# Patient Record
Sex: Female | Born: 1945 | Race: White | Hispanic: No | Marital: Single | Smoking: Never smoker
Health system: Southern US, Community
[De-identification: ages and names within clinical notes are randomized; demographics above are authoritative.]

## PROBLEM LIST (undated history)

## (undated) DIAGNOSIS — I4891 Unspecified atrial fibrillation: Secondary | ICD-10-CM

## (undated) DIAGNOSIS — C801 Malignant (primary) neoplasm, unspecified: Secondary | ICD-10-CM

## (undated) DIAGNOSIS — E7800 Pure hypercholesterolemia, unspecified: Secondary | ICD-10-CM

## (undated) DIAGNOSIS — K219 Gastro-esophageal reflux disease without esophagitis: Secondary | ICD-10-CM

## (undated) DIAGNOSIS — G2581 Restless legs syndrome: Secondary | ICD-10-CM

## (undated) DIAGNOSIS — N83209 Unspecified ovarian cyst, unspecified side: Secondary | ICD-10-CM

## (undated) DIAGNOSIS — Z9889 Other specified postprocedural states: Secondary | ICD-10-CM

## (undated) DIAGNOSIS — Z923 Personal history of irradiation: Secondary | ICD-10-CM

## (undated) DIAGNOSIS — R112 Nausea with vomiting, unspecified: Secondary | ICD-10-CM

## (undated) DIAGNOSIS — E079 Disorder of thyroid, unspecified: Secondary | ICD-10-CM

## (undated) DIAGNOSIS — I10 Essential (primary) hypertension: Secondary | ICD-10-CM

## (undated) DIAGNOSIS — F419 Anxiety disorder, unspecified: Secondary | ICD-10-CM

## (undated) HISTORY — DX: Disorder of thyroid, unspecified: E07.9

## (undated) HISTORY — PX: APPENDECTOMY: SHX54

## (undated) HISTORY — PX: TONSILLECTOMY: SUR1361

## (undated) HISTORY — DX: Unspecified ovarian cyst, unspecified side: N83.209

## (undated) HISTORY — DX: Unspecified atrial fibrillation: I48.91

## (undated) HISTORY — PX: OOPHORECTOMY: SHX86

---

## 1986-05-16 HISTORY — PX: TOTAL ABDOMINAL HYSTERECTOMY: SHX209

## 1999-03-05 ENCOUNTER — Encounter: Admission: RE | Admit: 1999-03-05 | Discharge: 1999-03-05 | Payer: Self-pay | Admitting: Obstetrics and Gynecology

## 1999-03-05 ENCOUNTER — Encounter: Payer: Self-pay | Admitting: Obstetrics and Gynecology

## 2000-03-06 ENCOUNTER — Encounter: Payer: Self-pay | Admitting: Obstetrics and Gynecology

## 2000-03-06 ENCOUNTER — Encounter: Admission: RE | Admit: 2000-03-06 | Discharge: 2000-03-06 | Payer: Self-pay | Admitting: Obstetrics and Gynecology

## 2000-12-04 ENCOUNTER — Ambulatory Visit (HOSPITAL_COMMUNITY): Admission: RE | Admit: 2000-12-04 | Discharge: 2000-12-04 | Payer: Self-pay | Admitting: Gastroenterology

## 2000-12-04 ENCOUNTER — Encounter (INDEPENDENT_AMBULATORY_CARE_PROVIDER_SITE_OTHER): Payer: Self-pay | Admitting: *Deleted

## 2001-04-05 ENCOUNTER — Encounter: Admission: RE | Admit: 2001-04-05 | Discharge: 2001-04-05 | Payer: Self-pay | Admitting: Obstetrics and Gynecology

## 2001-04-05 ENCOUNTER — Encounter: Payer: Self-pay | Admitting: Obstetrics and Gynecology

## 2001-09-05 ENCOUNTER — Encounter: Admission: RE | Admit: 2001-09-05 | Discharge: 2001-09-05 | Payer: Self-pay | Admitting: Surgery

## 2001-09-05 ENCOUNTER — Encounter: Payer: Self-pay | Admitting: Surgery

## 2002-08-21 ENCOUNTER — Encounter: Payer: Self-pay | Admitting: Obstetrics and Gynecology

## 2002-08-21 ENCOUNTER — Encounter: Admission: RE | Admit: 2002-08-21 | Discharge: 2002-08-21 | Payer: Self-pay | Admitting: Obstetrics and Gynecology

## 2003-09-25 ENCOUNTER — Ambulatory Visit (HOSPITAL_COMMUNITY): Admission: RE | Admit: 2003-09-25 | Discharge: 2003-09-25 | Payer: Self-pay | Admitting: Family Medicine

## 2003-10-08 ENCOUNTER — Encounter (HOSPITAL_COMMUNITY): Admission: RE | Admit: 2003-10-08 | Discharge: 2003-10-09 | Payer: Self-pay | Admitting: Family Medicine

## 2004-05-06 ENCOUNTER — Encounter: Admission: RE | Admit: 2004-05-06 | Discharge: 2004-05-06 | Payer: Self-pay | Admitting: Obstetrics and Gynecology

## 2005-11-08 ENCOUNTER — Encounter: Admission: RE | Admit: 2005-11-08 | Discharge: 2005-11-08 | Payer: Self-pay | Admitting: Obstetrics and Gynecology

## 2006-12-04 ENCOUNTER — Encounter: Admission: RE | Admit: 2006-12-04 | Discharge: 2006-12-04 | Payer: Self-pay | Admitting: Obstetrics and Gynecology

## 2007-05-17 HISTORY — PX: CHOLECYSTECTOMY: SHX55

## 2007-09-14 ENCOUNTER — Emergency Department (HOSPITAL_COMMUNITY): Admission: EM | Admit: 2007-09-14 | Discharge: 2007-09-14 | Payer: Self-pay | Admitting: Emergency Medicine

## 2007-11-09 ENCOUNTER — Ambulatory Visit (HOSPITAL_COMMUNITY): Admission: RE | Admit: 2007-11-09 | Discharge: 2007-11-09 | Payer: Self-pay | Admitting: Surgery

## 2007-11-09 ENCOUNTER — Encounter (INDEPENDENT_AMBULATORY_CARE_PROVIDER_SITE_OTHER): Payer: Self-pay | Admitting: Surgery

## 2008-01-31 ENCOUNTER — Ambulatory Visit (HOSPITAL_COMMUNITY): Admission: RE | Admit: 2008-01-31 | Discharge: 2008-01-31 | Payer: Self-pay | Admitting: Family Medicine

## 2008-02-12 ENCOUNTER — Encounter: Admission: RE | Admit: 2008-02-12 | Discharge: 2008-02-12 | Payer: Self-pay | Admitting: Family Medicine

## 2008-03-27 ENCOUNTER — Ambulatory Visit (HOSPITAL_COMMUNITY): Admission: RE | Admit: 2008-03-27 | Discharge: 2008-03-27 | Payer: Self-pay | Admitting: Family Medicine

## 2008-09-01 ENCOUNTER — Ambulatory Visit (HOSPITAL_COMMUNITY): Admission: RE | Admit: 2008-09-01 | Discharge: 2008-09-01 | Payer: Self-pay | Admitting: Family Medicine

## 2009-03-05 ENCOUNTER — Ambulatory Visit (HOSPITAL_COMMUNITY): Admission: RE | Admit: 2009-03-05 | Discharge: 2009-03-05 | Payer: Self-pay | Admitting: Family Medicine

## 2009-03-16 ENCOUNTER — Encounter (HOSPITAL_COMMUNITY): Admission: RE | Admit: 2009-03-16 | Discharge: 2009-04-15 | Payer: Self-pay | Admitting: Orthopedic Surgery

## 2009-03-31 ENCOUNTER — Encounter: Admission: RE | Admit: 2009-03-31 | Discharge: 2009-03-31 | Payer: Self-pay | Admitting: Obstetrics and Gynecology

## 2009-04-16 ENCOUNTER — Encounter (HOSPITAL_COMMUNITY): Admission: RE | Admit: 2009-04-16 | Discharge: 2009-05-13 | Payer: Self-pay | Admitting: Orthopedic Surgery

## 2009-05-18 ENCOUNTER — Encounter (HOSPITAL_COMMUNITY): Admission: RE | Admit: 2009-05-18 | Discharge: 2009-06-17 | Payer: Self-pay | Admitting: Orthopedic Surgery

## 2010-04-01 ENCOUNTER — Encounter: Admission: RE | Admit: 2010-04-01 | Discharge: 2010-04-01 | Payer: Self-pay | Admitting: Obstetrics and Gynecology

## 2010-09-28 NOTE — Consult Note (Signed)
[name], [name] NO.:  [id number]   MEDICAL RECORD NO.:  [id number]          PATIENT TYPE:  EMS   LOCATION:  MAJO                         FACILITY:  [facility]   PHYSICIAN:  Gabrielle Dare. Janee Morn, M.D.DATE OF BIRTH:  08/03/1945   DATE OF CONSULTATION:  09/14/2007  DATE OF DISCHARGE:  09/14/2007                                 CONSULTATION   REASON FOR CONSULTATION:  Gallstone.   HISTORY OF PRESENT ILLNESS:  [name] is a very pleasant 65 year old  white female who is well known to Dr. Cyndia Bent from our practice  with a history of gallstones for about several years.  She developed  right lower quadrant abdominal pain earlier this morning.  It resolved  around at the time of her arrival to the hospital, but she underwent  further evaluation.  Ultrasound showed some gallstones and some  gallbladder wall thickening, and we are asked to evaluate her.  Since  that time, she has had no recurrence of her pain or whatsoever, and she  is hoping to be able to go home.   PAST MEDICAL HISTORY:  Hypothyroidism.   PAST SURGICAL HISTORY:  Hysterectomy and tonsillectomy.   SOCIAL HISTORY:  She does not smoke.   ALLERGIES:  No known drug allergies.   MEDICATIONS:  Synthroid, Premarin, and vitamins.   REVIEW OF SYSTEMS:  GI:  Currently is negative, previously she had pain  in the right lower quadrant and the subcostal region.  CARDIAC:  Negative.  PULMONARY:  Negative.  GU:  Negative.  MUSCULOSKELETAL:  Negative.  NEUROPSYCHIATRIC:  Negative.  Remainder of the reviews systems are unremarkable.   PHYSICAL EXAMINATION:  VITAL SIGNS:  Temperature 97.3, pulse 80,  respirations 16, and blood pressure 119/70.  GENERAL:  She is awake and alert.  She appears well.  She is in no  distress.  HEENT:  Pupils are equal.  Sclerae clear with no icterus.  Oral mucosa  is moist.  NECK:  Supple with no tenderness or masses felt.  LUNGS:  Clear to auscultation.  No wheezing is  heard.  CARDIAC:  Heart is regular.  No murmurs are present, impulses are  palpable on left chest.  ABDOMEN:  Soft and nontender.  There is no right upper quadrant  tenderness whatsoever.  No masses are felt.  Bowel sounds are active.  EXTREMITIES:  Warm with no peripheral edema.  SKIN:  Dry with no rashes.  NEUROLOGIC:  The patient follows commands and moves all extremities  without noted deficits.   DATA REVIEWED:  Ultrasound with results as above.  Urinalysis is  negative.  Hemoglobin 12.9, and white blood cell count 12.7.  Basic  metabolic profile unremarkable with exception of glucose of 116, AST  109, ALT 47, alkaline phosphatase 51, and bilirubin 0.7.   IMPRESSION:  Likely, biliary colic with pain now completely resolved.  The patient wants to go home.  I feel this will be safe.   PLAN:  The plan will be for her to be on the low-fat diet, and she will  make an appointment to follow up with Dr. Jamey Ripa next week.  I gave her  a card, and she has agreed to call us, if any of the pain returns  tonight or in the interim before she sees Dr. Jamey Ripa.  I feel she will  need to go on and have an elective cholecystectomy.  Plan was discussed  in detail with the patient.  Questions were answered.      Gabrielle Dare Janee Morn, M.D.  Electronically Signed     BET/MEDQ  D:  09/14/2007  T:  09/15/2007  Job:  [id number]

## 2010-09-28 NOTE — Op Note (Signed)
NAMENIKKY, DUBA NO.:  [id number]   MEDICAL RECORD NO.:  [id number]          PATIENT TYPE:  AMB   LOCATION:  DAY                          FACILITY:  [facility]   PHYSICIAN:  Currie Paris, M.D.DATE OF BIRTH:  [birth year]   DATE OF PROCEDURE:  11/09/2007  DATE OF DISCHARGE:                               OPERATIVE REPORT   OFFICE MEDICAL RECORD NUMBER:  [id number].   PREOPERATIVE DIAGNOSIS:  Chronic calculus cholecystitis.   POSTOPERATIVE DIAGNOSIS:  Chronic calculus cholecystitis.   OPERATION:  Laparoscopic cholecystectomy, with operative cholangiogram.   SURGEON:  Currie Paris, M.D.   ASSISTANT:  Angelia Mould. Derrell Lolling, M.D.   ANESTHESIA:  General endotracheal.   CLINICAL HISTORY:  This is a 65 year old lady with biliary type symptoms  and multiple known gallstones.  She elected to proceed to  cholecystectomy.   DESCRIPTION OF PROCEDURE:  The patient was seen in the holding area, and  she had no further questions.  We confirmed that cholecystectomy was the  planned procedure.   The patient was taken to the operating room, and after satisfactory  general endotracheal anesthesia had been obtained, the abdomen was  prepped and draped.  The time-out was performed.   I used 0.25% plain Marcaine for each of the incisions.  The umbilical  incision was made, the fascia identified and opened, and the peritoneal  cavity entered under direct vision.  A pursestring was placed, the  Hasson introduced, and the abdomen insufflated to 15.   The patient was placed in reverse Trendelenburg and tilted to the left.  The epigastric trocar using a 10/11 and two 5 mm trocar skin for the  lateral trocars were placed under direct vision.   The gallbladder was contracted around what appeared to be some large  stones.  It was retracted over the liver, and the peritoneum around the  triangle of Calot opened, and I made a large window and identified a  long segment of  cystic duct with its junction with the gallbladder as  well as a segment of the cystic artery.  The cystic duct and artery were  clipped and the cystic duct opened near the junction with the  gallbladder.  A Cook catheter was introduced for cholangiography.  While  waiting for x-ray, I went ahead and divided the artery, and I began to  remove the gallbladder, and we had a nice plane of dissection with  almost a mesentery.  Once radiology came in, we went ahead with  operative cholangiogram, and this appeared to be basically normal, with  good flow the duodenum, good filling of the hepatic radicals, and no  filling defects.  There did appear to be a couple of slightly narrowed  areas, but there was no tapering to suggest a tumor or malignant  obstruction.  I reviewed those with the radiologist verbally.   The cystic catheter was then removed, and three clips placed on the stay  side of the cystic duct.  It was divided.  The gallbladder was then  completely dissected off and removed from its bed and placed in the bag.  I  irrigated it to make sure everything was dry.  The gallbladder was  brought out the umbilical port.  I made a final irrigation, and then we  closed the umbilical port with a pursestring.  The abdomen was deflated  through the epigastric port after removing the two lateral trocars.  The  skin was closed with 4-0 Monocryl subcuticular plus Dermabond.   The patient tolerated the procedure well, and there were no operative  complications.  All counts were correct.      Currie Paris, M.D.  Electronically Signed     CJS/MEDQ  D:  11/09/2007  T:  11/09/2007  Job:  [id number]   cc:   Patrica Duel, M.D.  Fax: [phone]

## 2010-10-01 NOTE — Procedures (Signed)
[place]  Patient:    [name], [name]                     MRN: [id number] Proc. Date: 12/04/00 Attending:  Petra Kuba, M.D. CC:         Patrica Duel, MD  S. Kyra Manges, M.D.   Procedure Report  PROCEDURE PERFORMED:  Colonoscopy with polypectomy.  ENDOSCOPIST:  Petra Kuba, M.D.  INDICATIONS FOR PROCEDURE:  Colonic screening.  Consent was signed after risks, benefits, methods, and options were thoroughly discussed in the office.  MEDICATIONS USED:  Demerol 55 mg, Versed 7 mg.  DESCRIPTION OF PROCEDURE:  Rectal inspection was pertinent for small external hemorrhoids.  Digital exam was negative.  Pediatric video colonoscope was inserted and fairly easily advanced around the colon to the cecum.  This did require rolling her on her back and some abdominal pressure.  On insertion, other than some left-sided  diverticula, no abnormalities were seen.  The cecum was identified by the appendiceal orifice and the ileocecal valve.  The scope was slowly withdrawn.  The prep was adequate there was some liquid stool that require washing and suctioning.  On slow withdrawal through the colon the cecum was normal.  In the more distal ascending, a small 2 mm polyp was seen and was hot biopsied x 1.  The scope was further withdrawn.  No other abnormalities were seen other than some left-sided diverticula as we slowly withdrew back to the rectum.  Once back in the rectum, the scope was retroflexed pertinent for some internal hemorrhoids.  The scope was straightened and readvanced a short ways up the sigmoid.  Air was suctioned, scope removed.  The patient tolerated the procedure well.  There was no obvious immediate complication.  ENDOSCOPIC DIAGNOSIS: 1. Internal and external hemorrhoids. 2. Left-sided diverticula. 3. Once small ascending polyp hot biopsied. 4. Otherwise within normal limits to the cecum.  PLAN:  Await pathology but probably  recheck colon screening in five years.  GI follow-up p.r.n. otherwise return care to Dr. Nobie Putnam and Elana Alm for the customary health care maintenance to include yearly rectals and guaiacs. DD:  12/04/00 TD:  12/04/00 Job: [id number]

## 2011-02-10 LAB — DIFFERENTIAL
Basophils Absolute: 0
Basophils Relative: 0
Lymphs Abs: 1.4
Monocytes Relative: 9
Neutrophils Relative %: 61

## 2011-02-10 LAB — URINALYSIS, ROUTINE W REFLEX MICROSCOPIC
Bilirubin Urine: NEGATIVE
Ketones, ur: NEGATIVE
Nitrite: NEGATIVE
Protein, ur: NEGATIVE
Specific Gravity, Urine: 1.005
pH: 6.5

## 2011-02-10 LAB — COMPREHENSIVE METABOLIC PANEL
AST: 23
Chloride: 106
Creatinine, Ser: 0.79
GFR calc Af Amer: >60
GFR calc non Af Amer: >60
Potassium: 3.7
Total Bilirubin: 0.8

## 2011-02-10 LAB — CBC
HCT: 35 — ABNORMAL LOW
Platelets: 214
RDW: 12.4
WBC: 4.8

## 2011-03-10 ENCOUNTER — Other Ambulatory Visit: Payer: Self-pay | Admitting: Internal Medicine

## 2011-03-10 DIAGNOSIS — Z1231 Encounter for screening mammogram for malignant neoplasm of breast: Secondary | ICD-10-CM

## 2011-03-12 ENCOUNTER — Encounter: Payer: Self-pay | Admitting: *Deleted

## 2011-03-12 ENCOUNTER — Emergency Department (HOSPITAL_COMMUNITY)
Admission: EM | Admit: 2011-03-12 | Discharge: 2011-03-12 | Disposition: A | Discharge status: Home / Self Care | Payer: BC Managed Care – PPO | Attending: Emergency Medicine | Admitting: Emergency Medicine

## 2011-03-12 DIAGNOSIS — S91309A Unspecified open wound, unspecified foot, initial encounter: Secondary | ICD-10-CM | POA: Insufficient documentation

## 2011-03-12 DIAGNOSIS — S2000XA Contusion of breast, unspecified breast, initial encounter: Secondary | ICD-10-CM

## 2011-03-12 DIAGNOSIS — S91319A Laceration without foreign body, unspecified foot, initial encounter: Secondary | ICD-10-CM

## 2011-03-12 DIAGNOSIS — W01119A Fall on same level from slipping, tripping and stumbling with subsequent striking against unspecified sharp object, initial encounter: Secondary | ICD-10-CM | POA: Insufficient documentation

## 2011-03-12 DIAGNOSIS — W268XXA Contact with other sharp object(s), not elsewhere classified, initial encounter: Secondary | ICD-10-CM | POA: Insufficient documentation

## 2011-03-12 MED ORDER — LIDOCAINE HCL (PF) 1 % IJ SOLN
INTRAMUSCULAR | Status: AC
  Filled 2011-03-12: qty 5

## 2011-03-12 MED ORDER — LIDOCAINE-EPINEPHRINE 2 %-1:100000 IJ SOLN: 20.0000 mL | Freq: Once | INTRAMUSCULAR | Status: DC

## 2011-03-12 MED ORDER — LIDOCAINE-EPINEPHRINE (PF) 1 %-1:200000 IJ SOLN
INTRAMUSCULAR | Status: AC
  Administered 2011-03-12: 17:00:00
  Filled 2011-03-12: qty 10

## 2011-03-12 NOTE — ED Notes (Signed)
Dressing applied. 

## 2011-03-12 NOTE — ED Notes (Signed)
Pt states she cut her foot on the metal of the floor vent in her house.

## 2011-03-12 NOTE — ED Provider Notes (Signed)
Scribed for [name], MD, the patient was seen in room APA10/APA10 . This chart was scribed by Ellie Lunch.   CSN: [id number] Arrival date & time: 03/12/2011  3:53 PM   First MD Initiated Contact with Patient 03/12/11 1607      Chief Complaint  Patient presents with  . Extremity Laceration    (Consider location/radiation/quality/duration/timing/severity/associated sxs/prior treatment) HPI [name] is a 65 y.o. female who presents to the Emergency Department complaining of left foot laceration. Pt reports she was working on her floors when she accidentally stepped into an uncovered floor vent ~ 2 hours ago. Pt says she cut the bottom of her left foot on the metal part of the floor vent when she slipped. Pt rates pain at site of laceration 2/10 in severity. Pt denies any pain to ankle, knee, back. Denies any weakness or dizziness.  Last tetanus ~ 3 years ago.   History reviewed. No pertinent past medical history.  Past Surgical History  Procedure Date  . Abdominal hysterectomy   . Tonsillectomy   . Cholecystectomy     History reviewed. No pertinent family history.  History  Substance Use Topics  . Smoking status: Never Smoker   . Smokeless tobacco: Not on file  . Alcohol Use: No    Review of Systems  HENT: Negative for neck pain.   Musculoskeletal: Negative for back pain.  Skin: Positive for wound (laceration to bottom of left foot).  Neurological: Negative for dizziness and weakness.  All other systems reviewed and are negative.   Allergies  Review of patient's allergies indicates no known allergies.  Home Medications   Current Outpatient Rx  Name Route Sig Dispense Refill  . CALCIUM CARBONATE-VITAMIN D 500-200 MG-UNIT PO TABS Oral Take 1 tablet by mouth daily.      Marland Kitchen ESTROGENS CONJUGATED 0.625 MG PO TABS Oral Take 0.625 mg by mouth every other day.      Marland Kitchen LEVOTHYROXINE SODIUM 75 MCG PO TABS Oral Take 75 mcg by mouth daily.      .  MULTI-VITAMIN/MINERALS PO TABS Oral Take 1 tablet by mouth daily.      Marland Kitchen FISH OIL 1200 MG PO CAPS Oral Take 1 capsule by mouth daily.        BP 141/50  Pulse 112  Temp(Src) 98.2 F (36.8 C) (Oral)  Resp 20  Ht 5\' 7"  (1.702 m)  Wt 130 lb (58.968 kg)  BMI 20.36 kg/m2  SpO2 100%  Physical Exam  Nursing note and vitals reviewed. Constitutional: She is oriented to person, place, and time. She appears well-developed and well-nourished.  HENT:  Head: Normocephalic and atraumatic.  Eyes: Conjunctivae and EOM are normal.  Neck: Normal range of motion. Neck supple.  Cardiovascular: Regular rhythm and normal heart sounds.        Borderline tachycardia  Pulmonary/Chest: Effort normal and breath sounds normal. No respiratory distress. She exhibits no tenderness.       No bruising, abrasion, or nipple drainage on  right breast.  No deformity noted to right posterior ribs.   Abdominal: Soft. There is no tenderness.  Musculoskeletal: Normal range of motion. She exhibits no tenderness.       Spine non tender. Full ROM BLE.  3.5 cm laceration to plantar aspect of left foot between 1st metacarpal and phalangeal joint.  Neurological: She is alert and oriented to person, place, and time.  Skin: Skin is warm and dry.  Psychiatric: She has a normal mood and affect.  ED Course  Procedures (including critical care time)  LACERATION REPAIR PROCEDURE NOTE The patient's identification was confirmed and consent was obtained. This procedure was performed by [name], MD at 4:22 PM. Site: plantar surface of left foot Sterile procedures observed Anesthetic used (type and amt): 3 ml Lidocaine-Epinephrine 1% Suture type/size: 4.0 Prolene Length:3.5cm # of Sutures: 3 Technique:interrupted  Complexity simple Antibx ointment applied Tetanus UTD Site anesthetized, irrigated with NS, explored without evidence of foreign body, wound well approximated, site covered with dry, sterile dressing.   Patient tolerated procedure well without complications. Instructions for care discussed verbally and patient provided with additional written instructions for homecare and f/u.   OTHER DATA REVIEWED: Nursing notes, vital signs reviewed.  DIAGNOSTIC STUDIES: Oxygen Saturation is 100% on room air, normal by my interpretation.    ED MEDICATIONS Medications  lidocaine-EPINEPHrine (XYLOCAINE-EPINEPHrine) 1 %-1:200000 (with pres) injection (   Given by Other 03/12/11 1630)   4:22 EDP at PT bedside to suture laceration. EDP collected additional history. Pt states she hit her right breast when she slipped into the vent. EDP examined right breast and found no bruising, abrasion, or nipple drainage from right breast. No deformity noted to ribs.   1. Laceration of foot   2. Contusion of breast      MDM  Accidental fall with contusion to right breast and laceration left foot. No serious injury.   I personally performed the services described in this documentation, which was scribed in my presence. The recorded information has been reviewed and considered.         [name], MD 03/12/11 [id number]

## 2011-04-05 ENCOUNTER — Ambulatory Visit
Admission: RE | Admit: 2011-04-05 | Discharge: 2011-04-05 | Disposition: A | Discharge status: Home / Self Care | Payer: BC Managed Care – PPO | Source: Ambulatory Visit | Attending: Internal Medicine | Admitting: Internal Medicine

## 2011-04-05 DIAGNOSIS — Z1231 Encounter for screening mammogram for malignant neoplasm of breast: Secondary | ICD-10-CM

## 2011-06-16 DIAGNOSIS — E049 Nontoxic goiter, unspecified: Secondary | ICD-10-CM | POA: Diagnosis not present

## 2011-09-15 DIAGNOSIS — E049 Nontoxic goiter, unspecified: Secondary | ICD-10-CM | POA: Diagnosis not present

## 2011-09-16 ENCOUNTER — Ambulatory Visit (INDEPENDENT_AMBULATORY_CARE_PROVIDER_SITE_OTHER): Payer: Medicare Other | Admitting: Gynecology

## 2011-09-16 ENCOUNTER — Encounter: Payer: Self-pay | Admitting: Gynecology

## 2011-09-16 VITALS — BP 130/80 | Ht 67.5 in | Wt 128.0 lb

## 2011-09-16 DIAGNOSIS — Z79890 Hormone replacement therapy (postmenopausal): Secondary | ICD-10-CM | POA: Diagnosis not present

## 2011-09-16 DIAGNOSIS — R82998 Other abnormal findings in urine: Secondary | ICD-10-CM | POA: Diagnosis not present

## 2011-09-16 DIAGNOSIS — E039 Hypothyroidism, unspecified: Secondary | ICD-10-CM | POA: Insufficient documentation

## 2011-09-16 DIAGNOSIS — N952 Postmenopausal atrophic vaginitis: Secondary | ICD-10-CM

## 2011-09-16 MED ORDER — FLUCONAZOLE 150 MG PO TABS: 150.0000 mg | ORAL_TABLET | Freq: Once | ORAL | Status: AC

## 2011-09-16 MED ORDER — ESTROGENS CONJUGATED 0.625 MG PO TABS: 0.6250 mg | ORAL_TABLET | ORAL | Status: DC

## 2011-09-16 NOTE — Patient Instructions (Signed)
Follow up in one year for annual exam 

## 2011-09-16 NOTE — Progress Notes (Signed)
AGAPE HARDIMAN 05/09/1946 [id number]        66 y.o.  G0 new patient for follow up. Several issues noted below. Former patient of Dr. Leota Sauers.  Past medical history,surgical history, medications, allergies, family history and social history were all reviewed and documented in the EPIC chart. ROS:  Was performed and pertinent positives and negatives are included in the history.  Exam: Kim chaperone present Filed Vitals:   09/16/11 1008  BP: 130/80   General appearance  Normal Skin grossly normal Head/Neck normal with no cervical or supraclavicular adenopathy thyroid normal Lungs  clear Cardiac RR, without RMG Abdominal  soft, nontender, without masses, organomegaly or hernia Breasts  examined lying and sitting without masses, retractions, discharge or axillary adenopathy. Pelvic  Ext/BUS/vagina  normal with atrophic genital changes. Admits one finger.  Adnexa  Without masses or tenderness    Anus and perineum  normal   Rectovaginal  normal sphincter tone without palpated masses or tenderness.    Assessment/Plan:  66 y.o. female for annual exam.    1. ERT. Patient is on Premarin 0.625 taking it every other day. She actually had been off of it for the past week as she ran out and is doing well. She was started after her hysterectomy at age 6 has continued since then.  I reviewed the WHI study, increased risk of stroke heart attack DVT possible increased risk of breast cancer. The ACOG and NAMS statements for the lowest dose for the shortest period of time discussed. The advantages of transdermal/first pass effect issues reviewed. After lengthy discussion the patient is going to remain off of this but she did ask if I could refill it for the year just in case she would develop symptoms and want to reinitiate accepted the above risks. I refilled her Premarin 0.625 times a year. 2. Atrophic vaginitis. Patient does have fair amount of atrophic changes. Her vagina is somewhat  restrictive admitting one finger. This is not an issue with her and we'll continue to monitor. 3. Pap smear. No Pap smear was done today. She has no history of significant abnormal Pap smears with last Pap smear done January 2012. She's going to bring me records next year for me to review but at this point we'll plan on stopping Pap smears. 4. DEXA. Patient had a DEXA historically 3 years ago and states it was normal. She'll plan on repeating this a 5 year interval. Increase calcium vitamin D reviewed. 5. Colonoscopy. Patient scheduled for colonoscopy this coming June and will follow up for this. 6. Mammography. Patient had her mammography November 2012. She'll continue with annual mammography. SBE monthly reviewed.  7. Diflucan. Patient does get occasional yeast infections following antibiotic use. She asked if I could give her a prescription for Diflucan as Dr. Lelon Perla date and I wrote for Diflucan 150 mg #1 with 2 refills. 8. Health maintenance. No blood work was done today this was all done through her primary physician's office. Assuming she continues well she'll see me in a year, sooner as needed.   Dara Lords MD, 10:49 AM 09/16/2011

## 2011-09-17 LAB — URINALYSIS W MICROSCOPIC + REFLEX CULTURE
Bacteria, UA: NONE SEEN
Bilirubin Urine: NEGATIVE
Casts: NONE SEEN
Crystals: NONE SEEN
Ketones, ur: NEGATIVE mg/dL
Specific Gravity, Urine: 1.007 (ref 1.005–1.030)
Urobilinogen, UA: 0.2 mg/dL (ref 0.0–1.0)

## 2011-09-18 LAB — URINE CULTURE: Colony Count: 4000

## 2011-11-03 DIAGNOSIS — Z09 Encounter for follow-up examination after completed treatment for conditions other than malignant neoplasm: Secondary | ICD-10-CM | POA: Diagnosis not present

## 2011-11-03 DIAGNOSIS — K5730 Diverticulosis of large intestine without perforation or abscess without bleeding: Secondary | ICD-10-CM | POA: Diagnosis not present

## 2011-11-03 DIAGNOSIS — Z86010 Personal history of colonic polyps: Secondary | ICD-10-CM | POA: Diagnosis not present

## 2012-01-12 DIAGNOSIS — E040 Nontoxic diffuse goiter: Secondary | ICD-10-CM | POA: Diagnosis not present

## 2012-02-28 ENCOUNTER — Other Ambulatory Visit: Payer: Self-pay | Admitting: Internal Medicine

## 2012-02-28 DIAGNOSIS — Z1231 Encounter for screening mammogram for malignant neoplasm of breast: Secondary | ICD-10-CM

## 2012-04-10 ENCOUNTER — Ambulatory Visit
Admission: RE | Admit: 2012-04-10 | Discharge: 2012-04-10 | Disposition: A | Discharge status: Home / Self Care | Payer: 59 | Source: Ambulatory Visit | Attending: Internal Medicine | Admitting: Internal Medicine

## 2012-04-10 DIAGNOSIS — Z1231 Encounter for screening mammogram for malignant neoplasm of breast: Secondary | ICD-10-CM

## 2012-04-19 DIAGNOSIS — R5383 Other fatigue: Secondary | ICD-10-CM | POA: Diagnosis not present

## 2012-04-19 DIAGNOSIS — R5381 Other malaise: Secondary | ICD-10-CM | POA: Diagnosis not present

## 2012-04-19 DIAGNOSIS — E040 Nontoxic diffuse goiter: Secondary | ICD-10-CM | POA: Diagnosis not present

## 2012-04-19 DIAGNOSIS — E7800 Pure hypercholesterolemia, unspecified: Secondary | ICD-10-CM | POA: Diagnosis not present

## 2012-05-02 DIAGNOSIS — H25099 Other age-related incipient cataract, unspecified eye: Secondary | ICD-10-CM | POA: Diagnosis not present

## 2012-07-26 DIAGNOSIS — E040 Nontoxic diffuse goiter: Secondary | ICD-10-CM | POA: Diagnosis not present

## 2012-09-21 ENCOUNTER — Ambulatory Visit (INDEPENDENT_AMBULATORY_CARE_PROVIDER_SITE_OTHER): Payer: Medicare Other | Admitting: Gynecology

## 2012-09-21 ENCOUNTER — Encounter: Payer: Self-pay | Admitting: Gynecology

## 2012-09-21 VITALS — BP 114/66 | Ht 66.0 in | Wt 132.0 lb

## 2012-09-21 DIAGNOSIS — N951 Menopausal and female climacteric states: Secondary | ICD-10-CM

## 2012-09-21 DIAGNOSIS — Z79890 Hormone replacement therapy (postmenopausal): Secondary | ICD-10-CM

## 2012-09-21 DIAGNOSIS — N952 Postmenopausal atrophic vaginitis: Secondary | ICD-10-CM | POA: Diagnosis not present

## 2012-09-21 MED ORDER — FLUCONAZOLE 150 MG PO TABS: 150.0000 mg | ORAL_TABLET | Freq: Once | ORAL | Status: DC

## 2012-09-21 MED ORDER — ESTROGENS CONJUGATED 0.625 MG PO TABS: 0.6250 mg | ORAL_TABLET | ORAL | Status: DC

## 2012-09-21 NOTE — Progress Notes (Signed)
[name] Nov 24, 1945 [id number]        67 y.o.  G0P0 for followup exam.  Several issues noted below.  Past medical history,surgical history, medications, allergies, family history and social history were all reviewed and documented in the EPIC chart. ROS:  Was performed and pertinent positives and negatives are included in the history.  Exam: Kim assistant Filed Vitals:   09/21/12 1153  BP: 114/66  Height: 5\' 6"  (1.676 m)  Weight: 132 lb (59.875 kg)   General appearance  Normal Skin grossly normal Head/Neck normal with no cervical or supraclavicular adenopathy thyroid normal Lungs  clear Cardiac RR, without RMG Abdominal  soft, nontender, without masses, organomegaly or hernia Breasts  examined lying and sitting without masses, retractions, discharge or axillary adenopathy. Pelvic  Ext/BUS/vagina  normal with atrophic changes  Adnexa  Without masses or tenderness    Anus and perineum  normal   Rectovaginal  normal sphincter tone without palpated masses or tenderness.    Assessment/Plan:  67 y.o. G0P0 female for followup exam.   1. ERT. Patient taking Premarin 0.625 mg 3 times weekly. Has tried stopping with unacceptable hot flashes. I again discussed the WHI study with increased risk of stroke heart attack DVT and possible breast cancer. The ACOG and NAMS statements per lowest dose for shortess period of time reviewed. Patient understands the issues accepts the risks and wants to continue and I refilled her times a year. 2. Genital atrophy. Patient does have a somewhat restricted vaginal canal that admits one finger in width, normal depth. This is not an issue to her and we'll continue to observe. 3. Vaginitis. Patient does get occasional yeast infections and uses Diflucan 150 mg tablet when necessary. I wrote her for one tablet with 2 refills to use as necessary. 4. Pap smear 2012. No Pap smear done today. No history of significant abnormal Pap smears. Patient is status  post hysterectomy for benign indications and over the age of 17. The options to stop screening altogether versus less frequent screening intervals reviewed. Will readdress on an annual basis. 5. Mammography 03/2012. Patient encouraged to schedule this fall. SBE monthly reviewed. 6. DEXA 3 years ago reported normal. I never got a report of this but she remembers being told it was normal. Plan repeat in another 1-2 years. Increase calcium vitamin D reviewed. 7. Colonoscopy 2013. Followup with their recommended interval. 8. Health maintenance. No blood work done today as it's all done through her primary physician's office. Followup one year, sooner as needed.    Dara Lords MD, 12:22 PM 09/21/2012

## 2012-09-21 NOTE — Patient Instructions (Addendum)
Follow up in one year for annual exam 

## 2012-09-22 LAB — URINALYSIS W MICROSCOPIC + REFLEX CULTURE
Bacteria, UA: NONE SEEN
Bilirubin Urine: NEGATIVE
Glucose, UA: NEGATIVE mg/dL
Protein, ur: NEGATIVE mg/dL
Urobilinogen, UA: 0.2 mg/dL (ref 0.0–1.0)

## 2012-11-01 DIAGNOSIS — E040 Nontoxic diffuse goiter: Secondary | ICD-10-CM | POA: Diagnosis not present

## 2012-11-03 DIAGNOSIS — E039 Hypothyroidism, unspecified: Secondary | ICD-10-CM | POA: Diagnosis not present

## 2012-11-03 DIAGNOSIS — IMO0002 Reserved for concepts with insufficient information to code with codable children: Secondary | ICD-10-CM | POA: Diagnosis not present

## 2012-11-03 DIAGNOSIS — G2581 Restless legs syndrome: Secondary | ICD-10-CM | POA: Diagnosis not present

## 2012-11-08 ENCOUNTER — Telehealth: Payer: Self-pay | Admitting: *Deleted

## 2012-11-08 NOTE — Telephone Encounter (Signed)
Pt never picked up rx given on 09/21/12 premarin 0.625 mg and diflucan 150 mg tablet. Both rx called in I spoke with [facility] pharmacist. I informed pt rx will be ready. If Rx are picked up within a 1 week the pharmacy will put medication back.

## 2012-11-22 DIAGNOSIS — L909 Atrophic disorder of skin, unspecified: Secondary | ICD-10-CM | POA: Diagnosis not present

## 2012-11-22 DIAGNOSIS — L821 Other seborrheic keratosis: Secondary | ICD-10-CM | POA: Diagnosis not present

## 2012-11-22 DIAGNOSIS — B009 Herpesviral infection, unspecified: Secondary | ICD-10-CM | POA: Diagnosis not present

## 2012-11-22 DIAGNOSIS — L919 Hypertrophic disorder of the skin, unspecified: Secondary | ICD-10-CM | POA: Diagnosis not present

## 2012-11-22 DIAGNOSIS — L819 Disorder of pigmentation, unspecified: Secondary | ICD-10-CM | POA: Diagnosis not present

## 2012-12-06 DIAGNOSIS — Z79899 Other long term (current) drug therapy: Secondary | ICD-10-CM | POA: Diagnosis not present

## 2012-12-06 DIAGNOSIS — E038 Other specified hypothyroidism: Secondary | ICD-10-CM | POA: Diagnosis not present

## 2013-02-11 DIAGNOSIS — E039 Hypothyroidism, unspecified: Secondary | ICD-10-CM | POA: Diagnosis not present

## 2013-03-07 ENCOUNTER — Other Ambulatory Visit: Payer: Self-pay

## 2013-03-07 DIAGNOSIS — Z1231 Encounter for screening mammogram for malignant neoplasm of breast: Secondary | ICD-10-CM

## 2013-04-15 ENCOUNTER — Ambulatory Visit
Admission: RE | Admit: 2013-04-15 | Discharge: 2013-04-15 | Disposition: A | Discharge status: Home / Self Care | Payer: Medicare Other | Source: Ambulatory Visit

## 2013-04-15 DIAGNOSIS — Z1231 Encounter for screening mammogram for malignant neoplasm of breast: Secondary | ICD-10-CM | POA: Diagnosis not present

## 2013-04-19 DIAGNOSIS — IMO0002 Reserved for concepts with insufficient information to code with codable children: Secondary | ICD-10-CM | POA: Diagnosis not present

## 2013-04-19 DIAGNOSIS — R3129 Other microscopic hematuria: Secondary | ICD-10-CM | POA: Diagnosis not present

## 2013-04-19 DIAGNOSIS — Z23 Encounter for immunization: Secondary | ICD-10-CM | POA: Diagnosis not present

## 2013-04-19 DIAGNOSIS — E039 Hypothyroidism, unspecified: Secondary | ICD-10-CM | POA: Diagnosis not present

## 2013-04-29 DIAGNOSIS — R319 Hematuria, unspecified: Secondary | ICD-10-CM | POA: Diagnosis not present

## 2013-04-29 DIAGNOSIS — R3129 Other microscopic hematuria: Secondary | ICD-10-CM | POA: Diagnosis not present

## 2013-05-03 DIAGNOSIS — H25099 Other age-related incipient cataract, unspecified eye: Secondary | ICD-10-CM | POA: Diagnosis not present

## 2013-05-23 DIAGNOSIS — E040 Nontoxic diffuse goiter: Secondary | ICD-10-CM | POA: Diagnosis not present

## 2013-07-19 ENCOUNTER — Ambulatory Visit (HOSPITAL_COMMUNITY)
Admission: RE | Admit: 2013-07-19 | Discharge: 2013-07-19 | Disposition: A | Discharge status: Home / Self Care | Payer: Medicare Other | Source: Ambulatory Visit | Attending: Physician Assistant | Admitting: Physician Assistant

## 2013-07-19 ENCOUNTER — Encounter (HOSPITAL_COMMUNITY): Payer: Self-pay

## 2013-07-19 ENCOUNTER — Other Ambulatory Visit (HOSPITAL_COMMUNITY): Payer: Self-pay | Admitting: Physician Assistant

## 2013-07-19 DIAGNOSIS — R1904 Left lower quadrant abdominal swelling, mass and lump: Secondary | ICD-10-CM | POA: Diagnosis not present

## 2013-07-19 DIAGNOSIS — IMO0002 Reserved for concepts with insufficient information to code with codable children: Secondary | ICD-10-CM | POA: Diagnosis not present

## 2013-07-19 DIAGNOSIS — R1907 Generalized intra-abdominal and pelvic swelling, mass and lump: Secondary | ICD-10-CM

## 2013-07-19 DIAGNOSIS — R109 Unspecified abdominal pain: Secondary | ICD-10-CM

## 2013-07-19 DIAGNOSIS — K5730 Diverticulosis of large intestine without perforation or abscess without bleeding: Secondary | ICD-10-CM | POA: Insufficient documentation

## 2013-07-19 DIAGNOSIS — R1031 Right lower quadrant pain: Secondary | ICD-10-CM | POA: Diagnosis not present

## 2013-07-19 MED ORDER — IOHEXOL 300 MG/ML  SOLN
100.0000 mL | Freq: Once | INTRAMUSCULAR | Status: AC | PRN
  Administered 2013-07-19: 100 mL via INTRAVENOUS

## 2013-07-23 ENCOUNTER — Ambulatory Visit (INDEPENDENT_AMBULATORY_CARE_PROVIDER_SITE_OTHER): Payer: Medicare Other | Admitting: Surgery

## 2013-07-23 ENCOUNTER — Encounter (INDEPENDENT_AMBULATORY_CARE_PROVIDER_SITE_OTHER): Payer: Self-pay | Admitting: Surgery

## 2013-07-23 VITALS — BP 138/72 | HR 88 | Temp 98.0°F | Resp 16 | Ht 67.0 in | Wt 133.2 lb

## 2013-07-23 DIAGNOSIS — C494 Malignant neoplasm of connective and soft tissue of abdomen: Secondary | ICD-10-CM | POA: Diagnosis not present

## 2013-07-23 DIAGNOSIS — C44509 Unspecified malignant neoplasm of skin of other part of trunk: Secondary | ICD-10-CM

## 2013-07-23 NOTE — Progress Notes (Signed)
General Surgery [facility]  Chief Complaint  Patient presents with  . New Evaluation    abdominal wall mass - referral from Collene Mares, PA-C, at [place]: Patient is a pleasant 68 year old female referred from her primary care physician's office for evaluation of a lower abdominal mass. Patient had noted some enlargement of her lower abdomen for approximately one month. One week ago she could feel a mass in the lower abdominal wall and began having minor discomfort. She denies fevers or chills. She denies any change in her bowel habits. She presented for evaluation. This included a CT scan of the abdomen and pelvis performed on 07/19/2013. This shows a lobulated mass in the midline of the lower abdomen involving both the right and left rectus musculature measuring 9.7 x 7.9 x 5.5 cm. Radiologist favors sarcoma. Percutaneous biopsy was recommended.  Previous abdominal surgery includes hysterectomy in 1988. Patient was told at that time that she might have ovarian cancer. No further treatment was given. Patient has also had laparoscopic cholecystectomy performed in 2009 by Dr. Margot Chimes.    Past Medical History  Diagnosis Date  . Ovarian cyst   . Thyroid disease     Current Outpatient Prescriptions  Medication Sig Dispense Refill  . Biotin 5000 MCG CAPS Take by mouth.      . Cholecalciferol (VITAMIN D PO) Take by mouth.      . estrogens, conjugated, (PREMARIN) 0.625 MG tablet Take 1 tablet (0.625 mg total) by mouth every other day.  30 tablet  11  . levothyroxine (SYNTHROID, LEVOTHROID) 75 MCG tablet Take 75 mcg by mouth daily.        . Multiple Vitamins-Minerals (MULTIVITAMIN WITH MINERALS) tablet Take 1 tablet by mouth daily.        . Omega-3 Fatty Acids (FISH OIL) 1200 MG CAPS Take 1 capsule by mouth daily.        Marland Kitchen MAGNESIUM PO Take by mouth.      . Probiotic Product (PROBIOTIC PO) Take by mouth.       No current facility-administered  medications for this visit.    No Known Allergies  Family History  Problem Relation Age of Onset  . Heart disease Mother   . Heart disease Sister   . Diabetes Sister   . Cancer Sister     melinoma-skin cancer  . Breast cancer Paternal Grandmother     Age 26's  . Cancer Paternal Grandmother     breast  . Cancer Paternal Grandfather     prostate/bladder    History   Social History  . Marital Status: Single    Spouse Name: N/A    Number of Children: N/A  . Years of Education: N/A   Social History Main Topics  . Smoking status: Never Smoker   . Smokeless tobacco: None  . Alcohol Use: No  . Drug Use: No  . Sexual Activity: No     Comment: HYST   Other Topics Concern  . None   Social History Narrative  . None    REVIEW OF SYSTEMS - PERTINENT POSITIVES ONLY: Denies signs or symptoms of obstruction. Intermittent minor discomfort. Denies fevers or chills.  EXAM: Filed Vitals:   07/23/13 1112  BP: 138/72  Pulse: 88  Temp: 98 F (36.7 C)  Resp: 16    GENERAL: well-developed, well-nourished, no acute distress HEENT: normocephalic; pupils equal and reactive; sclerae clear; dentition good; mucous membranes moist NECK:  No palpable masses  in the thyroid bed; symmetric on extension; no palpable anterior or posterior cervical lymphadenopathy; no supraclavicular masses; no tenderness CHEST: clear to auscultation bilaterally without rales, rhonchi, or wheezes CARDIAC: regular rate and rhythm without significant murmur; peripheral pulses are full ABDOMEN: soft without distension; bowel sounds present; no hepatosplenomegaly; no hernia; firm mass lower midline abdominal wall approximately 10 cm in diameter rising above the muscular plane, mildly tender; no palpable adenopathy in either groin EXT:  non-tender without edema; no deformity NEURO: no gross focal deficits; no sign of tremor   LABORATORY RESULTS: See [facility] ([facility]-Epic) for most recent  results  RADIOLOGY RESULTS: See [facility] ([facility]L-Epic) for most recent results  IMPRESSION: Soft tissue neoplasm of the lower abdominal wall of uncertain behavior, 10 cm  PLAN: I discussed these findings with the patient and her friend who accompanies her today. I have recommended that we proceed with percutaneous needle biopsy in the immediate future. Based on those findings we will arrange for consultation with the appropriate surgeon for resection. I will contact her with the results of the biopsy as soon as they are available.  Patient will be scheduled for percutaneous core needle biopsy by interventional radiology as soon as possible.  Earnstine Regal, MD, [facility]  Primary Care Physician: Glo Herring., MD

## 2013-07-24 ENCOUNTER — Telehealth (INDEPENDENT_AMBULATORY_CARE_PROVIDER_SITE_OTHER): Payer: Self-pay | Admitting: General Surgery

## 2013-07-24 NOTE — Telephone Encounter (Signed)
Pt called to clarify what she was told yesterday in clinic by Dr. Harlow Asa.  States she was slightly caught off-guard and couldn't remember all that he told her.  Reviewed office note and explained needle biopsy to be done and information obtained from it would help dictate the next step for her.  She states appreciation for clarification.

## 2013-07-26 ENCOUNTER — Encounter (HOSPITAL_COMMUNITY): Payer: Self-pay | Admitting: Pharmacy Technician

## 2013-07-26 ENCOUNTER — Other Ambulatory Visit: Payer: Self-pay | Admitting: Radiology

## 2013-07-30 ENCOUNTER — Encounter (HOSPITAL_COMMUNITY): Payer: Self-pay

## 2013-07-30 ENCOUNTER — Ambulatory Visit (HOSPITAL_COMMUNITY)
Admission: RE | Admit: 2013-07-30 | Discharge: 2013-07-30 | Disposition: A | Discharge status: Home / Self Care | Payer: Medicare Other | Source: Ambulatory Visit | Attending: Surgery | Admitting: Surgery

## 2013-07-30 DIAGNOSIS — C779 Secondary and unspecified malignant neoplasm of lymph node, unspecified: Secondary | ICD-10-CM | POA: Diagnosis not present

## 2013-07-30 DIAGNOSIS — Z01818 Encounter for other preprocedural examination: Secondary | ICD-10-CM | POA: Diagnosis not present

## 2013-07-30 DIAGNOSIS — Z90710 Acquired absence of both cervix and uterus: Secondary | ICD-10-CM | POA: Diagnosis not present

## 2013-07-30 DIAGNOSIS — Z9079 Acquired absence of other genital organ(s): Secondary | ICD-10-CM | POA: Diagnosis not present

## 2013-07-30 DIAGNOSIS — C50919 Malignant neoplasm of unspecified site of unspecified female breast: Secondary | ICD-10-CM | POA: Insufficient documentation

## 2013-07-30 DIAGNOSIS — Z9089 Acquired absence of other organs: Secondary | ICD-10-CM | POA: Insufficient documentation

## 2013-07-30 DIAGNOSIS — R1900 Intra-abdominal and pelvic swelling, mass and lump, unspecified site: Secondary | ICD-10-CM | POA: Diagnosis not present

## 2013-07-30 DIAGNOSIS — C44509 Unspecified malignant neoplasm of skin of other part of trunk: Secondary | ICD-10-CM

## 2013-07-30 DIAGNOSIS — C801 Malignant (primary) neoplasm, unspecified: Secondary | ICD-10-CM | POA: Insufficient documentation

## 2013-07-30 LAB — BASIC METABOLIC PANEL
BUN: 17 mg/dL (ref 6–23)
CALCIUM: 9.5 mg/dL (ref 8.4–10.5)
CHLORIDE: 101 meq/L (ref 96–112)
CO2: 28 mEq/L (ref 19–32)
CREATININE: 0.75 mg/dL (ref 0.50–1.10)
GFR calc Af Amer: >90 mL/min (ref >90)
GFR calc non Af Amer: 86 mL/min — ABNORMAL LOW (ref >90)
GLUCOSE: 102 mg/dL — AB (ref 70–99)
Potassium: 4 mEq/L (ref 3.7–5.3)
Sodium: 140 mEq/L (ref 137–147)

## 2013-07-30 LAB — PROTIME-INR
INR: 1.01 (ref 0.00–1.49)
Prothrombin Time: 13.1 seconds (ref 11.6–15.2)

## 2013-07-30 LAB — CBC
HCT: 38.1 % (ref 36.0–46.0)
Hemoglobin: 13 g/dL (ref 12.0–15.0)
MCH: 31.4 pg (ref 26.0–34.0)
MCHC: 34.1 g/dL (ref 30.0–36.0)
MCV: 92 fL (ref 78.0–100.0)
PLATELETS: 232 10*3/uL (ref 150–400)
RBC: 4.14 MIL/uL (ref 3.87–5.11)
RDW: 12.3 % (ref 11.5–15.5)
WBC: 5.9 10*3/uL (ref 4.0–10.5)

## 2013-07-30 LAB — APTT: aPTT: 28 seconds (ref 24–37)

## 2013-07-30 MED ORDER — MIDAZOLAM HCL 2 MG/2ML IJ SOLN
INTRAMUSCULAR | Status: AC
  Filled 2013-07-30: qty 4

## 2013-07-30 MED ORDER — FENTANYL CITRATE 0.05 MG/ML IJ SOLN
INTRAMUSCULAR | Status: AC
  Filled 2013-07-30: qty 4

## 2013-07-30 MED ORDER — MIDAZOLAM HCL 2 MG/2ML IJ SOLN
INTRAMUSCULAR | Status: AC | PRN
  Administered 2013-07-30: 0.5 mg via INTRAVENOUS
  Administered 2013-07-30: 1 mg via INTRAVENOUS

## 2013-07-30 MED ORDER — FENTANYL CITRATE 0.05 MG/ML IJ SOLN
INTRAMUSCULAR | Status: AC | PRN
  Administered 2013-07-30: 50 ug via INTRAVENOUS
  Administered 2013-07-30: 25 ug via INTRAVENOUS

## 2013-07-30 MED ORDER — SODIUM CHLORIDE 0.9 % IV SOLN
INTRAVENOUS | Status: AC | PRN
  Administered 2013-07-30: 75 mL/h via INTRAVENOUS

## 2013-07-30 MED ORDER — SODIUM CHLORIDE 0.9 % IV SOLN
INTRAVENOUS | Status: DC
  Administered 2013-07-30: 09:00:00 via INTRAVENOUS

## 2013-07-30 NOTE — Procedures (Signed)
Ultrasound guided core biopsies of abdominal wall mass.  4 cores obtained and no immediate complication.

## 2013-07-30 NOTE — Sedation Documentation (Signed)
O2 d/c'd 

## 2013-07-30 NOTE — Discharge Instructions (Signed)
Biopsy Care After Refer to this sheet in the next few weeks. These instructions provide you with information on caring for yourself after your procedure. Your caregiver may also give you more specific instructions. Your treatment has been planned according to current medical practices, but problems sometimes occur. Call your caregiver if you have any problems or questions after your procedure. If you had a fine needle biopsy, you may have soreness at the biopsy site for 1 to 2 days. If you had an open biopsy, you may have soreness at the biopsy site for 3 to 4 days. HOME CARE INSTRUCTIONS   You may resume normal diet and activities as directed.  Change bandages (dressings) as directed. If your wound was closed with a skin glue (adhesive), it will wear off and begin to peel in 7 days.  Only take over-the-counter or prescription medicines for pain, discomfort, or fever as directed by your caregiver.  Ask your caregiver when you can bathe and get your wound wet. SEEK IMMEDIATE MEDICAL CARE IF:   You have increased bleeding (more than a small spot) from the biopsy site.  You notice redness, swelling, or increasing pain at the biopsy site.  You have pus coming from the biopsy site.  You have a fever.  You notice a bad smell coming from the biopsy site or dressing.  You have a rash, have difficulty breathing, or have any allergic problems. MAKE SURE YOU:   Understand these instructions.  Will watch your condition.  Will get help right away if you are not doing well or get worse. Document Released: 11/19/2004 Document Revised: 07/25/2011 Document Reviewed: 10/28/2010 [facility] Patient Information 2014 [place].

## 2013-07-30 NOTE — Sedation Documentation (Signed)
Denies pain

## 2013-07-30 NOTE — H&P (Signed)
[name] is an 68 y.o. female.   Chief Complaint: Pt noticed slight bulging of low abd even 1 yr ago. Thought was just gaining weight. Pain developed in abd few weeks ago and was evaluated by MD Denies wt loss CT reveals abd wall mass; largest at Rt rectus musculature Scheduled now for biopsy Pt has hx hysterectomy 1988 Hysterectomy was for Ovarian cyst but she feels like MD reported to her that "washings" were Uterine Cancer. Not sure.  HPI: Ov cyst; Thyroid disease; poss Hx Ut Ca  Past Medical History  Diagnosis Date  . Ovarian cyst   . Thyroid disease     Past Surgical History  Procedure Laterality Date  . Tonsillectomy    . Cholecystectomy    . Oophorectomy      BSO  . Total abdominal hysterectomy  1988    ovarian cyst  BSO  . Tonsillectomy      Family History  Problem Relation Age of Onset  . Heart disease Mother   . Heart disease Sister   . Diabetes Sister   . Cancer Sister     melinoma-skin cancer  . Breast cancer Paternal Grandmother     Age 42's  . Cancer Paternal Grandmother     breast  . Cancer Paternal Grandfather     prostate/bladder   Social History:  reports that she has never smoked. She does not have any smokeless tobacco history on file. She reports that she does not drink alcohol or use illicit drugs.  Allergies: No Known Allergies   (Not in a hospital admission)  No results found for this or any previous visit (from the past 48 hour(s)). No results found.  Review of Systems  Constitutional: Negative for fever and weight loss.  Respiratory: Negative for shortness of breath.   Cardiovascular: Negative for chest pain.  Gastrointestinal: Positive for abdominal pain. Negative for nausea, vomiting and diarrhea.  Neurological: Negative for dizziness and weakness.  Psychiatric/Behavioral: Negative for substance abuse.    Blood pressure 140/66, pulse 85, temperature 98.4 F (36.9 C), temperature source Oral, resp. rate 18, height 5'  7" (1.702 m), weight 60.328 kg (133 lb), SpO2 100.00%. Physical Exam  Constitutional: She is oriented to person, place, and time. She appears well-developed and well-nourished.  Cardiovascular: Normal rate and regular rhythm.   No murmur heard. Respiratory: Effort normal and breath sounds normal. She has no wheezes.  GI: Soft. Bowel sounds are normal. There is tenderness.  Low abd slight tender  Musculoskeletal: Normal range of motion.  Neurological: She is alert and oriented to person, place, and time.  Skin: Skin is warm and dry.  Psychiatric: She has a normal mood and affect. Her behavior is normal. Judgment and thought content normal.     Assessment/Plan Low abdominal wall mass Most significant at Rt rectus musculature Possible remote hx of Ut Ca Scheduled now for biopsy of mass Pt aware of procedure benefits and risks and agreeable to proceed Consent signed and in chart  [place] A 07/30/2013, 9:21 AM

## 2013-08-01 ENCOUNTER — Telehealth (INDEPENDENT_AMBULATORY_CARE_PROVIDER_SITE_OTHER): Payer: Self-pay

## 2013-08-01 ENCOUNTER — Telehealth (INDEPENDENT_AMBULATORY_CARE_PROVIDER_SITE_OTHER): Payer: Self-pay | Admitting: Surgery

## 2013-08-01 ENCOUNTER — Other Ambulatory Visit (INDEPENDENT_AMBULATORY_CARE_PROVIDER_SITE_OTHER): Payer: Self-pay

## 2013-08-01 DIAGNOSIS — C801 Malignant (primary) neoplasm, unspecified: Secondary | ICD-10-CM

## 2013-08-01 DIAGNOSIS — C44509 Unspecified malignant neoplasm of skin of other part of trunk: Secondary | ICD-10-CM

## 2013-08-01 NOTE — Telephone Encounter (Signed)
I have completed referral in epic and will also route a msg to Marcellus Scott to assist with getting appt asap.

## 2013-08-01 NOTE — Telephone Encounter (Signed)
Pt called for path result. Request and phone #s sent to Dr Harlow Asa to call pt.

## 2013-08-01 NOTE — Telephone Encounter (Signed)
Telephone call to patient with pathology results of percutaneous biopsy of abdominal wall mass.  This shows adenocarcinoma from unknown source.  Not colon.  Possibly uterine, lung, pancreas, or upper GI tract.  Forwarded path to primary MD, oncology (Dr. Benay Spice), and surgical oncology (Dr. Barry Dienes).  Will likely need more scans / studies to identify source.  Jenny Reichmann - please schedule consult with Dr. [name] for this patient.  Earnstine Regal, MD, [facility] Office: [phone]

## 2013-08-01 NOTE — Telephone Encounter (Signed)
Path result is in epic. Will send msg to Dr Harlow Asa to review and call pt with result.

## 2013-08-01 NOTE — Telephone Encounter (Signed)
Referral request to [facility] to make appt. Msg routed to [facility] also.

## 2013-08-05 ENCOUNTER — Other Ambulatory Visit (INDEPENDENT_AMBULATORY_CARE_PROVIDER_SITE_OTHER): Payer: Self-pay | Admitting: General Surgery

## 2013-08-05 ENCOUNTER — Telehealth: Payer: Self-pay | Admitting: *Deleted

## 2013-08-05 DIAGNOSIS — C801 Malignant (primary) neoplasm, unspecified: Secondary | ICD-10-CM

## 2013-08-05 NOTE — Telephone Encounter (Signed)
Spoke with patient by phone and conformed appointment with Dr. Benay Spice for 08/07/13.  Contact names, numbers, and directions were provided.

## 2013-08-06 ENCOUNTER — Ambulatory Visit
Admission: RE | Admit: 2013-08-06 | Discharge: 2013-08-06 | Disposition: A | Discharge status: Home / Self Care | Payer: Medicare Other | Source: Ambulatory Visit | Attending: General Surgery | Admitting: General Surgery

## 2013-08-06 DIAGNOSIS — C801 Malignant (primary) neoplasm, unspecified: Secondary | ICD-10-CM

## 2013-08-06 DIAGNOSIS — C3490 Malignant neoplasm of unspecified part of unspecified bronchus or lung: Secondary | ICD-10-CM | POA: Diagnosis not present

## 2013-08-06 MED ORDER — IOHEXOL 300 MG/ML  SOLN
75.0000 mL | Freq: Once | INTRAMUSCULAR | Status: AC | PRN
  Administered 2013-08-06: 75 mL via INTRAVENOUS

## 2013-08-07 ENCOUNTER — Ambulatory Visit: Payer: Medicare Other

## 2013-08-07 ENCOUNTER — Telehealth: Payer: Self-pay | Admitting: Oncology

## 2013-08-07 ENCOUNTER — Ambulatory Visit (HOSPITAL_BASED_OUTPATIENT_CLINIC_OR_DEPARTMENT_OTHER): Payer: Medicare Other | Admitting: Oncology

## 2013-08-07 ENCOUNTER — Encounter: Payer: Self-pay | Admitting: Oncology

## 2013-08-07 VITALS — BP 145/66 | HR 99 | Temp 98.2°F | Resp 18 | Ht 67.0 in | Wt 131.6 lb

## 2013-08-07 DIAGNOSIS — Z8543 Personal history of malignant neoplasm of ovary: Secondary | ICD-10-CM | POA: Diagnosis not present

## 2013-08-07 DIAGNOSIS — C779 Secondary and unspecified malignant neoplasm of lymph node, unspecified: Secondary | ICD-10-CM | POA: Diagnosis not present

## 2013-08-07 DIAGNOSIS — C569 Malignant neoplasm of unspecified ovary: Secondary | ICD-10-CM

## 2013-08-07 DIAGNOSIS — C50919 Malignant neoplasm of unspecified site of unspecified female breast: Secondary | ICD-10-CM | POA: Diagnosis not present

## 2013-08-07 DIAGNOSIS — C801 Malignant (primary) neoplasm, unspecified: Secondary | ICD-10-CM | POA: Diagnosis not present

## 2013-08-07 NOTE — Telephone Encounter (Signed)
gv and printed appt sched and avs for pt for April....MD will add appt for 4.8.15 on MD day off....sent pt ot lab

## 2013-08-07 NOTE — Progress Notes (Signed)
Checked in new pt with no financial concerns. °

## 2013-08-07 NOTE — Telephone Encounter (Signed)
gv adn printed appt sched and avs for pt for April....sent pt to lab

## 2013-08-07 NOTE — Progress Notes (Signed)
[facility] Patient Consult   Referring MD: Luetta Piazza 68 y.o.  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 Tinner noted fullness in the low abdomen approximately 3 weeks ago. She saw her primary physician and was referred for a CT of the abdomen and pelvis on 07/19/2013. The CT revealed negative lung bases. A lobulated soft tissue mass was noted to the right of midline at the low abdomen and suprapubic region. The mass was inseparable from both rectus muscles. A narrow fat plane was noted between the mass and underlying small bowel and bladder. No inguinal or pelvic sidewall lymphadenopathy. No free pelvic fluid. The uterus is surgically absent. Normal liver and enhancement. Normal spleen, pancreas, and adrenal glands. No lymphadenopathy. Small cysts in the left kidney.  She was referred to Dr. Harlow Asa and was noted to have a firm mass at the lower mid abdominal wall measuring approximately 10 cm.  She was referred to interventional radiology and underwent an ultrasound-guided biopsy of the mass on 07/30/2013. The pathology ([id number]) confirmed metastatic adenocarcinoma involving soft tissue. The tumor cells were positive for cytokeratin 7 and negative for cytokeratin 20. Negative TTF-1 and CDX2. Patchy mild to moderate expression of vimentin. The differential diagnosis was felt to include a gynecologic primary as well as lung and upper gastrointestinal malignancies.  She reports mild discomfort associated with the low abdominal mass. She otherwise feels well.  She reports a history of "ovarian cancer "diagnosed with she underwent a hysterectomy and bilateral oophorectomy in 1988. She had surgery by Dr. Ree Edman and did not receive additional therapy.  Past Medical History  Diagnosis Date  . Ovarian cyst/"cancer "  1988   . Thyroid disease     .   Microscopic hematuria-followed by  Dr. Karsten Ro   .   Restless leg syndrome  Past Surgical History  Procedure Laterality Date  . Tonsillectomy   child   . Cholecystectomy    . Oophorectomy   1988     BSO  . Total abdominal hysterectomy  1988    ovarian cyst  BSO  .       Medications: Reviewed  Allergies: No Known Allergies  Family history: Her paternal grandfather had bladder cancer, her paternal grandmother had breast cancer. Both of her sister's have a history of melanoma. No other family history of cancer.  Social History:   She lives alone in [place]. She works as a Network engineer. She does not use tobacco or alcohol. No transfusion history. No risk factor for HIV or hepatitis.   ROS:   Positives include: Mild discomfort at the low abdominal mass  A complete ROS was otherwise negative.  Physical Exam:  Blood pressure 145/66, pulse 99, temperature 98.2 F (36.8 C), temperature source Oral, resp. rate 18, height 5\' 7"  (1.702 m), weight 131 lb 9.6 oz (59.693 kg), SpO2 100.00%.  HEENT: Oropharynx without visible mass, neck without mass Lungs: Clear bilaterally Cardiac: Regular rate and rhythm Abdomen: No hepatosplenomegaly, no apparent ascites, firm mass at the low mid abdomen centered to the right of midline and underlying the low transverse incision  Vascular: No leg edema Lymph nodes: No cervical, supraclavicular, axillary, or inguinal nodes Neurologic: Alert and oriented, the motor exam appears intact in the upper and lower extremities Skin: No rash Musculoskeletal: No spine tenderness Breasts: Bilateral breast without mass   LAB:  CBC  Lab Results  Component Value  Date   WBC 5.9 07/30/2013   HGB 13.0 07/30/2013   HCT 38.1 07/30/2013   MCV 92.0 07/30/2013   PLT 232 07/30/2013   NEUTROABS 2.9 11/07/2007     CMP      Component Value Date/Time   NA 140 07/30/2013 0852   K 4.0 07/30/2013 0852   CL 101 07/30/2013 0852   CO2 28 07/30/2013 0852   GLUCOSE 102* 07/30/2013 0852   BUN 17 07/30/2013  0852   CREATININE 0.75 07/30/2013 0852   CALCIUM 9.5 07/30/2013 0852   PROT 6.2 11/07/2007 0900   ALBUMIN 3.3* 11/07/2007 0900   AST 23 11/07/2007 0900   ALT 13 11/07/2007 0900   ALKPHOS 38* 11/07/2007 0900   BILITOT 0.8 11/07/2007 0900   GFRNONAA 86* 07/30/2013 0852   GFRAA >90 07/30/2013 0852     Imaging:  Ct Chest W Contrast  08/06/2013   CLINICAL DATA:  Adenocarcinoma of suprapubic mass. Question metastases.  EXAM: CT CHEST WITH CONTRAST  TECHNIQUE: Multidetector CT imaging of the chest was performed during intravenous contrast administration.  CONTRAST:  24mL OMNIPAQUE IOHEXOL 300 MG/ML  SOLN  COMPARISON:  [place] CORE BIOPSY dated 07/30/2013; CT ABD - PELV W/ CM dated 07/19/2013; CT CHEST W/O CM dated 03/27/2008  FINDINGS: Biapical pleural/ parenchymal densities compatible with scarring. This is stable since 2009. No pulmonary nodules or confluent airspace opacities. Scarring in the lingula is stable. No effusions. Heart is normal size. Coronary artery calcifications in the left anterior descending coronary artery. Aorta is normal caliber.  No mediastinal, hilar, or axillary adenopathy. Chest wall soft tissues are unremarkable. Imaging into the upper abdomen shows no acute findings. No acute or focal bony abnormality.  IMPRESSION: Biapical and lingular scarring.  No evidence of acute findings or metastatic disease in the chest.  Coronary artery disease in the left anterior descending coronary artery.   Electronically Signed   By: Rolm Baptise M.D.   On: 08/06/2013 14:52   CT abdomen and pelvis 07/19/2013-there is a mass in the low abdominal wall involving the rectus muscles. I reviewed the CT images with Mrs. Kundert and her family   Assessment/Plan:   1. 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 Report  of "ovarian "cancer in 1998, status post a hysterectomy and bilateral  oophorectomy      Disposition:   Ms. Slabach has been diagnosed with metastatic adenocarcinoma. I discussed the differential diagnosis with Ms. Abrams and her family. She gives a history of remote ovarian cancer. We will obtain these records. It is possible she has an abdominal wall recurrence of ovarian cancer. She may have had a low malignant potential ovarian cancer many years ago.  I will contact pathology to request additional immunohistochemical stains. We checked a CA 125 today.  Ms. Dabbs is scheduled to see Dr. Barry Dienes later this week to consider resection of the abdominal wall mass. I think it is reasonable to consider resection if this proves to be ovarian cancer.  She will return for an office visit in 2 weeks.  [place], [place] 08/07/2013, 1:41 PM

## 2013-08-08 LAB — CA 125: CA 125: 128.1 U/mL — AB (ref 0.0–30.2)

## 2013-08-09 ENCOUNTER — Ambulatory Visit (INDEPENDENT_AMBULATORY_CARE_PROVIDER_SITE_OTHER): Payer: Medicare Other | Admitting: General Surgery

## 2013-08-09 ENCOUNTER — Encounter (INDEPENDENT_AMBULATORY_CARE_PROVIDER_SITE_OTHER): Payer: Self-pay | Admitting: General Surgery

## 2013-08-09 ENCOUNTER — Telehealth (INDEPENDENT_AMBULATORY_CARE_PROVIDER_SITE_OTHER): Payer: Self-pay | Admitting: *Deleted

## 2013-08-09 VITALS — BP 118/74 | HR 75 | Temp 98.7°F | Resp 14 | Ht 62.0 in | Wt 131.6 lb

## 2013-08-09 DIAGNOSIS — C801 Malignant (primary) neoplasm, unspecified: Secondary | ICD-10-CM

## 2013-08-09 DIAGNOSIS — C799 Secondary malignant neoplasm of unspecified site: Secondary | ICD-10-CM

## 2013-08-09 NOTE — Telephone Encounter (Signed)
I attempted to call patient but unable to leave voicemail.  I was calling to notify her of the appt for her to see Dr. Iran Planas at the [facility] on 08/19/13 @ 2:00pm.  The address is [address]. Their phone number is [phone] if she needs to reschedule.

## 2013-08-09 NOTE — Patient Instructions (Signed)
We will set up appointments with Dr. Alycia Rossetti of gyn oncology and with Dr. Iran Planas of plastic surgery.  I will notify Dr. Benay Spice regarding chemo.  I will see you back just after chemotherapy with imaging (repeat CT scan).

## 2013-08-09 NOTE — Progress Notes (Signed)
Chief Complaint  Patient presents with  . metastatic adenocaricinoma    new pt    HISTORY: She is a 68 year old female here presented with abdominal wall mass around 4-6 weeks ago. She palpated this while scratching her abdominal wall. Her primary care physician ordered a CT scan which demonstrated a mass in the right rectus muscle. The mass was biopsied. This was adenocarcinoma of unknown primary. Her scans do not demonstrate any GI masses or lung masses. She had a total abdominal hysterectomy with oophorectomy in 1988. She was told that she had ovarian cancer at the time, that it was a low stage and she did not need additional treatment.  She denies pain. She has taken an occasional ibuprofen but has not needed any narcotics. She denies any change in her bowel habits or weight loss. She is accompanied by her first cousin and her sister. She has not had any fevers or chills. She denies blood in her stools. She has not noted any change in the size of this mass.  Past Medical History  Diagnosis Date  . Ovarian cyst   . Thyroid disease     Past Surgical History  Procedure Laterality Date  . Tonsillectomy    . Cholecystectomy    . Oophorectomy      BSO  . Total abdominal hysterectomy  1988    ovarian cyst  BSO  . Tonsillectomy      Current Outpatient Prescriptions  Medication Sig Dispense Refill  . acetaminophen (TYLENOL) 500 MG tablet Take 500 mg by mouth as needed for fever.       . Biotin 5000 MCG CAPS Take 5,000 mcg by mouth.       . Calcium Carb-Cholecalciferol (CALCIUM 600 + D PO) Take 1 tablet by mouth daily.      . Cholecalciferol (EQL VITAMIN D3) 1000 UNITS tablet Take 1,000 Units by mouth daily.      Marland Kitchen estrogens, conjugated, (PREMARIN) 0.625 MG tablet Take 0.625 mg by mouth 3 (three) times a week.      Marland Kitchen ibuprofen (ADVIL,MOTRIN) 200 MG tablet Take 200 mg by mouth as needed for fever.       . levothyroxine (SYNTHROID, LEVOTHROID) 75 MCG tablet Take 75 mcg by mouth daily.        . Magnesium 250 MG TABS Take 1 tablet by mouth daily as needed (for restless legs).      . Multiple Vitamins-Minerals (MULTIVITAMIN WITH MINERALS) tablet Take 1 tablet by mouth daily.       . Omega-3 Fatty Acids (FISH OIL) 1200 MG CAPS Take 1 capsule by mouth daily.       . Probiotic Product (PROBIOTIC PO) Take 1 tablet by mouth daily.        No current facility-administered medications for this visit.     No Known Allergies   Family History  Problem Relation Age of Onset  . Heart disease Mother   . Heart disease Sister   . Diabetes Sister   . Cancer Sister     melinoma-skin cancer  . Breast cancer Paternal Grandmother     Age 20's  . Cancer Paternal Grandmother     breast  . Cancer Paternal Grandfather     prostate/bladder     History   Social History  . Marital Status: Single    Spouse Name: N/A    Number of Children: N/A  . Years of Education: N/A   Social History Main Topics  . Smoking status: Never Smoker   .  Smokeless tobacco: None  . Alcohol Use: No  . Drug Use: No  . Sexual Activity: No     Comment: HYST   Other Topics Concern  . 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  Well nourished and well groomed.   Neurological: Alert and oriented to person, place, and time. Coordination normal.  Head: Normocephalic and atraumatic.  Eyes: Conjunctivae are normal. Pupils are equal, round, and reactive  to light. No scleral icterus.  Neck: Normal range of motion. Neck supple. No tracheal deviation or thyromegaly present.  Cardiovascular: Normal rate, regular rhythm, normal heart sounds and intact distal pulses.  Exam reveals no gallop and no friction rub.  No murmur heard. Respiratory: Effort normal.  No respiratory distress. No chest wall tenderness. Breath sounds normal.  No wheezes, rales or rhonchi.  GI: Soft. Bowel sounds are normal. The abdomen is soft and nontender.  There is no rebound and no guarding. There is a firm mass in the lower right rectus muscle.  This is mobile.  It does extend low toward the pubis.   Musculoskeletal: Normal range of motion. Extremities are nontender.  Lymphadenopathy: No cervical, preauricular, postauricular or axillary adenopathy is present Skin: Skin is warm and dry. No rash noted. No diaphoresis. No erythema. No pallor. No clubbing, cyanosis, or edema.   Psychiatric: Normal mood and affect. Behavior is normal. Judgment and thought content normal.    LABORATORY RESULTS: Available labs are reviewed  Pathology Diagnosis Soft Tissue Needle Core Biopsy, abdominal wall mass - METASTATIC ADENOCARCINOMA INVOLVING SOFT TISSUE, SEE COMMENT. The clinical history of hysterectomy for "possible endometrial cancer" is noted. The morphology and immunophenotype of the current biopsies is consistent with metastatic adenocarcinoma. Given the clinical history, metastasis from a gynecological primary is a consideration. However, metastasis from a primary lung, upper gastrointestinal tract or pancreatic-biliary tumor is not definitively excluded. The case was reviewed with Dr. Saralyn Pilar, who concurs. (CRR:caf 08/01/13) Following case signout and after discussion with oncology, the patient stated a questionable history of primary adnexal cancer. As such, an additional immunostain was performed. The tumor does not express WT-1 immunostain. The result was discussed with Dr.  Benay Spice on 08/08/13. (CRR:gt, 08/08/13)  Recent Results (from the past 2160 hour(s))  APTT     Status: None   Collection Time    07/30/13  8:52 AM      Result Value Ref Range   aPTT 28  24 - 37 seconds  BASIC METABOLIC PANEL     Status: Abnormal   Collection Time    07/30/13  8:52 AM      Result Value Ref Range   Sodium 140  137 - 147 mEq/L   Potassium 4.0  3.7 - 5.3 mEq/L   Chloride 101  96 - 112 mEq/L   CO2  28  19 - 32 mEq/L   Glucose, Bld 102 (*) 70 - 99 mg/dL   BUN 17  6 - 23 mg/dL   Creatinine, Ser 0.75  0.50 - 1.10 mg/dL   Calcium 9.5  8.4 - 10.5 mg/dL   GFR calc non Af Amer 86 (*) >90 mL/min   GFR calc Af Amer >90  >90 mL/min   Comment: (NOTE)     The eGFR has been calculated using the CKD EPI equation.     This calculation has not been validated in all clinical situations.     eGFR's persistently <90 mL/min signify possible Chronic Kidney     Disease.  CBC     Status: None   Collection Time    07/30/13  8:52 AM      Result Value Ref Range   WBC 5.9  4.0 - 10.5 K/uL   RBC 4.14  3.87 - 5.11 MIL/uL   Hemoglobin 13.0  12.0 - 15.0 g/dL   HCT 38.1  36.0 - 46.0 %   MCV 92.0  78.0 - 100.0 fL   MCH 31.4  26.0 - 34.0 pg   MCHC 34.1  30.0 - 36.0 g/dL   RDW 12.3  11.5 - 15.5 %   Platelets 232  150 - 400 K/uL  PROTIME-INR     Status: None   Collection Time    07/30/13  8:52 AM      Result Value Ref Range   Prothrombin Time 13.1  11.6 - 15.2 seconds   INR 1.01  0.00 - 1.49  CA 125     Status: Abnormal   Collection Time    08/07/13 12:36 PM      Result Value Ref Range   CA 125 128.1 (*) 0.0 - 30.2 U/mL     RADIOLOGY RESULTS: See E-Chart or I-Site for most recent results.  Images and reports are reviewed.  Ct Chest W Contrast  08/06/2013   CLINICAL DATA:  Adenocarcinoma of suprapubic mass. Question metastases.  EXAM: CT CHEST WITH CONTRAST  TECHNIQUE: Multidetector CT imaging of the chest was performed during intravenous contrast administration.  CONTRAST:  35m  OMNIPAQUE IOHEXOL 300 MG/ML  SOLN  COMPARISON:  [place] BIOPSY dated 07/30/2013; CT ABD - PELV W/ CM dated 07/19/2013; CT CHEST W/O CM dated 03/27/2008  FINDINGS: Biapical pleural/ parenchymal densities compatible with scarring. This is stable since 2009. No pulmonary nodules or confluent airspace opacities. Scarring in the lingula is stable. No effusions. Heart is normal size. Coronary artery calcifications in the left anterior descending coronary artery. Aorta is normal caliber.  No mediastinal, hilar, or axillary adenopathy. Chest wall soft tissues are unremarkable. Imaging into the upper abdomen shows no acute findings. No acute or focal bony abnormality.  IMPRESSION: Biapical and lingular scarring.  No evidence of acute findings or metastatic disease in the chest.  Coronary artery disease in the left anterior descending coronary artery.   Electronically Signed   By: KRolm BaptiseM.D.   On: 08/06/2013 14:52   Ct Abdomen Pelvis W Contrast  07/19/2013   CLINICAL DATA:  68year old female with right lower quadrant pain and swelling. Initial encounter. Cholecystectomy in 2009. Prior hysterectomy.  EXAM: CT ABDOMEN AND PELVIS WITH CONTRAST  TECHNIQUE: Multidetector CT imaging of the abdomen and pelvis was performed using the standard protocol following bolus administration of intravenous contrast.  CONTRAST:  1041mOMNIPAQUE IOHEXOL 300 MG/ML  SOLN  COMPARISON:  Abdomen ultrasound 09/14/2006.  FINDINGS: Negative lung bases.  No pericardial or pleural effusion.  No acute osseous abnormality identified. Advanced L4-L5 disc degeneration.  Lobulated soft tissue mass arising just to the right of midline in the lower abdomen and suprapubic region, inseparable from both caudal rectus muscles, more so the right. Approaching the mass the right rectus muscle is expanded (series 2, image 57. The mass is multilobulated and encompasses 55 x 79 x 97 cm (AP by transverse by CC, series 2, image 62, series 5, image 51).  There is a  narrow fat plane between the mass and underlying small bowel and bladder within the pelvis. No inguinal or pelvic sidewall lymphadenopathy. No pelvic free fluid. Uterus and adnexa not identified and appear to be surgically absent.  Negative distal colon except for retained stool and diverticulosis. Left colon, transverse colon, right colon within normal limits. Appendix not identified. Oral contrast has almost reached the terminal ileum. No dilated small bowel. Fairly decompressed stomach. Negative duodenum.  Surgically absent gallbladder. Mild intra and extrahepatic biliary ductal enlargement likely postoperative. Normal liver enhancement. Spleen, pancreas, and adrenal glands are normal. Portal venous system within normal limits. Major arterial structures in the abdomen and pelvis are patent. Aortoiliac calcified atherosclerosis noted. No abdominal free fluid. No lymphadenopathy. Kidneys within normal limits; small left upper pole low-density area with simple fluid densitometry.  IMPRESSION: 1. Lower abdominal/suprapubic ventral wall lobulated soft tissue mass, arising within the lower rectus musculature more so on the right. 5.5 x 7.9 x 9.7 cm. Favor sarcoma or other connective tissue tumor. This should be amenable to percutaneous biopsy. In a younger female patient, endometriosis within a Cesarean section scar would also be a consideration for this appearance. 2. Narrow fat plane between the mass in #1 and underlying small bowel and bladder. No lymphadenopathy. No ascites. 3. Surgically absent gallbladder, uterus, and adnexa. Diverticulosis of the colon. Study discussed by telephone with PA BENJAMIN MANN on 07/19/2013 at 11:35 .   Electronically Signed   By: Lars Pinks M.D.   On: 07/19/2013 11:40   [place] Core Biopsy  07/30/2013   CLINICAL DATA:  68 year old with an anterior abdominal wall mass in the lower abdomen. Tissue diagnosis is needed.  EXAM: ULTRASOUND-GUIDED BIOPSY OF ANTERIOR ABDOMINAL WALL MASS   Physician: Stephan Minister. Anselm Pancoast, MD  FLUOROSCOPY TIME:  None  MEDICATIONS: 1.5 mg versed, 75 mcg fentanyl. A radiology nurse monitored the patient for moderate sedation.  ANESTHESIA/SEDATION: Moderate sedation time: 12 min  PROCEDURE: The procedure was explained to the patient. The risks and benefits of the procedure were discussed and the patient's questions were addressed. Informed consent was obtained from the patient. The lower anterior abdominal wall was evaluated with ultrasound. The heterogeneous lesion was easily identified. The anterior abdomen was prepped with Betadine and a sterile drape was placed. The skin was anesthetized with 1% lidocaine. Using sterile technique, 17 gauge needle was directed into the lesion with ultrasound guidance. A total of 4 core biopsies were obtained with an 18 gauge device and specimens were placed in saline. 17 gauge needle was removed without complication.  FINDINGS: There is a heterogeneous lobulated mass along the anterior abdominal wall. Biopsy needle position was confirmed within the lesion.  COMPLICATIONS: None  IMPRESSION: Ultrasound-guided core biopsies of the anterior abdominal wall mass.   Electronically Signed   By: Markus Daft M.D.   On: 07/30/2013 10:52      ASSESSMENT AND PLAN: Adenocarcinoma of abdominal wall, unclear primary Will plan neoadjuvant chemotherapy to see if we can get shrinkage of  mass.  I will make referral to Dr. Alycia Rossetti of gyn oncology and Dr. Iran Planas of plastic surgery.  The abdominal wall will need closure due to the defect that will be created by resected such a large portion of the right rectus and part of left rectus.    Will reassess with CT after chemotherapy.     30 minutes spent in evaluation, examination, counseling, and coordination of care.    Milus Height MD Surgical Oncology, [facility]      Visit Diagnoses: 1. Metastatic cancer     Primary Care  Physician: Glo Herring., MD

## 2013-08-09 NOTE — Telephone Encounter (Signed)
I spoke with pt and made her aware of appt information below.  She is agreeable with the information provided.

## 2013-08-09 NOTE — Assessment & Plan Note (Signed)
Will plan neoadjuvant chemotherapy to see if we can get shrinkage of mass.  I will make referral to Dr. Alycia Rossetti of gyn oncology and Dr. Iran Planas of plastic surgery.  The abdominal wall will need closure due to the defect that will be created by resected such a large portion of the right rectus and part of left rectus.    Will reassess with CT after chemotherapy.

## 2013-08-12 ENCOUNTER — Telehealth: Payer: Self-pay | Admitting: Oncology

## 2013-08-12 ENCOUNTER — Other Ambulatory Visit: Payer: Self-pay | Admitting: *Deleted

## 2013-08-12 NOTE — Telephone Encounter (Signed)
s.w. pt and advised on April 1 appt...pt ok and aware

## 2013-08-14 ENCOUNTER — Ambulatory Visit (HOSPITAL_BASED_OUTPATIENT_CLINIC_OR_DEPARTMENT_OTHER): Payer: Medicare Other

## 2013-08-14 ENCOUNTER — Other Ambulatory Visit: Payer: Self-pay | Admitting: *Deleted

## 2013-08-14 ENCOUNTER — Telehealth: Payer: Self-pay | Admitting: Oncology

## 2013-08-14 ENCOUNTER — Encounter: Payer: Self-pay | Admitting: *Deleted

## 2013-08-14 ENCOUNTER — Ambulatory Visit (HOSPITAL_BASED_OUTPATIENT_CLINIC_OR_DEPARTMENT_OTHER): Payer: Medicare Other | Admitting: Oncology

## 2013-08-14 ENCOUNTER — Other Ambulatory Visit: Payer: Medicare Other

## 2013-08-14 VITALS — BP 154/64 | HR 112 | Temp 97.8°F | Resp 18 | Ht 62.0 in | Wt 132.1 lb

## 2013-08-14 DIAGNOSIS — C801 Malignant (primary) neoplasm, unspecified: Secondary | ICD-10-CM

## 2013-08-14 DIAGNOSIS — C569 Malignant neoplasm of unspecified ovary: Secondary | ICD-10-CM

## 2013-08-14 DIAGNOSIS — C779 Secondary and unspecified malignant neoplasm of lymph node, unspecified: Secondary | ICD-10-CM

## 2013-08-14 DIAGNOSIS — C50919 Malignant neoplasm of unspecified site of unspecified female breast: Secondary | ICD-10-CM | POA: Diagnosis not present

## 2013-08-14 DIAGNOSIS — Z8543 Personal history of malignant neoplasm of ovary: Secondary | ICD-10-CM

## 2013-08-14 LAB — CBC WITH DIFFERENTIAL/PLATELET
BASO%: 0.5 % (ref 0.0–2.0)
Basophils Absolute: 0 10*3/uL (ref 0.0–0.1)
EOS%: 0.4 % (ref 0.0–7.0)
Eosinophils Absolute: 0 10*3/uL (ref 0.0–0.5)
HCT: 38.3 % (ref 34.8–46.6)
HGB: 12.9 g/dL (ref 11.6–15.9)
LYMPH#: 1.4 10*3/uL (ref 0.9–3.3)
LYMPH%: 22.3 % (ref 14.0–49.7)
MCH: 31.3 pg (ref 25.1–34.0)
MCHC: 33.5 g/dL (ref 31.5–36.0)
MCV: 93.3 fL (ref 79.5–101.0)
MONO#: 0.6 10*3/uL (ref 0.1–0.9)
MONO%: 10.1 % (ref 0.0–14.0)
NEUT%: 66.7 % (ref 38.4–76.8)
NEUTROS ABS: 4.1 10*3/uL (ref 1.5–6.5)
Platelets: 269 10*3/uL (ref 145–400)
RBC: 4.11 10*6/uL (ref 3.70–5.45)
RDW: 12.4 % (ref 11.2–14.5)
WBC: 6.2 10*3/uL (ref 3.9–10.3)

## 2013-08-14 LAB — COMPREHENSIVE METABOLIC PANEL (CC13)
ALBUMIN: 3.7 g/dL (ref 3.5–5.0)
ALT: 12 U/L (ref 0–55)
AST: 22 U/L (ref 5–34)
Alkaline Phosphatase: 52 U/L (ref 40–150)
Anion Gap: 7 mEq/L (ref 3–11)
BILIRUBIN TOTAL: 0.22 mg/dL (ref 0.20–1.20)
BUN: 17.6 mg/dL (ref 7.0–26.0)
CO2: 28 mEq/L (ref 22–29)
Calcium: 10.4 mg/dL (ref 8.4–10.4)
Chloride: 105 mEq/L (ref 98–109)
Creatinine: 0.8 mg/dL (ref 0.6–1.1)
GLUCOSE: 106 mg/dL (ref 70–140)
Potassium: 4.6 mEq/L (ref 3.5–5.1)
Sodium: 141 mEq/L (ref 136–145)
Total Protein: 7.3 g/dL (ref 6.4–8.3)

## 2013-08-14 MED ORDER — DEXAMETHASONE 4 MG PO TABS: ORAL_TABLET | ORAL | Status: DC

## 2013-08-14 MED ORDER — PROCHLORPERAZINE MALEATE 10 MG PO TABS: 10.0000 mg | ORAL_TABLET | Freq: Four times a day (QID) | ORAL | Status: DC | PRN

## 2013-08-14 NOTE — Telephone Encounter (Signed)
Left message on voicemail for pt to call office to review Decadron pre-med instructions.

## 2013-08-14 NOTE — Telephone Encounter (Signed)
gv pt appt schedule for april. no care plan yet tx scheduled per 4/1 pof.

## 2013-08-14 NOTE — Progress Notes (Signed)
  [facility] OFFICE PROGRESS NOTE   Diagnosis: adenocarcinoma  INTERVAL HISTORY:   [name] has no new complaint. The abdominal mass is unchanged. She saw Dr. Barry Dienes to consider resection of the mass. Dr. Barry Dienes recommends neoadjuvant chemotherapy. [name] has been referred to GYN oncology and plastic surgery.   Review of records from 1988 confirm a low-grade adenocarcinoma of the ovary. There was an area of "borderline "carcinoma.  Objective:  Vital signs in last 24 hours:  Blood pressure 154/64, pulse 112, temperature 97.8 F (36.6 C), temperature source Oral, resp. rate 18, height 5\' 2"  (1.575 m), weight 132 lb 1.6 oz (59.92 kg), SpO2 100.00%.   Resp: lungs clear bilaterally Cardio: regular rate and rhythm GI: no hepatomegaly, low midline abdominal mass measures approximately 9.5 cm in transverse dimension Vascular: no leg edema   Lab Results:  Lab Results  Component Value Date   WBC 6.2 08/14/2013   HGB 12.9 08/14/2013   HCT 38.3 08/14/2013   MCV 93.3 08/14/2013   PLT 269 08/14/2013   NEUTROABS 4.1 08/14/2013    CA 125 on 08/07/2013-128.1  Imaging:  No results found.  Medications: I have reviewed the patient's current medications.  1. Metastatic adenocarcinoma involving a low abdominal wall mass Staging CTs of the chest, abdomen, and pelvis with no other site of metastatic disease or a primary tumor Elevated CA 125 2.  ovarian cancer in 1988,low-grade adenocarcinoma with areas of "borderline "carcinoma, status post a hysterectomy and bilateral oophorectomy   Assessment/Plan:  [name] appears stable. Her case was presented at the GI tumor conference today. 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  I discussed the case with Dr. Alycia Rossetti and Dr. Barry Dienes. The plan is to proceed with neoadjuvant Taxol/carboplatin chemotherapy in an attempt to shrink the tumor prior to surgical resection.  I reviewed the Taxol/carboplatin  chemotherapy regimen with [name] and her family. We discussed the potential toxicities associated with these chemotherapy agents. We reviewed the potential for nausea/vomiting, mucositis, alopecia, and hematologic toxicity. We discussed the allergic reaction, bone pain, and neuropathy associated with Taxol. We discussed the chance of an allergic reaction with carboplatin.we reviewed the bone pain, rash, and splenic rupture seen with Neulasta.    Disposition:  [name] will attend a chemotherapy teaching class today. She will be scheduled for a first cycle of Taxol/carboplatin on 08/17/2011. She will return for an office visit and cycle 2 on 09/06/2013. She knows to contact us for a fever or symptoms of an infection.  [name], MD  08/14/2013  3:25 PM

## 2013-08-15 ENCOUNTER — Telehealth: Payer: Self-pay | Admitting: *Deleted

## 2013-08-15 NOTE — Telephone Encounter (Signed)
Pt returned call, reviewed Decadron instructions. She wrote them down, teach back complete. Compazine instructions reviewed as well.

## 2013-08-16 ENCOUNTER — Ambulatory Visit (HOSPITAL_BASED_OUTPATIENT_CLINIC_OR_DEPARTMENT_OTHER): Payer: Medicare Other

## 2013-08-16 VITALS — BP 108/58 | HR 72 | Temp 97.1°F | Resp 16 | Ht 66.5 in

## 2013-08-16 DIAGNOSIS — C779 Secondary and unspecified malignant neoplasm of lymph node, unspecified: Secondary | ICD-10-CM | POA: Diagnosis not present

## 2013-08-16 DIAGNOSIS — C50919 Malignant neoplasm of unspecified site of unspecified female breast: Secondary | ICD-10-CM

## 2013-08-16 DIAGNOSIS — C801 Malignant (primary) neoplasm, unspecified: Secondary | ICD-10-CM | POA: Diagnosis not present

## 2013-08-16 DIAGNOSIS — Z5111 Encounter for antineoplastic chemotherapy: Secondary | ICD-10-CM

## 2013-08-16 DIAGNOSIS — C569 Malignant neoplasm of unspecified ovary: Secondary | ICD-10-CM

## 2013-08-16 MED ORDER — ONDANSETRON 16 MG/50ML IVPB (CHCC)
16.0000 mg | Freq: Once | INTRAVENOUS | Status: AC
  Administered 2013-08-16: 16 mg via INTRAVENOUS

## 2013-08-16 MED ORDER — PACLITAXEL CHEMO INJECTION 300 MG/50ML
175.0000 mg/m2 | Freq: Once | INTRAVENOUS | Status: AC
  Administered 2013-08-16: 282 mg via INTRAVENOUS
  Filled 2013-08-16: qty 47

## 2013-08-16 MED ORDER — SODIUM CHLORIDE 0.9 % IV SOLN
383.0000 mg | Freq: Once | INTRAVENOUS | Status: AC
  Administered 2013-08-16: 380 mg via INTRAVENOUS
  Filled 2013-08-16: qty 38

## 2013-08-16 MED ORDER — DEXAMETHASONE SODIUM PHOSPHATE 20 MG/5ML IJ SOLN
20.0000 mg | Freq: Once | INTRAMUSCULAR | Status: AC
  Administered 2013-08-16: 20 mg via INTRAVENOUS

## 2013-08-16 MED ORDER — DEXAMETHASONE SODIUM PHOSPHATE 20 MG/5ML IJ SOLN
INTRAMUSCULAR | Status: AC
  Filled 2013-08-16: qty 5

## 2013-08-16 MED ORDER — DIPHENHYDRAMINE HCL 50 MG/ML IJ SOLN
INTRAMUSCULAR | Status: AC
  Filled 2013-08-16: qty 1

## 2013-08-16 MED ORDER — FAMOTIDINE IN NACL 20-0.9 MG/50ML-% IV SOLN
INTRAVENOUS | Status: AC
  Filled 2013-08-16: qty 50

## 2013-08-16 MED ORDER — SODIUM CHLORIDE 0.9 % IV SOLN
Freq: Once | INTRAVENOUS | Status: AC
  Administered 2013-08-16: 09:00:00 via INTRAVENOUS

## 2013-08-16 MED ORDER — ONDANSETRON 16 MG/50ML IVPB (CHCC)
INTRAVENOUS | Status: AC
  Filled 2013-08-16: qty 16

## 2013-08-16 MED ORDER — FAMOTIDINE IN NACL 20-0.9 MG/50ML-% IV SOLN
20.0000 mg | Freq: Once | INTRAVENOUS | Status: AC
  Administered 2013-08-16: 20 mg via INTRAVENOUS

## 2013-08-16 MED ORDER — DIPHENHYDRAMINE HCL 50 MG/ML IJ SOLN
25.0000 mg | Freq: Once | INTRAMUSCULAR | Status: AC
  Administered 2013-08-16: 09:00:00 via INTRAVENOUS

## 2013-08-16 NOTE — Progress Notes (Signed)
Taxol started at 1000 @ 77mls/hr X 7 mls.  1015: pt tolerating well, rate increased to 77mls/hr X 87mls. 1030: pt tolerating well, rate increased to 45mls/hr X 21 mls. Pt ambulating in infusion room. 1045: Pt ambulating in infusion room at intervals-denies any discomfort, itching or SOB, Rate increased to 164mls/hr X 28 mls

## 2013-08-16 NOTE — Patient Instructions (Signed)
[facility] Discharge Instructions for Patients Receiving Chemotherapy  Today you received the following chemotherapy agents: Taxol/ Carboplatin  To help prevent nausea and vomiting after your treatment, we encourage you to take your nausea medication as needed.   If you develop nausea and vomiting that is not controlled by your nausea medication, call the clinic.   BELOW ARE SYMPTOMS THAT SHOULD BE REPORTED IMMEDIATELY:  *FEVER GREATER THAN 100.5 F  *CHILLS WITH OR WITHOUT FEVER  NAUSEA AND VOMITING THAT IS NOT CONTROLLED WITH YOUR NAUSEA MEDICATION  *UNUSUAL SHORTNESS OF BREATH  *UNUSUAL BRUISING OR BLEEDING  TENDERNESS IN MOUTH AND THROAT WITH OR WITHOUT PRESENCE OF ULCERS  *URINARY PROBLEMS  *BOWEL PROBLEMS  UNUSUAL RASH Items with * indicate a potential emergency and should be followed up as soon as possible.  Feel free to call the clinic you have any questions or concerns. The clinic phone number is [phone].    

## 2013-08-17 ENCOUNTER — Ambulatory Visit (HOSPITAL_BASED_OUTPATIENT_CLINIC_OR_DEPARTMENT_OTHER): Payer: Medicare Other

## 2013-08-17 VITALS — BP 118/56 | HR 86 | Temp 97.6°F | Resp 20

## 2013-08-17 DIAGNOSIS — C801 Malignant (primary) neoplasm, unspecified: Secondary | ICD-10-CM | POA: Diagnosis not present

## 2013-08-17 DIAGNOSIS — C779 Secondary and unspecified malignant neoplasm of lymph node, unspecified: Secondary | ICD-10-CM

## 2013-08-17 DIAGNOSIS — C569 Malignant neoplasm of unspecified ovary: Secondary | ICD-10-CM

## 2013-08-17 DIAGNOSIS — Z5189 Encounter for other specified aftercare: Secondary | ICD-10-CM | POA: Diagnosis not present

## 2013-08-17 DIAGNOSIS — C50919 Malignant neoplasm of unspecified site of unspecified female breast: Secondary | ICD-10-CM

## 2013-08-17 MED ORDER — PEGFILGRASTIM INJECTION 6 MG/0.6ML
6.0000 mg | Freq: Once | SUBCUTANEOUS | Status: AC
  Administered 2013-08-17: 6 mg via SUBCUTANEOUS

## 2013-08-19 ENCOUNTER — Telehealth: Payer: Self-pay | Admitting: *Deleted

## 2013-08-19 ENCOUNTER — Other Ambulatory Visit: Payer: Self-pay

## 2013-08-19 ENCOUNTER — Encounter (HOSPITAL_BASED_OUTPATIENT_CLINIC_OR_DEPARTMENT_OTHER): Payer: Medicare Other

## 2013-08-19 NOTE — Telephone Encounter (Signed)
Message copied by Cherylynn Ridges on Mon Aug 19, 2013  2:28 PM ------      Message from: Sharlynn Oliphant A      Created: Fri Aug 16, 2013  9:29 AM      Regarding: Chemo follow up call       1st Taxol Carbo on Friday ------

## 2013-08-19 NOTE — Telephone Encounter (Signed)
Called patient's home number x. 3  No answer and no answering machine.  Asked to "enter remote access code".  Called work number but she is not in today.  Will continue try to call another day.

## 2013-08-20 ENCOUNTER — Telehealth: Payer: Self-pay | Admitting: *Deleted

## 2013-08-20 DIAGNOSIS — R1909 Other intra-abdominal and pelvic swelling, mass and lump: Secondary | ICD-10-CM | POA: Insufficient documentation

## 2013-08-20 DIAGNOSIS — IMO0001 Reserved for inherently not codable concepts without codable children: Secondary | ICD-10-CM | POA: Insufficient documentation

## 2013-08-20 NOTE — Telephone Encounter (Signed)
Left VM reporting cold symptoms and voice is hoarse. Temp 99.0. Attempted to return call to her work # and got voice mail-did not leave message (was church inbox). Called home # and after several rings recording said to enter remote code access.

## 2013-08-20 NOTE — Telephone Encounter (Signed)
   Provider input needed: *hoarse, diarrhea   Reason for call: advice for treatment  Ears, nose, mouth, throat, and face: positive for hoarseness Gastrointestinal: positive for diarrhea   ALLERGIES:  has No Known Allergies.  Patient last received chemotherapy/ treatment on 1st taxol/carbo on 08/16/13  Patient was last seen in the office on 08/14/13  Next appt is 09/06/13  Is patient having fevers greater than 100.5?  No, She does have a thermometer and her temp is 99.   Is patient having uncontrolled pain, or new pain? no   Is patient having new back pain that changes with position (worsens or eases when laying down?)  no   Is patient able to eat and drink? yes    Is patient able to pass stool without difficulty?   yes     Is patient having uncontrolled nausea?  no    patient calls 08/20/2013 with complaint of  Ears, nose, mouth, throat, and face: positive for hoarseness, She is c/o hoarseness with a slight runny nose and and occassional cough. Gastrointestinal: positive for diarrhea, She has had 2 loose stools today.  She denies any pain or cramping with this.   Summary Based on the above information advised patient to  Take the Immodium that she already has on hand.  She is to let us know if this does not help.  She needs to stay well hydrated.   She can take benadryl or claritin that she already has on hand.  She thinks it may be allergy.  She will continue to check her temperature and call us if temp is 100.5 or more or if she has hard shaking chills.  This was discussed with Dr. Benay Spice and he does not think it is related to her chemotherapy.   Gretchen Short R  08/20/2013, 4:04 PM   Background Info  [name]   DOB: [birth year]   MR#: [id number]   CSN#   [id number] 08/20/2013

## 2013-08-20 NOTE — Telephone Encounter (Signed)
Unable to reach patient today but do note patient has called [facility] and left message.

## 2013-08-21 ENCOUNTER — Ambulatory Visit: Payer: Medicare Other | Admitting: Oncology

## 2013-08-22 DIAGNOSIS — E039 Hypothyroidism, unspecified: Secondary | ICD-10-CM | POA: Diagnosis not present

## 2013-08-22 DIAGNOSIS — E040 Nontoxic diffuse goiter: Secondary | ICD-10-CM | POA: Diagnosis not present

## 2013-08-23 ENCOUNTER — Telehealth: Payer: Self-pay | Admitting: *Deleted

## 2013-08-23 DIAGNOSIS — E039 Hypothyroidism, unspecified: Secondary | ICD-10-CM | POA: Diagnosis not present

## 2013-08-23 NOTE — Telephone Encounter (Signed)
Received message from pt asking "I have a cough and wanted to make sure it's OK to get an OTC med for this?"  Per Dr. Benay Spice; spoke with pt informing her that MD said OK to get OTC cough med.  Pt verbalized understanding and expressed appreciation for call.

## 2013-08-26 ENCOUNTER — Ambulatory Visit: Payer: Medicare Other | Admitting: Oncology

## 2013-08-27 ENCOUNTER — Other Ambulatory Visit (HOSPITAL_BASED_OUTPATIENT_CLINIC_OR_DEPARTMENT_OTHER): Payer: Medicare Other

## 2013-08-27 DIAGNOSIS — C50919 Malignant neoplasm of unspecified site of unspecified female breast: Secondary | ICD-10-CM | POA: Diagnosis not present

## 2013-08-27 DIAGNOSIS — C801 Malignant (primary) neoplasm, unspecified: Secondary | ICD-10-CM | POA: Diagnosis not present

## 2013-08-27 DIAGNOSIS — C569 Malignant neoplasm of unspecified ovary: Secondary | ICD-10-CM

## 2013-08-27 DIAGNOSIS — C779 Secondary and unspecified malignant neoplasm of lymph node, unspecified: Secondary | ICD-10-CM

## 2013-08-27 LAB — CBC WITH DIFFERENTIAL/PLATELET
BASO%: 0.3 % (ref 0.0–2.0)
BASOS ABS: 0 10*3/uL (ref 0.0–0.1)
EOS ABS: 0.1 10*3/uL (ref 0.0–0.5)
EOS%: 0.4 % (ref 0.0–7.0)
HCT: 33.8 % — ABNORMAL LOW (ref 34.8–46.6)
HEMOGLOBIN: 11.5 g/dL — AB (ref 11.6–15.9)
LYMPH#: 1.9 10*3/uL (ref 0.9–3.3)
LYMPH%: 14.7 % (ref 14.0–49.7)
MCH: 31.8 pg (ref 25.1–34.0)
MCHC: 33.9 g/dL (ref 31.5–36.0)
MCV: 94 fL (ref 79.5–101.0)
MONO#: 0.8 10*3/uL (ref 0.1–0.9)
MONO%: 5.9 % (ref 0.0–14.0)
NEUT#: 10.1 10*3/uL — ABNORMAL HIGH (ref 1.5–6.5)
NEUT%: 78.7 % — ABNORMAL HIGH (ref 38.4–76.8)
Platelets: 219 10*3/uL (ref 145–400)
RBC: 3.6 10*6/uL — ABNORMAL LOW (ref 3.70–5.45)
RDW: 12.8 % (ref 11.2–14.5)
WBC: 12.8 10*3/uL — AB (ref 3.9–10.3)

## 2013-09-01 ENCOUNTER — Other Ambulatory Visit: Payer: Self-pay | Admitting: Oncology

## 2013-09-04 ENCOUNTER — Ambulatory Visit: Payer: Medicare Other | Attending: Gynecologic Oncology | Admitting: Gynecologic Oncology

## 2013-09-04 ENCOUNTER — Encounter: Payer: Self-pay | Admitting: Gynecologic Oncology

## 2013-09-04 VITALS — BP 144/70 | HR 105 | Temp 98.3°F | Resp 16 | Ht 67.4 in | Wt 130.8 lb

## 2013-09-04 DIAGNOSIS — R1900 Intra-abdominal and pelvic swelling, mass and lump, unspecified site: Secondary | ICD-10-CM | POA: Insufficient documentation

## 2013-09-04 DIAGNOSIS — C569 Malignant neoplasm of unspecified ovary: Secondary | ICD-10-CM | POA: Diagnosis not present

## 2013-09-04 DIAGNOSIS — C50919 Malignant neoplasm of unspecified site of unspecified female breast: Secondary | ICD-10-CM | POA: Diagnosis not present

## 2013-09-04 NOTE — Patient Instructions (Signed)
Please contact Dr. Elenora Gamma office at [phone] after you have completed your CT scan after cycle 3.  Call for any questions or concerns.

## 2013-09-04 NOTE — Progress Notes (Signed)
Consult Note: Gyn-Onc  [name] 68 y.o. female  CC:  Chief Complaint  Patient presents with  . Cancer    HPI: Patient is seen today in consultation at the request of Dr. Barry Dienes.   Patient 68 year old gravida 0 who in 1988 underwent a total dominant hysterectomy bilateral salpingo-oophorectomy by Dr. Ree Edman. 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 Her washings were negative. CA 125 at the time of diagnosis was 33. She's been doing well without significant complaints since that time. She's had excellent routine medical care. For the past several months she felt that her stomach "old age" in the shower and felt that she was gaining weight. She scratched her abdomen felt that she felt a mass and had some discomfort and she went to see her primary care physician Dr. Gerarda Fraction. A CT scan of the chest abdomen and pelvis was ordered on 08/06/2013 but revealed:  FINDINGS:  Negative lung bases. No pericardial or pleural effusion. No acute osseous abnormality identified. Advanced L4-L5 disc degeneration. Lobulated soft tissue mass arising just to the right of midline in the lower abdomen and suprapubic region, inseparable from both caudal rectus muscles, more so the right. Approaching the mass the right rectus muscle is expanded (series 2, image 57. The mass is multilobulated and encompasses 55 x 79 x 97 cm.There is a narrow fat plane between the mass and underlying small bowel and bladder within the pelvis. No inguinal or pelvic sidewall lymphadenopathy. No pelvic free fluid. Uterus and adnexa not identified and appear to be surgically absent. Negative distal colon except for retained stool and diverticulosis. Left colon, transverse colon, right colon within normal limits. Appendix not identified. Oral contrast has almost reached the terminal ileum. No dilated small bowel. Fairly decompressed stomach. Negative  duodenum. Surgically absent gallbladder. Mild intra and extrahepatic biliary ductal enlargement likely postoperative. Normal liver enhancement. Spleen, pancreas, and adrenal glands are normal. Portal venous system within normal limits. Major arterial structures in the abdomen and pelvis are patent. Aortoiliac calcified atherosclerosis noted. No abdominal free fluid. No lymphadenopathy. Kidneys within normal limits; small left upper pole low-density area with simple fluid densitometry.  IMPRESSION:  1. Lower abdominal/suprapubic ventral wall lobulated soft tissue mass, arising within the lower rectus musculature more so on the right. 5.5 x 7.9 x 9.7 cm. Favor sarcoma or other connective tissue tumor. This should be amenable to percutaneous biopsy. In a younger female patient, endometriosis within a Cesarean section scar would also be a consideration for this appearance.  2. Narrow fat plane between the mass in #1 and underlying small bowel and bladder. No lymphadenopathy. No ascites.  3. Surgically absent gallbladder, uterus, and adnexa. Diverticulosis of the colon.  She underwent a core biopsy of the lesion on March 17 that revealed: Diagnosis Soft Tissue Needle Core Biopsy, abdominal wall mass - METASTATIC ADENOCARCINOMA INVOLVING SOFT TISSUE, SEE COMMENT. Microscopic Comment Needle core biopsies demonstrate diffuse involvement by well differentiated adenocarcinoma. The adenocarcinoma has the following immunophenotype: Cytokeratin 7 - strong diffuse expression Cytokeratin 20 - negative expression CDX2 - negative expression TTF-1 - negative expression Estrogen receptor - negative expression Vimentin - patchy mild to moderate expression The clinical history of hysterectomy for "possible endometrial cancer" is noted. The morphology and immunophenotype of the current biopsies is consistent with metastatic adenocarcinoma. Given the clinical history, metastasis from a gynecological primary is a  consideration. However, metastasis from a primary lung,  upper gastrointestinal tract or pancreatic-biliary tumor is not definitively excluded.  With a biopsy was not definitive, given her past history as well no other primary site was felt that this very well could be a recurrence of her primary ovarian tumor. Use in the area of her prior transverse skin incision. There is a discussion with Dr. Barry Dienes, Dr. Malachy Mood, plastic surgery, and myself it was felt that it would be a fairly radical dissection to proceed with surgery at this time and the decision was made to approach her with neoadjuvant chemotherapy. Her CA 125 was elevated at the time of cycle #1 of chemotherapy at 128. She's another cycle of chemotherapy scheduled for April 24. She's overall tolerating her chemotherapy quite well. She had diarrhea for one day presented by his been feeling well. She is up-to-date on her mammograms. Her colonoscopy 2-3 years ago and was recommended she'll follow up in 5 years. Family history significant for paternal grandmother with breast cancer in her 71s. She has 2 sisters who have had melanoma felt to be due to sun exposure. The patient herself does see a dermatologist.  Review of Systems  Constitutional: No weight changes. Denies fever. Skin: No rash, sores, jaundice, itching, or dryness.  Cardiovascular: No chest pain, shortness of breath, or edema  Pulmonary: No cough or wheeze.  Gastro Intestinal: Reporting intermittent lower abdominal soreness.  No nausea, vomiting, constipation, or diarrhea reported. No bright red blood per rectum or change in bowel movement.  Genitourinary: No frequency, urgency, or dysuria.  Denies vaginal bleeding and discharge.  Musculoskeletal: No myalgia, arthralgia, joint swelling or pain.  Neurologic: No weakness, numbness, or change in gait.  Psychology: No changes   Current Meds:  Outpatient Encounter Prescriptions as of 09/04/2013  Medication Sig  . Calcium  Carb-Cholecalciferol (CALCIUM 600 + D PO) Take 1 tablet by mouth daily.  . Cholecalciferol (EQL VITAMIN D3) 1000 UNITS tablet Take 1,000 Units by mouth daily.  Marland Kitchen dexamethasone (DECADRON) 4 MG tablet Take 2.5 tablets (10 mg) 10 PM the night before chemo. 2.5 tabs 6AM the morning of chemo  . estrogens, conjugated, (PREMARIN) 0.625 MG tablet Take 0.625 mg by mouth 3 (three) times a week.  Marland Kitchen ibuprofen (ADVIL,MOTRIN) 200 MG tablet Take 200 mg by mouth as needed for fever.   . levothyroxine (SYNTHROID, LEVOTHROID) 75 MCG tablet Take 75 mcg by mouth daily.   . Multiple Vitamins-Minerals (MULTIVITAMIN WITH MINERALS) tablet Take 1 tablet by mouth daily.   . Omega-3 Fatty Acids (FISH OIL) 1200 MG CAPS Take 1 capsule by mouth daily.   Marland Kitchen acetaminophen (TYLENOL) 500 MG tablet Take 500 mg by mouth as needed for fever.   . Biotin 5000 MCG CAPS Take 5,000 mcg by mouth.   . [DISCONTINUED] Magnesium 250 MG TABS Take 1 tablet by mouth daily as needed (for restless legs).  . [DISCONTINUED] Probiotic Product (PROBIOTIC PO) Take 1 tablet by mouth daily.   . [DISCONTINUED] prochlorperazine (COMPAZINE) 10 MG tablet Take 1 tablet (10 mg total) by mouth every 6 (six) hours as needed for nausea or vomiting.    Allergy: No Known Allergies  Social Hx:   History   Social History  . Marital Status: Single    Spouse Name: N/A    Number of Children: N/A  . Years of Education: N/A   Occupational History  . Not on file.   Social History Main Topics  . Smoking status: Never Smoker   . Smokeless tobacco: Not on file  . Alcohol  Use: No  . Drug Use: No  . Sexual Activity: No     Comment: HYST   Other Topics Concern  . 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 Tonsillectomy    . Cholecystectomy    . Oophorectomy      BSO  . Total abdominal hysterectomy  1988    ovarian cyst  BSO  . Tonsillectomy      Past Medical Hx:  Past Medical History  Diagnosis Date  . Ovarian cyst   . Thyroid disease     Oncology Hx:   No history exists.    Family Hx:  Family History  Problem Relation Age of Onset  . Heart disease Mother   . Heart disease Sister   . Diabetes Sister   . Cancer Sister     melinoma-skin cancer  . Breast cancer Paternal Grandmother     Age 30's  . Cancer Paternal Grandmother     breast  . Cancer Paternal Grandfather     prostate/bladder    Vitals:  Blood pressure 144/70, pulse 105, temperature 98.3 F (36.8 C), temperature source Oral, resp. rate 16, height 5' 7.4" (1.712 m), weight 130 lb 12.8 oz (59.33 kg).  Physical Exam: Well-nourished well-developed female who appears younger than stated age in no acute distress.   Neck: Supple, no lymphadenopathy no thyromegaly.  Lungs: Clear to auscultation bilaterally.  Cardiac: Regular rate and rhythm.  Abdomen: Well-healed transverse Pfannenstiel skin incision. Just inferior it was a right-sided within the transverse skin incision there is a 10 x 4 cm mass palpable. There is no fluid wave is no other palpable masses. There is no hepatomegaly.  Groins: No lymphadenopathy.  Extremities: No edema.  Pelvic: Normal external female genitalia. Bimanual examination reveals no masses or nodularity. Rectal confirms.  Assessment/Plan: 68 year old with a history of adenocarcinoma within the left ovary found incidentally at the time of a total abdominal hysterectomy bilateral salpingo-oophorectomy. This was in 1988. She did not require any additional treatment. I do not have a final pathology report. This was an incidental note made in her discharge summary. We are treating this as an ovarian cancer recurrence with a neoadjuvant approach to try to decrease his surgical  morbidity robotic or section of her abdominal wall. She appears to be tolerating her chemotherapy quite well. The plan that we have previously discussed is that she would proceed with 3 cycles of chemotherapy. We will plan on imaging after her third cycle we should be to the week of May 11 of May 18. She will call us once she has a CT scan performed we can look up the results and confer with Dr. Barry Dienes regarding the timing of surgery. She will followup with Dr. Benay Spice as scheduled. Her questions were elicited and answered to her satisfaction.  Greater than 20 minutes face to face time was spent in coordination and discussion of care. This is in addition to her history and  physical examination.  Dorthea Maina A. Alycia Rossetti, MD 09/04/2013, 3:27 PM

## 2013-09-05 ENCOUNTER — Telehealth: Payer: Self-pay | Admitting: *Deleted

## 2013-09-05 NOTE — Telephone Encounter (Signed)
Patient called asking if she needed to take the decadron again with her second cycle of chemotherapy.  Let her know that she does not need to take it with this second cycle.  She appreciated the call back.

## 2013-09-06 ENCOUNTER — Other Ambulatory Visit (HOSPITAL_BASED_OUTPATIENT_CLINIC_OR_DEPARTMENT_OTHER): Payer: Medicare Other

## 2013-09-06 ENCOUNTER — Ambulatory Visit (HOSPITAL_BASED_OUTPATIENT_CLINIC_OR_DEPARTMENT_OTHER): Payer: Medicare Other | Admitting: Nurse Practitioner

## 2013-09-06 ENCOUNTER — Ambulatory Visit (HOSPITAL_BASED_OUTPATIENT_CLINIC_OR_DEPARTMENT_OTHER): Payer: Medicare Other

## 2013-09-06 ENCOUNTER — Telehealth: Payer: Self-pay | Admitting: Oncology

## 2013-09-06 VITALS — BP 132/67 | HR 97 | Temp 97.6°F | Resp 20 | Ht 67.4 in | Wt 131.9 lb

## 2013-09-06 DIAGNOSIS — C779 Secondary and unspecified malignant neoplasm of lymph node, unspecified: Secondary | ICD-10-CM

## 2013-09-06 DIAGNOSIS — C801 Malignant (primary) neoplasm, unspecified: Secondary | ICD-10-CM

## 2013-09-06 DIAGNOSIS — C50919 Malignant neoplasm of unspecified site of unspecified female breast: Secondary | ICD-10-CM | POA: Diagnosis not present

## 2013-09-06 DIAGNOSIS — C44509 Unspecified malignant neoplasm of skin of other part of trunk: Secondary | ICD-10-CM

## 2013-09-06 DIAGNOSIS — R971 Elevated cancer antigen 125 [CA 125]: Secondary | ICD-10-CM

## 2013-09-06 DIAGNOSIS — C569 Malignant neoplasm of unspecified ovary: Secondary | ICD-10-CM

## 2013-09-06 DIAGNOSIS — Z8543 Personal history of malignant neoplasm of ovary: Secondary | ICD-10-CM

## 2013-09-06 DIAGNOSIS — Z5111 Encounter for antineoplastic chemotherapy: Secondary | ICD-10-CM

## 2013-09-06 LAB — CBC WITH DIFFERENTIAL/PLATELET
BASO%: 0.3 % (ref 0.0–2.0)
BASOS ABS: 0 10*3/uL (ref 0.0–0.1)
EOS%: 0.3 % (ref 0.0–7.0)
Eosinophils Absolute: 0 10*3/uL (ref 0.0–0.5)
HEMATOCRIT: 35.2 % (ref 34.8–46.6)
HEMOGLOBIN: 12 g/dL (ref 11.6–15.9)
LYMPH#: 1.5 10*3/uL (ref 0.9–3.3)
LYMPH%: 25.3 % (ref 14.0–49.7)
MCH: 32.3 pg (ref 25.1–34.0)
MCHC: 34.1 g/dL (ref 31.5–36.0)
MCV: 94.7 fL (ref 79.5–101.0)
MONO#: 0.7 10*3/uL (ref 0.1–0.9)
MONO%: 11.4 % (ref 0.0–14.0)
NEUT#: 3.8 10*3/uL (ref 1.5–6.5)
NEUT%: 62.7 % (ref 38.4–76.8)
PLATELETS: 301 10*3/uL (ref 145–400)
RBC: 3.71 10*6/uL (ref 3.70–5.45)
RDW: 13 % (ref 11.2–14.5)
WBC: 6 10*3/uL (ref 3.9–10.3)

## 2013-09-06 LAB — COMPREHENSIVE METABOLIC PANEL (CC13)
ALT: 11 U/L (ref 0–55)
AST: 19 U/L (ref 5–34)
Albumin: 3.6 g/dL (ref 3.5–5.0)
Alkaline Phosphatase: 64 U/L (ref 40–150)
Anion Gap: 11 mEq/L (ref 3–11)
BILIRUBIN TOTAL: 0.39 mg/dL (ref 0.20–1.20)
BUN: 14.9 mg/dL (ref 7.0–26.0)
CALCIUM: 10.3 mg/dL (ref 8.4–10.4)
CHLORIDE: 104 meq/L (ref 98–109)
CO2: 27 mEq/L (ref 22–29)
CREATININE: 0.8 mg/dL (ref 0.6–1.1)
Glucose: 65 mg/dl — ABNORMAL LOW (ref 70–140)
Potassium: 4.3 mEq/L (ref 3.5–5.1)
Sodium: 142 mEq/L (ref 136–145)
Total Protein: 7 g/dL (ref 6.4–8.3)

## 2013-09-06 MED ORDER — DEXAMETHASONE SODIUM PHOSPHATE 20 MG/5ML IJ SOLN
20.0000 mg | Freq: Once | INTRAMUSCULAR | Status: AC
  Administered 2013-09-06: 20 mg via INTRAVENOUS

## 2013-09-06 MED ORDER — DIPHENHYDRAMINE HCL 50 MG/ML IJ SOLN
25.0000 mg | Freq: Once | INTRAMUSCULAR | Status: AC
  Administered 2013-09-06: 25 mg via INTRAVENOUS

## 2013-09-06 MED ORDER — LORAZEPAM 1 MG PO TABS
0.5000 mg | ORAL_TABLET | Freq: Once | ORAL | Status: AC
  Administered 2013-09-06: 0.5 mg via ORAL

## 2013-09-06 MED ORDER — PACLITAXEL CHEMO INJECTION 300 MG/50ML
175.0000 mg/m2 | Freq: Once | INTRAVENOUS | Status: AC
  Administered 2013-09-06: 282 mg via INTRAVENOUS
  Filled 2013-09-06: qty 47

## 2013-09-06 MED ORDER — ONDANSETRON 16 MG/50ML IVPB (CHCC)
16.0000 mg | Freq: Once | INTRAVENOUS | Status: AC
  Administered 2013-09-06: 16 mg via INTRAVENOUS

## 2013-09-06 MED ORDER — DIPHENHYDRAMINE HCL 50 MG/ML IJ SOLN
INTRAMUSCULAR | Status: AC
  Filled 2013-09-06: qty 1

## 2013-09-06 MED ORDER — ONDANSETRON 16 MG/50ML IVPB (CHCC)
INTRAVENOUS | Status: AC
  Filled 2013-09-06: qty 16

## 2013-09-06 MED ORDER — DEXAMETHASONE SODIUM PHOSPHATE 20 MG/5ML IJ SOLN
INTRAMUSCULAR | Status: AC
  Filled 2013-09-06: qty 5

## 2013-09-06 MED ORDER — SODIUM CHLORIDE 0.9 % IV SOLN
Freq: Once | INTRAVENOUS | Status: AC
  Administered 2013-09-06: 12:00:00 via INTRAVENOUS

## 2013-09-06 MED ORDER — FAMOTIDINE IN NACL 20-0.9 MG/50ML-% IV SOLN
INTRAVENOUS | Status: AC
  Filled 2013-09-06: qty 50

## 2013-09-06 MED ORDER — FAMOTIDINE IN NACL 20-0.9 MG/50ML-% IV SOLN
20.0000 mg | Freq: Once | INTRAVENOUS | Status: AC
  Administered 2013-09-06: 20 mg via INTRAVENOUS

## 2013-09-06 MED ORDER — SODIUM CHLORIDE 0.9 % IV SOLN
383.0000 mg | Freq: Once | INTRAVENOUS | Status: AC
  Administered 2013-09-06: 380 mg via INTRAVENOUS
  Filled 2013-09-06: qty 38

## 2013-09-06 MED ORDER — LORAZEPAM 1 MG PO TABS
ORAL_TABLET | ORAL | Status: AC
  Filled 2013-09-06: qty 1

## 2013-09-06 NOTE — Patient Instructions (Signed)
[facility] Discharge Instructions for Patients Receiving Chemotherapy  Today you received the following chemotherapy agents: Taxol and Carboplatin.  To help prevent nausea and vomiting after your treatment, we encourage you to take your nausea medication as prescribed.   If you develop nausea and vomiting that is not controlled by your nausea medication, call the clinic.   BELOW ARE SYMPTOMS THAT SHOULD BE REPORTED IMMEDIATELY:  *FEVER GREATER THAN 100.5 F  *CHILLS WITH OR WITHOUT FEVER  NAUSEA AND VOMITING THAT IS NOT CONTROLLED WITH YOUR NAUSEA MEDICATION  *UNUSUAL SHORTNESS OF BREATH  *UNUSUAL BRUISING OR BLEEDING  TENDERNESS IN MOUTH AND THROAT WITH OR WITHOUT PRESENCE OF ULCERS  *URINARY PROBLEMS  *BOWEL PROBLEMS  UNUSUAL RASH Items with * indicate a potential emergency and should be followed up as soon as possible.  Feel free to call the clinic you have any questions or concerns. The clinic phone number is [phone].    

## 2013-09-06 NOTE — Telephone Encounter (Signed)
gv adn printed appt sched and avs fort pt for April/May/June....sed added tx.

## 2013-09-06 NOTE — Progress Notes (Signed)
  [facility] OFFICE PROGRESS NOTE   Diagnosis:  Adenocarcinoma.  INTERVAL HISTORY:   [name] returns as scheduled. She completed cycle 1 Taxol/carboplatin on 08/16/2013 with Neulasta support. She denies nausea/vomiting. No mouth sores. She has loose stools on one day. She had 2 episodes of mild numbness in the fingertips. She developed hoarseness and nasal congestion about 1 week after the chemotherapy. Symptoms lasted one week. She has a good appetite. She denies abdominal pain.  Objective:  Vital signs in last 24 hours:  Blood pressure 132/67, pulse 97, temperature 97.6 F (36.4 C), temperature source Oral, resp. rate 20, height 5' 7.4" (1.712 m), weight 131 lb 14.4 oz (59.829 kg).    HEENT: No thrush or ulcerations. Resp: Lungs clear. Cardio: Regular cardiac rhythm. GI: Soft and nontender. No hepatomegaly. Low midline abdominal mass measuring approximately 8 cm. Vascular: No leg edema. Neuro: Vibratory sense intact over the fingertips per tuning fork exam.     Lab Results:  Lab Results  Component Value Date   WBC 6.0 09/06/2013   HGB 12.0 09/06/2013   HCT 35.2 09/06/2013   MCV 94.7 09/06/2013   PLT 301 09/06/2013   NEUTROABS 3.8 09/06/2013    Imaging:  No results found.  Medications: I have reviewed the patient's current medications.  Assessment/Plan: 1. 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 Staging CTs of the chest, abdomen, and pelvis with no other site of metastatic disease or a primary tumor.  Elevated CA 125. Cycle 1 Taxol/carboplatin 08/16/2013. 2. Ovarian cancer in 1988, low-grade adenocarcinoma with areas of "borderline" carcinoma, status post a hysterectomy and bilateral oophorectomy.   Disposition: She appears stable. She has completed one cycle of Taxol/carboplatin. The abdominal mass is stable to slightly smaller.  Plan to proceed with cycle 2 Taxol/carboplatin today as scheduled. She will again receive  Neulasta support.  She will return for a followup visit and cycle 3 Taxol/carboplatin in 3 weeks. She will contact the office in the interim with any problems.  Plan reviewed with Dr. Benay Spice.    Owens Shark ANP/GNP-BC   09/06/2013  11:05 AM

## 2013-09-07 ENCOUNTER — Ambulatory Visit (HOSPITAL_BASED_OUTPATIENT_CLINIC_OR_DEPARTMENT_OTHER): Payer: Medicare Other

## 2013-09-07 VITALS — BP 111/63 | HR 78 | Temp 97.2°F | Resp 18

## 2013-09-07 DIAGNOSIS — Z5189 Encounter for other specified aftercare: Secondary | ICD-10-CM | POA: Diagnosis not present

## 2013-09-07 DIAGNOSIS — C801 Malignant (primary) neoplasm, unspecified: Secondary | ICD-10-CM

## 2013-09-07 DIAGNOSIS — C50919 Malignant neoplasm of unspecified site of unspecified female breast: Secondary | ICD-10-CM

## 2013-09-07 DIAGNOSIS — C779 Secondary and unspecified malignant neoplasm of lymph node, unspecified: Secondary | ICD-10-CM

## 2013-09-07 MED ORDER — PEGFILGRASTIM INJECTION 6 MG/0.6ML
6.0000 mg | Freq: Once | SUBCUTANEOUS | Status: AC
  Administered 2013-09-07: 6 mg via SUBCUTANEOUS

## 2013-09-11 ENCOUNTER — Encounter: Payer: Self-pay | Admitting: *Deleted

## 2013-09-19 ENCOUNTER — Encounter (INDEPENDENT_AMBULATORY_CARE_PROVIDER_SITE_OTHER): Payer: Self-pay

## 2013-09-20 ENCOUNTER — Other Ambulatory Visit: Payer: Self-pay | Admitting: Gynecologic Oncology

## 2013-09-20 DIAGNOSIS — B3731 Acute candidiasis of vulva and vagina: Secondary | ICD-10-CM

## 2013-09-20 DIAGNOSIS — B373 Candidiasis of vulva and vagina: Secondary | ICD-10-CM

## 2013-09-20 MED ORDER — FLUCONAZOLE 100 MG PO TABS: 100.0000 mg | ORAL_TABLET | Freq: Once | ORAL | Status: DC

## 2013-09-20 NOTE — Progress Notes (Signed)
Patient called with complaints of vaginal internal itching.  No discharge reported.  Requesting diflucan.  She is advised that prescription would be sent to her pharmacy and to call if symptoms do not resolve.

## 2013-09-22 ENCOUNTER — Other Ambulatory Visit: Payer: Self-pay | Admitting: Oncology

## 2013-09-26 ENCOUNTER — Encounter (INDEPENDENT_AMBULATORY_CARE_PROVIDER_SITE_OTHER): Payer: Self-pay

## 2013-09-27 ENCOUNTER — Telehealth: Payer: Self-pay | Admitting: Oncology

## 2013-09-27 ENCOUNTER — Other Ambulatory Visit: Payer: Self-pay

## 2013-09-27 ENCOUNTER — Telehealth: Payer: Self-pay | Admitting: *Deleted

## 2013-09-27 ENCOUNTER — Ambulatory Visit (HOSPITAL_BASED_OUTPATIENT_CLINIC_OR_DEPARTMENT_OTHER): Payer: Medicare Other

## 2013-09-27 ENCOUNTER — Other Ambulatory Visit (HOSPITAL_BASED_OUTPATIENT_CLINIC_OR_DEPARTMENT_OTHER): Payer: Medicare Other

## 2013-09-27 ENCOUNTER — Other Ambulatory Visit: Payer: Self-pay | Admitting: *Deleted

## 2013-09-27 ENCOUNTER — Ambulatory Visit (HOSPITAL_BASED_OUTPATIENT_CLINIC_OR_DEPARTMENT_OTHER): Payer: Medicare Other | Admitting: Nurse Practitioner

## 2013-09-27 VITALS — BP 126/51 | HR 91 | Temp 98.0°F | Resp 18 | Ht 67.4 in | Wt 133.1 lb

## 2013-09-27 DIAGNOSIS — C50919 Malignant neoplasm of unspecified site of unspecified female breast: Secondary | ICD-10-CM

## 2013-09-27 DIAGNOSIS — C44509 Unspecified malignant neoplasm of skin of other part of trunk: Secondary | ICD-10-CM

## 2013-09-27 DIAGNOSIS — Z8543 Personal history of malignant neoplasm of ovary: Secondary | ICD-10-CM

## 2013-09-27 DIAGNOSIS — C779 Secondary and unspecified malignant neoplasm of lymph node, unspecified: Secondary | ICD-10-CM | POA: Diagnosis not present

## 2013-09-27 DIAGNOSIS — Z5111 Encounter for antineoplastic chemotherapy: Secondary | ICD-10-CM | POA: Diagnosis not present

## 2013-09-27 DIAGNOSIS — C801 Malignant (primary) neoplasm, unspecified: Secondary | ICD-10-CM | POA: Diagnosis not present

## 2013-09-27 DIAGNOSIS — C494 Malignant neoplasm of connective and soft tissue of abdomen: Secondary | ICD-10-CM | POA: Diagnosis not present

## 2013-09-27 LAB — CBC WITH DIFFERENTIAL/PLATELET
BASO%: 0.2 % (ref 0.0–2.0)
Basophils Absolute: 0 10*3/uL (ref 0.0–0.1)
EOS%: 0.5 % (ref 0.0–7.0)
Eosinophils Absolute: 0 10*3/uL (ref 0.0–0.5)
HCT: 33.1 % — ABNORMAL LOW (ref 34.8–46.6)
HGB: 11.1 g/dL — ABNORMAL LOW (ref 11.6–15.9)
LYMPH%: 19.9 % (ref 14.0–49.7)
MCH: 32.1 pg (ref 25.1–34.0)
MCHC: 33.6 g/dL (ref 31.5–36.0)
MCV: 95.4 fL (ref 79.5–101.0)
MONO#: 0.6 10*3/uL (ref 0.1–0.9)
MONO%: 10.2 % (ref 0.0–14.0)
NEUT#: 4.4 10*3/uL (ref 1.5–6.5)
NEUT%: 69.2 % (ref 38.4–76.8)
Platelets: 215 10*3/uL (ref 145–400)
RBC: 3.47 10*6/uL — AB (ref 3.70–5.45)
RDW: 14.5 % (ref 11.2–14.5)
WBC: 6.4 10*3/uL (ref 3.9–10.3)
lymph#: 1.3 10*3/uL (ref 0.9–3.3)

## 2013-09-27 LAB — COMPREHENSIVE METABOLIC PANEL (CC13)
ALK PHOS: 73 U/L (ref 40–150)
ALT: 17 U/L (ref 0–55)
AST: 17 U/L (ref 5–34)
Albumin: 3.6 g/dL (ref 3.5–5.0)
Anion Gap: 11 mEq/L (ref 3–11)
BILIRUBIN TOTAL: 0.36 mg/dL (ref 0.20–1.20)
BUN: 15.9 mg/dL (ref 7.0–26.0)
CO2: 25 mEq/L (ref 22–29)
Calcium: 10 mg/dL (ref 8.4–10.4)
Chloride: 106 mEq/L (ref 98–109)
Creatinine: 0.8 mg/dL (ref 0.6–1.1)
Glucose: 59 mg/dl — ABNORMAL LOW (ref 70–140)
Potassium: 4.3 mEq/L (ref 3.5–5.1)
SODIUM: 142 meq/L (ref 136–145)
TOTAL PROTEIN: 6.9 g/dL (ref 6.4–8.3)

## 2013-09-27 LAB — CA 125: CA 125: 147.4 U/mL — ABNORMAL HIGH (ref 0.0–30.2)

## 2013-09-27 MED ORDER — PACLITAXEL CHEMO INJECTION 300 MG/50ML
175.0000 mg/m2 | Freq: Once | INTRAVENOUS | Status: AC
  Administered 2013-09-27: 282 mg via INTRAVENOUS
  Filled 2013-09-27: qty 47

## 2013-09-27 MED ORDER — FAMOTIDINE IN NACL 20-0.9 MG/50ML-% IV SOLN
INTRAVENOUS | Status: AC
  Filled 2013-09-27: qty 50

## 2013-09-27 MED ORDER — SODIUM CHLORIDE 0.9 % IV SOLN
Freq: Once | INTRAVENOUS | Status: AC
  Administered 2013-09-27: 13:00:00 via INTRAVENOUS

## 2013-09-27 MED ORDER — DEXAMETHASONE SODIUM PHOSPHATE 20 MG/5ML IJ SOLN
20.0000 mg | Freq: Once | INTRAMUSCULAR | Status: AC
  Administered 2013-09-27: 20 mg via INTRAVENOUS

## 2013-09-27 MED ORDER — CARBOPLATIN CHEMO INJECTION 450 MG/45ML
383.0000 mg | Freq: Once | INTRAVENOUS | Status: AC
  Administered 2013-09-27: 380 mg via INTRAVENOUS
  Filled 2013-09-27: qty 38

## 2013-09-27 MED ORDER — FAMOTIDINE IN NACL 20-0.9 MG/50ML-% IV SOLN
20.0000 mg | Freq: Once | INTRAVENOUS | Status: AC
  Administered 2013-09-27: 20 mg via INTRAVENOUS

## 2013-09-27 MED ORDER — DIPHENHYDRAMINE HCL 50 MG/ML IJ SOLN
25.0000 mg | Freq: Once | INTRAMUSCULAR | Status: AC
  Administered 2013-09-27: 25 mg via INTRAVENOUS

## 2013-09-27 MED ORDER — IBUPROFEN 200 MG PO TABS
400.0000 mg | ORAL_TABLET | Freq: Once | ORAL | Status: AC
  Administered 2013-09-27: 400 mg via ORAL

## 2013-09-27 MED ORDER — DEXAMETHASONE SODIUM PHOSPHATE 20 MG/5ML IJ SOLN
INTRAMUSCULAR | Status: AC
  Filled 2013-09-27: qty 5

## 2013-09-27 MED ORDER — ONDANSETRON 16 MG/50ML IVPB (CHCC)
INTRAVENOUS | Status: AC
  Filled 2013-09-27: qty 16

## 2013-09-27 MED ORDER — ONDANSETRON 16 MG/50ML IVPB (CHCC)
16.0000 mg | Freq: Once | INTRAVENOUS | Status: AC
  Administered 2013-09-27: 16 mg via INTRAVENOUS

## 2013-09-27 MED ORDER — DIPHENHYDRAMINE HCL 50 MG/ML IJ SOLN
INTRAMUSCULAR | Status: AC
  Filled 2013-09-27: qty 1

## 2013-09-27 NOTE — Progress Notes (Addendum)
  [facility] OFFICE PROGRESS NOTE   Diagnosis:  Adenocarcinoma.  INTERVAL HISTORY:   Ms. Graefe returns as scheduled. She completed cycle 2 Taxol/carboplatin on 09/06/2013 with Neulasta support. She denies nausea/vomiting. No mouth sores. No diarrhea. No numbness or tingling in her hands or feet. She noted "restless legs" following the premedications with cycle 2. She has a good appetite. She has intermittent pain at the left shoulder similar to when she had tendinitis in the past. She denies abdominal pain. No hematuria or dysuria. No fevers or sweats. No chills. She denies shortness of breath.  Objective:  Vital signs in last 24 hours:  Blood pressure 126/51, pulse 91, temperature 98 F (36.7 C), temperature source Oral, resp. rate 18, height 5' 7.4" (1.712 m), weight 133 lb 1.6 oz (60.374 kg), SpO2 100.00%.    HEENT: No thrush or ulcerations. Resp: Lungs clear. Cardio: Regular cardiac rhythm. GI: Abdomen soft and nontender. No hepatomegaly. Approximate 8 cm low midline abdominal mass. Vascular: No leg edema. Neuro: Vibratory sense intact over the fingertips per tuning fork exam.      Lab Results:  Lab Results  Component Value Date   WBC 6.4 09/27/2013   HGB 11.1* 09/27/2013   HCT 33.1* 09/27/2013   MCV 95.4 09/27/2013   PLT 215 09/27/2013   NEUTROABS 4.4 09/27/2013    Imaging:  No results found.  Medications: I have reviewed the patient's current medications.  Assessment/Plan: 1. 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 Staging CTs of the chest, abdomen, and pelvis with no other site of metastatic disease or a primary tumor.  Elevated CA 125.  Cycle 1 Taxol/carboplatin 08/16/2013. Cycle 2 Taxol/carboplatin 09/06/2013. 2. Ovarian cancer in 1988, low-grade adenocarcinoma with areas of "borderline" carcinoma, status post a hysterectomy and bilateral oophorectomy.   Disposition: She appears stable. The low abdominal mass may be  smaller. Plan to proceed with cycle 3 Taxol/carboplatin today as scheduled. We will followup on the CA 125 from today.  Plan for restaging CT evaluation in approximately 2 weeks. She will return for a followup visit with Dr. Benay Spice on 10/18/2013.  Patient seen with Dr. Benay Spice.    Owens Shark ANP/GNP-BC   09/27/2013  10:53 AM  This was a shared visit with Ned Card. She has completed 2 cycles of Taxol/carboplatin. The abdominal wall mass appears smaller. The plan is to proceed with cycle 3 today.  [name], M.D.

## 2013-09-27 NOTE — Patient Instructions (Addendum)
[facility] Discharge Instructions for Patients Receiving Chemotherapy  Today you received the following chemotherapy agents :  Taxol,  Carboplatin.  To help prevent nausea and vomiting after your treatment, we encourage you to take your nausea medication as needed as prescribed by your physician. Take Mirapex as prescribed to help with restless leg syndrome.   If you develop nausea and vomiting that is not controlled by your nausea medication, call the clinic.   BELOW ARE SYMPTOMS THAT SHOULD BE REPORTED IMMEDIATELY:  *FEVER GREATER THAN 100.5 F  *CHILLS WITH OR WITHOUT FEVER  NAUSEA AND VOMITING THAT IS NOT CONTROLLED WITH YOUR NAUSEA MEDICATION  *UNUSUAL SHORTNESS OF BREATH  *UNUSUAL BRUISING OR BLEEDING  TENDERNESS IN MOUTH AND THROAT WITH OR WITHOUT PRESENCE OF ULCERS  *URINARY PROBLEMS  *BOWEL PROBLEMS  UNUSUAL RASH Items with * indicate a potential emergency and should be followed up as soon as possible.  Feel free to call the clinic you have any questions or concerns. The clinic phone number is [phone].

## 2013-09-27 NOTE — Telephone Encounter (Signed)
Gave pt appt for lab and MD, emailed Sharyn Lull for june 2015

## 2013-09-27 NOTE — Telephone Encounter (Signed)
Per staff message and POF I have scheduled appts.  JMW  

## 2013-09-27 NOTE — Progress Notes (Signed)
1335 -  Pt c/o restless legs and pain causing by Benadryl as pre meds.  Offerred pt several times about receiving Ativan to help with symptoms.  Pt refused every time and stated she would rather take Ibuprofen for pain.  Amy Horton, RN desk nurse for Dr. Benay Spice notified.  Amy to relay message to Lattie Haw, NP ( pt saw Lattie Haw, NP and Dr. Benay Spice today prior to chemo ).  Order received for Ibuprofen 400mg  po to be given.  Meds given as ordered.  Rated pain at 7/10. Pt was monitored intermittently for pain improvement after Ibuprofen.  Pt was also given yellow mustard per pt's request. 3568 -  Pt stated pain was relieved somewhat.  Pt again refused Ativan offer.  Pt  Also stated she experienced restless leg syndrome and legs pain at home also.  Pt stated she was given Mirapex by her primary for restless legs, but pt has not taken at all.  Encouraged pt to take med as prescribed to see if she has relief from restless legs.  Pt voiced understanding and stated she would start taking Mirapex tonight.

## 2013-09-28 ENCOUNTER — Ambulatory Visit (HOSPITAL_BASED_OUTPATIENT_CLINIC_OR_DEPARTMENT_OTHER): Payer: Medicare Other

## 2013-09-28 VITALS — BP 105/53 | HR 82 | Temp 98.2°F | Resp 18

## 2013-09-28 DIAGNOSIS — Z5189 Encounter for other specified aftercare: Secondary | ICD-10-CM | POA: Diagnosis not present

## 2013-09-28 DIAGNOSIS — C801 Malignant (primary) neoplasm, unspecified: Secondary | ICD-10-CM | POA: Diagnosis not present

## 2013-09-28 MED ORDER — PEGFILGRASTIM INJECTION 6 MG/0.6ML
6.0000 mg | Freq: Once | SUBCUTANEOUS | Status: AC
  Administered 2013-09-28: 6 mg via SUBCUTANEOUS

## 2013-09-28 NOTE — Patient Instructions (Signed)

## 2013-09-30 ENCOUNTER — Encounter (INDEPENDENT_AMBULATORY_CARE_PROVIDER_SITE_OTHER): Payer: Self-pay

## 2013-10-01 ENCOUNTER — Telehealth: Payer: Self-pay | Admitting: Oncology

## 2013-10-01 NOTE — Telephone Encounter (Signed)
Called pt and left message regarding appt , mailed appt

## 2013-10-11 ENCOUNTER — Encounter (HOSPITAL_COMMUNITY): Payer: Self-pay

## 2013-10-11 ENCOUNTER — Ambulatory Visit (HOSPITAL_COMMUNITY)
Admission: RE | Admit: 2013-10-11 | Discharge: 2013-10-11 | Disposition: A | Discharge status: Home / Self Care | Payer: Medicare Other | Source: Ambulatory Visit | Attending: Nurse Practitioner | Admitting: Nurse Practitioner

## 2013-10-11 DIAGNOSIS — C762 Malignant neoplasm of abdomen: Secondary | ICD-10-CM | POA: Insufficient documentation

## 2013-10-11 DIAGNOSIS — I7090 Unspecified atherosclerosis: Secondary | ICD-10-CM | POA: Insufficient documentation

## 2013-10-11 DIAGNOSIS — M479 Spondylosis, unspecified: Secondary | ICD-10-CM | POA: Diagnosis not present

## 2013-10-11 DIAGNOSIS — C44509 Unspecified malignant neoplasm of skin of other part of trunk: Secondary | ICD-10-CM

## 2013-10-11 DIAGNOSIS — C801 Malignant (primary) neoplasm, unspecified: Secondary | ICD-10-CM

## 2013-10-11 MED ORDER — IOHEXOL 300 MG/ML  SOLN
100.0000 mL | Freq: Once | INTRAMUSCULAR | Status: AC | PRN
  Administered 2013-10-11: 100 mL via INTRAVENOUS

## 2013-10-13 ENCOUNTER — Other Ambulatory Visit: Payer: Self-pay | Admitting: Oncology

## 2013-10-18 ENCOUNTER — Ambulatory Visit: Payer: Medicare Other

## 2013-10-18 ENCOUNTER — Telehealth: Payer: Self-pay | Admitting: Oncology

## 2013-10-18 ENCOUNTER — Other Ambulatory Visit (HOSPITAL_BASED_OUTPATIENT_CLINIC_OR_DEPARTMENT_OTHER): Payer: Medicare Other

## 2013-10-18 ENCOUNTER — Ambulatory Visit (HOSPITAL_BASED_OUTPATIENT_CLINIC_OR_DEPARTMENT_OTHER): Payer: Medicare Other | Admitting: Oncology

## 2013-10-18 VITALS — BP 131/69 | HR 100 | Temp 97.6°F | Resp 18 | Ht 67.4 in | Wt 132.4 lb

## 2013-10-18 DIAGNOSIS — C801 Malignant (primary) neoplasm, unspecified: Secondary | ICD-10-CM

## 2013-10-18 DIAGNOSIS — C779 Secondary and unspecified malignant neoplasm of lymph node, unspecified: Secondary | ICD-10-CM | POA: Diagnosis not present

## 2013-10-18 DIAGNOSIS — C44509 Unspecified malignant neoplasm of skin of other part of trunk: Secondary | ICD-10-CM

## 2013-10-18 DIAGNOSIS — C50919 Malignant neoplasm of unspecified site of unspecified female breast: Secondary | ICD-10-CM | POA: Diagnosis not present

## 2013-10-18 DIAGNOSIS — Z8543 Personal history of malignant neoplasm of ovary: Secondary | ICD-10-CM

## 2013-10-18 LAB — CBC WITH DIFFERENTIAL/PLATELET
BASO%: 0.3 % (ref 0.0–2.0)
BASOS ABS: 0 10*3/uL (ref 0.0–0.1)
EOS ABS: 0 10*3/uL (ref 0.0–0.5)
EOS%: 0.3 % (ref 0.0–7.0)
HEMATOCRIT: 33.4 % — AB (ref 34.8–46.6)
HEMOGLOBIN: 11.3 g/dL — AB (ref 11.6–15.9)
LYMPH%: 24.8 % (ref 14.0–49.7)
MCH: 32.8 pg (ref 25.1–34.0)
MCHC: 33.8 g/dL (ref 31.5–36.0)
MCV: 97.1 fL (ref 79.5–101.0)
MONO#: 0.6 10*3/uL (ref 0.1–0.9)
MONO%: 8.3 % (ref 0.0–14.0)
NEUT%: 66.3 % (ref 38.4–76.8)
NEUTROS ABS: 4.6 10*3/uL (ref 1.5–6.5)
Platelets: 217 10*3/uL (ref 145–400)
RBC: 3.44 10*6/uL — ABNORMAL LOW (ref 3.70–5.45)
RDW: 15.2 % — ABNORMAL HIGH (ref 11.2–14.5)
WBC: 6.9 10*3/uL (ref 3.9–10.3)
lymph#: 1.7 10*3/uL (ref 0.9–3.3)

## 2013-10-18 LAB — COMPREHENSIVE METABOLIC PANEL (CC13)
ALK PHOS: 71 U/L (ref 40–150)
ALT: 16 U/L (ref 0–55)
AST: 19 U/L (ref 5–34)
Albumin: 3.9 g/dL (ref 3.5–5.0)
Anion Gap: 12 mEq/L — ABNORMAL HIGH (ref 3–11)
BILIRUBIN TOTAL: 0.26 mg/dL (ref 0.20–1.20)
BUN: 12.3 mg/dL (ref 7.0–26.0)
CO2: 22 mEq/L (ref 22–29)
CREATININE: 0.7 mg/dL (ref 0.6–1.1)
Calcium: 10.1 mg/dL (ref 8.4–10.4)
Chloride: 106 mEq/L (ref 98–109)
Glucose: 91 mg/dl (ref 70–140)
Potassium: 4.3 mEq/L (ref 3.5–5.1)
Sodium: 140 mEq/L (ref 136–145)
Total Protein: 7.2 g/dL (ref 6.4–8.3)

## 2013-10-18 NOTE — Telephone Encounter (Signed)
, °

## 2013-10-18 NOTE — Progress Notes (Signed)
  [facility] OFFICE PROGRESS NOTE   Diagnosis: Metastatic adenocarcinoma  INTERVAL HISTORY:   She completed a third cycle of Taxol/carboplatin 09/28/2011. She reports mild discomfort in the legs for several days following chemotherapy. No other complaint.  Objective:  Vital signs in last 24 hours:  Blood pressure 131/69, pulse 100, temperature 97.6 F (36.4 C), temperature source Oral, resp. rate 18, height 5' 7.4" (1.712 m), weight 132 lb 6.4 oz (60.056 kg), SpO2 100.00%.    HEENT: No thrush or ulcers Lymphatics: No inguinal nodes Resp: Lungs clear bilaterally Cardio: Regular rate and rhythm GI: No hepatosplenomegaly, low abdominal mass measuring approximately 9 cm in transverse dimension Vascular: No leg edema   Lab Results:  Lab Results  Component Value Date   WBC 6.9 10/18/2013   HGB 11.3* 10/18/2013   HCT 33.4* 10/18/2013   MCV 97.1 10/18/2013   PLT 217 10/18/2013   NEUTROABS 4.6 10/18/2013   CA 125-147.4 on 09/27/2013  Imaging: CT of the abdomen and pelvis 10/11/2013, compared to 07/19/2013-no pleural effusion. The inferior right rectus abdominis muscle mass measures 5.3 x 7.5 cm compared to 5.5 x 7.9 cm. No pathologically enlarged lymph nodes.  Medications: I have reviewed the patient's current medications.  Assessment/Plan: 1. 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 Staging CTs of the chest, abdomen, and pelvis with no other site of metastatic disease or a primary tumor.  Elevated CA 125.  Cycle 1 Taxol/carboplatin 08/16/2013.  Cycle 2 Taxol/carboplatin 09/06/2013. Cycle 3 Taxol/carboplatin 09/27/2013 CT 10/11/2013 with a stable abdominal wall mass  2. Ovarian cancer in 1988, low-grade adenocarcinoma with areas of "borderline" carcinoma, status post a hysterectomy and bilateral oophorectomy.  Disposition:  Ms. Manganelli appears stable. The abdominal mass has not changed significantly following 3 cycles of Taxol/carboplatin and  the CA 125 remains elevated. I discussed the case with Dr. Barry Dienes. We will hold further Taxol/carboplatin chemotherapy. I will refer her back to GYN oncology to plan surgical resection of the mass.  Ms. Biskup will be scheduled for an office visit in medical oncology in 2 months. We will see her sooner as needed.  Ladell Pier, MD  10/18/2013  12:26 PM

## 2013-10-18 NOTE — Patient Instructions (Signed)
**  Please bring copy of your Advanced Directive to office at next visit to be scanned in your medical record**

## 2013-10-19 ENCOUNTER — Ambulatory Visit: Payer: Medicare Other

## 2013-10-21 ENCOUNTER — Encounter (INDEPENDENT_AMBULATORY_CARE_PROVIDER_SITE_OTHER): Payer: Self-pay

## 2013-10-22 ENCOUNTER — Telehealth: Payer: Self-pay | Admitting: *Deleted

## 2013-10-22 NOTE — Telephone Encounter (Signed)
Called Dr. Marlowe Aschoff office s/w [place] to discuss pt surgery Resection of Pevic Mass on  6/23 at [facility].  Dr. Barry Dienes able to assist Dr. Alycia Rossetti. Case # [id number]  given to [facility].

## 2013-10-23 ENCOUNTER — Telehealth: Payer: Self-pay | Admitting: *Deleted

## 2013-10-23 NOTE — Telephone Encounter (Signed)
Pt called confirmed appt on 6/18 and or 6/23

## 2013-10-29 ENCOUNTER — Encounter (HOSPITAL_COMMUNITY): Payer: Self-pay | Admitting: Pharmacy Technician

## 2013-10-31 ENCOUNTER — Ambulatory Visit: Payer: Medicare Other | Attending: Gynecologic Oncology | Admitting: Gynecologic Oncology

## 2013-10-31 ENCOUNTER — Encounter: Payer: Self-pay | Admitting: Gynecologic Oncology

## 2013-10-31 VITALS — BP 132/61 | HR 72 | Temp 98.4°F | Resp 18 | Ht 67.0 in | Wt 132.6 lb

## 2013-10-31 DIAGNOSIS — Z79899 Other long term (current) drug therapy: Secondary | ICD-10-CM | POA: Insufficient documentation

## 2013-10-31 DIAGNOSIS — Z90710 Acquired absence of both cervix and uterus: Secondary | ICD-10-CM | POA: Insufficient documentation

## 2013-10-31 DIAGNOSIS — C50919 Malignant neoplasm of unspecified site of unspecified female breast: Secondary | ICD-10-CM

## 2013-10-31 DIAGNOSIS — Z8543 Personal history of malignant neoplasm of ovary: Secondary | ICD-10-CM | POA: Diagnosis not present

## 2013-10-31 DIAGNOSIS — Z9221 Personal history of antineoplastic chemotherapy: Secondary | ICD-10-CM | POA: Diagnosis not present

## 2013-10-31 DIAGNOSIS — C779 Secondary and unspecified malignant neoplasm of lymph node, unspecified: Secondary | ICD-10-CM

## 2013-10-31 DIAGNOSIS — R1900 Intra-abdominal and pelvic swelling, mass and lump, unspecified site: Secondary | ICD-10-CM

## 2013-10-31 NOTE — Progress Notes (Signed)
Consult Note: Gyn-Onc  [name] 68 y.o. female  CC:  Chief Complaint  Patient presents with  . Abdominal mass    Follow up    HPI: Patient is seen today in consultation at the request of Dr. Barry Dienes.   Patient 68 year old gravida 0 who in 1988 underwent a total dominant hysterectomy bilateral salpingo-oophorectomy by Dr. Ree Edman. 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 Her washings were negative. CA 125 at the time of diagnosis was 33. She's been doing well without significant complaints since that time. She's had excellent routine medical care. For the past several months she felt that her stomach "old age" in the shower and felt that she was gaining weight. She scratched her abdomen felt that she felt a mass and had some discomfort and she went to see her primary care physician Dr. Gerarda Fraction. A CT scan of the chest abdomen and pelvis was ordered on 08/06/2013 but revealed:  FINDINGS:  Negative lung bases. No pericardial or pleural effusion. No acute osseous abnormality identified. Advanced L4-L5 disc degeneration. Lobulated soft tissue mass arising just to the right of midline in the lower abdomen and suprapubic region, inseparable from both caudal rectus muscles, more so the right. Approaching the mass the right rectus muscle is expanded (series 2, image 57. The mass is multilobulated and encompasses 55 x 79 x 97 cm.There is a narrow fat plane between the mass and underlying small bowel and bladder within the pelvis. No inguinal or pelvic sidewall lymphadenopathy. No pelvic free fluid. Uterus and adnexa not identified and appear to be surgically absent. Negative distal colon except for retained stool and diverticulosis. Left colon, transverse colon, right colon within normal limits. Appendix not identified. Oral contrast has almost reached the terminal ileum. No dilated small bowel. Fairly  decompressed stomach. Negative duodenum. Surgically absent gallbladder. Mild intra and extrahepatic biliary ductal enlargement likely postoperative. Normal liver enhancement. Spleen, pancreas, and adrenal glands are normal. Portal venous system within normal limits. Major arterial structures in the abdomen and pelvis are patent. Aortoiliac calcified atherosclerosis noted. No abdominal free fluid. No lymphadenopathy. Kidneys within normal limits; small left upper pole low-density area with simple fluid densitometry.  IMPRESSION:  1. Lower abdominal/suprapubic ventral wall lobulated soft tissue mass, arising within the lower rectus musculature more so on the right. 5.5 x 7.9 x 9.7 cm. Favor sarcoma or other connective tissue tumor. This should be amenable to percutaneous biopsy. In a younger female patient, endometriosis within a Cesarean section scar would also be a consideration for this appearance.  2. Narrow fat plane between the mass in #1 and underlying small bowel and bladder. No lymphadenopathy. No ascites.  3. Surgically absent gallbladder, uterus, and adnexa. Diverticulosis of the colon.  She underwent a core biopsy of the lesion on March 17 that revealed: Diagnosis Soft Tissue Needle Core Biopsy, abdominal wall mass - METASTATIC ADENOCARCINOMA INVOLVING SOFT TISSUE, SEE COMMENT. Microscopic Comment Needle core biopsies demonstrate diffuse involvement by well differentiated adenocarcinoma. The adenocarcinoma has the following immunophenotype: Cytokeratin 7 - strong diffuse expression Cytokeratin 20 - negative expression CDX2 - negative expression TTF-1 - negative expression Estrogen receptor - negative expression Vimentin - patchy mild to moderate expression The clinical history of hysterectomy for "possible endometrial cancer" is noted. The morphology and immunophenotype of the current biopsies is consistent with metastatic adenocarcinoma. Given the clinical history, metastasis from a  gynecological primary is a consideration.  However, metastasis from a primary lung, upper gastrointestinal tract or pancreatic-biliary tumor is not definitively excluded.  With a biopsy was not definitive, given her past history as well no other primary site was felt that this very well could be a recurrence of her primary ovarian tumor. Use in the area of her prior transverse skin incision. There is a discussion with Dr. Barry Dienes, Dr. Benay Spice, plastic surgery, and myself it was felt that it would be a fairly radical dissection to proceed with surgery at this time and the decision was made to approach her with neoadjuvant chemotherapy. Her CA 125 was elevated at the time of cycle #1 of chemotherapy at 128. She's another cycle of chemotherapy scheduled for April 24. She's overall tolerating her chemotherapy quite well. She had diarrhea for one day presented by his been feeling well. She is up-to-date on her mammograms. Her colonoscopy 2-3 years ago and was recommended she'll follow up in 5 years. Family history significant for paternal grandmother with breast cancer in her 61s. She has 2 sisters who have had melanoma felt to be due to sun exposure. The patient herself does see a dermatologist.  She's completed her third cycle of paclitaxel and carboplatin on Sep 28 2011. Her CA 125 at that time was 147 which is not significantly changed from her pre-chemotherapy value of 128. She had a CT scan that revealed: FINDINGS:  Lung bases show no acute findings. Heart size normal. No pericardial or pleural effusion. Liver is unremarkable. Cholecystectomy with stable intrahepatic and extrahepatic biliary duct prominence. Adrenal glands and right kidney are unremarkable. Low-attenuation lesions in the left kidney measure up to 1.4 cm, as before and are likely cysts. Kidneys, spleen, pancreas, stomach and bowel are unremarkable. A heterogeneous mass centered in the inferior right rectus abdominus muscle measures 5.3 x 7.5 cm  (previously 5.5 x 7.9 cm). No pathologically enlarged lymph nodes. Scattered atherosclerotic calcification of the arterial vasculature without abdominal aortic aneurysm. No free fluid. No worrisome lytic or sclerotic lesions.  Degenerative changes are seen in the spine.  IMPRESSION:  Right rectus abdominus mass is stable to very minimally smaller than on 07/19/2013.  After discussion and consideration she scheduled for a debulking surgery for removal of this mass as it appears to be her primary site of disease on June 23 with Dr. Barry Dienes as well as Dr. Iran Planas from plastic surgery. She comes in today for preoperative discussion.  She tolerated her chemotherapy very well. With the first cycle she might have a little bit of neuropathy in her fingers but has had none since then. She had no nausea or vomiting. Just slight fatigue.   Review of Systems  Constitutional: No weight changes. Denies fever. Skin: No rash, sores, jaundice, itching, or dryness.  Cardiovascular: No chest pain, shortness of breath, or edema  Pulmonary: No cough or wheeze.  Gastro Intestinal: Reporting intermittent lower abdominal soreness.  No nausea, vomiting, constipation, or diarrhea reported. No bright red blood per rectum or change in bowel movement.  Genitourinary: No frequency, urgency, or dysuria.  Denies vaginal bleeding and discharge.  Musculoskeletal: No myalgia, arthralgia, joint swelling or pain.  Neurologic: No weakness, numbness, or change in gait.  Psychology: No changes   Current Meds:  Outpatient Encounter Prescriptions as of 10/31/2013  Medication Sig  . Calcium Carb-Cholecalciferol (CALCIUM 600 + D PO) Take 1 tablet by mouth daily.  . Cholecalciferol (EQL VITAMIN D3) 1000 UNITS tablet Take 1,000 Units by mouth daily.  Marland Kitchen estrogens, conjugated, (PREMARIN) 0.625  MG tablet Take 0.625 mg by mouth every Monday, Wednesday, and Friday.   Marland Kitchen ibuprofen (ADVIL,MOTRIN) 200 MG tablet Take 200 mg by mouth every 4  (four) hours as needed for fever.   . levothyroxine (SYNTHROID, LEVOTHROID) 75 MCG tablet Take 75 mcg by mouth every morning.   . Multiple Vitamins-Minerals (MULTIVITAMIN WITH MINERALS) tablet Take 1 tablet by mouth daily.   . Omega-3 Fatty Acids (FISH OIL) 1200 MG CAPS Take 1,200 mg by mouth daily.     Allergy: No Known Allergies  Social Hx:   History   Social History  . Marital Status: Single    Spouse Name: N/A    Number of Children: N/A  . Years of Education: N/A   Occupational History  . Not on file.   Social History Main Topics  . Smoking status: Never Smoker   . Smokeless tobacco: Not on file  . Alcohol Use: No  . Drug Use: No  . Sexual Activity: No     Comment: HYST   Other Topics Concern  . 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 Tonsillectomy    . Cholecystectomy    . Oophorectomy      BSO  . Total abdominal hysterectomy  1988    ovarian cyst  BSO  . Tonsillectomy      Past Medical Hx:  Past Medical History  Diagnosis Date  . Ovarian cyst   . Thyroid disease     Oncology Hx:    Adenocarcinoma of abdominal wall, unclear primary   08/09/2013 Initial Diagnosis Adenocarcinoma of abdominal wall, unclear primary   08/16/2013 -  Chemotherapy paclitaxel and carboplatin    Family Hx:  Family History  Problem Relation Age of Onset  . Heart disease Mother   . Heart disease Sister   . Diabetes Sister   . Cancer Sister     melinoma-skin cancer  . Breast cancer Paternal Grandmother     Age 87's  . Cancer Paternal Grandmother     breast  . Cancer Paternal Grandfather     prostate/bladder    Vitals:  Blood pressure 132/61, pulse 72, temperature 98.4 F (36.9 C), temperature source Oral,  resp. rate 18, height 5\' 7"  (1.702 m), weight 132 lb 9.6 oz (60.147 kg).  Physical Exam: Well-nourished well-developed female who appears younger than stated age in no acute distress.   Neck: Supple, no lymphadenopathy no thyromegaly.  Lungs: Clear to auscultation bilaterally.  Cardiac: Regular rate and rhythm.  Abdomen: Well-healed transverse Pfannenstiel skin incision. Just inferior it was a right-sided within the transverse skin incision there is a 10 x 4 cm mass palpable. There is no fluid wave is no other palpable masses. There is no hepatomegaly.  Assessment/Plan: 68 year old with a history of adenocarcinoma within the left ovary found incidentally at the time of a total abdominal hysterectomy bilateral salpingo-oophorectomy. This was in 1988. She did not require any additional treatment. I do not have a final pathology report. This was an incidental note made in her discharge summary. We are treating this as an ovarian cancer recurrence with a neoadjuvant approach.  The mass has not had a significant improvement with regards to size. Therefore we  believe that surgical resection would be of benefit for her. She is scheduled for surgery with Dr. Barry Dienes myself and Dr. Iran Planas on June 23.  Risks of surgery including but not limited to injury to surrounding organs, hernia, wound infection both superficial and deep. injury to surrounding organs and thromboembolic disease were discussed with the patient. Her questions were elicited in answered to her satisfaction. I believe we can use her transverse skin incision for the procedure that we'll be able to close the skin over a period we discussed the possibility of needing to have a mesh placed. I also discussed with her that she may need to have a drain in postoperatively. Her sister was a further discussion and did not have any other questions.  GEHRIG,PAOLA A., MD 10/31/2013, 12:12 PM

## 2013-10-31 NOTE — Patient Instructions (Addendum)
[place]  10/31/2013   Your procedure is scheduled on: Tuesday June 23rd, 2015  Report to [facility] Main Entrance and follow signs to  [place] at 530 AM.  Call this number if you have problems the morning of surgery [phone]   Remember:  Do not eat food  :After Midnight Sunday night, clear liquids all day Monday 11-04-13, no clear liquids after midnight Monday 11-04-13.    Take these medicines the morning of surgery with A SIP OF WATER: synthroid                               You may not have any metal on your body including hair pins and piercings  Do not wear jewelry, make-up, lotions, powders, or deodorant.   Men may shave face and neck.  Do not bring valuables to the hospital. [place].  Contacts, dentures or bridgework may not be worn into surgery.  Leave suitcase in the car. After surgery it may be brought to your room.  For patients admitted to the hospital, checkout time is 11:00 AM the day of discharge.  ________________________________________________________________________  [facility] - Preparing for Surgery Before surgery, you can play an important role.  Because skin is not sterile, your skin needs to be as free of germs as possible.  You can reduce the number of germs on your skin by washing with CHG (chlorahexidine gluconate) soap before surgery.  CHG is an antiseptic cleaner which kills germs and bonds with the skin to continue killing germs even after washing. Please DO NOT use if you have an allergy to CHG or antibacterial soaps.  If your skin becomes reddened/irritated stop using the CHG and inform your nurse when you arrive at Short Stay. Do not shave (including legs and underarms) for at least 48 hours prior to the first CHG shower.  You may shave your face/neck. Please follow these instructions carefully:  1.  Shower with CHG Soap the night before surgery and the  morning of Surgery.  2.  If you  choose to wash your hair, wash your hair first as usual with your  normal  shampoo.  3.  After you shampoo, rinse your hair and body thoroughly to remove the  shampoo.                            4.  Use CHG as you would any other liquid soap.  You can apply chg directly  to the skin and wash                       Gently with a scrungie or clean washcloth.  5.  Apply the CHG Soap to your body ONLY FROM THE NECK DOWN.   Do not use on face/ open                           Wound or open sores. Avoid contact with eyes, ears mouth and genitals (private parts).                       Wash face,  Genitals (private parts) with your normal soap.             6.  Wash thoroughly, paying special attention to the  area where your surgery  will be performed.  7.  Thoroughly rinse your body with warm water from the neck down.  8.  DO NOT shower/wash with your normal soap after using and rinsing off  the CHG Soap.                9.  Pat yourself dry with a clean towel.            10.  Wear clean pajamas.            11.  Place clean sheets on your bed the night of your first shower and do not  sleep with pets. Day of Surgery : Do not apply any lotions/deodorants the morning of surgery.  Please wear clean clothes to the hospital/surgery center.  FAILURE TO FOLLOW THESE INSTRUCTIONS MAY RESULT IN THE CANCELLATION OF YOUR SURGERY PATIENT SIGNATURE_________________________________  NURSE SIGNATURE__________________________________  ________________________________________________________________________    CLEAR LIQUID DIET   Foods Allowed                                                                     Foods Excluded  Coffee and tea, regular and decaf                             liquids that you cannot  Plain Jell-O in any flavor                                             see through such as: Fruit ices (not with fruit pulp)                                     milk, soups, orange juice  Iced Popsicles                                     All solid food Carbonated beverages, regular and diet                                    Cranberry, grape and apple juices Sports drinks like Gatorade Lightly seasoned clear broth or consume(fat free) Sugar, honey syrup  Sample Menu Breakfast                                Lunch                                     Supper Cranberry juice                    Beef broth                            Chicken  broth Jell-O                                     Grape juice                           Apple juice Coffee or tea                        Jell-O                                      Popsicle                                                Coffee or tea                        Coffee or tea  _____________________________________________________________________    Incentive Spirometer  An incentive spirometer is a tool that can help keep your lungs clear and active. This tool measures how well you are filling your lungs with each breath. Taking long deep breaths may help reverse or decrease the chance of developing breathing (pulmonary) problems (especially infection) following:  A long period of time when you are unable to move or be active. BEFORE THE PROCEDURE   If the spirometer includes an indicator to show your best effort, your nurse or respiratory therapist will set it to a desired goal.  If possible, sit up straight or lean slightly forward. Try not to slouch.  Hold the incentive spirometer in an upright position. INSTRUCTIONS FOR USE  1. Sit on the edge of your bed if possible, or sit up as far as you can in bed or on a chair. 2. Hold the incentive spirometer in an upright position. 3. Breathe out normally. 4. Place the mouthpiece in your mouth and seal your lips tightly around it. 5. Breathe in slowly and as deeply as possible, raising the piston or the ball toward the top of the column. 6. Hold your breath for 3-5 seconds or for as long as  possible. Allow the piston or ball to fall to the bottom of the column. 7. Remove the mouthpiece from your mouth and breathe out normally. 8. Rest for a few seconds and repeat Steps 1 through 7 at least 10 times every 1-2 hours when you are awake. Take your time and take a few normal breaths between deep breaths. 9. The spirometer may include an indicator to show your best effort. Use the indicator as a goal to work toward during each repetition. 10. After each set of 10 deep breaths, practice coughing to be sure your lungs are clear. If you have an incision (the cut made at the time of surgery), support your incision when coughing by placing a pillow or rolled up towels firmly against it. Once you are able to get out of bed, walk around indoors and cough well. You may stop using the incentive spirometer when instructed by your caregiver.  RISKS AND COMPLICATIONS  Take your time so you do not get dizzy or light-headed.  If you are in pain, you may  need to take or ask for pain medication before doing incentive spirometry. It is harder to take a deep breath if you are having pain. AFTER USE  Rest and breathe slowly and easily.  It can be helpful to keep track of a log of your progress. Your caregiver can provide you with a simple table to help with this. If you are using the spirometer at home, follow these instructions: [place] IF:   You are having difficultly using the spirometer.  You have trouble using the spirometer as often as instructed.  Your pain medication is not giving enough relief while using the spirometer.  You develop fever of 100.5 F (38.1 C) or higher. SEEK IMMEDIATE MEDICAL CARE IF:   You cough up bloody sputum that had not been present before.  You develop fever of 102 F (38.9 C) or greater.  You develop worsening pain at or near the incision site. MAKE SURE YOU:   Understand these instructions.  Will watch your condition.  Will get help right  away if you are not doing well or get worse. Document Released: 09/12/2006 Document Revised: 07/25/2011 Document Reviewed: 11/13/2006 ExitCare Patient Information 2014 ExitCare, [place].   ________________________________________________________________________  WHAT IS A BLOOD TRANSFUSION? Blood Transfusion Information  A transfusion is the replacement of blood or some of its parts. Blood is made up of multiple cells which provide different functions.  Red blood cells carry oxygen and are used for blood loss replacement.  White blood cells fight against infection.  Platelets control bleeding.  Plasma helps clot blood.  Other blood products are available for specialized needs, such as hemophilia or other clotting disorders. BEFORE THE TRANSFUSION  Who gives blood for transfusions?   Healthy volunteers who are fully evaluated to make sure their blood is safe. This is blood bank blood. Transfusion therapy is the safest it has ever been in the practice of medicine. Before blood is taken from a donor, a complete history is taken to make sure that person has no history of diseases nor engages in risky social behavior (examples are intravenous drug use or sexual activity with multiple partners). The donor's travel history is screened to minimize risk of transmitting infections, such as malaria. The donated blood is tested for signs of infectious diseases, such as HIV and hepatitis. The blood is then tested to be sure it is compatible with you in order to minimize the chance of a transfusion reaction. If you or a relative donates blood, this is often done in anticipation of surgery and is not appropriate for emergency situations. It takes many days to process the donated blood. RISKS AND COMPLICATIONS Although transfusion therapy is very safe and saves many lives, the main dangers of transfusion include:   Getting an infectious disease.  Developing a transfusion reaction. This is an allergic  reaction to something in the blood you were given. Every precaution is taken to prevent this. The decision to have a blood transfusion has been considered carefully by your caregiver before blood is given. Blood is not given unless the benefits outweigh the risks. AFTER THE TRANSFUSION  Right after receiving a blood transfusion, you will usually feel much better and more energetic. This is especially true if your red blood cells have gotten low (anemic). The transfusion raises the level of the red blood cells which carry oxygen, and this usually causes an energy increase.  The nurse administering the transfusion will monitor you carefully for complications. HOME CARE INSTRUCTIONS  No special  instructions are needed after a transfusion. You may find your energy is better. Speak with your caregiver about any limitations on activity for underlying diseases you may have. SEEK MEDICAL CARE IF:   Your condition is not improving after your transfusion.  You develop redness or irritation at the intravenous (IV) site. SEEK IMMEDIATE MEDICAL CARE IF:  Any of the following symptoms occur over the next 12 hours:  Shaking chills.  You have a temperature by mouth above 102 F (38.9 C), not controlled by medicine.  Chest, back, or muscle pain.  People around you feel you are not acting correctly or are confused.  Shortness of breath or difficulty breathing.  Dizziness and fainting.  You get a rash or develop hives.  You have a decrease in urine output.  Your urine turns a dark color or changes to pink, red, or brown. Any of the following symptoms occur over the next 10 days:  You have a temperature by mouth above 102 F (38.9 C), not controlled by medicine.  Shortness of breath.  Weakness after normal activity.  The white part of the eye turns yellow (jaundice).  You have a decrease in the amount of urine or are urinating less often.  Your urine turns a dark color or changes to pink,  red, or brown. Document Released: 04/29/2000 Document Revised: 07/25/2011 Document Reviewed: 12/17/2007 [facility] Patient Information 2014 [place], [place].  _______________________________________________________________________

## 2013-10-31 NOTE — Progress Notes (Signed)
Cbc with dif and cmet 10-18-13 epic

## 2013-10-31 NOTE — Patient Instructions (Signed)
You are scheduled for surgery on 6/23 with Dr. Alycia Rossetti.                Preparing for your Surgery  Pre-operative Testing -You will receive a phone call from presurgical testing at [facility] to arrange for a pre-operative testing appointment before your surgery.  This appointment normally occurs one to two weeks before your scheduled surgery.   -Bring your insurance card, copy of an advanced directive if applicable, medication list  -At that visit, you will be asked to sign a consent for a possible blood transfusion in case a transfusion becomes necessary during surgery.  The need for a blood transfusion is rare but having consent is a necessary part of your care.     Day Before Surgery at [facility] will be asked to take in only clear liquids the day before surgery.  Examples of clear liquids include broths, jello, and clear juices.  You may also be advised to perform a Miralax bowel prep or fleets enema the night before your surgery based off of your provider's recommendations.  You will be advised to have nothing to eat or drink after midnight the evening before.    Your role in recovery Your role is to become active as soon as directed by your doctor, while still giving yourself time to heal.  Rest when you feel tired. You will be asked to do the following in order to speed your recovery:  - Cough and breathe deeply. This helps toclear and expand your lungs and can prevent pneumonia. You may be given a spirometer to practice deep breathing. A staff member will show you how to use the spirometer. - Do mild physical activity. Walking or moving your legs help your circulation and body functions return to normal. A staff member will help you when you try to walk and will provide you with simple exercises. Do not try to get up or walk alone the first time. - Actively manage your pain. Managing your pain lets you move in comfort. We will ask you to rate your pain on a scale of zero to 10. It  is your responsibility to tell your doctor or nurse where and how much you hurt so your pain can be treated.  Special Considerations -If you are diabetic, you may be placed on insulin after surgery to have closer control over your blood sugars to promote healing and recovery.  This does not mean that you will be discharged on insulin.  If applicable, your oral antidiabetics will be resumed when you are tolerating a solid diet.  -Your final pathology results from surgery should be available by the Friday after surgery and the results will be relayed to you when available.

## 2013-11-01 ENCOUNTER — Ambulatory Visit (HOSPITAL_COMMUNITY)
Admission: RE | Admit: 2013-11-01 | Discharge: 2013-11-01 | Disposition: A | Discharge status: Home / Self Care | Payer: Medicare Other | Source: Ambulatory Visit | Attending: Gynecologic Oncology | Admitting: Gynecologic Oncology

## 2013-11-01 ENCOUNTER — Encounter (HOSPITAL_COMMUNITY): Payer: Self-pay

## 2013-11-01 ENCOUNTER — Encounter (HOSPITAL_COMMUNITY)
Admission: RE | Admit: 2013-11-01 | Discharge: 2013-11-01 | Disposition: A | Discharge status: Home / Self Care | Payer: Medicare Other | Source: Ambulatory Visit | Attending: Gynecologic Oncology | Admitting: Gynecologic Oncology

## 2013-11-01 DIAGNOSIS — Z01818 Encounter for other preprocedural examination: Secondary | ICD-10-CM | POA: Insufficient documentation

## 2013-11-01 DIAGNOSIS — C801 Malignant (primary) neoplasm, unspecified: Secondary | ICD-10-CM | POA: Diagnosis not present

## 2013-11-01 DIAGNOSIS — Z01812 Encounter for preprocedural laboratory examination: Secondary | ICD-10-CM | POA: Diagnosis not present

## 2013-11-01 HISTORY — DX: Other specified postprocedural states: R11.2

## 2013-11-01 HISTORY — DX: Gastro-esophageal reflux disease without esophagitis: K21.9

## 2013-11-01 HISTORY — DX: Other specified postprocedural states: Z98.890

## 2013-11-01 HISTORY — DX: Restless legs syndrome: G25.81

## 2013-11-01 HISTORY — DX: Malignant (primary) neoplasm, unspecified: C80.1

## 2013-11-01 HISTORY — DX: Pure hypercholesterolemia, unspecified: E78.00

## 2013-11-01 LAB — CBC WITH DIFFERENTIAL/PLATELET
Basophils Absolute: 0 10*3/uL (ref 0.0–0.1)
Basophils Relative: 0 % (ref 0–1)
EOS ABS: 0 10*3/uL (ref 0.0–0.7)
Eosinophils Relative: 1 % (ref 0–5)
HCT: 32.9 % — ABNORMAL LOW (ref 36.0–46.0)
HEMOGLOBIN: 10.9 g/dL — AB (ref 12.0–15.0)
Lymphocytes Relative: 22 % (ref 12–46)
Lymphs Abs: 1.6 10*3/uL (ref 0.7–4.0)
MCH: 32.1 pg (ref 26.0–34.0)
MCHC: 33.1 g/dL (ref 30.0–36.0)
MCV: 96.8 fL (ref 78.0–100.0)
MONOS PCT: 9 % (ref 3–12)
Monocytes Absolute: 0.6 10*3/uL (ref 0.1–1.0)
NEUTROS ABS: 5 10*3/uL (ref 1.7–7.7)
NEUTROS PCT: 68 % (ref 43–77)
Platelets: 250 10*3/uL (ref 150–400)
RBC: 3.4 MIL/uL — AB (ref 3.87–5.11)
RDW: 14.5 % (ref 11.5–15.5)
WBC: 7.3 10*3/uL (ref 4.0–10.5)

## 2013-11-01 LAB — URINALYSIS, ROUTINE W REFLEX MICROSCOPIC
Bilirubin Urine: NEGATIVE
GLUCOSE, UA: NEGATIVE mg/dL
KETONES UR: NEGATIVE mg/dL
Leukocytes, UA: NEGATIVE
Nitrite: NEGATIVE
PROTEIN: NEGATIVE mg/dL
Specific Gravity, Urine: 1.005 (ref 1.005–1.030)
Urobilinogen, UA: 0.2 mg/dL (ref 0.0–1.0)
pH: 7.5 (ref 5.0–8.0)

## 2013-11-01 LAB — COMPREHENSIVE METABOLIC PANEL
ALK PHOS: 60 U/L (ref 39–117)
ALT: 13 U/L (ref 0–35)
AST: 19 U/L (ref 0–37)
Albumin: 3.6 g/dL (ref 3.5–5.2)
BUN: 14 mg/dL (ref 6–23)
CHLORIDE: 102 meq/L (ref 96–112)
CO2: 28 mEq/L (ref 19–32)
Calcium: 10.1 mg/dL (ref 8.4–10.5)
Creatinine, Ser: 0.75 mg/dL (ref 0.50–1.10)
GFR calc non Af Amer: 86 mL/min — ABNORMAL LOW (ref >90)
Glucose, Bld: 100 mg/dL — ABNORMAL HIGH (ref 70–99)
POTASSIUM: 4.7 meq/L (ref 3.7–5.3)
Sodium: 140 mEq/L (ref 137–147)
Total Protein: 7.2 g/dL (ref 6.0–8.3)

## 2013-11-01 LAB — URINE MICROSCOPIC-ADD ON

## 2013-11-01 LAB — ABO/RH: ABO/RH(D): A NEG

## 2013-11-04 NOTE — Anesthesia Preprocedure Evaluation (Addendum)
Anesthesia Evaluation  Patient identified by MRN, date of birth, ID band Patient awake    Reviewed: Allergy & Precautions, H&P , NPO status , Patient's Chart, lab work & pertinent test results  History of Anesthesia Complications (+) PONV  Airway Mallampati: II TM Distance: >3 FB     Dental  (+) Teeth Intact, Caps, Dental Advisory Given,    Pulmonary neg pulmonary ROS,  breath sounds clear to auscultation  Pulmonary exam normal       Cardiovascular negative cardio ROS  Rhythm:Regular Rate:Normal     Neuro/Psych negative neurological ROS  negative psych ROS   GI/Hepatic Neg liver ROS, GERD-  ,  Endo/Other  Hypothyroidism   Renal/GU negative Renal ROS  negative genitourinary   Musculoskeletal negative musculoskeletal ROS (+)   Abdominal   Peds  Hematology negative hematology ROS (+)   Anesthesia Other Findings   Reproductive/Obstetrics Metastatic adenocarcinoma                           Anesthesia Physical Anesthesia Plan  ASA: III  Anesthesia Plan: General   Post-op Pain Management:    Induction: Intravenous  Airway Management Planned: Oral ETT  Additional Equipment:   Intra-op Plan:   Post-operative Plan: Extubation in OR  Informed Consent: I have reviewed the patients History and Physical, chart, labs and discussed the procedure including the risks, benefits and alternatives for the proposed anesthesia with the patient or authorized representative who has indicated his/her understanding and acceptance.   Dental advisory given  Plan Discussed with: CRNA  Anesthesia Plan Comments:         Anesthesia Quick Evaluation

## 2013-11-04 NOTE — H&P (Signed)
Subjective:    Patient ID: [name] is a 68 y.o. female.  HPI Patient referred by Dr Barry Dienes for evaluation. She noted abdominal mass appr 2 months ago and CT demonstrated mass in the right rectus muscle. Biopsy showed adenocarcinoma of unknown primary. Her scans do not demonstrate any GI masses or lung masses. Review of chart states she did have an ovarian cancer in 1988 and this is felt to be a late recurrence of ovarian ca. She had a total abdominal hysterectomy with oophorectomy in 1988 for ovarian cancer, no adjuvant treatment. She denies pain. She denies any change in her bowel habits or weight loss. She is accompanied by her first cousin and her sister. She has started on neoadjuvant chemotherapy first week of April, tolerated well. Seeing Dr Alycia Rossetti today.   LABORATORY RESULTS:  CA 125 (08/07/13) 128.1 (*)   Pathology  Diagnosis  Soft Tissue Needle Core Biopsy, abdominal wall mass  - METASTATIC ADENOCARCINOMA INVOLVING SOFT TISSUE, SEE COMMENT.  The clinical history of hysterectomy for "possible endometrial cancer" is noted. The morphology and  immunophenotype of the current biopsies is consistent with metastatic adenocarcinoma. Given the clinical  history, metastasis from a gynecological primary is a consideration. However, metastasis from a primary lung,  upper gastrointestinal tract or pancreatic-biliary tumor is not definitively excluded. The case was reviewed with  Dr. Saralyn Pilar, who concurs. (CRR:caf 08/01/13)  Following case signout and after discussion with oncology, the patient stated a questionable history  of primary adnexal cancer. As such, an additional immunostain was performed. The tumor does not  express WT-1 immunostain. The result was discussed with Dr. Benay Spice on 08/08/13. (CRR:gt, 08/08/13)    CLINICAL DATA: 68 year old female with right lower quadrant pain  and swelling. Initial encounter. Cholecystectomy in 2009. Prior  hysterectomy.  EXAM:  CT  ABDOMEN AND PELVIS WITH CONTRAST  TECHNIQUE:  Multidetector CT imaging of the abdomen and pelvis was performed  using the standard protocol following bolus administration of  intravenous contrast.  CONTRAST: 130mL OMNIPAQUE IOHEXOL 300 MG/ML SOLN  COMPARISON: Abdomen ultrasound 09/14/2006.  FINDINGS:  Negative lung bases. No pericardial or pleural effusion.  No acute osseous abnormality identified. Advanced L4-L5 disc  degeneration.  Lobulated soft tissue mass arising just to the right of midline in  the lower abdomen and suprapubic region, inseparable from both  caudal rectus muscles, more so the right. Approaching the mass the  right rectus muscle is expanded (series 2, image 57. The mass is  multilobulated and encompasses 55 x 79 x 97 cm (AP by transverse by  CC, series 2, image 62, series 5, image 51).  There is a narrow fat plane between the mass and underlying small  bowel and bladder within the pelvis. No inguinal or pelvic sidewall  lymphadenopathy. No pelvic free fluid. Uterus and adnexa not  identified and appear to be surgically absent.  Negative distal colon except for retained stool and diverticulosis.  Left colon, transverse colon, right colon within normal limits.  Appendix not identified. Oral contrast has almost reached the  terminal ileum. No dilated small bowel. Fairly decompressed stomach.  Negative duodenum.  Surgically absent gallbladder. Mild intra and extrahepatic biliary  ductal enlargement likely postoperative. Normal liver enhancement.  Spleen, pancreas, and adrenal glands are normal. Portal venous  system within normal limits. Major arterial structures in the  abdomen and pelvis are patent. Aortoiliac calcified atherosclerosis  noted. No abdominal free fluid. No lymphadenopathy. Kidneys within  normal limits; small left upper pole low-density area  with simple  fluid densitometry.  IMPRESSION:  1. Lower abdominal/suprapubic ventral wall lobulated soft  tissue  mass, arising within the lower rectus musculature more so on the  right. 5.5 x 7.9 x 9.7 cm. Favor sarcoma or other connective tissue  tumor. This should be amenable to percutaneous biopsy. In a younger  female patient, endometriosis within a Cesarean section scar would  also be a consideration for this appearance.  2. Narrow fat plane between the mass in #1 and underlying small  bowel and bladder. No lymphadenopathy. No ascites.  3. Surgically absent gallbladder, uterus, and adnexa. Diverticulosis  of the colon.  Study discussed by telephone with PA BENJAMIN MANN on 07/19/2013 at  11:35 .  Electronically Signed  By: Lars Pinks M.D.  On: 07/19/2013 11:40  Review of Systems     Objective:    Physical Exam  Constitutional: She is oriented to person, place, and time.  Cardiovascular: Normal rate.  Pulmonary/Chest: Effort normal.  Abdominal: Soft.  Mass to right of midline infraumbilical, NTTP, no hernias appreciated  Neurological: She is alert and oriented to person, place, and time.  Psychiatric: She has a normal mood and affect.       Assessment:      Metastatic adenocarcinoma     Plan:      Anticipated size of defect at present size would preclude primary closure. Ideally enough skin could be preserved to allow closure over mesh/ADM for abdominal wall reconstruction. Could do separation of component but as anticipate R Rectus muscle to be a part of resection and possibly some of left rectus, will still have large fascial defect.  If there is not sufficient skin that can be preserved, may need flap closure. In this area, thigh based flap would be possible. Will need to reassess post neoadjuvant chemotherapy. May require referral to tertiary center for reconstruction.  Last resort would to leave abdomen open post resection or planned hernia with absorbable mesh/skin grafting. This would certainly affect her ADLs, require more surgery.   They had questions regarding PET  scan or if tumor will be cleared. Encouraged them to discuss post treatment PET with Dr. Benay Spice.

## 2013-11-05 ENCOUNTER — Encounter (HOSPITAL_COMMUNITY): Payer: Self-pay | Admitting: *Deleted

## 2013-11-05 ENCOUNTER — Encounter (HOSPITAL_COMMUNITY)
Admission: RE | Disposition: A | Discharge status: Home / Self Care | Payer: Self-pay | Source: Ambulatory Visit | Attending: Gynecologic Oncology

## 2013-11-05 ENCOUNTER — Inpatient Hospital Stay (HOSPITAL_COMMUNITY)
Admission: RE | Admit: 2013-11-05 | Discharge: 2013-11-07 | DRG: 983 | Disposition: A | Discharge status: Home / Self Care | Payer: Medicare Other | Source: Ambulatory Visit | Attending: Gynecologic Oncology | Admitting: Gynecologic Oncology

## 2013-11-05 ENCOUNTER — Inpatient Hospital Stay (HOSPITAL_COMMUNITY): Payer: Medicare Other | Admitting: Anesthesiology

## 2013-11-05 ENCOUNTER — Encounter (HOSPITAL_COMMUNITY): Payer: Medicare Other | Admitting: Anesthesiology

## 2013-11-05 DIAGNOSIS — C494 Malignant neoplasm of connective and soft tissue of abdomen: Secondary | ICD-10-CM | POA: Diagnosis not present

## 2013-11-05 DIAGNOSIS — Z803 Family history of malignant neoplasm of breast: Secondary | ICD-10-CM | POA: Diagnosis not present

## 2013-11-05 DIAGNOSIS — Z808 Family history of malignant neoplasm of other organs or systems: Secondary | ICD-10-CM | POA: Diagnosis not present

## 2013-11-05 DIAGNOSIS — Z9089 Acquired absence of other organs: Secondary | ICD-10-CM

## 2013-11-05 DIAGNOSIS — E039 Hypothyroidism, unspecified: Secondary | ICD-10-CM | POA: Diagnosis present

## 2013-11-05 DIAGNOSIS — C569 Malignant neoplasm of unspecified ovary: Principal | ICD-10-CM | POA: Diagnosis present

## 2013-11-05 DIAGNOSIS — C50919 Malignant neoplasm of unspecified site of unspecified female breast: Secondary | ICD-10-CM | POA: Diagnosis not present

## 2013-11-05 DIAGNOSIS — C801 Malignant (primary) neoplasm, unspecified: Secondary | ICD-10-CM | POA: Diagnosis present

## 2013-11-05 DIAGNOSIS — R1900 Intra-abdominal and pelvic swelling, mass and lump, unspecified site: Secondary | ICD-10-CM | POA: Diagnosis not present

## 2013-11-05 DIAGNOSIS — Z8249 Family history of ischemic heart disease and other diseases of the circulatory system: Secondary | ICD-10-CM | POA: Diagnosis not present

## 2013-11-05 DIAGNOSIS — K219 Gastro-esophageal reflux disease without esophagitis: Secondary | ICD-10-CM | POA: Diagnosis present

## 2013-11-05 HISTORY — PX: APPLICATION OF A-CELL OF CHEST/ABDOMEN: SHX6302

## 2013-11-05 HISTORY — PX: LAPAROTOMY: SHX154

## 2013-11-05 LAB — CBC
HCT: 33.2 % — ABNORMAL LOW (ref 36.0–46.0)
HEMOGLOBIN: 11.3 g/dL — AB (ref 12.0–15.0)
MCH: 32.9 pg (ref 26.0–34.0)
MCHC: 34 g/dL (ref 30.0–36.0)
MCV: 96.8 fL (ref 78.0–100.0)
Platelets: 216 10*3/uL (ref 150–400)
RBC: 3.43 MIL/uL — ABNORMAL LOW (ref 3.87–5.11)
RDW: 14.1 % (ref 11.5–15.5)
WBC: 11 10*3/uL — ABNORMAL HIGH (ref 4.0–10.5)

## 2013-11-05 LAB — CREATININE, SERUM
CREATININE: 0.71 mg/dL (ref 0.50–1.10)
GFR calc Af Amer: >90 mL/min (ref >90)
GFR calc non Af Amer: 87 mL/min — ABNORMAL LOW (ref >90)

## 2013-11-05 LAB — TYPE AND SCREEN
ABO/RH(D): A NEG
Antibody Screen: NEGATIVE

## 2013-11-05 SURGERY — LAPAROTOMY, EXPLORATORY
Anesthesia: General

## 2013-11-05 MED ORDER — SODIUM CHLORIDE 0.9 % IJ SOLN
INTRAMUSCULAR | Status: AC
  Filled 2013-11-05: qty 10

## 2013-11-05 MED ORDER — MIDAZOLAM HCL 2 MG/2ML IJ SOLN
INTRAMUSCULAR | Status: AC
  Filled 2013-11-05: qty 2

## 2013-11-05 MED ORDER — LACTATED RINGERS IV SOLN: INTRAVENOUS | Status: DC

## 2013-11-05 MED ORDER — KCL IN DEXTROSE-NACL 20-5-0.45 MEQ/L-%-% IV SOLN
INTRAVENOUS | Status: DC
  Administered 2013-11-05 – 2013-11-06 (×2): via INTRAVENOUS
  Filled 2013-11-05 (×2): qty 1000

## 2013-11-05 MED ORDER — ONDANSETRON HCL 4 MG/2ML IJ SOLN
INTRAMUSCULAR | Status: DC | PRN
  Administered 2013-11-05: 4 mg via INTRAVENOUS

## 2013-11-05 MED ORDER — LIDOCAINE HCL (PF) 2 % IJ SOLN
INTRAMUSCULAR | Status: DC | PRN
  Administered 2013-11-05: 40 mg via INTRADERMAL

## 2013-11-05 MED ORDER — LACTATED RINGERS IV SOLN
INTRAVENOUS | Status: DC | PRN
  Administered 2013-11-05 (×2): via INTRAVENOUS

## 2013-11-05 MED ORDER — EPHEDRINE SULFATE 50 MG/ML IJ SOLN
INTRAMUSCULAR | Status: AC
  Filled 2013-11-05: qty 1

## 2013-11-05 MED ORDER — PROMETHAZINE HCL 25 MG/ML IJ SOLN: 6.2500 mg | INTRAMUSCULAR | Status: DC | PRN

## 2013-11-05 MED ORDER — DEXAMETHASONE SODIUM PHOSPHATE 10 MG/ML IJ SOLN
INTRAMUSCULAR | Status: DC | PRN
  Administered 2013-11-05: 10 mg via INTRAVENOUS

## 2013-11-05 MED ORDER — SODIUM CHLORIDE 0.9 % IJ SOLN
INTRAMUSCULAR | Status: AC
  Filled 2013-11-05: qty 20

## 2013-11-05 MED ORDER — HYDROMORPHONE HCL PF 2 MG/ML IJ SOLN
INTRAMUSCULAR | Status: AC
Start: 2013-11-05 — End: 2013-11-05
  Filled 2013-11-05: qty 1

## 2013-11-05 MED ORDER — ROCURONIUM BROMIDE 100 MG/10ML IV SOLN
INTRAVENOUS | Status: AC
  Filled 2013-11-05: qty 1

## 2013-11-05 MED ORDER — FENTANYL CITRATE 0.05 MG/ML IJ SOLN
INTRAMUSCULAR | Status: AC
  Filled 2013-11-05: qty 5

## 2013-11-05 MED ORDER — GLYCOPYRROLATE 0.2 MG/ML IJ SOLN
INTRAMUSCULAR | Status: AC
  Filled 2013-11-05: qty 2

## 2013-11-05 MED ORDER — CEFAZOLIN SODIUM 1-5 GM-% IV SOLN
1.0000 g | Freq: Four times a day (QID) | INTRAVENOUS | Status: AC
  Administered 2013-11-05 – 2013-11-06 (×3): 1 g via INTRAVENOUS
  Filled 2013-11-05 (×3): qty 50

## 2013-11-05 MED ORDER — ROCURONIUM BROMIDE 100 MG/10ML IV SOLN
INTRAVENOUS | Status: DC | PRN
  Administered 2013-11-05: 30 mg via INTRAVENOUS

## 2013-11-05 MED ORDER — ONDANSETRON HCL 4 MG/2ML IJ SOLN
INTRAMUSCULAR | Status: AC
  Filled 2013-11-05: qty 2

## 2013-11-05 MED ORDER — HYDROMORPHONE HCL PF 1 MG/ML IJ SOLN
0.5000 mg | INTRAMUSCULAR | Status: DC | PRN
  Administered 2013-11-05: 1 mg via INTRAVENOUS
  Filled 2013-11-05: qty 1

## 2013-11-05 MED ORDER — ENOXAPARIN SODIUM 40 MG/0.4ML ~~LOC~~ SOLN
40.0000 mg | SUBCUTANEOUS | Status: AC
  Administered 2013-11-05: 40 mg via SUBCUTANEOUS
  Filled 2013-11-05: qty 0.4

## 2013-11-05 MED ORDER — CEFAZOLIN SODIUM-DEXTROSE 2-3 GM-% IV SOLR
2.0000 g | INTRAVENOUS | Status: AC
  Administered 2013-11-05: 2 g via INTRAVENOUS

## 2013-11-05 MED ORDER — CEFAZOLIN SODIUM-DEXTROSE 2-3 GM-% IV SOLR
INTRAVENOUS | Status: AC
  Filled 2013-11-05: qty 50

## 2013-11-05 MED ORDER — ENOXAPARIN SODIUM 40 MG/0.4ML ~~LOC~~ SOLN
40.0000 mg | SUBCUTANEOUS | Status: DC
  Administered 2013-11-06 – 2013-11-07 (×2): 40 mg via SUBCUTANEOUS
  Filled 2013-11-05 (×4): qty 0.4

## 2013-11-05 MED ORDER — HYDROCODONE-ACETAMINOPHEN 5-325 MG PO TABS
1.0000 | ORAL_TABLET | ORAL | Status: DC | PRN
  Administered 2013-11-06 (×2): 1 via ORAL
  Filled 2013-11-05 (×2): qty 1

## 2013-11-05 MED ORDER — ONDANSETRON HCL 4 MG PO TABS: 4.0000 mg | ORAL_TABLET | Freq: Four times a day (QID) | ORAL | Status: DC | PRN

## 2013-11-05 MED ORDER — EPHEDRINE SULFATE 50 MG/ML IJ SOLN
INTRAMUSCULAR | Status: DC | PRN
  Administered 2013-11-05: 5 mg via INTRAVENOUS

## 2013-11-05 MED ORDER — DEXAMETHASONE SODIUM PHOSPHATE 10 MG/ML IJ SOLN
INTRAMUSCULAR | Status: AC
  Filled 2013-11-05: qty 1

## 2013-11-05 MED ORDER — FENTANYL CITRATE 0.05 MG/ML IJ SOLN
INTRAMUSCULAR | Status: DC | PRN
  Administered 2013-11-05: 100 ug via INTRAVENOUS
  Administered 2013-11-05: 50 ug via INTRAVENOUS

## 2013-11-05 MED ORDER — PROPOFOL 10 MG/ML IV BOLUS
INTRAVENOUS | Status: AC
  Filled 2013-11-05: qty 20

## 2013-11-05 MED ORDER — BUPIVACAINE LIPOSOME 1.3 % IJ SUSP
20.0000 mL | Freq: Once | INTRAMUSCULAR | Status: AC
  Administered 2013-11-05: 20 mL
  Filled 2013-11-05: qty 20

## 2013-11-05 MED ORDER — ONDANSETRON HCL 4 MG/2ML IJ SOLN
4.0000 mg | Freq: Four times a day (QID) | INTRAMUSCULAR | Status: DC | PRN
  Administered 2013-11-05: 4 mg via INTRAVENOUS

## 2013-11-05 MED ORDER — SUCCINYLCHOLINE CHLORIDE 20 MG/ML IJ SOLN
INTRAMUSCULAR | Status: DC | PRN
  Administered 2013-11-05: 100 mg via INTRAVENOUS

## 2013-11-05 MED ORDER — LIDOCAINE HCL (CARDIAC) 20 MG/ML IV SOLN
INTRAVENOUS | Status: AC
  Filled 2013-11-05: qty 5

## 2013-11-05 MED ORDER — NEOSTIGMINE METHYLSULFATE 10 MG/10ML IV SOLN
INTRAVENOUS | Status: DC | PRN
  Administered 2013-11-05: 3 mg via INTRAVENOUS

## 2013-11-05 MED ORDER — GLYCOPYRROLATE 0.2 MG/ML IJ SOLN
INTRAMUSCULAR | Status: DC | PRN
  Administered 2013-11-05: 0.4 mg via INTRAVENOUS

## 2013-11-05 MED ORDER — LEVOTHYROXINE SODIUM 75 MCG PO TABS
75.0000 ug | ORAL_TABLET | Freq: Every day | ORAL | Status: DC
  Administered 2013-11-06 – 2013-11-07 (×2): 75 ug via ORAL
  Filled 2013-11-05 (×6): qty 1

## 2013-11-05 MED ORDER — 0.9 % SODIUM CHLORIDE (POUR BTL) OPTIME
TOPICAL | Status: DC | PRN
  Administered 2013-11-05: 1000 mL

## 2013-11-05 MED ORDER — PROPOFOL 10 MG/ML IV BOLUS
INTRAVENOUS | Status: DC | PRN
  Administered 2013-11-05: 130 mg via INTRAVENOUS

## 2013-11-05 MED ORDER — HYDROMORPHONE HCL PF 1 MG/ML IJ SOLN: 0.2500 mg | INTRAMUSCULAR | Status: DC | PRN

## 2013-11-05 MED ORDER — MIDAZOLAM HCL 5 MG/5ML IJ SOLN
INTRAMUSCULAR | Status: DC | PRN
  Administered 2013-11-05: 2 mg via INTRAVENOUS

## 2013-11-05 MED ORDER — HYDROMORPHONE HCL PF 1 MG/ML IJ SOLN
INTRAMUSCULAR | Status: DC | PRN
  Administered 2013-11-05: 1 mg via INTRAVENOUS

## 2013-11-05 MED ORDER — PROMETHAZINE HCL 25 MG/ML IJ SOLN
12.5000 mg | Freq: Once | INTRAMUSCULAR | Status: AC
  Administered 2013-11-05: 12.5 mg via INTRAVENOUS
  Filled 2013-11-05: qty 1

## 2013-11-05 SURGICAL SUPPLY — 47 items
ATTRACTOMAT 16X20 MAGNETIC DRP (DRAPES) ×3 IMPLANT
BAG URINE DRAINAGE (UROLOGICAL SUPPLIES) ×3 IMPLANT
BLADE EXTENDED COATED 6.5IN (ELECTRODE) ×3 IMPLANT
CANISTER SUCTION 2500CC (MISCELLANEOUS) ×3 IMPLANT
CATH FOLEY 2WAY SLVR  5CC 16FR (CATHETERS) ×2
CATH FOLEY 2WAY SLVR 5CC 16FR (CATHETERS) ×1 IMPLANT
CLIP TI MEDIUM LARGE 6 (CLIP) ×6 IMPLANT
CONT SPEC 4OZ CLIKSEAL STRL BL (MISCELLANEOUS) ×3 IMPLANT
DRAIN CHANNEL RND F F (WOUND CARE) ×2 IMPLANT
DRAPE TABLE BACK 44X90 PK DISP (DRAPES) ×3 IMPLANT
DRAPE UTILITY XL STRL (DRAPES) ×2 IMPLANT
DRAPE WARM FLUID 44X44 (DRAPE) ×3 IMPLANT
DRSG TELFA 3X8 NADH (GAUZE/BANDAGES/DRESSINGS) ×3 IMPLANT
ELECT REM PT RETURN 9FT ADLT (ELECTROSURGICAL) ×3
ELECTRODE REM PT RTRN 9FT ADLT (ELECTROSURGICAL) ×1 IMPLANT
EVACUATOR SILICONE 100CC (DRAIN) ×2 IMPLANT
GAUZE SPONGE 4X4 12PLY STRL (GAUZE/BANDAGES/DRESSINGS) ×3 IMPLANT
GAUZE SPONGE 4X4 16PLY XRAY LF (GAUZE/BANDAGES/DRESSINGS) IMPLANT
GLOVE BIO SURGEON STRL SZ 6.5 (GLOVE) ×2 IMPLANT
GLOVE BIO SURGEON STRL SZ7.5 (GLOVE) ×6 IMPLANT
GLOVE BIO SURGEONS STRL SZ 6.5 (GLOVE) ×1
GLOVE BIOGEL PI IND STRL 7.0 (GLOVE) ×1 IMPLANT
GLOVE BIOGEL PI INDICATOR 7.0 (GLOVE) ×2
HOLDER FOLEY CATH W/STRAP (MISCELLANEOUS) ×3 IMPLANT
KIT BASIN OR (CUSTOM PROCEDURE TRAY) ×3 IMPLANT
NDL HYPO 25X1 1.5 SAFETY (NEEDLE) ×1 IMPLANT
NEEDLE HYPO 25X1 1.5 SAFETY (NEEDLE) ×3 IMPLANT
NS IRRIG 1000ML POUR BTL (IV SOLUTION) ×4 IMPLANT
PACK GENERAL/GYN (CUSTOM PROCEDURE TRAY) ×3 IMPLANT
PAD DRESSING TELFA 3X8 NADH (GAUZE/BANDAGES/DRESSINGS) IMPLANT
SHEET LAVH (DRAPES) ×3 IMPLANT
SPONGE LAP 18X18 X RAY DECT (DISPOSABLE) ×6 IMPLANT
SUT ETHILON 2 0 PS N (SUTURE) ×2 IMPLANT
SUT ETHILON 3 0 PS 1 (SUTURE) ×2 IMPLANT
SUT PDS AB 0 CT1 36 (SUTURE) ×4 IMPLANT
SUT PDS AB 1 CTXB1 36 (SUTURE) ×2 IMPLANT
SUT PDS AB 2-0 CT2 27 (SUTURE) ×4 IMPLANT
SUT VIC AB 0 CT1 36 (SUTURE) ×10 IMPLANT
SUT VIC AB 2-0 CT2 27 (SUTURE) ×4 IMPLANT
SUT VIC AB 3-0 PS2 18 (SUTURE) ×6
SUT VIC AB 3-0 PS2 18XBRD (SUTURE) IMPLANT
SUT VICRYL 2 0 18  UND BR (SUTURE) ×2
SUT VICRYL 2 0 18 UND BR (SUTURE) ×1 IMPLANT
SYR CONTROL 10ML LL (SYRINGE) ×3 IMPLANT
TISSUE MATRIX STRATTICE 10X16 (Tissue) ×3 IMPLANT
TISSUE MATRIX STRATTICE 16X10 (Tissue) IMPLANT
TOWEL OR 17X26 10 PK STRL BLUE (TOWEL DISPOSABLE) ×3 IMPLANT

## 2013-11-05 NOTE — Interval H&P Note (Signed)
History and Physical Interval Note:  11/05/2013 6:50 AM  [name]  has presented today for surgery, with the diagnosis of METASTATIC ADENOCARCINOMA  The various methods of treatment have been discussed with the patient and family. After consideration of risks, benefits and other options for treatment, the patient has consented to  Possible abdominal wall resection with Strattice as a surgical intervention .  The patient's history has been reviewed, patient examined, no change in status, stable for surgery.  I have reviewed the patient's chart and labs.  Questions were answered to the patient's satisfaction.    Patient seen and examined- Clear to ausculatation Heart regular No significant change abdominal wall mass, remainder abdomen soft.  Plan repair fascial defect if needed with Strattice.    THIMMAPPA, BRINDA

## 2013-11-05 NOTE — Progress Notes (Signed)
Dr. Winfred Leeds made aware of patient's EKG readings.

## 2013-11-05 NOTE — H&P (View-Only) (Signed)
Consult Note: Gyn-Onc  [name] 68 y.o. female  CC:  Chief Complaint  Patient presents with  . Abdominal mass    Follow up    HPI: Patient is seen today in consultation at the request of Dr. Barry Dienes.   Patient 68 year old gravida 0 who in 1988 underwent a total dominant hysterectomy bilateral salpingo-oophorectomy by Dr. Ree Edman. 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 Her washings were negative. CA 125 at the time of diagnosis was 33. She's been doing well without significant complaints since that time. She's had excellent routine medical care. For the past several months she felt that her stomach "old age" in the shower and felt that she was gaining weight. She scratched her abdomen felt that she felt a mass and had some discomfort and she went to see her primary care physician Dr. Gerarda Fraction. A CT scan of the chest abdomen and pelvis was ordered on 08/06/2013 but revealed:  FINDINGS:  Negative lung bases. No pericardial or pleural effusion. No acute osseous abnormality identified. Advanced L4-L5 disc degeneration. Lobulated soft tissue mass arising just to the right of midline in the lower abdomen and suprapubic region, inseparable from both caudal rectus muscles, more so the right. Approaching the mass the right rectus muscle is expanded (series 2, image 57. The mass is multilobulated and encompasses 55 x 79 x 97 cm.There is a narrow fat plane between the mass and underlying small bowel and bladder within the pelvis. No inguinal or pelvic sidewall lymphadenopathy. No pelvic free fluid. Uterus and adnexa not identified and appear to be surgically absent. Negative distal colon except for retained stool and diverticulosis. Left colon, transverse colon, right colon within normal limits. Appendix not identified. Oral contrast has almost reached the terminal ileum. No dilated small bowel. Fairly  decompressed stomach. Negative duodenum. Surgically absent gallbladder. Mild intra and extrahepatic biliary ductal enlargement likely postoperative. Normal liver enhancement. Spleen, pancreas, and adrenal glands are normal. Portal venous system within normal limits. Major arterial structures in the abdomen and pelvis are patent. Aortoiliac calcified atherosclerosis noted. No abdominal free fluid. No lymphadenopathy. Kidneys within normal limits; small left upper pole low-density area with simple fluid densitometry.  IMPRESSION:  1. Lower abdominal/suprapubic ventral wall lobulated soft tissue mass, arising within the lower rectus musculature more so on the right. 5.5 x 7.9 x 9.7 cm. Favor sarcoma or other connective tissue tumor. This should be amenable to percutaneous biopsy. In a younger female patient, endometriosis within a Cesarean section scar would also be a consideration for this appearance.  2. Narrow fat plane between the mass in #1 and underlying small bowel and bladder. No lymphadenopathy. No ascites.  3. Surgically absent gallbladder, uterus, and adnexa. Diverticulosis of the colon.  She underwent a core biopsy of the lesion on March 17 that revealed: Diagnosis Soft Tissue Needle Core Biopsy, abdominal wall mass - METASTATIC ADENOCARCINOMA INVOLVING SOFT TISSUE, SEE COMMENT. Microscopic Comment Needle core biopsies demonstrate diffuse involvement by well differentiated adenocarcinoma. The adenocarcinoma has the following immunophenotype: Cytokeratin 7 - strong diffuse expression Cytokeratin 20 - negative expression CDX2 - negative expression TTF-1 - negative expression Estrogen receptor - negative expression Vimentin - patchy mild to moderate expression The clinical history of hysterectomy for "possible endometrial cancer" is noted. The morphology and immunophenotype of the current biopsies is consistent with metastatic adenocarcinoma. Given the clinical history, metastasis from a  gynecological primary is a consideration.  However, metastasis from a primary lung, upper gastrointestinal tract or pancreatic-biliary tumor is not definitively excluded.  With a biopsy was not definitive, given her past history as well no other primary site was felt that this very well could be a recurrence of her primary ovarian tumor. Use in the area of her prior transverse skin incision. There is a discussion with Dr. Barry Dienes, Dr. Benay Spice, plastic surgery, and myself it was felt that it would be a fairly radical dissection to proceed with surgery at this time and the decision was made to approach her with neoadjuvant chemotherapy. Her CA 125 was elevated at the time of cycle #1 of chemotherapy at 128. She's another cycle of chemotherapy scheduled for April 24. She's overall tolerating her chemotherapy quite well. She had diarrhea for one day presented by his been feeling well. She is up-to-date on her mammograms. Her colonoscopy 2-3 years ago and was recommended she'll follow up in 5 years. Family history significant for paternal grandmother with breast cancer in her 40s. She has 2 sisters who have had melanoma felt to be due to sun exposure. The patient herself does see a dermatologist.  She's completed her third cycle of paclitaxel and carboplatin on Sep 28 2011. Her CA 125 at that time was 147 which is not significantly changed from her pre-chemotherapy value of 128. She had a CT scan that revealed: FINDINGS:  Lung bases show no acute findings. Heart size normal. No pericardial or pleural effusion. Liver is unremarkable. Cholecystectomy with stable intrahepatic and extrahepatic biliary duct prominence. Adrenal glands and right kidney are unremarkable. Low-attenuation lesions in the left kidney measure up to 1.4 cm, as before and are likely cysts. Kidneys, spleen, pancreas, stomach and bowel are unremarkable. A heterogeneous mass centered in the inferior right rectus abdominus muscle measures 5.3 x 7.5 cm  (previously 5.5 x 7.9 cm). No pathologically enlarged lymph nodes. Scattered atherosclerotic calcification of the arterial vasculature without abdominal aortic aneurysm. No free fluid. No worrisome lytic or sclerotic lesions.  Degenerative changes are seen in the spine.  IMPRESSION:  Right rectus abdominus mass is stable to very minimally smaller than on 07/19/2013.  After discussion and consideration she scheduled for a debulking surgery for removal of this mass as it appears to be her primary site of disease on June 23 with Dr. Barry Dienes as well as Dr. Iran Planas from plastic surgery. She comes in today for preoperative discussion.  She tolerated her chemotherapy very well. With the first cycle she might have a little bit of neuropathy in her fingers but has had none since then. She had no nausea or vomiting. Just slight fatigue.   Review of Systems  Constitutional: No weight changes. Denies fever. Skin: No rash, sores, jaundice, itching, or dryness.  Cardiovascular: No chest pain, shortness of breath, or edema  Pulmonary: No cough or wheeze.  Gastro Intestinal: Reporting intermittent lower abdominal soreness.  No nausea, vomiting, constipation, or diarrhea reported. No bright red blood per rectum or change in bowel movement.  Genitourinary: No frequency, urgency, or dysuria.  Denies vaginal bleeding and discharge.  Musculoskeletal: No myalgia, arthralgia, joint swelling or pain.  Neurologic: No weakness, numbness, or change in gait.  Psychology: No changes   Current Meds:  Outpatient Encounter Prescriptions as of 10/31/2013  Medication Sig  . Calcium Carb-Cholecalciferol (CALCIUM 600 + D PO) Take 1 tablet by mouth daily.  . Cholecalciferol (EQL VITAMIN D3) 1000 UNITS tablet Take 1,000 Units by mouth daily.  Marland Kitchen estrogens, conjugated, (PREMARIN) 0.625  MG tablet Take 0.625 mg by mouth every Monday, Wednesday, and Friday.   Marland Kitchen ibuprofen (ADVIL,MOTRIN) 200 MG tablet Take 200 mg by mouth every 4  (four) hours as needed for fever.   . levothyroxine (SYNTHROID, LEVOTHROID) 75 MCG tablet Take 75 mcg by mouth every morning.   . Multiple Vitamins-Minerals (MULTIVITAMIN WITH MINERALS) tablet Take 1 tablet by mouth daily.   . Omega-3 Fatty Acids (FISH OIL) 1200 MG CAPS Take 1,200 mg by mouth daily.     Allergy: No Known Allergies  Social Hx:   History   Social History  . Marital Status: Single    Spouse Name: N/A    Number of Children: N/A  . Years of Education: N/A   Occupational History  . Not on file.   Social History Main Topics  . Smoking status: Never Smoker   . Smokeless tobacco: Not on file  . Alcohol Use: No  . Drug Use: No  . Sexual Activity: No     Comment: HYST   Other Topics Concern  . 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 Tonsillectomy    . Cholecystectomy    . Oophorectomy      BSO  . Total abdominal hysterectomy  1988    ovarian cyst  BSO  . Tonsillectomy      Past Medical Hx:  Past Medical History  Diagnosis Date  . Ovarian cyst   . Thyroid disease     Oncology Hx:    Adenocarcinoma of abdominal wall, unclear primary   08/09/2013 Initial Diagnosis Adenocarcinoma of abdominal wall, unclear primary   08/16/2013 -  Chemotherapy paclitaxel and carboplatin    Family Hx:  Family History  Problem Relation Age of Onset  . Heart disease Mother   . Heart disease Sister   . Diabetes Sister   . Cancer Sister     melinoma-skin cancer  . Breast cancer Paternal Grandmother     Age 89's  . Cancer Paternal Grandmother     breast  . Cancer Paternal Grandfather     prostate/bladder    Vitals:  Blood pressure 132/61, pulse 72, temperature 98.4 F (36.9 C), temperature source Oral,  resp. rate 18, height 5\' 7"  (1.702 m), weight 132 lb 9.6 oz (60.147 kg).  Physical Exam: Well-nourished well-developed female who appears younger than stated age in no acute distress.   Neck: Supple, no lymphadenopathy no thyromegaly.  Lungs: Clear to auscultation bilaterally.  Cardiac: Regular rate and rhythm.  Abdomen: Well-healed transverse Pfannenstiel skin incision. Just inferior it was a right-sided within the transverse skin incision there is a 10 x 4 cm mass palpable. There is no fluid wave is no other palpable masses. There is no hepatomegaly.  Assessment/Plan: 68 year old with a history of adenocarcinoma within the left ovary found incidentally at the time of a total abdominal hysterectomy bilateral salpingo-oophorectomy. This was in 1988. She did not require any additional treatment. I do not have a final pathology report. This was an incidental note made in her discharge summary. We are treating this as an ovarian cancer recurrence with a neoadjuvant approach.  The mass has not had a significant improvement with regards to size. Therefore we  believe that surgical resection would be of benefit for her. She is scheduled for surgery with Dr. Barry Dienes myself and Dr. Iran Planas on June 23.  Risks of surgery including but not limited to injury to surrounding organs, hernia, wound infection both superficial and deep. injury to surrounding organs and thromboembolic disease were discussed with the patient. Her questions were elicited in answered to her satisfaction. I believe we can use her transverse skin incision for the procedure that we'll be able to close the skin over a period we discussed the possibility of needing to have a mesh placed. I also discussed with her that she may need to have a drain in postoperatively. Her sister was a further discussion and did not have any other questions.  Ashira Kirsten A., MD 10/31/2013, 12:12 PM

## 2013-11-05 NOTE — Transfer of Care (Signed)
Immediate Anesthesia Transfer of Care Note  Patient: [name]  Procedure(s) Performed: Procedure(s) (LRB): RESECTION OF PELVIC MASS (N/A) ABDOMINAL WALL RESCONTRUCTION WITH STRATUS (N/A)  Patient Location: PACU  Anesthesia Type: General  Level of Consciousness: sedated, patient cooperative and responds to stimulation  Airway & Oxygen Therapy: Patient Spontanous Breathing and Patient connected to face mask oxgen  Post-op Assessment: Report given to PACU RN and Post -op Vital signs reviewed and stable  Post vital signs: Reviewed and stable  Complications: No apparent anesthesia complications

## 2013-11-05 NOTE — Progress Notes (Signed)
Dr. Winfred Leeds in to see patient.

## 2013-11-05 NOTE — Interval H&P Note (Signed)
History and Physical Interval Note:  11/05/2013 7:06 AM  [name]  has presented today for surgery, with the diagnosis of METASTATIC ADENOCARCINOMA  The various methods of treatment have been discussed with the patient and family. After consideration of risks, benefits and other options for treatment, the patient has consented to  Procedure(s): RESECTION OF PELVIC MASS (N/A) ABDOMINAL WALL RESCONTRUCTION WITH STRATUS (N/A) as a surgical intervention .  The patient's history has been reviewed, patient examined, no change in status, stable for surgery.  I have reviewed the patient's chart and labs.  Questions were answered to the patient's satisfaction.     [place] A.

## 2013-11-05 NOTE — Anesthesia Postprocedure Evaluation (Signed)
Anesthesia Post Note  Patient: [name]  Procedure(s) Performed: Procedure(s) (LRB): RESECTION OF PELVIC MASS (N/A) ABDOMINAL WALL RESCONTRUCTION WITH STRATUS (N/A)  Anesthesia type: General  Patient location: PACU  Post pain: Pain level controlled  Post assessment: Post-op Vital signs reviewed  Last Vitals:  Filed Vitals:   11/05/13 1246  BP: 132/59  Pulse: 86  Temp: 36.6 C  Resp: 16    Post vital signs: Reviewed  Level of consciousness: sedated  Complications: No apparent anesthesia complications

## 2013-11-05 NOTE — Op Note (Signed)
PATIENT: [name] DATE OF BIRTH: 07/08/1945 ENCOUNTER DATE: 11/05/2013   Preop Diagnosis: Recurrent ovarian cancer  Postoperative Diagnosis: Same  Surgery: Resection abdominal wall mass  Surgeons:  Imagene Gurney A. Alycia Rossetti, MD; Everitt Amber, MD   Anesthesia: General   Anesthesiologist: Dr. Winfred Leeds  Estimated blood loss: 25 ml  IVF: 1000 ml   Urine output: 409 ml   Complications: None   Pathology: Abdominal wall mass with fascia and right rectus.`  Operative findings: 8 involving the subcutaneous tissues, fascia, and rectus on the right side. No obvious intraperitoneal disease. Normal-appearing omentum  Procedure: The patient was identified in the preoperative holding area. Informed consent was signed on the chart. Patient was seen history was reviewed and exam was performed.   The patient was then taken to the operating room and placed in the supine position with SCD hose on. General anesthesia was then induced without difficulty. She was then placed in the dorsolithotomy position. The abdomen was prepped with chlor prep sponges per protocol. Perineum was prepped with Betadine. The vagina was prepped with Betadine a Foley catheter was inserted into the bladder under sterile conditions.  The patient was then draped after the prep was dried. Timeout was performed the patient, procedure, antibiotic, allergy, and length of procedure. Using the patient's prior transverse skin incision. An incision was made which measured approximately 10 cm. The subcutaneous tissue was dissected off the underlying mass. The subcutaneous tissues in a circumferential fashion with dissected off the fascia around the tumor with bovie cautery. The fascia was scored in a circumferential fashion to negative margins visually. The mass was elevated. This required resection of a portion of the right rectus bellies and probable inferior epigastric. However, the inferior gastric appear to be somewhat tethered medially  and was no in its normal location. We continued circumferentially around the mass involving the right rectus and a portion of the left rectus bellies. This was taken down using Bovie cautery. The peritoneum was tented and entered. The mass was dissected off the peritoneum. The peritoneum itself does not appear to be visually involved with tumor. The bladder was not near the field of resection. Abdominal exploration revealed no evidence of peritoneal otherwise metastatic disease. The omentum was laid in the lower pelvis. The peritoneum was closed with a 2 over a running fashion.  The defect area was irrigated with water. It was noted to be hemostatic. There was no bleeding of the rectus bellies. At this point, Dr. Barry Dienes and Dr. Iran Planas from plastic surgery entered the room. The remainder of the dictation procedure will be completed by them under separate dictation. At the point where Dr. Harrington Challenger and I left, all instrument, suture, laparotomy, Ray-Tec, and needle counts were correct x2. The patient tolerated the procedure well and was taken recovery room in stable condition.   This is [name] dictating an operative note on [name].

## 2013-11-05 NOTE — Op Note (Signed)
Operative Note   DATE OF OPERATION: 6.23.15  LOCATION: [facility]- inpatient  SURGICAL DIVISION: Plastic Surgery  PREOPERATIVE DIAGNOSES:  1. Abdominal wall mass 2. Recurrent ovarian cancer  POSTOPERATIVE DIAGNOSES:  same  PROCEDURE:  Abdominal wall reconstruction with Strattice matrix (10 x 11 cm)  SURGEON: Irene Limbo MD MBA  ASSISTANTMamie Laurel MD  ANESTHESIA:  General.   EBL: 50 ml  COMPLICATIONS: None.   INDICATIONS FOR PROCEDURE:  The patient, [name], is a 68 y.o. female born on 12/22/1945, is here for resection of abdominal wall mass consistent with recurrent ovarian cancer   FINDINGS: Mass involved with right rectus. Following resection, defect of anterior and posterior rectus sheath fascia, loss of right rectus muscle. Peritoneum intact.  DESCRIPTION OF PROCEDURE:  The patient's operative site was marked with the patient in the preoperative area. The patient was taken to the operating room. SCDs were placed and IV antibiotics were given. The patient's operative site was prepped and draped in a sterile fashion. A time out was performed and all information was confirmed to be correct.  Following completion of resection, defect was noted in abdominal wall fascia as described above. No skin defect. Srtattice matrix prepared and trimmed to defect size. Strattice inset to remaining fascia as bridge with 0 and 2-0 PDS sutures. Strattice inset with tension and additional matrix trimmed as tension created. Wound irrigated and 19 fr drain placed over anterior surface repair. Superficial fascia closed with 3-0 vicryl and dermis approximated with 3-0 vicryl running suture. Skin closure completed with staples. Dry dressing applied.   The patient was allowed to wake from anesthesia, extubated and taken to the recovery room in satisfactory condition.   SPECIMENS: none  DRAINS: 54 Fr  Irene Limbo, MD [facility] [phone]

## 2013-11-06 LAB — CBC
HEMATOCRIT: 28.9 % — AB (ref 36.0–46.0)
Hemoglobin: 9.8 g/dL — ABNORMAL LOW (ref 12.0–15.0)
MCH: 33.3 pg (ref 26.0–34.0)
MCHC: 33.9 g/dL (ref 30.0–36.0)
MCV: 98.3 fL (ref 78.0–100.0)
PLATELETS: 198 10*3/uL (ref 150–400)
RBC: 2.94 MIL/uL — ABNORMAL LOW (ref 3.87–5.11)
RDW: 14.5 % (ref 11.5–15.5)
WBC: 8.8 10*3/uL (ref 4.0–10.5)

## 2013-11-06 LAB — BASIC METABOLIC PANEL
BUN: 10 mg/dL (ref 6–23)
CALCIUM: 8.7 mg/dL (ref 8.4–10.5)
CHLORIDE: 101 meq/L (ref 96–112)
CO2: 26 meq/L (ref 19–32)
CREATININE: 0.71 mg/dL (ref 0.50–1.10)
GFR calc Af Amer: >90 mL/min (ref >90)
GFR calc non Af Amer: 87 mL/min — ABNORMAL LOW (ref >90)
Glucose, Bld: 121 mg/dL — ABNORMAL HIGH (ref 70–99)
Potassium: 4.4 mEq/L (ref 3.7–5.3)
Sodium: 138 mEq/L (ref 137–147)

## 2013-11-06 MED ORDER — IBUPROFEN 600 MG PO TABS
600.0000 mg | ORAL_TABLET | Freq: Four times a day (QID) | ORAL | Status: DC | PRN
  Filled 2013-11-06: qty 1

## 2013-11-06 NOTE — Progress Notes (Signed)
POD# 1 resection abdominal wall mass, abdominal wall reconstruction with Strattice  Temp:  [97.4 F (36.3 C)-98.1 F (36.7 C)] 97.8 F (36.6 C) (06/24 0501) Pulse Rate:  [53-90] 60 (06/24 0501) Resp:  [8-19] 16 (06/24 0501) BP: (90-139)/(48-75) 103/67 mmHg (06/24 0501) SpO2:  [95 %-100 %] 98 % (06/24 0501) Weight:  [59.875 kg (132 lb)] 59.875 kg (132 lb) (06/24 0124)  JP 105 PO 120  Stood up, not ambulated yet No pain in bed, took pain meds for restless legs No nausea, tolerating clears  PE Alert, NAD Abdomen soft, distended Dressing dry  A/P Will defer Foley and diet advancement to  [place] Ambulate, OOB today Will remove dressing am 11/07/13 and ok to shower then from plastics standpoint Pt may use abdominal binder if desired , esp for ambulation F/u 1 week post discharge with myself  Irene Limbo, MD [facility] Plastic & Reconstructive Surgery [phone]

## 2013-11-06 NOTE — Progress Notes (Signed)
Patient ID: [name], female   DOB: [birth year], 68 y.o.   MRN: [id number] 1 Day Post-Op Procedure(s) (LRB): RESECTION OF PELVIC MASS (N/A) ABDOMINAL WALL RESCONTRUCTION WITH STRATUS (N/A)  Subjective: Patient reports no complaints  Objective: Vital signs in last 24 hours: Temp:  [97.7 F (36.5 C)-98.1 F (36.7 C)] 97.9 F (36.6 C) (06/24 0936) Pulse Rate:  [53-90] 57 (06/24 0936) Resp:  [14-19] 17 (06/24 0936) BP: (90-134)/(48-67) 107/64 mmHg (06/24 0936) SpO2:  [98 %-100 %] 100 % (06/24 0936) Weight:  [59.875 kg (132 lb)] 59.875 kg (132 lb) (06/24 0124) Last BM Date: 11/05/13  Intake/Output from previous day: 06/23 0701 - 06/24 0700 In: 2761.3 [P.O.:120; I.V.:2591.3; IV Piggyback:50] Out: 1895 [CMKLK:9179; Drains:105; Blood:50]  Physical Examination: General: alert Resp: clear to auscultation bilaterally Cardio: regular rate and rhythm, S1, S2 normal, no murmur, click, rub or gallop GI: soft, non-tender; bowel sounds normal; no masses,  no organomegaly and incision: dressing C/D Extremities: extremities normal, atraumatic, no cyanosis or edema and Homans sign is negative, no sign of DVT  Labs: WBC/Hgb/Hct/Plts:  8.8/9.8/28.9/198 (06/24 0500) BUN/Cr/glu/ALT/AST/amyl/lip:  10/0.71/--/--/--/--/-- (06/24 0500)   Assessment:  68 y.o. s/p Procedure(s): RESECTION OF PELVIC MASS ABDOMINAL WALL RESCONTRUCTION WITH STRATUS: stable Pain:  Pain is well-controlled on oral medications.  GI:  Tolerating po: Yes     FEN: Electrolytes in range  Prophylaxis: pharmacologic prophylaxis (with any of the following: enoxaparin (Lovenox) 40mg  SQ 2 hours prior to surgery then every day).  Plan: Advance diet Encourage ambulation D/C foley catheter   LOS: 1 day    Stein,Danielle Spinner A 11/06/2013, 9:51 AM

## 2013-11-07 LAB — BASIC METABOLIC PANEL
BUN: 9 mg/dL (ref 6–23)
CALCIUM: 8.8 mg/dL (ref 8.4–10.5)
CHLORIDE: 105 meq/L (ref 96–112)
CO2: 28 meq/L (ref 19–32)
Creatinine, Ser: 0.71 mg/dL (ref 0.50–1.10)
GFR calc Af Amer: >90 mL/min (ref >90)
GFR calc non Af Amer: 87 mL/min — ABNORMAL LOW (ref >90)
Glucose, Bld: 95 mg/dL (ref 70–99)
Potassium: 4.1 mEq/L (ref 3.7–5.3)
SODIUM: 142 meq/L (ref 137–147)

## 2013-11-07 LAB — CBC
HCT: 30.5 % — ABNORMAL LOW (ref 36.0–46.0)
Hemoglobin: 10 g/dL — ABNORMAL LOW (ref 12.0–15.0)
MCH: 33 pg (ref 26.0–34.0)
MCHC: 32.8 g/dL (ref 30.0–36.0)
MCV: 100.7 fL — ABNORMAL HIGH (ref 78.0–100.0)
PLATELETS: 208 10*3/uL (ref 150–400)
RBC: 3.03 MIL/uL — AB (ref 3.87–5.11)
RDW: 14.8 % (ref 11.5–15.5)
WBC: 7.3 10*3/uL (ref 4.0–10.5)

## 2013-11-07 MED ORDER — DSS 100 MG PO CAPS
100.0000 mg | ORAL_CAPSULE | Freq: Every day | ORAL | Status: DC
Start: 2013-11-07 — End: 2014-05-01

## 2013-11-07 MED ORDER — IBUPROFEN 600 MG PO TABS: 600.0000 mg | ORAL_TABLET | Freq: Four times a day (QID) | ORAL | Status: DC | PRN

## 2013-11-07 MED ORDER — DOCUSATE SODIUM 100 MG PO CAPS
100.0000 mg | ORAL_CAPSULE | Freq: Every day | ORAL | Status: DC
  Administered 2013-11-07: 100 mg via ORAL
  Filled 2013-11-07: qty 1

## 2013-11-07 MED ORDER — HYDROCODONE-ACETAMINOPHEN 5-325 MG PO TABS: 1.0000 | ORAL_TABLET | ORAL | Status: DC | PRN

## 2013-11-07 MED ORDER — ENOXAPARIN SODIUM 40 MG/0.4ML ~~LOC~~ SOLN: 40.0000 mg | SUBCUTANEOUS | Status: DC

## 2013-11-07 MED ORDER — ENOXAPARIN (LOVENOX) PATIENT EDUCATION KIT
PACK | Freq: Once | Status: AC
  Administered 2013-11-07: 11:00:00
  Filled 2013-11-07: qty 1

## 2013-11-07 NOTE — Progress Notes (Signed)
Patient ID: [name], female   DOB: Jul 17, 1945, 68 y.o.   MRN: [id number] 2 Days Post-Op Procedure(s) (LRB): RESECTION OF PELVIC MASS (N/A) ABDOMINAL WALL RESCONTRUCTION WITH STRATUS (N/A)  Subjective: Patient reports no complaints. Pain well controlled with vicodin. + flatus, no BM as yet. Would like a stool softener.  Objective: Vital signs in last 24 hours: Temp:  [98.1 F (36.7 C)-98.3 F (36.8 C)] 98.1 F (36.7 C) (06/25 0600) Pulse Rate:  [61-86] 61 (06/25 0600) Resp:  [16-18] 16 (06/25 0600) BP: (98-118)/(59-77) 98/59 mmHg (06/25 0600) SpO2:  [90 %-100 %] 99 % (06/25 0600) Last BM Date: 11/05/13  Intake/Output from previous day: 06/24 0701 - 06/25 0700 In: 1200 [P.O.:600; I.V.:600] Out: 2405 [Urine:2380; Drains:25]  Physical Examination: General: alert Resp: clear to auscultation bilaterally Cardio: regular rate and rhythm, S1, S2 normal, no murmur, click, rub or gallop GI: soft, non-tender; bowel sounds normal; no masses,  no organomegaly and incision: dressing C/D Extremities: extremities normal, atraumatic, no cyanosis or edema and Homans sign is negative, no sign of DVT  Labs: WBC/Hgb/Hct/Plts:  7.3/10.0/30.5/208 (06/25 2924) BUN/Cr/glu/ALT/AST/amyl/lip:  9/0.71/--/--/--/--/-- (06/25 4628)   Assessment:  68 y.o. s/p Procedure(s): RESECTION OF PELVIC MASS ABDOMINAL WALL RESCONTRUCTION WITH STRATUS: stable Pain:  Pain is well-controlled on oral medications.  GI:  Tolerating po: Yes     FEN: Electrolytes in range  Prophylaxis: pharmacologic prophylaxis (with any of the following: enoxaparin (Lovenox) 40mg  SQ 2 hours prior to surgery then every day).  Plan: Advance diet Encourage ambulation Stool softener. Plan for discharge to home today with lovenox prophylaxis. Drain to remain in - for drain teaching. Followup with Dr Iran Planas in 1 week for suture removal and drain evalution. Followup with Dr Alycia Rossetti in approximately 1 month.   LOS: 2 days     Donaciano Eva 11/07/2013, 11:11 AM

## 2013-11-07 NOTE — Progress Notes (Signed)
POD# 2 resection abdominal wall mass, abdominal wall reconstruction with Strattice  Temp:  [97.9 F (36.6 C)-98.3 F (36.8 C)] 98.1 F (36.7 C) (06/25 0600) Pulse Rate:  [57-86] 61 (06/25 0600) Resp:  [16-18] 16 (06/25 0600) BP: (98-118)/(59-77) 98/59 mmHg (06/25 0600) SpO2:  [90 %-100 %] 99 % (06/25 0600)  JP 25 PO 600  Able to void post Foley, ambulated in hall, took pain pills once during day, pain mostly when walking  PE Abdomen: dressing removed incision intact, scant drainage JP serosang Soft, less distended  A/P  Continue JP Ambulate, OOB today May leave incision open to air Ok to shower, dry dressing as desired. F/u 1 week post discharge with myself, will remove staples then  Irene Limbo, MD [facility] Plastic & Reconstructive Surgery [phone]

## 2013-11-07 NOTE — Discharge Summary (Signed)
Physician Discharge Summary  Patient ID: [name] MRN: [id number] DOB/AGE: 67-Mar-1947 67 y.o.  Admit date: 11/05/2013 Discharge date: 11/07/2013  Admission Diagnoses: <principal problem not specified>  Discharge Diagnoses:  Active Problems:   Ovarian ca   Discharged Condition: good  Hospital Course: Ms [name] was admitted on 11/07/13 for a resection of an abdominal wall mass and reconstruction with Strata. She had an uncomplicated postoperative course. Her diet was advanced appropriately and she was meeting discharge criteria on POD 2.  Consults: None  Significant Diagnostic Studies: labs: Hb equilabrated at 10.0 postop which is appropriate.  Treatments: IV hydration and analgesia: Vicodin and ibuprofen  Discharge Exam: Blood pressure 98/59, pulse 61, temperature 98.1 F (36.7 C), temperature source Oral, resp. rate 16, height 5\' 7"  (1.702 m), weight 132 lb (59.875 kg), SpO2 99.00%. General appearance: alert Back: symmetric, no curvature. ROM normal. No CVA tenderness. Resp: clear to auscultation bilaterally Chest wall: no tenderness GI: soft, non-tender; bowel sounds normal; no masses,  no organomegaly and incision closed and in tact with staples, no signs of infection Extremities: extremities normal, atraumatic, no cyanosis or edema Skin: Skin color, texture, turgor normal. No rashes or lesions  Disposition: 01-Home or Self Care  Discharge Instructions   (HEART FAILURE PATIENTS) Call MD:  Anytime you have any of the following symptoms: 1) 3 pound weight gain in 24 hours or 5 pounds in 1 week 2) shortness of breath, with or without a dry hacking cough 3) swelling in the hands, feet or stomach 4) if you have to sleep on extra pillows at night in order to breathe.    Complete by:  As directed      Call MD for:  difficulty breathing, headache or visual disturbances    Complete by:  As directed      Call MD for:  extreme fatigue    Complete by:  As directed      Call  MD for:  hives    Complete by:  As directed      Call MD for:  persistant dizziness or light-headedness    Complete by:  As directed      Call MD for:  persistant nausea and vomiting    Complete by:  As directed      Call MD for:  redness, tenderness, or signs of infection (pain, swelling, redness, odor or green/yellow discharge around incision site)    Complete by:  As directed      Call MD for:  severe uncontrolled pain    Complete by:  As directed      Call MD for:  temperature >100.4    Complete by:  As directed      Diet - low sodium heart healthy    Complete by:  As directed      Diet general    Complete by:  As directed      Discharge instructions    Complete by:  As directed   No straining or heavy lifting for 6 weeks     Driving Restrictions    Complete by:  As directed   No driving for 7 days or until off narcotic pain medication     Increase activity slowly    Complete by:  As directed      Remove dressing in 24 hours    Complete by:  As directed      Sexual Activity Restrictions    Complete by:  As directed   No intercourse for 6  weeks            Medication List         CALCIUM 600 + D PO  Take 1 tablet by mouth daily.     DSS 100 MG Caps  Take 100 mg by mouth daily.     enoxaparin 40 MG/0.4ML injection  Commonly known as:  LOVENOX  Inject 0.4 mLs (40 mg total) into the skin daily.     EQL VITAMIN D3 1000 UNITS tablet  Generic drug:  Cholecalciferol  Take 1,000 Units by mouth daily.     estrogens (conjugated) 0.625 MG tablet  Commonly known as:  PREMARIN  Take 0.625 mg by mouth every Monday, Wednesday, and Friday.     Fish Oil 1200 MG Caps  Take 1,200 mg by mouth daily.     HYDROcodone-acetaminophen 5-325 MG per tablet  Commonly known as:  NORCO/VICODIN  Take 1-2 tablets by mouth every 4 (four) hours as needed for moderate pain.     ibuprofen 200 MG tablet  Commonly known as:  ADVIL,MOTRIN  Take 200 mg by mouth every 4 (four) hours as needed  for fever.     ibuprofen 600 MG tablet  Commonly known as:  ADVIL,MOTRIN  Take 1 tablet (600 mg total) by mouth every 6 (six) hours as needed for fever, headache or moderate pain.     levothyroxine 75 MCG tablet  Commonly known as:  SYNTHROID, LEVOTHROID  Take 75 mcg by mouth every morning.     multivitamin with minerals tablet  Take 1 tablet by mouth daily.           Follow-up Information   Follow up with [facility], BRINDA, MD. Schedule an appointment as soon as possible for a visit in 1 week.   Specialty:  Plastic Surgery   Contact information:   [address] [place] [address] [phone]       Signed: Donaciano Eva 11/07/2013, 11:23 AM

## 2013-11-07 NOTE — Progress Notes (Signed)
Pt verbalized understanding of dc instructions with verbalized understanding through teach back and earlier through demonstration. Explained My Chart yet pt has no computer access. Discharged via wc to front entrance to meet awaiting vehicle to carry to sister's home. Accompanied by NT and cousin.

## 2013-11-07 NOTE — Progress Notes (Signed)
Pt given Lovenox kit earlier to review. Discussed with pt how to administer injectable to self. Pt practiced SQ injection with good aseptic technique on second attempt. Stated, "I hope to find someone to help me, I have a nurse friend and a neighbor to help." Reassurance given. Cousin at bedside prepared to take pt to sister's home after dc. Reinforced drain care from earlier teaching w/verbalized understanding. Supplies need for care provided. Scripts x 4 given to pt as provided by MD.

## 2013-11-07 NOTE — Discharge Instructions (Signed)
Ok to shower, soap and water ok. Pat incision dry. May place dry dressing as desired.  Abdominal binder for comfort.  JP to bulb suction, strip and record twice daily and bring log to clinic visit.  11/07/2013  Return to work: 6 weeks  Activity: 1. Be up and out of the bed during the day.  Take a nap if needed.  You may walk up steps but be careful and use the hand rail.  Stair climbing will tire you more than you think, you may need to stop part way and rest.   2. No lifting or straining for 6 weeks.  3. No driving for 4 weeks.  Do Not drive if you are taking narcotic pain medicine.  4. Shower daily.  Use soap and water on your incision and pat dry; don't rub.   5. No sexual activity and nothing in the vagina for 2 weeks.  Diet: 1. Low sodium Heart Healthy Diet is recommended.  2. It is safe to use a laxative if you have difficulty moving your bowels.   Wound Care: 1. Keep clean and dry.  Shower daily.  Reasons to call the Doctor:   Fever - Oral temperature greater than 100.4 degrees Fahrenheit  Foul-smelling vaginal discharge  Difficulty urinating  Nausea and vomiting  Increased pain at the site of the incision that is unrelieved with pain medicine.  Difficulty breathing with or without chest pain  New calf pain especially if only on one side  Sudden, continuing increased vaginal bleeding with or without clots.   Follow-up: 1. See Dr Iran Planas in 1 week for staple removal and drain evaluation  2. See Dr. Alycia Rossetti in 1 month.  Contacts: For questions or concerns you should contact:  Dr. Everitt Amber from Gynecologic Oncology at [phone]

## 2013-11-08 ENCOUNTER — Ambulatory Visit: Payer: Medicare Other

## 2013-11-12 ENCOUNTER — Telehealth (INDEPENDENT_AMBULATORY_CARE_PROVIDER_SITE_OTHER): Payer: Self-pay | Admitting: Gynecologic Oncology

## 2013-11-12 NOTE — Telephone Encounter (Signed)
Spoke with patient. Concerned about suction bulb. It is only putting out 16 ml in 12 hours. She sees her Psychiatric nurse on Thursday. We discussed the pathology and the need for the [facility] Medicine testing. She knows that I will call her and that I have communicated with Dr. Benay Spice and Dr. Barry Dienes. PG

## 2013-11-14 ENCOUNTER — Telehealth: Payer: Self-pay | Admitting: *Deleted

## 2013-11-14 NOTE — Telephone Encounter (Signed)
Pt called to ask if she could stop lovenox shots. Per Dr. Alycia Rossetti pt to continue lovenox for 28 days after surgery. Last dose to be given approximately jul 20. Pt verbalized understanding. No further concerns.

## 2013-11-18 DIAGNOSIS — C7960 Secondary malignant neoplasm of unspecified ovary: Secondary | ICD-10-CM | POA: Diagnosis not present

## 2013-11-18 DIAGNOSIS — Z4889 Encounter for other specified surgical aftercare: Secondary | ICD-10-CM | POA: Diagnosis not present

## 2013-11-22 ENCOUNTER — Other Ambulatory Visit (HOSPITAL_COMMUNITY)
Admission: RE | Admit: 2013-11-22 | Discharge: 2013-11-22 | Disposition: A | Discharge status: Home / Self Care | Payer: Medicare Other | Source: Ambulatory Visit | Attending: Gynecologic Oncology | Admitting: Gynecologic Oncology

## 2013-11-22 DIAGNOSIS — C801 Malignant (primary) neoplasm, unspecified: Secondary | ICD-10-CM | POA: Diagnosis not present

## 2013-12-04 DIAGNOSIS — R1909 Other intra-abdominal and pelvic swelling, mass and lump: Secondary | ICD-10-CM | POA: Diagnosis not present

## 2013-12-04 DIAGNOSIS — C7960 Secondary malignant neoplasm of unspecified ovary: Secondary | ICD-10-CM | POA: Diagnosis not present

## 2013-12-06 ENCOUNTER — Encounter (HOSPITAL_COMMUNITY): Payer: Self-pay

## 2013-12-13 DIAGNOSIS — E040 Nontoxic diffuse goiter: Secondary | ICD-10-CM | POA: Diagnosis not present

## 2013-12-19 ENCOUNTER — Telehealth: Payer: Self-pay | Admitting: Gynecologic Oncology

## 2013-12-19 ENCOUNTER — Other Ambulatory Visit: Payer: Self-pay | Admitting: *Deleted

## 2013-12-19 NOTE — Telephone Encounter (Signed)
TC to patient. Foundation ONE results were not very informative. LM for her to call me back at [facility]. Have scheduled her to see me on 8/26 at 9:45. PG

## 2013-12-20 ENCOUNTER — Ambulatory Visit (HOSPITAL_BASED_OUTPATIENT_CLINIC_OR_DEPARTMENT_OTHER): Payer: Medicare Other | Admitting: Nurse Practitioner

## 2013-12-20 ENCOUNTER — Other Ambulatory Visit (HOSPITAL_BASED_OUTPATIENT_CLINIC_OR_DEPARTMENT_OTHER): Payer: Medicare Other

## 2013-12-20 ENCOUNTER — Ambulatory Visit: Payer: Medicare Other | Admitting: Gynecologic Oncology

## 2013-12-20 ENCOUNTER — Other Ambulatory Visit: Payer: Medicare Other

## 2013-12-20 ENCOUNTER — Telehealth: Payer: Self-pay | Admitting: Nurse Practitioner

## 2013-12-20 VITALS — BP 141/65 | HR 105 | Temp 98.5°F | Resp 18 | Ht 67.0 in | Wt 130.4 lb

## 2013-12-20 DIAGNOSIS — C50919 Malignant neoplasm of unspecified site of unspecified female breast: Secondary | ICD-10-CM

## 2013-12-20 DIAGNOSIS — Z8543 Personal history of malignant neoplasm of ovary: Secondary | ICD-10-CM | POA: Diagnosis not present

## 2013-12-20 DIAGNOSIS — C569 Malignant neoplasm of unspecified ovary: Secondary | ICD-10-CM

## 2013-12-20 DIAGNOSIS — C801 Malignant (primary) neoplasm, unspecified: Secondary | ICD-10-CM

## 2013-12-20 DIAGNOSIS — C779 Secondary and unspecified malignant neoplasm of lymph node, unspecified: Secondary | ICD-10-CM | POA: Diagnosis not present

## 2013-12-20 MED ORDER — ALPRAZOLAM 0.5 MG PO TABS: 0.5000 mg | ORAL_TABLET | Freq: Every evening | ORAL | Status: DC | PRN

## 2013-12-20 NOTE — Telephone Encounter (Signed)
pt confirmed labs/ov per 08/07 POF, gave pt AVS..Marland KitchenKJ

## 2013-12-20 NOTE — Progress Notes (Addendum)
  [facility] OFFICE PROGRESS NOTE   Diagnosis:  Metastatic adenocarcinoma.  INTERVAL HISTORY:   [name] returns as scheduled. She underwent resection of the abdominal wall mass with abdominal wall reconstruction on 11/05/2013. Final pathology showed adenocarcinoma. The specimen was extensively involved by adenocarcinoma which focally involved the edge of the specimen. Immunostains and Foundation 1 testing were not specific for a primary tumor location.  She feels she is recovering well from surgery. She recently returned to work. She denies pain. Bowels moving regularly. No fevers. She has had some difficulty sleeping. She is taking Xanax at bedtime which helps.  Objective:  Vital signs in last 24 hours:  Blood pressure 141/65, pulse 105, temperature 98.5 F (36.9 C), temperature source Oral, resp. rate 18, height 5\' 7"  (1.702 m), weight 130 lb 6.4 oz (59.149 kg), SpO2 100.00%.    HEENT: No thrush or ulcerations. Lymphatics: No palpable cervical, supraclavicular or axillary lymph nodes. Resp: Lungs clear bilaterally. Cardio: Regular rate and rhythm. GI: Abdomen soft and nontender. No mass. No hepatomegaly. Healed low abdominal incision. No surrounding erythema. Small scabbed lesion right mid abdominal wall. Vascular: No leg edema.  Lab Results:  Lab Results  Component Value Date   WBC 7.3 11/07/2013   HGB 10.0* 11/07/2013   HCT 30.5* 11/07/2013   MCV 100.7* 11/07/2013   PLT 208 11/07/2013   NEUTROABS 5.0 11/01/2013    Imaging:  No results found.  Medications: I have reviewed the patient's current medications.  Assessment/Plan: 1. 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 Staging CTs of the chest, abdomen, and pelvis with no other site of metastatic disease or a primary tumor.  Elevated CA 125.  Cycle 1 Taxol/carboplatin 08/16/2013.  Cycle 2 Taxol/carboplatin 09/06/2013.  Cycle 3 Taxol/carboplatin 09/27/2013  CT 10/11/2013 with a  stable abdominal wall mass. Status post resection of abdominal wall mass with abdominal wall reconstruction 11/05/2013. Final pathology showed adenocarcinoma. Specimen extensively involved with adenocarcinoma which focally involved the edge of the specimen. Immunostains and Foundation 1 testing were nonspecific for a primary tumor location. 2. Ovarian cancer in 1988, low-grade adenocarcinoma with areas of "borderline" carcinoma, status post a hysterectomy and bilateral oophorectomy.   Disposition: [name] appears stable. She is recovering from the recent surgery. Dr. Benay Spice reviewed the pathology and Foundation 1 results with her and her sister. Dr. Benay Spice recommends no further treatment at this time. She will be followed on an observation approach. We will obtain a followup CA 125 today. She will return for a followup visit and CEA 125 in 3 months.  She was given a new prescription for Xanax 0.5 mg at bedtime as needed for sleep.  Patient seen with Dr. Benay Spice.    Ned Card ANP/GNP-BC   12/20/2013  11:26 AM   This was a shared visit with Ned Card. [name] was interviewed and examined. She is recovering from the abdominal wall reconstruction. The pathology confirmed adenocarcinoma, but no primary tumor site was identified. I do not recommend further chemotherapy. I see no clear role for radiation since this appears to be a metastatic tumor.  I will present her case at the GI tumor conference. We can consider submitting the tumor for a "unknown primary "gene array assay in an attempt to define a primary tumor site. We decided to wait on this until we see how much she will need to pay for the Foundation 1 testing.  [name], M.D.

## 2013-12-21 LAB — CA 125: CA 125: 12.9 U/mL (ref 0.0–30.2)

## 2013-12-25 ENCOUNTER — Telehealth: Payer: Self-pay

## 2013-12-25 NOTE — Telephone Encounter (Signed)
Left message for patient to call back to inform her her CA 125 was in normal range per Marlynn Perking.

## 2013-12-26 NOTE — Telephone Encounter (Signed)
Patient called back and was given normal CA 125 results.

## 2014-01-02 ENCOUNTER — Telehealth: Payer: Self-pay | Admitting: *Deleted

## 2014-01-02 NOTE — Telephone Encounter (Signed)
Per request of Dr. Benay Spice, patient was referred to Dr. Lisbeth Renshaw for radiation consultation.  Spoke with patient by phone to confirm appointment with Dr. Lisbeth Renshaw on 01/06/14.

## 2014-01-03 ENCOUNTER — Encounter: Payer: Self-pay | Admitting: Radiation Oncology

## 2014-01-03 NOTE — Progress Notes (Signed)
GI Location of Tumor / Histology: Abdominal wall mass-Metastatic Adenocarcinoma  [name] presented  months ago with symptoms of: abdominal pain and palpable mass suprapubic region  Biopsies of  (if applicable) revealed: Diagnosis 11/05/2013: Soft tissue mass, simple excision, abdominal wall mass ADENOCARCINOMA. Microscopic Comment The specimen is extensively involved by adenocarcinoma which focally involves the edge of the specimen. Immunohistochemistry is performed and the tumor shows patchy positivity with Napsin-A and is negative with WT-1, estrogen receptor, progesterone receptor, gross disease cystic fluid protein, CDX-2, carcinoembryonic antigen, thyroid transcription factor-1, CD10 and CD117. The immunophenotype is nonspecific and additional immunohistochemistry will be performed and reported as an addendum. (JDP:kh 11/07/13) ADDENDUM: Additional immunohistochemistry is performed and the tumor is positive with cytokeratin AE1/AE3 and cytokeratin 7 and is negative with Calretinin, cytokeratin 5/6 and cytokeratin 20. The immunoreactivity is not specific for location of a primary tumor. (JDP:kh 11-08-13) Diagnosis 07/30/2013: Soft Tissue Needle Core Biopsy, abdominal wall mass - METASTATIC ADENOCARCINOMA INVOLVING SOFT TISSUE, SEE COMMENT. Microscopic Comment Needle core biopsies demonstrate diffuse involvement by well differentiated adenocarcinoma. The adenocarcinoma has the following immunophenotype: Cytokeratin 7 - strong diffuse expression Cytokeratin 20 - negative expression CDX2 - negative expression TTF-1 - negative expression Estrogen receptor - negative expression Vimentin - patchy mild to moderate expression The clinical history of hysterectomy for "possible endometrial cancer" is noted. The morphology and immunophenotype of the current biopsies is consistent with metastatic adenocarcinoma. Given the clinical history, metastasis from a gynecological primary is a  consideration. However, metastasis from a primary lung, upper gastrointestinal tract or pancreatic-biliary tumor is not definitively excluded. The case was reviewed with Dr. Patrick, who concurs. (CRR:caf 08/01/13)  Past/Anticipated interventions by surgeon, if any: 11/07/13:  Resection of abdominal wall mass consist ant with recurrent Ovarian CancerDr. Brinda Thimmappa,MD,Plastic Surgeon, Dr. Faera Byerly,MD assistant Surgeon  Dr. Gehrig 11/05/13 Resection abdominal wall mass,with fascia and right rectus  1. Past/Anticipated interventions by medical oncology, if any: Dr.Sherrill 10/18/13 note:Metastatic adenocarcinoma involving a low abdominal wall mass. Staging CTs of the chest, abdomen, and pelvis with no other site of metastatic disease or a primary tumor.  Elevated CA 125.  Cycle 1 Taxol/carboplatin 08/16/2013.  Cycle 2 Taxol/carboplatin 09/06/2013.  Cycle 3 Taxol/carboplatin 09/27/2013  CT 10/11/2013 with a stable abdominal wall mass 2. Ovarian cancer in 1988, low-grade adenocarcinoma with areas of "borderline" carcinoma, status post a hysterectomy and bilateral oophorectomy.Lisa K>Thomas,NP seen patient 12/20/13/Dr. Sherrill,Gary,MD    Weight changes, if any: loss a couple pounds last month  Bowel/Bladder complaints, if any: regular bowel movements, bladder good no c/o  Nausea / Vomiting, if any:none  Pain issues, if any:  Occasional discomfort left lower abd/groin area not every day  SAFETY ISSUES:  Prior radiation? No  Pacemaker/ICD? No  Possible current pregnancy? no  Is the patient on methotrexate? no  Current Complaints / other details:  Single, no children, last Menses 09/1983,   09/24/86 Ovarian cancer/ total hysterectomy /bso, Endometriosis,, 2 sisters with melanoma,maternal grandmother breast ca, paternal grandfather prostate ca, mother heart disease,   Allergies:NKDA 

## 2014-01-06 ENCOUNTER — Ambulatory Visit
Admission: RE | Admit: 2014-01-06 | Discharge: 2014-01-06 | Disposition: A | Discharge status: Home / Self Care | Payer: Medicare Other | Source: Ambulatory Visit | Attending: Radiation Oncology | Admitting: Radiation Oncology

## 2014-01-06 ENCOUNTER — Encounter: Payer: Self-pay | Admitting: Radiation Oncology

## 2014-01-06 VITALS — BP 133/81 | HR 100 | Temp 98.1°F | Resp 20 | Ht 67.0 in | Wt 129.8 lb

## 2014-01-06 DIAGNOSIS — E079 Disorder of thyroid, unspecified: Secondary | ICD-10-CM | POA: Insufficient documentation

## 2014-01-06 DIAGNOSIS — C801 Malignant (primary) neoplasm, unspecified: Secondary | ICD-10-CM

## 2014-01-06 DIAGNOSIS — Z9089 Acquired absence of other organs: Secondary | ICD-10-CM | POA: Insufficient documentation

## 2014-01-06 DIAGNOSIS — Z9079 Acquired absence of other genital organ(s): Secondary | ICD-10-CM | POA: Diagnosis not present

## 2014-01-06 DIAGNOSIS — C494 Malignant neoplasm of connective and soft tissue of abdomen: Secondary | ICD-10-CM

## 2014-01-06 DIAGNOSIS — Z8543 Personal history of malignant neoplasm of ovary: Secondary | ICD-10-CM | POA: Insufficient documentation

## 2014-01-06 DIAGNOSIS — Z510 Encounter for antineoplastic radiation therapy: Secondary | ICD-10-CM | POA: Insufficient documentation

## 2014-01-06 DIAGNOSIS — C44509 Unspecified malignant neoplasm of skin of other part of trunk: Secondary | ICD-10-CM

## 2014-01-06 DIAGNOSIS — Z90710 Acquired absence of both cervix and uterus: Secondary | ICD-10-CM | POA: Diagnosis not present

## 2014-01-06 DIAGNOSIS — F411 Generalized anxiety disorder: Secondary | ICD-10-CM | POA: Insufficient documentation

## 2014-01-06 DIAGNOSIS — Z9221 Personal history of antineoplastic chemotherapy: Secondary | ICD-10-CM | POA: Diagnosis not present

## 2014-01-06 HISTORY — DX: Anxiety disorder, unspecified: F41.9

## 2014-01-06 NOTE — Progress Notes (Signed)
Please see the Nurse Progress Note in the MD Initial Consult Encounter for this patient. 

## 2014-01-07 ENCOUNTER — Encounter: Payer: Self-pay | Admitting: *Deleted

## 2014-01-07 NOTE — Progress Notes (Signed)
[facility] Psychosocial Distress Screening Clinical Social Work  Clinical Social Work was referred by distress screening protocol.  The patient scored a 5 on the Psychosocial Distress Thermometer which indicates moderate distress. Clinical Social Worker contacted patient at home to assess for distress and other psychosocial needs.  Patient stated she was doing "ok", but was feeling anxious about radiation.  Patient stated she has a strong support system in her church family, and is taking things "one day at a time".  CSW offered additional support and informed patient on the support team and support services at [facility].  CSW encouraged patient to call with questions or concerns.     Clinical Social Worker follow up needed: No.   ONCBCN DISTRESS SCREENING 01/06/2014  Screening Type Initial Screening  Mark the number that describes how much distress you have been experiencing in the past week 5  Emotional problem type Nervousness/Anxiety  Physician notified of physical symptoms Yes  Referral to clinical social work No;Yes   Johnnye Lana, MSW, LCSW, OSW-C Clinical Social Worker [place] [phone]

## 2014-01-08 ENCOUNTER — Ambulatory Visit
Admission: RE | Admit: 2014-01-08 | Discharge: 2014-01-08 | Disposition: A | Discharge status: Home / Self Care | Payer: Medicare Other | Source: Ambulatory Visit | Attending: Radiation Oncology | Admitting: Radiation Oncology

## 2014-01-08 ENCOUNTER — Ambulatory Visit: Payer: Medicare Other | Attending: Gynecologic Oncology | Admitting: Gynecologic Oncology

## 2014-01-08 ENCOUNTER — Encounter: Payer: Self-pay | Admitting: Gynecologic Oncology

## 2014-01-08 VITALS — BP 135/64 | HR 108 | Temp 97.6°F | Resp 18 | Wt 129.0 lb

## 2014-01-08 DIAGNOSIS — K5730 Diverticulosis of large intestine without perforation or abscess without bleeding: Secondary | ICD-10-CM | POA: Insufficient documentation

## 2014-01-08 DIAGNOSIS — F411 Generalized anxiety disorder: Secondary | ICD-10-CM | POA: Diagnosis not present

## 2014-01-08 DIAGNOSIS — E079 Disorder of thyroid, unspecified: Secondary | ICD-10-CM | POA: Diagnosis not present

## 2014-01-08 DIAGNOSIS — R1900 Intra-abdominal and pelvic swelling, mass and lump, unspecified site: Secondary | ICD-10-CM

## 2014-01-08 DIAGNOSIS — Z9221 Personal history of antineoplastic chemotherapy: Secondary | ICD-10-CM | POA: Diagnosis not present

## 2014-01-08 DIAGNOSIS — Z8543 Personal history of malignant neoplasm of ovary: Secondary | ICD-10-CM | POA: Diagnosis not present

## 2014-01-08 DIAGNOSIS — Z79899 Other long term (current) drug therapy: Secondary | ICD-10-CM | POA: Insufficient documentation

## 2014-01-08 DIAGNOSIS — Z510 Encounter for antineoplastic radiation therapy: Secondary | ICD-10-CM | POA: Diagnosis not present

## 2014-01-08 DIAGNOSIS — K219 Gastro-esophageal reflux disease without esophagitis: Secondary | ICD-10-CM | POA: Insufficient documentation

## 2014-01-08 DIAGNOSIS — Z90710 Acquired absence of both cervix and uterus: Secondary | ICD-10-CM | POA: Diagnosis not present

## 2014-01-08 DIAGNOSIS — C494 Malignant neoplasm of connective and soft tissue of abdomen: Secondary | ICD-10-CM | POA: Diagnosis not present

## 2014-01-08 DIAGNOSIS — C801 Malignant (primary) neoplasm, unspecified: Secondary | ICD-10-CM

## 2014-01-08 MED ORDER — ESTROGENS CONJUGATED 0.625 MG PO TABS: 0.6250 mg | ORAL_TABLET | ORAL | Status: DC

## 2014-01-08 NOTE — Progress Notes (Signed)
Radiation Oncology         [phone] ________________________________  Name: [name] MRN: [id number]  Date: 01/06/2014  DOB: 01/02/1946  HG:[id number] J., MD  Redmond School, MD     REFERRING PHYSICIAN: Redmond School, MD   DIAGNOSIS: The primary encounter diagnosis was Adenocarcinoma of abdominal wall, unclear primary. A diagnosis of Malignant neoplasm of abdominal wall was also pertinent to this visit.   HISTORY OF PRESENT ILLNESS::[name] is a 68 y.o. female who is seen for an initial consultation visit. The patient notably has a history of ovarian cancer which was diagnosed in 69. This was a low grade adenocarcinoma with areas of "borderline" carcinoma. She underwent hysterectomy/bilateral move rectum he. The patient did not receive additional treatment for this and she denies any history of radiation treatment.  The patient indicates that she noticed some fullness in the abdominal region which states that a number of months. This became more prominent and prompted further workup. The patient's workup included a CT scan of the abdomen and pelvis on 07/19/2013. This showed a soft tissue mass measuring 9.7 cm in maximum dimension which was present within the lower rectus musculature. No lymphadenopathy or other findings indicative of additional regional or distant disease. This was consistent with a CT scan of the chest on 08/06/2013 which did not reveal any evidence of metastases. A biopsy on 07/30/2013 demonstrated adenocarcinoma involving the soft tissue. The patient proceeded to undergo systemic treatment comprised of Taxol/carboplatin. She underwent 3 cycles and then underwent a CT scan of the abdomen and pelvis subsequently on 5 29,015. This showed that the soft tissue mass was stable to minimally smaller.  The patient proceeded to undergo resection on 11/05/2013. This revealed an adenocarcinoma. The immunophenotype was nonspecific. The specimen was  extensively involved by adenocarcinoma and locally involved the edge of the resection specimen.  The patient has been recovering well she states from surgery. No specific difficulties.   PREVIOUS RADIATION THERAPY: No   PAST MEDICAL HISTORY:  has a past medical history of Ovarian cyst; Thyroid disease; Hypercholesteremia; GERD (gastroesophageal reflux disease); Restless leg syndrome; PONV (postoperative nausea and vomiting); Cancer (1988, 2015); and Anxiety.     PAST SURGICAL HISTORY: Past Surgical History  Procedure Laterality Date  . Oophorectomy      BSO  . Total abdominal hysterectomy  1988    ovarian cyst  BSO  . Cholecystectomy  2009  . Tonsillectomy  as child  . Laparotomy N/A 11/05/2013    Procedure: RESECTION OF PELVIC MASS;  Surgeon: Imagene Gurney A. Alycia Rossetti, MD;  Location: [facility];  Service: Gynecology;  Laterality: N/A;  . Application of a-cell of chest/abdomen N/A 11/05/2013    Procedure: ABDOMINAL WALL RESCONTRUCTION WITH STRATUS;  Surgeon: Irene Limbo, MD;  Location: [facility];  Service: Plastics;  Laterality: N/A;  . Appendectomy       FAMILY HISTORY: family history includes Breast cancer in her paternal grandmother; Cancer in her paternal grandfather, paternal grandmother, and sister; Diabetes in her sister; Heart disease in her mother and sister.   SOCIAL HISTORY:  reports that she has never smoked. She has never used smokeless tobacco. She reports that she does not drink alcohol or use illicit drugs.   ALLERGIES: Review of patient's allergies indicates no known allergies.   MEDICATIONS:  Current Outpatient Prescriptions  Medication Sig Dispense Refill  . ALPRAZolam (XANAX) 0.5 MG tablet Take 1 tablet (0.5 mg total) by mouth at bedtime as needed for anxiety. Or sleep  30  tablet  1  . Calcium Carb-Cholecalciferol (CALCIUM 600 + D PO) Take 1 tablet by mouth daily.      . Cholecalciferol (EQL VITAMIN D3) 1000 UNITS tablet Take 1,000 Units by mouth daily.      Marland Kitchen  levothyroxine (SYNTHROID, LEVOTHROID) 75 MCG tablet Take 75 mcg by mouth every morning.       . Multiple Vitamins-Minerals (MULTIVITAMIN WITH MINERALS) tablet Take 1 tablet by mouth daily.       . Omega-3 Fatty Acids (FISH OIL) 1200 MG CAPS Take 1,200 mg by mouth daily.       Marland Kitchen docusate sodium 100 MG CAPS Take 100 mg by mouth daily.  10 capsule  0  . estrogens, conjugated, (PREMARIN) 0.625 MG tablet Take 1 tablet (0.625 mg total) by mouth every Monday, Wednesday, and Friday.  30 tablet  6  . ibuprofen (ADVIL,MOTRIN) 200 MG tablet Take 200 mg by mouth every 4 (four) hours as needed for fever.       Marland Kitchen ibuprofen (ADVIL,MOTRIN) 600 MG tablet Take 1 tablet (600 mg total) by mouth every 6 (six) hours as needed for fever, headache or moderate pain.  30 tablet  0   No current facility-administered medications for this encounter.     REVIEW OF SYSTEMS:  A 15 point review of systems is documented in the electronic medical record. This was obtained by the nursing staff. However, I reviewed this with the patient to discuss relevant findings and make appropriate changes.  Pertinent items are noted in HPI.    PHYSICAL EXAM:  height is 5\' 7"  (1.702 m) and weight is 129 lb 12.8 oz (58.877 kg). Her oral temperature is 98.1 F (36.7 C). Her blood pressure is 133/81 and her pulse is 100. Her respiration is 20 and oxygen saturation is 100%.   ECOG = 1  0 - Asymptomatic (Fully active, able to carry on all predisease activities without restriction)  1 - Symptomatic but completely ambulatory (Restricted in physically strenuous activity but ambulatory and able to carry out work of a light or sedentary nature. For example, light housework, office work)  2 - Symptomatic, <50% in bed during the day (Ambulatory and capable of all self care but unable to carry out any work activities. Up and about more than 50% of waking hours)  3 - Symptomatic, >50% in bed, but not bedbound (Capable of only limited self-care, confined  to bed or chair 50% or more of waking hours)  4 - Bedbound (Completely disabled. Cannot carry on any self-care. Totally confined to bed or chair)  5 - Death   Eustace Pen MM, Creech RH, Tormey DC, et al. [phone]). "Toxicity and response criteria of the [facility]". [place] Oncol. 5 (6): 649-55  General: Well-developed, in no acute distress HEENT: Normocephalic, atraumatic; oral cavity clear Neck: Supple without any lymphadenopathy Cardiovascular: Regular rate and rhythm Respiratory: Clear to auscultation bilaterally GI: Soft, nontender, normal bowel sounds, well-healed incision horizontally within the lower abdomen. Drain site healing superiorly on the right Extremities: No edema present Neuro: No focal deficits     LABORATORY DATA:  Lab Results  Component Value Date   WBC 7.3 11/07/2013   HGB 10.0* 11/07/2013   HCT 30.5* 11/07/2013   MCV 100.7* 11/07/2013   PLT 208 11/07/2013   Lab Results  Component Value Date   NA 142 11/07/2013   K 4.1 11/07/2013   CL 105 11/07/2013   CO2 28 11/07/2013   Lab Results  Component  Value Date   ALT 13 11/01/2013   AST 19 11/01/2013   ALKPHOS 60 11/01/2013   BILITOT <0.2* 11/01/2013      RADIOGRAPHY: No results found.     IMPRESSION: The patient has now undergone chemotherapy and resection of an adenocarcinoma which was present within the lower rectus musculature. She has a history of ovarian cancer remotely from 19. We have discussed the patient's case in multidisciplinary GI conference. It was felt that this represents a recurrence of her ovarian cancer most likely. Formally this would be staged as stage IV disease versus regional recurrence affiliated with the surgical site. No additional sites of disease other primary tumor were found on systemic workup.   The consensus from multidisciplinary GI conference was that the patient nevertheless is in a favorable category given the long natural history of this presumed process,  the absence of other disease, and the fact that this tumor was affiliated with the prior surgical incision.  We therefore felt that the patient would benefit from aggressive local treatment given the positive margin.   I have therefore discussed with the patient a possible course of radiation treatment. I discussed the possible benefit of such a treatment in terms of improvement in local call. Also discussed with the patient the possible side effects and risks of treatment. All of her questions were answered.  PLAN: The patient indicates that she wishes to proceed with treatment. If we are going to treat the patient for potential long-term control, then I would favor a course of treatment analogous to a standard postoperative length of treatment for this location which would consist of approximately 6 weeks of radiation treatment. This wasn't afford the highest chance of local control. I discussed this with the patient and we will schedule the patient for simulation in the near future such that we can proceed with treatment planning.     I spent 60 minutes face to face with the patient and more than 50% of that time was spent in counseling and/or coordination of care.    ________________________________   [name], MD, PhD   **Disclaimer: This note was dictated with voice recognition software. Similar sounding words can inadvertently be transcribed and this note may contain transcription errors which may not have been corrected upon publication of note.**

## 2014-01-08 NOTE — Progress Notes (Signed)
Consult Note: Gyn-Onc  [name] 68 y.o. female  CC:  Chief Complaint  Patient presents with  . Ovarian Cancer    HPI: Patient is seen today in consultation at the request of Dr. Barry Dienes.   Patient 68 year old gravida 0 who in 1988 underwent a total dominant hysterectomy bilateral salpingo-oophorectomy by Dr. Ree Edman. 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 Her washings were negative. CA 125 at the time of diagnosis was 33. She's been doing well without significant complaints since that time. She's had excellent routine medical care. For the past several months she felt that her stomach "old age" in the shower and felt that she was gaining weight. She scratched her abdomen felt that she felt a mass and had some discomfort and she went to see her primary care physician Dr. Gerarda Fraction. A CT scan of the chest abdomen and pelvis was ordered on 08/06/2013 but revealed:  FINDINGS:  Negative lung bases. No pericardial or pleural effusion. No acute osseous abnormality identified. Advanced L4-L5 disc degeneration. Lobulated soft tissue mass arising just to the right of midline in the lower abdomen and suprapubic region, inseparable from both caudal rectus muscles, more so the right. Approaching the mass the right rectus muscle is expanded (series 2, image 57. The mass is multilobulated and encompasses 55 x 79 x 97 cm.There is a narrow fat plane between the mass and underlying small bowel and bladder within the pelvis. No inguinal or pelvic sidewall lymphadenopathy. No pelvic free fluid. Uterus and adnexa not identified and appear to be surgically absent. Negative distal colon except for retained stool and diverticulosis. Left colon, transverse colon, right colon within normal limits. Appendix not identified. Oral contrast has almost reached the terminal ileum. No dilated small bowel. Fairly decompressed stomach.  Negative duodenum. Surgically absent gallbladder. Mild intra and extrahepatic biliary ductal enlargement likely postoperative. Normal liver enhancement. Spleen, pancreas, and adrenal glands are normal. Portal venous system within normal limits. Major arterial structures in the abdomen and pelvis are patent. Aortoiliac calcified atherosclerosis noted. No abdominal free fluid. No lymphadenopathy. Kidneys within normal limits; small left upper pole low-density area with simple fluid densitometry.  IMPRESSION:  1. Lower abdominal/suprapubic ventral wall lobulated soft tissue mass, arising within the lower rectus musculature more so on the right. 5.5 x 7.9 x 9.7 cm. Favor sarcoma or other connective tissue tumor. This should be amenable to percutaneous biopsy. In a younger female patient, endometriosis within a Cesarean section scar would also be a consideration for this appearance.  2. Narrow fat plane between the mass in #1 and underlying small bowel and bladder. No lymphadenopathy. No ascites.  3. Surgically absent gallbladder, uterus, and adnexa. Diverticulosis of the colon.  She underwent a core biopsy of the lesion on March 17 that revealed: Diagnosis Soft Tissue Needle Core Biopsy, abdominal wall mass - METASTATIC ADENOCARCINOMA INVOLVING SOFT TISSUE, SEE COMMENT. Microscopic Comment Needle core biopsies demonstrate diffuse involvement by well differentiated adenocarcinoma. The adenocarcinoma has the following immunophenotype: Cytokeratin 7 - strong diffuse expression Cytokeratin 20 - negative expression CDX2 - negative expression TTF-1 - negative expression Estrogen receptor - negative expression Vimentin - patchy mild to moderate expression The clinical history of hysterectomy for "possible endometrial cancer" is noted. The morphology and immunophenotype of the current biopsies is consistent with metastatic adenocarcinoma. Given the clinical history, metastasis from a gynecological primary is  a consideration. However, metastasis from a primary  lung, upper gastrointestinal tract or pancreatic-biliary tumor is not definitively excluded.  With a biopsy was not definitive, given her past history as well no other primary site was felt that this very well could be a recurrence of her primary ovarian tumor. Use in the area of her prior transverse skin incision. There is a discussion with Dr. Barry Dienes, Dr. Benay Spice, plastic surgery, and myself it was felt that it would be a fairly radical dissection to proceed with surgery at this time and the decision was made to approach her with neoadjuvant chemotherapy. Her CA 125 was elevated at the time of cycle #1 of chemotherapy at 128. She's another cycle of chemotherapy scheduled for April 24. She's overall tolerating her chemotherapy quite well. She had diarrhea for one day presented by his been feeling well. She is up-to-date on her mammograms. Her colonoscopy 2-3 years ago and was recommended she'll follow up in 5 years. Family history significant for paternal grandmother with breast cancer in her 15s. She has 2 sisters who have had melanoma felt to be due to sun exposure. The patient herself does see a dermatologist.  She's completed her third cycle of paclitaxel and carboplatin on Sep 28 2011. Her CA 125 at that time was 147 which is not significantly changed from her pre-chemotherapy value of 128. She had a CT scan that revealed: FINDINGS:  Lung bases show no acute findings. Heart size normal. No pericardial or pleural effusion. Liver is unremarkable. Cholecystectomy with stable intrahepatic and extrahepatic biliary duct prominence. Adrenal glands and right kidney are unremarkable. Low-attenuation lesions in the left kidney measure up to 1.4 cm, as before and are likely cysts. Kidneys, spleen, pancreas, stomach and bowel are unremarkable. A heterogeneous mass centered in the inferior right rectus abdominus muscle measures 5.3 x 7.5 cm (previously 5.5 x 7.9  cm). No pathologically enlarged lymph nodes. Scattered atherosclerotic calcification of the arterial vasculature without abdominal aortic aneurysm. No free fluid. No worrisome lytic or sclerotic lesions.  Degenerative changes are seen in the spine.  IMPRESSION:  Right rectus abdominus mass is stable to very minimally smaller than on 07/19/2013.  Surgery 11/05/13: Findings Operative findings: 8 involving the subcutaneous tissues, fascia, and rectus on the right side. No obvious intraperitoneal disease. Normal-appearing omentum    Biopsies of (if applicable) revealed: Diagnosis 11/05/2013:  Soft tissue mass, simple excision, abdominal wall mass  ADENOCARCINOMA.  Microscopic Comment  The specimen is extensively involved by adenocarcinoma which focally involves the edge of the specimen. Immunohistochemistry is performed and the tumor shows patchy positivity with Napsin-A and is negative with WT-1, estrogen receptor, progesterone receptor, gross disease cystic fluid protein, CDX-2, carcinoembryonic antigen, thyroid transcription factor-1, CD10 and CD117. The immunophenotype is nonspecific and additional immunohistochemistry will be performed and reported as an addendum. (JDP:kh 11/07/13) ADDENDUM: Additional immunohistochemistry is performed and the tumor is positive with cytokeratin AE1/AE3 and cytokeratin 7 and is negative with Calretinin, cytokeratin 5/6 and cytokeratin 20. The immunoreactivity is not specific for location of a primary tumor. (JDP:kh 11-08-13)   Diagnosis 07/30/2013:  Soft Tissue Needle Core Biopsy, abdominal wall mass  - METASTATIC ADENOCARCINOMA INVOLVING SOFT TISSUE, SEE COMMENT.  Microscopic Comment  Needle core biopsies demonstrate diffuse involvement by well differentiated adenocarcinoma. The adenocarcinoma has the following immunophenotype:  Cytokeratin 7 - strong diffuse expression  Cytokeratin 20 - negative expression  CDX2 - negative expression  TTF-1 - negative expression   Estrogen receptor - negative expression  Vimentin - patchy mild to moderate expression  The clinical  history of hysterectomy for "possible endometrial cancer" is noted. The morphology and immunophenotype of the current biopsies is consistent with metastatic adenocarcinoma. Given the clinical history, metastasis from a gynecological primary is a consideration. However, metastasis from a primary lung, upper gastrointestinal tract or pancreatic-biliary tumor is not definitively excluded.   She comes in today for her postoperative check. She's doing very well. She was seen by radiation oncology yesterday is very worried about the long time potential complications of radiation. She has no abdominal or pelvic pain. We discussed her CA 125 is down to 12.9 which is normal. I discussed with her that that would be something that would be continued to be checked on a routine basis during followup.  Current Meds:  Outpatient Encounter Prescriptions as of 01/08/2014  Medication Sig  . ALPRAZolam (XANAX) 0.5 MG tablet Take 1 tablet (0.5 mg total) by mouth at bedtime as needed for anxiety. Or sleep  . Calcium Carb-Cholecalciferol (CALCIUM 600 + D PO) Take 1 tablet by mouth daily.  . Cholecalciferol (EQL VITAMIN D3) 1000 UNITS tablet Take 1,000 Units by mouth daily.  Marland Kitchen estrogens, conjugated, (PREMARIN) 0.625 MG tablet Take 0.625 mg by mouth every Monday, Wednesday, and Friday.   . levothyroxine (SYNTHROID, LEVOTHROID) 75 MCG tablet Take 75 mcg by mouth every morning.   . Multiple Vitamins-Minerals (MULTIVITAMIN WITH MINERALS) tablet Take 1 tablet by mouth daily.   . Omega-3 Fatty Acids (FISH OIL) 1200 MG CAPS Take 1,200 mg by mouth daily.   Marland Kitchen docusate sodium 100 MG CAPS Take 100 mg by mouth daily.  Marland Kitchen ibuprofen (ADVIL,MOTRIN) 200 MG tablet Take 200 mg by mouth every 4 (four) hours as needed for fever.   Marland Kitchen ibuprofen (ADVIL,MOTRIN) 600 MG tablet Take 1 tablet (600 mg total) by mouth every 6 (six) hours as needed for  fever, headache or moderate pain.    Allergy: No Known Allergies  Social Hx:   History   Social History  . Marital Status: Single    Spouse Name: N/A    Number of Children: N/A  . Years of Education: N/A   Occupational History  . Not on file.   Social History Main Topics  . Smoking status: Never Smoker   . Smokeless tobacco: Never Used  . Alcohol Use: No  . Drug Use: No  . Sexual Activity: No     Comment: HYST   Other Topics Concern  . 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 Oophorectomy      BSO  . Total abdominal hysterectomy  1988    ovarian cyst  BSO  . Cholecystectomy  2009  . Tonsillectomy  as child  . Laparotomy N/A 11/05/2013    Procedure: RESECTION OF PELVIC MASS;  Surgeon: Imagene Gurney A. Alycia Rossetti, MD;  Location: [facility];  Service: Gynecology;  Laterality: N/A;  . Application of a-cell of chest/abdomen N/A 11/05/2013    Procedure: ABDOMINAL WALL RESCONTRUCTION WITH STRATUS;  Surgeon: Irene Limbo, MD;  Location: [facility];  Service: Plastics;  Laterality: N/A;  . Appendectomy      Past Medical Hx:  Past Medical History  Diagnosis Date  . Ovarian cyst   . Thyroid disease   .  Hypercholesteremia     under control with diet and fish oil  . GERD (gastroesophageal reflux disease)     hx of years ago  . Restless leg syndrome   . PONV (postoperative nausea and vomiting)   . 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 Anxiety     new dx    Oncology Hx:    Adenocarcinoma of abdominal wall, unclear primary   08/09/2013 Initial Diagnosis Adenocarcinoma of abdominal wall, unclear primary   08/16/2013 -  Chemotherapy paclitaxel and carboplatin    Family Hx:  Family History  Problem Relation Age of  Onset  . Heart disease Mother   . Heart disease Sister   . Diabetes Sister   . Cancer Sister     melanoma-skin cancer  . Breast cancer Paternal Grandmother     Age 50's  . Cancer Paternal Grandmother     breast  . Cancer Paternal Grandfather     prostate/bladder    Vitals:  Blood pressure 135/64, pulse 108, temperature 97.6 F (36.4 C), temperature source Oral, resp. rate 18, weight 129 lb (58.514 kg).  Physical Exam: Well-nourished well-developed female who appears younger than stated age in no acute distress.   Abdomen: Well-healed transverse Pfannenstiel skin incision. There is no fluid wave is no other palpable masses. There is no hepatomegaly.  Assessment/Plan: 68 year old with a history of adenocarcinoma within the left ovary found incidentally at the time of a total abdominal hysterectomy bilateral salpingo-oophorectomy. This was in 1988. She did not require any additional treatment. I do not have a final pathology report. This was an incidental note made in her discharge summary. We are treating this as an ovarian cancer recurrence with a neoadjuvant approach.  The mass has not had a significant improvement with regards to size. Therefore we believe that surgical resection would be of benefit for her.  She underwent surgery in June which was uncomplicated. The tumor is not clearly consistent with a gynecologic malignancy. Additional blood testing was not very beneficial. She did have a focally positive margin and there is recommendation for radiation therapy. It sounds like the plan is for pelvic radiation. I will speak to Dr. Lisbeth Renshaw in radiation oncology to evaluate the radiation plan. The patient did not have any peritoneal based disease. She'll return to see me in 3-4 months. Gordon Vandunk A., MD 01/08/2014, 10:00 AM

## 2014-01-08 NOTE — Patient Instructions (Signed)
Return to clinic in 3-4 months to see Dr. Alycia Rossetti.

## 2014-01-13 DIAGNOSIS — Z90710 Acquired absence of both cervix and uterus: Secondary | ICD-10-CM | POA: Diagnosis not present

## 2014-01-13 DIAGNOSIS — Z8543 Personal history of malignant neoplasm of ovary: Secondary | ICD-10-CM | POA: Diagnosis not present

## 2014-01-13 DIAGNOSIS — F411 Generalized anxiety disorder: Secondary | ICD-10-CM | POA: Diagnosis not present

## 2014-01-13 DIAGNOSIS — Z510 Encounter for antineoplastic radiation therapy: Secondary | ICD-10-CM | POA: Diagnosis not present

## 2014-01-13 DIAGNOSIS — C494 Malignant neoplasm of connective and soft tissue of abdomen: Secondary | ICD-10-CM | POA: Diagnosis not present

## 2014-01-13 DIAGNOSIS — E079 Disorder of thyroid, unspecified: Secondary | ICD-10-CM | POA: Diagnosis not present

## 2014-01-15 ENCOUNTER — Ambulatory Visit
Admission: RE | Admit: 2014-01-15 | Discharge: 2014-01-15 | Disposition: A | Discharge status: Home / Self Care | Payer: Medicare Other | Source: Ambulatory Visit | Attending: Radiation Oncology | Admitting: Radiation Oncology

## 2014-01-15 ENCOUNTER — Encounter: Payer: Self-pay | Admitting: Radiation Oncology

## 2014-01-15 DIAGNOSIS — C494 Malignant neoplasm of connective and soft tissue of abdomen: Secondary | ICD-10-CM | POA: Diagnosis not present

## 2014-01-15 DIAGNOSIS — E079 Disorder of thyroid, unspecified: Secondary | ICD-10-CM | POA: Diagnosis not present

## 2014-01-15 DIAGNOSIS — F411 Generalized anxiety disorder: Secondary | ICD-10-CM | POA: Diagnosis not present

## 2014-01-15 DIAGNOSIS — Z510 Encounter for antineoplastic radiation therapy: Secondary | ICD-10-CM | POA: Diagnosis not present

## 2014-01-15 DIAGNOSIS — Z8543 Personal history of malignant neoplasm of ovary: Secondary | ICD-10-CM | POA: Diagnosis not present

## 2014-01-15 DIAGNOSIS — Z90710 Acquired absence of both cervix and uterus: Secondary | ICD-10-CM | POA: Diagnosis not present

## 2014-01-15 DIAGNOSIS — C801 Malignant (primary) neoplasm, unspecified: Secondary | ICD-10-CM

## 2014-01-15 MED ORDER — RADIAPLEXRX EX GEL
Freq: Once | CUTANEOUS | Status: AC
  Administered 2014-01-15: 13:00:00 via TOPICAL

## 2014-01-15 NOTE — Progress Notes (Signed)
Patient education  Done, radiation therapy and you  Book, my business card,radiaplex gel given, skin irritation, fatigue,pain,  nausea,vomiting,diarrhea, eat smaller bland foods, 5-6 smaller meals throughout the day,  low fiber for diarrhea, have imodium ad OTC on hand for diarrhea,increase protein in diet,stay hydrated, , all questins answered,teachback

## 2014-01-16 ENCOUNTER — Ambulatory Visit
Admission: RE | Admit: 2014-01-16 | Discharge: 2014-01-16 | Disposition: A | Discharge status: Home / Self Care | Payer: Medicare Other | Source: Ambulatory Visit | Attending: Radiation Oncology | Admitting: Radiation Oncology

## 2014-01-16 DIAGNOSIS — Z510 Encounter for antineoplastic radiation therapy: Secondary | ICD-10-CM | POA: Diagnosis not present

## 2014-01-16 DIAGNOSIS — E079 Disorder of thyroid, unspecified: Secondary | ICD-10-CM | POA: Diagnosis not present

## 2014-01-16 DIAGNOSIS — Z8543 Personal history of malignant neoplasm of ovary: Secondary | ICD-10-CM | POA: Diagnosis not present

## 2014-01-16 DIAGNOSIS — C494 Malignant neoplasm of connective and soft tissue of abdomen: Secondary | ICD-10-CM | POA: Diagnosis not present

## 2014-01-16 DIAGNOSIS — Z90710 Acquired absence of both cervix and uterus: Secondary | ICD-10-CM | POA: Diagnosis not present

## 2014-01-16 DIAGNOSIS — F411 Generalized anxiety disorder: Secondary | ICD-10-CM | POA: Diagnosis not present

## 2014-01-17 ENCOUNTER — Ambulatory Visit
Admission: RE | Admit: 2014-01-17 | Discharge: 2014-01-17 | Disposition: A | Discharge status: Home / Self Care | Payer: Medicare Other | Source: Ambulatory Visit | Attending: Radiation Oncology | Admitting: Radiation Oncology

## 2014-01-17 ENCOUNTER — Encounter: Payer: Self-pay | Admitting: Radiation Oncology

## 2014-01-17 VITALS — BP 111/59 | HR 65 | Temp 98.0°F | Resp 20 | Wt 130.6 lb

## 2014-01-17 DIAGNOSIS — Z8543 Personal history of malignant neoplasm of ovary: Secondary | ICD-10-CM | POA: Diagnosis not present

## 2014-01-17 DIAGNOSIS — Z510 Encounter for antineoplastic radiation therapy: Secondary | ICD-10-CM | POA: Diagnosis not present

## 2014-01-17 DIAGNOSIS — E079 Disorder of thyroid, unspecified: Secondary | ICD-10-CM | POA: Diagnosis not present

## 2014-01-17 DIAGNOSIS — F411 Generalized anxiety disorder: Secondary | ICD-10-CM | POA: Diagnosis not present

## 2014-01-17 DIAGNOSIS — Z90710 Acquired absence of both cervix and uterus: Secondary | ICD-10-CM | POA: Diagnosis not present

## 2014-01-17 DIAGNOSIS — C494 Malignant neoplasm of connective and soft tissue of abdomen: Secondary | ICD-10-CM | POA: Diagnosis not present

## 2014-01-17 DIAGNOSIS — C801 Malignant (primary) neoplasm, unspecified: Secondary | ICD-10-CM

## 2014-01-17 NOTE — Progress Notes (Signed)
Weekly rad txs gastric , 2nd treatment completd, no nausea, diarrhea,stated,appetite still good,  Has radiaplex to start  When skin becomes irritated, or erythema, no pain 10:56 AM

## 2014-01-17 NOTE — Progress Notes (Signed)
  Radiation Oncology         [phone] ________________________________  Name: [name] MRN: [id number]  Date: 01/08/2014  DOB: [birth year]  SIMULATION AND TREATMENT PLANNING NOTE  DIAGNOSIS:  Adenocarcinoma involving the anterior abdominal wall   NARRATIVE:  The patient was brought to the [facility].  Identity was confirmed.  All relevant records and images related to the planned course of therapy were reviewed.   Written consent to proceed with treatment was confirmed which was freely given after reviewing the details related to the planned course of therapy had been reviewed with the patient.  Then, the patient was set-up in a stable reproducible  supine position for radiation therapy.  CT images were obtained.  Surface markings were placed.    Medically necessary complex treatment device(s) for immobilization:  Customized VAC lock bag.   The CT images were loaded into the planning software.  Then the target and avoidance structures were contoured.  Treatment planning then occurred.  The radiation prescription was entered and confirmed.  A total of 4 complex treatment devices were fabricated which relate to the designed radiation treatment fields. Each of these customized fields/ complex treatment devices will be used on a daily basis during the radiation course. I have requested : 3D Simulation  I have requested a DVH of the following structures: Target volume, bowel, spinal cord, kidneys.   PLAN:  The patient will receive 45 Gy in 25 fractions initially.  It is anticipated that the patient will receive a 9 gray dose to the postoperative, target region within the anterior abdominal wall. The anticipated total dose will be 54 gray.  ________________________________   [name], MD, PhD

## 2014-01-17 NOTE — Progress Notes (Signed)
   Department of Radiation Oncology  Phone:  [phone] Fax:        [phone]  Weekly Treatment Note    Name: [name] Date: 01/17/2014 MRN: [id number] DOB: 10/29/1945   Current dose: 3.6 Gy  Current fraction: 2   MEDICATIONS: Current Outpatient Prescriptions  Medication Sig Dispense Refill  . ALPRAZolam (XANAX) 0.5 MG tablet Take 1 tablet (0.5 mg total) by mouth at bedtime as needed for anxiety. Or sleep  30 tablet  1  . Calcium Carb-Cholecalciferol (CALCIUM 600 + D PO) Take 1 tablet by mouth daily.      . Cholecalciferol (EQL VITAMIN D3) 1000 UNITS tablet Take 1,000 Units by mouth daily.      Marland Kitchen docusate sodium 100 MG CAPS Take 100 mg by mouth daily.  10 capsule  0  . estrogens, conjugated, (PREMARIN) 0.625 MG tablet Take 1 tablet (0.625 mg total) by mouth every Monday, Wednesday, and Friday.  30 tablet  6  . [START ON 01/20/2014] hyaluronate sodium (RADIAPLEXRX) GEL Apply 1 application topically once.      Marland Kitchen ibuprofen (ADVIL,MOTRIN) 200 MG tablet Take 200 mg by mouth every 4 (four) hours as needed for fever.       Marland Kitchen ibuprofen (ADVIL,MOTRIN) 600 MG tablet Take 1 tablet (600 mg total) by mouth every 6 (six) hours as needed for fever, headache or moderate pain.  30 tablet  0  . levothyroxine (SYNTHROID, LEVOTHROID) 75 MCG tablet Take 75 mcg by mouth every morning.       . Multiple Vitamins-Minerals (MULTIVITAMIN WITH MINERALS) tablet Take 1 tablet by mouth daily.       . Omega-3 Fatty Acids (FISH OIL) 1200 MG CAPS Take 1,200 mg by mouth daily.        No current facility-administered medications for this encounter.     ALLERGIES: Review of patient's allergies indicates no known allergies.   LABORATORY DATA:  Lab Results  Component Value Date   WBC 7.3 11/07/2013   HGB 10.0* 11/07/2013   HCT 30.5* 11/07/2013   MCV 100.7* 11/07/2013   PLT 208 11/07/2013   Lab Results  Component Value Date   NA 142 11/07/2013   K 4.1 11/07/2013   CL 105 11/07/2013   CO2 28  11/07/2013   Lab Results  Component Value Date   ALT 13 11/01/2013   AST 19 11/01/2013   ALKPHOS 60 11/01/2013   BILITOT <0.2* 11/01/2013     NARRATIVE: [name] was seen today for weekly treatment management. The chart was checked and the patient's films were reviewed. The patient is doing well with treatment at this point. She has not had any difficulties so far and her first week.  PHYSICAL EXAMINATION: weight is 130 lb 9.6 oz (59.24 kg). Her oral temperature is 98 F (36.7 C). Her blood pressure is 111/59 and her pulse is 65. Her respiration is 20.        ASSESSMENT: The patient is doing satisfactorily with treatment.  PLAN: We will continue with the patient's radiation treatment as planned.

## 2014-01-21 ENCOUNTER — Ambulatory Visit
Admission: RE | Admit: 2014-01-21 | Discharge: 2014-01-21 | Disposition: A | Discharge status: Home / Self Care | Payer: Medicare Other | Source: Ambulatory Visit | Attending: Radiation Oncology | Admitting: Radiation Oncology

## 2014-01-21 DIAGNOSIS — C494 Malignant neoplasm of connective and soft tissue of abdomen: Secondary | ICD-10-CM | POA: Diagnosis not present

## 2014-01-21 DIAGNOSIS — F411 Generalized anxiety disorder: Secondary | ICD-10-CM | POA: Diagnosis not present

## 2014-01-21 DIAGNOSIS — E079 Disorder of thyroid, unspecified: Secondary | ICD-10-CM | POA: Diagnosis not present

## 2014-01-21 DIAGNOSIS — Z510 Encounter for antineoplastic radiation therapy: Secondary | ICD-10-CM | POA: Diagnosis not present

## 2014-01-21 DIAGNOSIS — Z90710 Acquired absence of both cervix and uterus: Secondary | ICD-10-CM | POA: Diagnosis not present

## 2014-01-21 DIAGNOSIS — Z8543 Personal history of malignant neoplasm of ovary: Secondary | ICD-10-CM | POA: Diagnosis not present

## 2014-01-22 ENCOUNTER — Ambulatory Visit
Admission: RE | Admit: 2014-01-22 | Discharge: 2014-01-22 | Disposition: A | Discharge status: Home / Self Care | Payer: Medicare Other | Source: Ambulatory Visit | Attending: Radiation Oncology | Admitting: Radiation Oncology

## 2014-01-22 DIAGNOSIS — Z8543 Personal history of malignant neoplasm of ovary: Secondary | ICD-10-CM | POA: Diagnosis not present

## 2014-01-22 DIAGNOSIS — C494 Malignant neoplasm of connective and soft tissue of abdomen: Secondary | ICD-10-CM | POA: Diagnosis not present

## 2014-01-22 DIAGNOSIS — Z90710 Acquired absence of both cervix and uterus: Secondary | ICD-10-CM | POA: Diagnosis not present

## 2014-01-22 DIAGNOSIS — Z510 Encounter for antineoplastic radiation therapy: Secondary | ICD-10-CM | POA: Diagnosis not present

## 2014-01-22 DIAGNOSIS — F411 Generalized anxiety disorder: Secondary | ICD-10-CM | POA: Diagnosis not present

## 2014-01-22 DIAGNOSIS — E079 Disorder of thyroid, unspecified: Secondary | ICD-10-CM | POA: Diagnosis not present

## 2014-01-23 ENCOUNTER — Ambulatory Visit
Admission: RE | Admit: 2014-01-23 | Discharge: 2014-01-23 | Disposition: A | Discharge status: Home / Self Care | Payer: Medicare Other | Source: Ambulatory Visit | Attending: Radiation Oncology | Admitting: Radiation Oncology

## 2014-01-23 ENCOUNTER — Encounter: Payer: Self-pay | Admitting: Radiation Oncology

## 2014-01-23 VITALS — BP 108/58 | HR 80 | Temp 97.7°F | Resp 16 | Wt 130.6 lb

## 2014-01-23 DIAGNOSIS — Z8543 Personal history of malignant neoplasm of ovary: Secondary | ICD-10-CM | POA: Diagnosis not present

## 2014-01-23 DIAGNOSIS — C801 Malignant (primary) neoplasm, unspecified: Secondary | ICD-10-CM

## 2014-01-23 DIAGNOSIS — C494 Malignant neoplasm of connective and soft tissue of abdomen: Secondary | ICD-10-CM | POA: Diagnosis not present

## 2014-01-23 DIAGNOSIS — F411 Generalized anxiety disorder: Secondary | ICD-10-CM | POA: Diagnosis not present

## 2014-01-23 DIAGNOSIS — E079 Disorder of thyroid, unspecified: Secondary | ICD-10-CM | POA: Diagnosis not present

## 2014-01-23 DIAGNOSIS — Z90710 Acquired absence of both cervix and uterus: Secondary | ICD-10-CM | POA: Diagnosis not present

## 2014-01-23 DIAGNOSIS — Z510 Encounter for antineoplastic radiation therapy: Secondary | ICD-10-CM | POA: Diagnosis not present

## 2014-01-23 NOTE — Progress Notes (Signed)
Weekly rad txs abdomen, 5/25  Completd, no skin changes, using radiaplex gel daily, good appetite, no diarrhea, no fatigue, no pain 10:56 AM

## 2014-01-24 ENCOUNTER — Ambulatory Visit
Admission: RE | Admit: 2014-01-24 | Discharge: 2014-01-24 | Disposition: A | Discharge status: Home / Self Care | Payer: Medicare Other | Source: Ambulatory Visit | Attending: Radiation Oncology | Admitting: Radiation Oncology

## 2014-01-24 DIAGNOSIS — F411 Generalized anxiety disorder: Secondary | ICD-10-CM | POA: Diagnosis not present

## 2014-01-24 DIAGNOSIS — Z8543 Personal history of malignant neoplasm of ovary: Secondary | ICD-10-CM | POA: Diagnosis not present

## 2014-01-24 DIAGNOSIS — E079 Disorder of thyroid, unspecified: Secondary | ICD-10-CM | POA: Diagnosis not present

## 2014-01-24 DIAGNOSIS — Z510 Encounter for antineoplastic radiation therapy: Secondary | ICD-10-CM | POA: Diagnosis not present

## 2014-01-24 DIAGNOSIS — Z90710 Acquired absence of both cervix and uterus: Secondary | ICD-10-CM | POA: Diagnosis not present

## 2014-01-24 DIAGNOSIS — C494 Malignant neoplasm of connective and soft tissue of abdomen: Secondary | ICD-10-CM | POA: Diagnosis not present

## 2014-01-27 ENCOUNTER — Ambulatory Visit
Admission: RE | Admit: 2014-01-27 | Discharge: 2014-01-27 | Disposition: A | Discharge status: Home / Self Care | Payer: Medicare Other | Source: Ambulatory Visit | Attending: Radiation Oncology | Admitting: Radiation Oncology

## 2014-01-27 DIAGNOSIS — Z90710 Acquired absence of both cervix and uterus: Secondary | ICD-10-CM | POA: Diagnosis not present

## 2014-01-27 DIAGNOSIS — Z8543 Personal history of malignant neoplasm of ovary: Secondary | ICD-10-CM | POA: Diagnosis not present

## 2014-01-27 DIAGNOSIS — E079 Disorder of thyroid, unspecified: Secondary | ICD-10-CM | POA: Diagnosis not present

## 2014-01-27 DIAGNOSIS — C494 Malignant neoplasm of connective and soft tissue of abdomen: Secondary | ICD-10-CM | POA: Diagnosis not present

## 2014-01-27 DIAGNOSIS — Z510 Encounter for antineoplastic radiation therapy: Secondary | ICD-10-CM | POA: Diagnosis not present

## 2014-01-27 DIAGNOSIS — F411 Generalized anxiety disorder: Secondary | ICD-10-CM | POA: Diagnosis not present

## 2014-01-28 ENCOUNTER — Ambulatory Visit
Admission: RE | Admit: 2014-01-28 | Discharge: 2014-01-28 | Disposition: A | Discharge status: Home / Self Care | Payer: Medicare Other | Source: Ambulatory Visit | Attending: Radiation Oncology | Admitting: Radiation Oncology

## 2014-01-28 DIAGNOSIS — Z8543 Personal history of malignant neoplasm of ovary: Secondary | ICD-10-CM | POA: Diagnosis not present

## 2014-01-28 DIAGNOSIS — F411 Generalized anxiety disorder: Secondary | ICD-10-CM | POA: Diagnosis not present

## 2014-01-28 DIAGNOSIS — E079 Disorder of thyroid, unspecified: Secondary | ICD-10-CM | POA: Diagnosis not present

## 2014-01-28 DIAGNOSIS — Z510 Encounter for antineoplastic radiation therapy: Secondary | ICD-10-CM | POA: Diagnosis not present

## 2014-01-28 DIAGNOSIS — C494 Malignant neoplasm of connective and soft tissue of abdomen: Secondary | ICD-10-CM | POA: Diagnosis not present

## 2014-01-28 DIAGNOSIS — Z90710 Acquired absence of both cervix and uterus: Secondary | ICD-10-CM | POA: Diagnosis not present

## 2014-01-29 ENCOUNTER — Ambulatory Visit
Admission: RE | Admit: 2014-01-29 | Discharge: 2014-01-29 | Disposition: A | Discharge status: Home / Self Care | Payer: Medicare Other | Source: Ambulatory Visit | Attending: Radiation Oncology | Admitting: Radiation Oncology

## 2014-01-29 DIAGNOSIS — C801 Malignant (primary) neoplasm, unspecified: Secondary | ICD-10-CM

## 2014-01-29 DIAGNOSIS — E079 Disorder of thyroid, unspecified: Secondary | ICD-10-CM | POA: Diagnosis not present

## 2014-01-29 DIAGNOSIS — C494 Malignant neoplasm of connective and soft tissue of abdomen: Secondary | ICD-10-CM | POA: Diagnosis not present

## 2014-01-29 DIAGNOSIS — Z8543 Personal history of malignant neoplasm of ovary: Secondary | ICD-10-CM | POA: Diagnosis not present

## 2014-01-29 DIAGNOSIS — Z90710 Acquired absence of both cervix and uterus: Secondary | ICD-10-CM | POA: Diagnosis not present

## 2014-01-29 DIAGNOSIS — Z510 Encounter for antineoplastic radiation therapy: Secondary | ICD-10-CM | POA: Diagnosis not present

## 2014-01-29 DIAGNOSIS — F411 Generalized anxiety disorder: Secondary | ICD-10-CM | POA: Diagnosis not present

## 2014-01-29 NOTE — Progress Notes (Signed)
   Department of Radiation Oncology  Phone:  [phone] Fax:        [phone]  Weekly Treatment Note    Name: [name] Date: 01/29/2014 MRN: [id number] DOB: 09-01-1945   Current dose: 16.2 Gy  Current fraction: 9   MEDICATIONS: Current Outpatient Prescriptions  Medication Sig Dispense Refill  . ALPRAZolam (XANAX) 0.5 MG tablet Take 1 tablet (0.5 mg total) by mouth at bedtime as needed for anxiety. Or sleep  30 tablet  1  . Calcium Carb-Cholecalciferol (CALCIUM 600 + D PO) Take 1 tablet by mouth daily.      . Cholecalciferol (EQL VITAMIN D3) 1000 UNITS tablet Take 1,000 Units by mouth daily.      Marland Kitchen docusate sodium 100 MG CAPS Take 100 mg by mouth daily.  10 capsule  0  . estrogens, conjugated, (PREMARIN) 0.625 MG tablet Take 1 tablet (0.625 mg total) by mouth every Monday, Wednesday, and Friday.  30 tablet  6  . hyaluronate sodium (RADIAPLEXRX) GEL Apply 1 application topically once.      Marland Kitchen ibuprofen (ADVIL,MOTRIN) 200 MG tablet Take 200 mg by mouth every 4 (four) hours as needed for fever.       Marland Kitchen ibuprofen (ADVIL,MOTRIN) 600 MG tablet Take 1 tablet (600 mg total) by mouth every 6 (six) hours as needed for fever, headache or moderate pain.  30 tablet  0  . levothyroxine (SYNTHROID, LEVOTHROID) 75 MCG tablet Take 75 mcg by mouth every morning.       . Multiple Vitamins-Minerals (MULTIVITAMIN WITH MINERALS) tablet Take 1 tablet by mouth daily.       . Omega-3 Fatty Acids (FISH OIL) 1200 MG CAPS Take 1,200 mg by mouth daily.        No current facility-administered medications for this encounter.     ALLERGIES: Review of patient's allergies indicates no known allergies.   LABORATORY DATA:  Lab Results  Component Value Date   WBC 7.3 11/07/2013   HGB 10.0* 11/07/2013   HCT 30.5* 11/07/2013   MCV 100.7* 11/07/2013   PLT 208 11/07/2013   Lab Results  Component Value Date   NA 142 11/07/2013   K 4.1 11/07/2013   CL 105 11/07/2013   CO2 28 11/07/2013   Lab Results    Component Value Date   ALT 13 11/01/2013   AST 19 11/01/2013   ALKPHOS 60 11/01/2013   BILITOT <0.2* 11/01/2013     NARRATIVE: [name] was seen today for weekly treatment management. The chart was checked and the patient's films were reviewed. The patient is doing well this week. No nausea. No fatigue. No diarrhea.  PHYSICAL EXAMINATION: Alert, in no acute distress  ASSESSMENT: The patient is doing satisfactorily with treatment.  PLAN: We will continue with the patient's radiation treatment as planned.

## 2014-01-30 ENCOUNTER — Ambulatory Visit
Admission: RE | Admit: 2014-01-30 | Discharge: 2014-01-30 | Disposition: A | Discharge status: Home / Self Care | Payer: Medicare Other | Source: Ambulatory Visit | Attending: Radiation Oncology | Admitting: Radiation Oncology

## 2014-01-30 DIAGNOSIS — C494 Malignant neoplasm of connective and soft tissue of abdomen: Secondary | ICD-10-CM | POA: Diagnosis not present

## 2014-01-30 DIAGNOSIS — Z510 Encounter for antineoplastic radiation therapy: Secondary | ICD-10-CM | POA: Diagnosis not present

## 2014-01-30 DIAGNOSIS — Z8543 Personal history of malignant neoplasm of ovary: Secondary | ICD-10-CM | POA: Diagnosis not present

## 2014-01-30 DIAGNOSIS — E079 Disorder of thyroid, unspecified: Secondary | ICD-10-CM | POA: Diagnosis not present

## 2014-01-30 DIAGNOSIS — F411 Generalized anxiety disorder: Secondary | ICD-10-CM | POA: Diagnosis not present

## 2014-01-30 DIAGNOSIS — Z90710 Acquired absence of both cervix and uterus: Secondary | ICD-10-CM | POA: Diagnosis not present

## 2014-01-31 ENCOUNTER — Telehealth: Payer: Self-pay | Admitting: *Deleted

## 2014-01-31 ENCOUNTER — Encounter: Payer: Self-pay | Admitting: Radiation Oncology

## 2014-01-31 ENCOUNTER — Ambulatory Visit
Admission: RE | Admit: 2014-01-31 | Discharge: 2014-01-31 | Disposition: A | Discharge status: Home / Self Care | Payer: Medicare Other | Source: Ambulatory Visit | Attending: Radiation Oncology | Admitting: Radiation Oncology

## 2014-01-31 VITALS — BP 112/66 | HR 79 | Temp 97.7°F | Resp 20 | Wt 131.2 lb

## 2014-01-31 DIAGNOSIS — C494 Malignant neoplasm of connective and soft tissue of abdomen: Secondary | ICD-10-CM | POA: Diagnosis not present

## 2014-01-31 DIAGNOSIS — Z90710 Acquired absence of both cervix and uterus: Secondary | ICD-10-CM | POA: Diagnosis not present

## 2014-01-31 DIAGNOSIS — C801 Malignant (primary) neoplasm, unspecified: Secondary | ICD-10-CM

## 2014-01-31 DIAGNOSIS — R1900 Intra-abdominal and pelvic swelling, mass and lump, unspecified site: Secondary | ICD-10-CM

## 2014-01-31 DIAGNOSIS — Z510 Encounter for antineoplastic radiation therapy: Secondary | ICD-10-CM | POA: Diagnosis not present

## 2014-01-31 DIAGNOSIS — E079 Disorder of thyroid, unspecified: Secondary | ICD-10-CM | POA: Diagnosis not present

## 2014-01-31 DIAGNOSIS — F411 Generalized anxiety disorder: Secondary | ICD-10-CM | POA: Diagnosis not present

## 2014-01-31 DIAGNOSIS — Z8543 Personal history of malignant neoplasm of ovary: Secondary | ICD-10-CM | POA: Diagnosis not present

## 2014-01-31 MED ORDER — ESTROGENS CONJUGATED 0.625 MG PO TABS: ORAL_TABLET | ORAL | Status: DC

## 2014-01-31 NOTE — Progress Notes (Signed)
   Department of Radiation Oncology  Phone:  [phone] Fax:        [phone]  Weekly Treatment Note    Name: [name] Date: 01/31/2014 MRN: [id number] DOB: 1946/03/17   Current dose: 19.8 Gy  Current fraction: 11   MEDICATIONS: Current Outpatient Prescriptions  Medication Sig Dispense Refill  . ALPRAZolam (XANAX) 0.5 MG tablet Take 1 tablet (0.5 mg total) by mouth at bedtime as needed for anxiety. Or sleep  30 tablet  1  . Calcium Carb-Cholecalciferol (CALCIUM 600 + D PO) Take 1 tablet by mouth daily.      . Cholecalciferol (EQL VITAMIN D3) 1000 UNITS tablet Take 1,000 Units by mouth daily.      Marland Kitchen docusate sodium 100 MG CAPS Take 100 mg by mouth daily.  10 capsule  0  . hyaluronate sodium (RADIAPLEXRX) GEL Apply 1 application topically once.      Marland Kitchen ibuprofen (ADVIL,MOTRIN) 200 MG tablet Take 200 mg by mouth every 4 (four) hours as needed for fever.       Marland Kitchen ibuprofen (ADVIL,MOTRIN) 600 MG tablet Take 1 tablet (600 mg total) by mouth every 6 (six) hours as needed for fever, headache or moderate pain.  30 tablet  0  . levothyroxine (SYNTHROID, LEVOTHROID) 75 MCG tablet Take 75 mcg by mouth every morning.       . Multiple Vitamins-Minerals (MULTIVITAMIN WITH MINERALS) tablet Take 1 tablet by mouth daily.       . Omega-3 Fatty Acids (FISH OIL) 1200 MG CAPS Take 1,200 mg by mouth daily.       Marland Kitchen estrogens, conjugated, (PREMARIN) 0.625 MG tablet Take as directed  30 tablet  6   No current facility-administered medications for this encounter.     ALLERGIES: Review of patient's allergies indicates no known allergies.   LABORATORY DATA:  Lab Results  Component Value Date   WBC 7.3 11/07/2013   HGB 10.0* 11/07/2013   HCT 30.5* 11/07/2013   MCV 100.7* 11/07/2013   PLT 208 11/07/2013   Lab Results  Component Value Date   NA 142 11/07/2013   K 4.1 11/07/2013   CL 105 11/07/2013   CO2 28 11/07/2013   Lab Results  Component Value Date   ALT 13 11/01/2013   AST 19  11/01/2013   ALKPHOS 60 11/01/2013   BILITOT <0.2* 11/01/2013     NARRATIVE: [name] was seen today for weekly treatment management. The chart was checked and the patient's films were reviewed. The patient is doing very well. No complaints of nausea or diarrhea. Some fatigue.  PHYSICAL EXAMINATION: weight is 131 lb 3.2 oz (59.512 kg). Her oral temperature is 97.7 F (36.5 C). Her blood pressure is 112/66 and her pulse is 79. Her respiration is 20.        ASSESSMENT: The patient is doing satisfactorily with treatment.  PLAN: We will continue with the patient's radiation treatment as planned.

## 2014-01-31 NOTE — Progress Notes (Signed)
Weekly rad txs abdomen  19/25 completed, no skin irritation,using radiaplex bid, having loose stools occasionally  Not every day, appetite good, no nausea, good energy 10:53 AM

## 2014-01-31 NOTE — Telephone Encounter (Signed)
Per NP, Notified pt new rx called into pharmacy.

## 2014-01-31 NOTE — Telephone Encounter (Signed)
Pt here with concern regarding cost of premarin. Pt currently taking TID cost 46$. Previously taking 1 po daily cost 46$.  Pt requested  to have same quantity as previously ordered, take as directed by MD.  Will review with provider.

## 2014-02-03 ENCOUNTER — Ambulatory Visit
Admission: RE | Admit: 2014-02-03 | Discharge: 2014-02-03 | Disposition: A | Discharge status: Home / Self Care | Payer: Medicare Other | Source: Ambulatory Visit | Attending: Radiation Oncology | Admitting: Radiation Oncology

## 2014-02-03 DIAGNOSIS — E079 Disorder of thyroid, unspecified: Secondary | ICD-10-CM | POA: Diagnosis not present

## 2014-02-03 DIAGNOSIS — C494 Malignant neoplasm of connective and soft tissue of abdomen: Secondary | ICD-10-CM | POA: Diagnosis not present

## 2014-02-03 DIAGNOSIS — Z90710 Acquired absence of both cervix and uterus: Secondary | ICD-10-CM | POA: Diagnosis not present

## 2014-02-03 DIAGNOSIS — Z8543 Personal history of malignant neoplasm of ovary: Secondary | ICD-10-CM | POA: Diagnosis not present

## 2014-02-03 DIAGNOSIS — F411 Generalized anxiety disorder: Secondary | ICD-10-CM | POA: Diagnosis not present

## 2014-02-03 DIAGNOSIS — Z510 Encounter for antineoplastic radiation therapy: Secondary | ICD-10-CM | POA: Diagnosis not present

## 2014-02-04 ENCOUNTER — Ambulatory Visit
Admission: RE | Admit: 2014-02-04 | Discharge: 2014-02-04 | Disposition: A | Discharge status: Home / Self Care | Payer: Medicare Other | Source: Ambulatory Visit | Attending: Radiation Oncology | Admitting: Radiation Oncology

## 2014-02-04 DIAGNOSIS — F411 Generalized anxiety disorder: Secondary | ICD-10-CM | POA: Diagnosis not present

## 2014-02-04 DIAGNOSIS — Z8543 Personal history of malignant neoplasm of ovary: Secondary | ICD-10-CM | POA: Diagnosis not present

## 2014-02-04 DIAGNOSIS — Z90710 Acquired absence of both cervix and uterus: Secondary | ICD-10-CM | POA: Diagnosis not present

## 2014-02-04 DIAGNOSIS — C494 Malignant neoplasm of connective and soft tissue of abdomen: Secondary | ICD-10-CM | POA: Diagnosis not present

## 2014-02-04 DIAGNOSIS — E079 Disorder of thyroid, unspecified: Secondary | ICD-10-CM | POA: Diagnosis not present

## 2014-02-04 DIAGNOSIS — Z510 Encounter for antineoplastic radiation therapy: Secondary | ICD-10-CM | POA: Diagnosis not present

## 2014-02-05 ENCOUNTER — Ambulatory Visit
Admission: RE | Admit: 2014-02-05 | Discharge: 2014-02-05 | Disposition: A | Discharge status: Home / Self Care | Payer: Medicare Other | Source: Ambulatory Visit | Attending: Radiation Oncology | Admitting: Radiation Oncology

## 2014-02-05 DIAGNOSIS — Z8543 Personal history of malignant neoplasm of ovary: Secondary | ICD-10-CM | POA: Diagnosis not present

## 2014-02-05 DIAGNOSIS — C494 Malignant neoplasm of connective and soft tissue of abdomen: Secondary | ICD-10-CM | POA: Diagnosis not present

## 2014-02-05 DIAGNOSIS — E079 Disorder of thyroid, unspecified: Secondary | ICD-10-CM | POA: Diagnosis not present

## 2014-02-05 DIAGNOSIS — Z90710 Acquired absence of both cervix and uterus: Secondary | ICD-10-CM | POA: Diagnosis not present

## 2014-02-05 DIAGNOSIS — Z510 Encounter for antineoplastic radiation therapy: Secondary | ICD-10-CM | POA: Diagnosis not present

## 2014-02-05 DIAGNOSIS — F411 Generalized anxiety disorder: Secondary | ICD-10-CM | POA: Diagnosis not present

## 2014-02-06 ENCOUNTER — Ambulatory Visit
Admission: RE | Admit: 2014-02-06 | Discharge: 2014-02-06 | Disposition: A | Discharge status: Home / Self Care | Payer: Medicare Other | Source: Ambulatory Visit | Attending: Radiation Oncology | Admitting: Radiation Oncology

## 2014-02-06 DIAGNOSIS — C494 Malignant neoplasm of connective and soft tissue of abdomen: Secondary | ICD-10-CM | POA: Diagnosis not present

## 2014-02-06 DIAGNOSIS — E079 Disorder of thyroid, unspecified: Secondary | ICD-10-CM | POA: Diagnosis not present

## 2014-02-06 DIAGNOSIS — Z8543 Personal history of malignant neoplasm of ovary: Secondary | ICD-10-CM | POA: Diagnosis not present

## 2014-02-06 DIAGNOSIS — Z90710 Acquired absence of both cervix and uterus: Secondary | ICD-10-CM | POA: Diagnosis not present

## 2014-02-06 DIAGNOSIS — Z510 Encounter for antineoplastic radiation therapy: Secondary | ICD-10-CM | POA: Diagnosis not present

## 2014-02-06 DIAGNOSIS — F411 Generalized anxiety disorder: Secondary | ICD-10-CM | POA: Diagnosis not present

## 2014-02-07 ENCOUNTER — Ambulatory Visit
Admission: RE | Admit: 2014-02-07 | Discharge: 2014-02-07 | Disposition: A | Discharge status: Home / Self Care | Payer: Medicare Other | Source: Ambulatory Visit | Attending: Radiation Oncology | Admitting: Radiation Oncology

## 2014-02-07 ENCOUNTER — Encounter: Payer: Self-pay | Admitting: Radiation Oncology

## 2014-02-07 VITALS — BP 114/53 | HR 75 | Temp 97.8°F | Resp 20 | Wt 132.9 lb

## 2014-02-07 DIAGNOSIS — C801 Malignant (primary) neoplasm, unspecified: Secondary | ICD-10-CM

## 2014-02-07 DIAGNOSIS — Z90710 Acquired absence of both cervix and uterus: Secondary | ICD-10-CM | POA: Diagnosis not present

## 2014-02-07 DIAGNOSIS — C494 Malignant neoplasm of connective and soft tissue of abdomen: Secondary | ICD-10-CM | POA: Diagnosis not present

## 2014-02-07 DIAGNOSIS — Z510 Encounter for antineoplastic radiation therapy: Secondary | ICD-10-CM | POA: Diagnosis not present

## 2014-02-07 DIAGNOSIS — Z8543 Personal history of malignant neoplasm of ovary: Secondary | ICD-10-CM | POA: Diagnosis not present

## 2014-02-07 DIAGNOSIS — F411 Generalized anxiety disorder: Secondary | ICD-10-CM | POA: Diagnosis not present

## 2014-02-07 DIAGNOSIS — E079 Disorder of thyroid, unspecified: Secondary | ICD-10-CM | POA: Diagnosis not present

## 2014-02-07 NOTE — Progress Notes (Addendum)
Weekly rad txs abdomen, no nausea, regular bowel movements, no dysuria, appetite good, energy level good,  8:59 AMskin looks good,intact, using radiplex gel daily

## 2014-02-07 NOTE — Progress Notes (Signed)
   Department of Radiation Oncology  Phone:  [phone] Fax:        [phone]  Weekly Treatment Note    Name: [name] Date: 02/07/2014 MRN: [id number] DOB: [birth year]   Current dose: 28.8 Gy  Current fraction: 16   MEDICATIONS: Current Outpatient Prescriptions  Medication Sig Dispense Refill  . ALPRAZolam (XANAX) 0.5 MG tablet Take 1 tablet (0.5 mg total) by mouth at bedtime as needed for anxiety. Or sleep  30 tablet  1  . Calcium Carb-Cholecalciferol (CALCIUM 600 + D PO) Take 1 tablet by mouth daily.      . Cholecalciferol (EQL VITAMIN D3) 1000 UNITS tablet Take 1,000 Units by mouth daily.      Marland Kitchen docusate sodium 100 MG CAPS Take 100 mg by mouth daily.  10 capsule  0  . estrogens, conjugated, (PREMARIN) 0.625 MG tablet Take as directed  30 tablet  6  . hyaluronate sodium (RADIAPLEXRX) GEL Apply 1 application topically once.      Marland Kitchen ibuprofen (ADVIL,MOTRIN) 200 MG tablet Take 200 mg by mouth every 4 (four) hours as needed for fever.       Marland Kitchen ibuprofen (ADVIL,MOTRIN) 600 MG tablet Take 1 tablet (600 mg total) by mouth every 6 (six) hours as needed for fever, headache or moderate pain.  30 tablet  0  . levothyroxine (SYNTHROID, LEVOTHROID) 75 MCG tablet Take 75 mcg by mouth every morning.       . Multiple Vitamins-Minerals (MULTIVITAMIN WITH MINERALS) tablet Take 1 tablet by mouth daily.       . Omega-3 Fatty Acids (FISH OIL) 1200 MG CAPS Take 1,200 mg by mouth daily.        No current facility-administered medications for this encounter.     ALLERGIES: Review of patient's allergies indicates no known allergies.   LABORATORY DATA:  Lab Results  Component Value Date   WBC 7.3 11/07/2013   HGB 10.0* 11/07/2013   HCT 30.5* 11/07/2013   MCV 100.7* 11/07/2013   PLT 208 11/07/2013   Lab Results  Component Value Date   NA 142 11/07/2013   K 4.1 11/07/2013   CL 105 11/07/2013   CO2 28 11/07/2013   Lab Results  Component Value Date   ALT 13 11/01/2013   AST 19  11/01/2013   ALKPHOS 60 11/01/2013   BILITOT <0.2* 11/01/2013     NARRATIVE: [name] was seen today for weekly treatment management. The chart was checked and the patient's films were reviewed. The patient is doing very well. No nausea. No diarrhea. No complaints. Skin doing well.  PHYSICAL EXAMINATION: weight is 132 lb 14.4 oz (60.283 kg). Her oral temperature is 97.8 F (36.6 C). Her blood pressure is 114/53 and her pulse is 75. Her respiration is 20.        ASSESSMENT: The patient is doing satisfactorily with treatment.  PLAN: We will continue with the patient's radiation treatment as planned.

## 2014-02-10 ENCOUNTER — Ambulatory Visit
Admission: RE | Admit: 2014-02-10 | Discharge: 2014-02-10 | Disposition: A | Discharge status: Home / Self Care | Payer: Medicare Other | Source: Ambulatory Visit | Attending: Radiation Oncology | Admitting: Radiation Oncology

## 2014-02-10 DIAGNOSIS — C494 Malignant neoplasm of connective and soft tissue of abdomen: Secondary | ICD-10-CM | POA: Diagnosis not present

## 2014-02-10 DIAGNOSIS — Z510 Encounter for antineoplastic radiation therapy: Secondary | ICD-10-CM | POA: Diagnosis not present

## 2014-02-10 DIAGNOSIS — E079 Disorder of thyroid, unspecified: Secondary | ICD-10-CM | POA: Diagnosis not present

## 2014-02-10 DIAGNOSIS — Z90710 Acquired absence of both cervix and uterus: Secondary | ICD-10-CM | POA: Diagnosis not present

## 2014-02-10 DIAGNOSIS — F411 Generalized anxiety disorder: Secondary | ICD-10-CM | POA: Diagnosis not present

## 2014-02-10 DIAGNOSIS — Z8543 Personal history of malignant neoplasm of ovary: Secondary | ICD-10-CM | POA: Diagnosis not present

## 2014-02-11 ENCOUNTER — Ambulatory Visit
Admission: RE | Admit: 2014-02-11 | Discharge: 2014-02-11 | Disposition: A | Discharge status: Home / Self Care | Payer: Medicare Other | Source: Ambulatory Visit | Attending: Radiation Oncology | Admitting: Radiation Oncology

## 2014-02-11 DIAGNOSIS — F411 Generalized anxiety disorder: Secondary | ICD-10-CM | POA: Diagnosis not present

## 2014-02-11 DIAGNOSIS — Z8543 Personal history of malignant neoplasm of ovary: Secondary | ICD-10-CM | POA: Diagnosis not present

## 2014-02-11 DIAGNOSIS — Z510 Encounter for antineoplastic radiation therapy: Secondary | ICD-10-CM | POA: Diagnosis not present

## 2014-02-11 DIAGNOSIS — C494 Malignant neoplasm of connective and soft tissue of abdomen: Secondary | ICD-10-CM | POA: Diagnosis not present

## 2014-02-11 DIAGNOSIS — E079 Disorder of thyroid, unspecified: Secondary | ICD-10-CM | POA: Diagnosis not present

## 2014-02-11 DIAGNOSIS — Z90710 Acquired absence of both cervix and uterus: Secondary | ICD-10-CM | POA: Diagnosis not present

## 2014-02-12 ENCOUNTER — Ambulatory Visit
Admission: RE | Admit: 2014-02-12 | Discharge: 2014-02-12 | Disposition: A | Discharge status: Home / Self Care | Payer: Medicare Other | Source: Ambulatory Visit | Attending: Radiation Oncology | Admitting: Radiation Oncology

## 2014-02-12 DIAGNOSIS — E079 Disorder of thyroid, unspecified: Secondary | ICD-10-CM | POA: Diagnosis not present

## 2014-02-12 DIAGNOSIS — C494 Malignant neoplasm of connective and soft tissue of abdomen: Secondary | ICD-10-CM | POA: Diagnosis not present

## 2014-02-12 DIAGNOSIS — F411 Generalized anxiety disorder: Secondary | ICD-10-CM | POA: Diagnosis not present

## 2014-02-12 DIAGNOSIS — Z510 Encounter for antineoplastic radiation therapy: Secondary | ICD-10-CM | POA: Diagnosis not present

## 2014-02-12 DIAGNOSIS — Z90710 Acquired absence of both cervix and uterus: Secondary | ICD-10-CM | POA: Diagnosis not present

## 2014-02-12 DIAGNOSIS — Z8543 Personal history of malignant neoplasm of ovary: Secondary | ICD-10-CM | POA: Diagnosis not present

## 2014-02-13 ENCOUNTER — Ambulatory Visit
Admission: RE | Admit: 2014-02-13 | Discharge: 2014-02-13 | Disposition: A | Discharge status: Home / Self Care | Payer: Medicare Other | Source: Ambulatory Visit | Attending: Radiation Oncology | Admitting: Radiation Oncology

## 2014-02-13 DIAGNOSIS — C494 Malignant neoplasm of connective and soft tissue of abdomen: Secondary | ICD-10-CM | POA: Diagnosis not present

## 2014-02-13 DIAGNOSIS — R509 Fever, unspecified: Secondary | ICD-10-CM | POA: Insufficient documentation

## 2014-02-13 DIAGNOSIS — R51 Headache: Secondary | ICD-10-CM | POA: Insufficient documentation

## 2014-02-13 DIAGNOSIS — Z510 Encounter for antineoplastic radiation therapy: Secondary | ICD-10-CM | POA: Insufficient documentation

## 2014-02-13 DIAGNOSIS — F419 Anxiety disorder, unspecified: Secondary | ICD-10-CM | POA: Diagnosis not present

## 2014-02-14 ENCOUNTER — Ambulatory Visit
Admission: RE | Admit: 2014-02-14 | Discharge: 2014-02-14 | Disposition: A | Discharge status: Home / Self Care | Payer: Medicare Other | Source: Ambulatory Visit | Attending: Radiation Oncology | Admitting: Radiation Oncology

## 2014-02-14 ENCOUNTER — Encounter: Payer: Self-pay | Admitting: Radiation Oncology

## 2014-02-14 VITALS — BP 111/53 | HR 75 | Temp 97.6°F | Resp 20 | Wt 132.7 lb

## 2014-02-14 DIAGNOSIS — Z510 Encounter for antineoplastic radiation therapy: Secondary | ICD-10-CM | POA: Diagnosis not present

## 2014-02-14 DIAGNOSIS — C801 Malignant (primary) neoplasm, unspecified: Secondary | ICD-10-CM

## 2014-02-14 DIAGNOSIS — F419 Anxiety disorder, unspecified: Secondary | ICD-10-CM | POA: Diagnosis not present

## 2014-02-14 DIAGNOSIS — R51 Headache: Secondary | ICD-10-CM | POA: Diagnosis not present

## 2014-02-14 DIAGNOSIS — R509 Fever, unspecified: Secondary | ICD-10-CM | POA: Diagnosis not present

## 2014-02-14 DIAGNOSIS — C494 Malignant neoplasm of connective and soft tissue of abdomen: Secondary | ICD-10-CM | POA: Diagnosis not present

## 2014-02-14 NOTE — Progress Notes (Signed)
   Department of Radiation Oncology  Phone:  [phone] Fax:        [phone]  Weekly Treatment Note    Name: [name] Date: 02/14/2014 MRN: [id number] DOB: 07/30/1945   Current dose: 37.8 Gy  Current fraction: 21   MEDICATIONS: Current Outpatient Prescriptions  Medication Sig Dispense Refill  . ALPRAZolam (XANAX) 0.5 MG tablet Take 1 tablet (0.5 mg total) by mouth at bedtime as needed for anxiety. Or sleep  30 tablet  1  . Calcium Carb-Cholecalciferol (CALCIUM 600 + D PO) Take 1 tablet by mouth daily.      . Cholecalciferol (EQL VITAMIN D3) 1000 UNITS tablet Take 1,000 Units by mouth daily.      Marland Kitchen docusate sodium 100 MG CAPS Take 100 mg by mouth daily.  10 capsule  0  . estrogens, conjugated, (PREMARIN) 0.625 MG tablet Take as directed  30 tablet  6  . hyaluronate sodium (RADIAPLEXRX) GEL Apply 1 application topically once.      Marland Kitchen ibuprofen (ADVIL,MOTRIN) 200 MG tablet Take 200 mg by mouth every 4 (four) hours as needed for fever.       Marland Kitchen ibuprofen (ADVIL,MOTRIN) 600 MG tablet Take 1 tablet (600 mg total) by mouth every 6 (six) hours as needed for fever, headache or moderate pain.  30 tablet  0  . levothyroxine (SYNTHROID, LEVOTHROID) 75 MCG tablet Take 75 mcg by mouth every morning.       . Multiple Vitamins-Minerals (MULTIVITAMIN WITH MINERALS) tablet Take 1 tablet by mouth daily.       . Omega-3 Fatty Acids (FISH OIL) 1200 MG CAPS Take 1,200 mg by mouth daily.        No current facility-administered medications for this encounter.     ALLERGIES: Review of patient's allergies indicates no known allergies.   LABORATORY DATA:  Lab Results  Component Value Date   WBC 7.3 11/07/2013   HGB 10.0* 11/07/2013   HCT 30.5* 11/07/2013   MCV 100.7* 11/07/2013   PLT 208 11/07/2013   Lab Results  Component Value Date   NA 142 11/07/2013   K 4.1 11/07/2013   CL 105 11/07/2013   CO2 28 11/07/2013   Lab Results  Component Value Date   ALT 13 11/01/2013   AST 19  11/01/2013   ALKPHOS 60 11/01/2013   BILITOT <0.2* 11/01/2013     NARRATIVE: [name] was seen today for weekly treatment management. The chart was checked and the patient's films were reviewed. The patient is doing very well. No significant nausea or diarrhea. She has used Imodium occasionally. Energy level is good.  PHYSICAL EXAMINATION: weight is 132 lb 11.2 oz (60.192 kg). Her oral temperature is 97.6 F (36.4 C). Her blood pressure is 111/53 and her pulse is 75. Her respiration is 20.        ASSESSMENT: The patient is doing satisfactorily with treatment.  PLAN: We will continue with the patient's radiation treatment as planned.

## 2014-02-14 NOTE — Progress Notes (Signed)
Weekly rad txs, abdomen, no skin changes, using radiaplex daily, had diarrhea once this week, took imodium ad and resolved, no pain, nausea, appetite good, energy level good 8:57 AM

## 2014-02-17 ENCOUNTER — Ambulatory Visit
Admission: RE | Admit: 2014-02-17 | Discharge: 2014-02-17 | Disposition: A | Discharge status: Home / Self Care | Payer: Medicare Other | Source: Ambulatory Visit | Attending: Radiation Oncology | Admitting: Radiation Oncology

## 2014-02-17 DIAGNOSIS — R51 Headache: Secondary | ICD-10-CM | POA: Diagnosis not present

## 2014-02-17 DIAGNOSIS — F419 Anxiety disorder, unspecified: Secondary | ICD-10-CM | POA: Diagnosis not present

## 2014-02-17 DIAGNOSIS — Z510 Encounter for antineoplastic radiation therapy: Secondary | ICD-10-CM | POA: Diagnosis not present

## 2014-02-17 DIAGNOSIS — R509 Fever, unspecified: Secondary | ICD-10-CM | POA: Diagnosis not present

## 2014-02-17 DIAGNOSIS — C494 Malignant neoplasm of connective and soft tissue of abdomen: Secondary | ICD-10-CM | POA: Diagnosis not present

## 2014-02-18 ENCOUNTER — Ambulatory Visit
Admission: RE | Admit: 2014-02-18 | Discharge: 2014-02-18 | Disposition: A | Discharge status: Home / Self Care | Payer: Medicare Other | Source: Ambulatory Visit | Attending: Radiation Oncology | Admitting: Radiation Oncology

## 2014-02-18 DIAGNOSIS — F419 Anxiety disorder, unspecified: Secondary | ICD-10-CM | POA: Diagnosis not present

## 2014-02-18 DIAGNOSIS — C494 Malignant neoplasm of connective and soft tissue of abdomen: Secondary | ICD-10-CM | POA: Diagnosis not present

## 2014-02-18 DIAGNOSIS — R51 Headache: Secondary | ICD-10-CM | POA: Diagnosis not present

## 2014-02-18 DIAGNOSIS — Z510 Encounter for antineoplastic radiation therapy: Secondary | ICD-10-CM | POA: Diagnosis not present

## 2014-02-18 DIAGNOSIS — R509 Fever, unspecified: Secondary | ICD-10-CM | POA: Diagnosis not present

## 2014-02-19 ENCOUNTER — Ambulatory Visit
Admission: RE | Admit: 2014-02-19 | Discharge: 2014-02-19 | Disposition: A | Discharge status: Home / Self Care | Payer: Medicare Other | Source: Ambulatory Visit | Attending: Radiation Oncology | Admitting: Radiation Oncology

## 2014-02-19 DIAGNOSIS — C494 Malignant neoplasm of connective and soft tissue of abdomen: Secondary | ICD-10-CM | POA: Diagnosis not present

## 2014-02-19 DIAGNOSIS — R509 Fever, unspecified: Secondary | ICD-10-CM | POA: Diagnosis not present

## 2014-02-19 DIAGNOSIS — R51 Headache: Secondary | ICD-10-CM | POA: Diagnosis not present

## 2014-02-19 DIAGNOSIS — F419 Anxiety disorder, unspecified: Secondary | ICD-10-CM | POA: Diagnosis not present

## 2014-02-19 DIAGNOSIS — Z510 Encounter for antineoplastic radiation therapy: Secondary | ICD-10-CM | POA: Diagnosis not present

## 2014-02-20 ENCOUNTER — Ambulatory Visit
Admission: RE | Admit: 2014-02-20 | Discharge: 2014-02-20 | Disposition: A | Discharge status: Home / Self Care | Payer: Medicare Other | Source: Ambulatory Visit | Attending: Radiation Oncology | Admitting: Radiation Oncology

## 2014-02-20 DIAGNOSIS — F419 Anxiety disorder, unspecified: Secondary | ICD-10-CM | POA: Diagnosis not present

## 2014-02-20 DIAGNOSIS — R509 Fever, unspecified: Secondary | ICD-10-CM | POA: Diagnosis not present

## 2014-02-20 DIAGNOSIS — R51 Headache: Secondary | ICD-10-CM | POA: Diagnosis not present

## 2014-02-20 DIAGNOSIS — C494 Malignant neoplasm of connective and soft tissue of abdomen: Secondary | ICD-10-CM | POA: Diagnosis not present

## 2014-02-20 DIAGNOSIS — Z510 Encounter for antineoplastic radiation therapy: Secondary | ICD-10-CM | POA: Diagnosis not present

## 2014-02-21 ENCOUNTER — Encounter: Payer: Self-pay | Admitting: Radiation Oncology

## 2014-02-21 ENCOUNTER — Ambulatory Visit: Admission: RE | Admit: 2014-02-21 | Payer: Medicare Other | Source: Ambulatory Visit | Admitting: Radiation Oncology

## 2014-02-21 ENCOUNTER — Ambulatory Visit
Admission: RE | Admit: 2014-02-21 | Discharge: 2014-02-21 | Disposition: A | Discharge status: Home / Self Care | Payer: Medicare Other | Source: Ambulatory Visit | Attending: Radiation Oncology | Admitting: Radiation Oncology

## 2014-02-21 VITALS — BP 120/67 | HR 84 | Temp 98.6°F | Resp 16 | Wt 129.3 lb

## 2014-02-21 DIAGNOSIS — R509 Fever, unspecified: Secondary | ICD-10-CM | POA: Diagnosis not present

## 2014-02-21 DIAGNOSIS — C801 Malignant (primary) neoplasm, unspecified: Secondary | ICD-10-CM

## 2014-02-21 DIAGNOSIS — Z510 Encounter for antineoplastic radiation therapy: Secondary | ICD-10-CM | POA: Diagnosis not present

## 2014-02-21 DIAGNOSIS — R51 Headache: Secondary | ICD-10-CM | POA: Diagnosis not present

## 2014-02-21 DIAGNOSIS — F419 Anxiety disorder, unspecified: Secondary | ICD-10-CM | POA: Diagnosis not present

## 2014-02-21 DIAGNOSIS — C494 Malignant neoplasm of connective and soft tissue of abdomen: Secondary | ICD-10-CM | POA: Diagnosis not present

## 2014-02-21 NOTE — Progress Notes (Signed)
  Radiation Oncology         (336) [phone] ________________________________  Name: [name] MRN: [id number]  Date: 02/14/2014  DOB: [birth year]  COMPLEX SIMULATION  NOTE  Diagnosis: Adenocarcinoma of the abdominal wall  Narrative The patient has initially been planned to receive a course of radiation treatment to a dose of 45 gray in 25 fractions at 1.8 gray per fraction. The patient will now receive a boost to the high risk target volume for an additional 9 gray. This will be delivered in 5 fractions at 1.8 gray per fraction and a cone down boost technique will be utilized. To accomplish this, an additional 4 customized blocks have been designed for this purpose. A complex isodose plan is requested to ensure that the high-risk target region receives the appropriate radiation dose and that the nearby normal structures continue to be appropriately spared. The patient's final total dose therefore will be 54 gray.   ________________________________ ------------------------------------------------  [name], MD, PhD

## 2014-02-21 NOTE — Progress Notes (Signed)
weekly rad txs abdomen,26/30 completyd, no skin changes on abdomen, using radiaplex daily,  No gas, no nausea,  Appetite good, just stopped snacking,  Energy   evel good, no pain 3:17 PM

## 2014-02-21 NOTE — Progress Notes (Signed)
   Department of Radiation Oncology  Phone:  [phone] Fax:        [phone]  Weekly Treatment Note    Name: [name] Date: 02/21/2014 MRN: [id number] DOB: 11-27-1945   Current dose: 46.8 Gy  Current fraction: 26   MEDICATIONS: Current Outpatient Prescriptions  Medication Sig Dispense Refill  . ALPRAZolam (XANAX) 0.5 MG tablet Take 1 tablet (0.5 mg total) by mouth at bedtime as needed for anxiety. Or sleep  30 tablet  1  . Calcium Carb-Cholecalciferol (CALCIUM 600 + D PO) Take 1 tablet by mouth daily.      . Cholecalciferol (EQL VITAMIN D3) 1000 UNITS tablet Take 1,000 Units by mouth daily.      Marland Kitchen docusate sodium 100 MG CAPS Take 100 mg by mouth daily.  10 capsule  0  . estrogens, conjugated, (PREMARIN) 0.625 MG tablet Take as directed  30 tablet  6  . hyaluronate sodium (RADIAPLEXRX) GEL Apply 1 application topically once.      Marland Kitchen ibuprofen (ADVIL,MOTRIN) 200 MG tablet Take 200 mg by mouth every 4 (four) hours as needed for fever.       Marland Kitchen ibuprofen (ADVIL,MOTRIN) 600 MG tablet Take 1 tablet (600 mg total) by mouth every 6 (six) hours as needed for fever, headache or moderate pain.  30 tablet  0  . levothyroxine (SYNTHROID, LEVOTHROID) 75 MCG tablet Take 75 mcg by mouth every morning.       . Multiple Vitamins-Minerals (MULTIVITAMIN WITH MINERALS) tablet Take 1 tablet by mouth daily.       . Omega-3 Fatty Acids (FISH OIL) 1200 MG CAPS Take 1,200 mg by mouth daily.        No current facility-administered medications for this encounter.     ALLERGIES: Review of patient's allergies indicates no known allergies.   LABORATORY DATA:  Lab Results  Component Value Date   WBC 7.3 11/07/2013   HGB 10.0* 11/07/2013   HCT 30.5* 11/07/2013   MCV 100.7* 11/07/2013   PLT 208 11/07/2013   Lab Results  Component Value Date   NA 142 11/07/2013   K 4.1 11/07/2013   CL 105 11/07/2013   CO2 28 11/07/2013   Lab Results  Component Value Date   ALT 13 11/01/2013   AST 19  11/01/2013   ALKPHOS 60 11/01/2013   BILITOT <0.2* 11/01/2013     NARRATIVE: [name] was seen today for weekly treatment management. The chart was checked and the patient's films were reviewed. The patient is doing quite well. No significant nausea. Appetite good. Energy level.  PHYSICAL EXAMINATION: weight is 129 lb 4.8 oz (58.65 kg). Her oral temperature is 98.6 F (37 C). Her blood pressure is 120/67 and her pulse is 84. Her respiration is 16.        ASSESSMENT: The patient is doing satisfactorily with treatment.  PLAN: We will continue with the patient's radiation treatment as planned.

## 2014-02-24 ENCOUNTER — Ambulatory Visit
Admission: RE | Admit: 2014-02-24 | Discharge: 2014-02-24 | Disposition: A | Discharge status: Home / Self Care | Payer: Medicare Other | Source: Ambulatory Visit | Attending: Radiation Oncology | Admitting: Radiation Oncology

## 2014-02-24 DIAGNOSIS — Z510 Encounter for antineoplastic radiation therapy: Secondary | ICD-10-CM | POA: Diagnosis not present

## 2014-02-24 DIAGNOSIS — F419 Anxiety disorder, unspecified: Secondary | ICD-10-CM | POA: Diagnosis not present

## 2014-02-24 DIAGNOSIS — R51 Headache: Secondary | ICD-10-CM | POA: Diagnosis not present

## 2014-02-24 DIAGNOSIS — R509 Fever, unspecified: Secondary | ICD-10-CM | POA: Diagnosis not present

## 2014-02-24 DIAGNOSIS — C494 Malignant neoplasm of connective and soft tissue of abdomen: Secondary | ICD-10-CM | POA: Diagnosis not present

## 2014-02-25 ENCOUNTER — Ambulatory Visit
Admission: RE | Admit: 2014-02-25 | Discharge: 2014-02-25 | Disposition: A | Discharge status: Home / Self Care | Payer: Medicare Other | Source: Ambulatory Visit | Attending: Radiation Oncology | Admitting: Radiation Oncology

## 2014-02-25 DIAGNOSIS — R51 Headache: Secondary | ICD-10-CM | POA: Diagnosis not present

## 2014-02-25 DIAGNOSIS — C494 Malignant neoplasm of connective and soft tissue of abdomen: Secondary | ICD-10-CM | POA: Diagnosis not present

## 2014-02-25 DIAGNOSIS — R509 Fever, unspecified: Secondary | ICD-10-CM | POA: Diagnosis not present

## 2014-02-25 DIAGNOSIS — F419 Anxiety disorder, unspecified: Secondary | ICD-10-CM | POA: Diagnosis not present

## 2014-02-25 DIAGNOSIS — Z510 Encounter for antineoplastic radiation therapy: Secondary | ICD-10-CM | POA: Diagnosis not present

## 2014-02-26 ENCOUNTER — Ambulatory Visit
Admission: RE | Admit: 2014-02-26 | Discharge: 2014-02-26 | Disposition: A | Discharge status: Home / Self Care | Payer: Medicare Other | Source: Ambulatory Visit | Attending: Radiation Oncology | Admitting: Radiation Oncology

## 2014-02-26 DIAGNOSIS — C494 Malignant neoplasm of connective and soft tissue of abdomen: Secondary | ICD-10-CM | POA: Diagnosis not present

## 2014-02-26 DIAGNOSIS — F419 Anxiety disorder, unspecified: Secondary | ICD-10-CM | POA: Diagnosis not present

## 2014-02-26 DIAGNOSIS — Z510 Encounter for antineoplastic radiation therapy: Secondary | ICD-10-CM | POA: Diagnosis not present

## 2014-02-26 DIAGNOSIS — R51 Headache: Secondary | ICD-10-CM | POA: Diagnosis not present

## 2014-02-26 DIAGNOSIS — R509 Fever, unspecified: Secondary | ICD-10-CM | POA: Diagnosis not present

## 2014-02-27 ENCOUNTER — Ambulatory Visit
Admission: RE | Admit: 2014-02-27 | Discharge: 2014-02-27 | Disposition: A | Discharge status: Home / Self Care | Payer: Medicare Other | Source: Ambulatory Visit | Attending: Radiation Oncology | Admitting: Radiation Oncology

## 2014-02-27 ENCOUNTER — Encounter: Payer: Self-pay | Admitting: Radiation Oncology

## 2014-02-27 DIAGNOSIS — R509 Fever, unspecified: Secondary | ICD-10-CM | POA: Diagnosis not present

## 2014-02-27 DIAGNOSIS — Z510 Encounter for antineoplastic radiation therapy: Secondary | ICD-10-CM | POA: Diagnosis not present

## 2014-02-27 DIAGNOSIS — R51 Headache: Secondary | ICD-10-CM | POA: Diagnosis not present

## 2014-02-27 DIAGNOSIS — C494 Malignant neoplasm of connective and soft tissue of abdomen: Secondary | ICD-10-CM | POA: Diagnosis not present

## 2014-02-27 DIAGNOSIS — F419 Anxiety disorder, unspecified: Secondary | ICD-10-CM | POA: Diagnosis not present

## 2014-02-28 DIAGNOSIS — E785 Hyperlipidemia, unspecified: Secondary | ICD-10-CM | POA: Diagnosis not present

## 2014-02-28 DIAGNOSIS — G2581 Restless legs syndrome: Secondary | ICD-10-CM | POA: Diagnosis not present

## 2014-02-28 DIAGNOSIS — Z6820 Body mass index (BMI) 20.0-20.9, adult: Secondary | ICD-10-CM | POA: Diagnosis not present

## 2014-03-05 DIAGNOSIS — Z8543 Personal history of malignant neoplasm of ovary: Secondary | ICD-10-CM | POA: Diagnosis not present

## 2014-03-05 DIAGNOSIS — C7989 Secondary malignant neoplasm of other specified sites: Secondary | ICD-10-CM | POA: Diagnosis not present

## 2014-03-12 ENCOUNTER — Other Ambulatory Visit: Payer: Self-pay | Admitting: Gynecologic Oncology

## 2014-03-13 ENCOUNTER — Telehealth: Payer: Self-pay | Admitting: *Deleted

## 2014-03-13 NOTE — Telephone Encounter (Signed)
Called pt regarding medicaiton refill request - Diflucan and symptoms related.  Pt last seen aug 2015. Currently under Radiation treatment. LMOVM for pt to call back

## 2014-03-14 DIAGNOSIS — Z23 Encounter for immunization: Secondary | ICD-10-CM | POA: Diagnosis not present

## 2014-03-14 NOTE — Progress Notes (Signed)
  Radiation Oncology         (336) [phone] ________________________________  Name: [name] MRN: [id number]  Date: 01/15/2014  DOB: June 15, 1945  Simulation Verification Note   NARRATIVE: The patient was brought to the treatment unit and placed in the planned treatment position. The clinical setup was verified. Then port films were obtained and uploaded to the radiation oncology medical record software.  The treatment beams were carefully compared against the planned radiation fields. The position, location, and shape of the radiation fields was reviewed. The targeted volume of tissue appears to be appropriately covered by the radiation beams. Based on my personal review, I approved the simulation verification. The patient's treatment will proceed as planned.  ________________________________   [name], MD, PhD

## 2014-03-14 NOTE — Progress Notes (Signed)
  Radiation Oncology         (336) [phone] ________________________________  Name: [name] MRN: [id number]  Date: 02/27/2014  DOB: October 15, 1945  End of Treatment Note  Diagnosis:   Adenocarcinoma involving the anterior abdominal wall     Indication for treatment:  Curative       Radiation treatment dates:   01/16/2014 through 02/27/2014  Site/dose:   The patient was treated initially to a dose of 45 gray to the target region within the abdominal wall postoperative area. She received 45 gray using a 4 field 3-D conformal technique at 1.8 gray per fraction. The patient then received a boost using a cone down 4 field technique for an additional 9 Gray. The final dose was 54 gray.  Narrative: The patient tolerated radiation treatment relatively well.   She did not experience significant difficulty with nausea, diarrhea.  Plan: The patient has completed radiation treatment. The patient will return to radiation oncology clinic for routine followup in one month. I advised the patient to call or return sooner if they have any questions or concerns related to their recovery or treatment. ________________________________  [name], M.D., Ph.D.

## 2014-03-21 DIAGNOSIS — E049 Nontoxic goiter, unspecified: Secondary | ICD-10-CM | POA: Diagnosis not present

## 2014-03-28 ENCOUNTER — Other Ambulatory Visit (HOSPITAL_BASED_OUTPATIENT_CLINIC_OR_DEPARTMENT_OTHER): Payer: Medicare Other

## 2014-03-28 ENCOUNTER — Telehealth: Payer: Self-pay | Admitting: Oncology

## 2014-03-28 ENCOUNTER — Ambulatory Visit (HOSPITAL_BASED_OUTPATIENT_CLINIC_OR_DEPARTMENT_OTHER): Payer: Medicare Other | Admitting: Oncology

## 2014-03-28 VITALS — BP 123/60 | HR 99 | Temp 97.7°F | Resp 18 | Ht 67.0 in | Wt 129.0 lb

## 2014-03-28 DIAGNOSIS — Z8543 Personal history of malignant neoplasm of ovary: Secondary | ICD-10-CM

## 2014-03-28 DIAGNOSIS — C569 Malignant neoplasm of unspecified ovary: Secondary | ICD-10-CM

## 2014-03-28 DIAGNOSIS — C792 Secondary malignant neoplasm of skin: Secondary | ICD-10-CM

## 2014-03-28 DIAGNOSIS — C801 Malignant (primary) neoplasm, unspecified: Secondary | ICD-10-CM | POA: Diagnosis not present

## 2014-03-28 NOTE — Telephone Encounter (Signed)
Lft msg for pt confirming labs/ov per 11/13 POF, mailed updated sch..... KJ

## 2014-03-28 NOTE — Progress Notes (Signed)
  [facility] OFFICE PROGRESS NOTE   Diagnosis: metastatic adenocarcinoma  INTERVAL HISTORY:   [name] completed adjuvant radiation 02/27/2014. She feels well. She is working. No complaint.  Objective:  Vital signs in last 24 hours:  Blood pressure 123/60, pulse 99, temperature 97.7 F (36.5 C), temperature source Oral, resp. rate 18, height 5\' 7"  (1.702 m), weight 129 lb (58.514 kg), SpO2 100 %.    HEENT: neck without mass Lymphatics: no cervical, supra-clavicular, axillary, or inguinal nodes Resp: lungs clear bilaterally Cardio: regular rhythm with premature beats GI: no hepatomegaly, no mass, no apparent ascites, healed low transverse incision Vascular: no leg edema   Lab Results:  CA 125 pending  Medications: I have reviewed the patient's current medications.  Assessment/Plan: 1. 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  Staging CTs of the chest, abdomen, and pelvis with no other site of metastatic disease or a primary tumor.   Elevated CA 125.   Cycle 1 Taxol/carboplatin 08/16/2013.   Cycle 2 Taxol/carboplatin 09/06/2013.   Cycle 3 Taxol/carboplatin 09/27/2013   CT 10/11/2013 with a stable abdominal wall mass.  Status post resection of abdominal wall mass with abdominal wall reconstruction 11/05/2013. Final pathology showed adenocarcinoma. Specimen extensively involved with adenocarcinoma which focally involved the edge of the specimen. Immunostains and Foundation 1 testing were nonspecific for a primary tumor location.  Adjuvant radiation to the abdominal wall 01/16/2014 through 02/27/2014 2. Ovarian cancer in 1988, low-grade adenocarcinoma with areas of "borderline" carcinoma, status post a hysterectomy and bilateral oophorectomy.    Disposition:  [name] appears well. We will follow up on the CA 125 from today. She will return for an office visit and CA 125 in 4 months.  [name], MD  03/28/2014   8:42 AM

## 2014-03-29 LAB — CA 125: CA 125: 9 U/mL (ref <35)

## 2014-03-29 LAB — CA 125(PREVIOUS METHOD): CA 125: 6.9 U/mL (ref 0.0–30.2)

## 2014-04-01 ENCOUNTER — Other Ambulatory Visit: Payer: Self-pay

## 2014-04-01 DIAGNOSIS — Z1231 Encounter for screening mammogram for malignant neoplasm of breast: Secondary | ICD-10-CM

## 2014-04-03 ENCOUNTER — Telehealth: Payer: Self-pay | Admitting: *Deleted

## 2014-04-03 ENCOUNTER — Telehealth: Payer: Self-pay | Admitting: Oncology

## 2014-04-03 NOTE — Telephone Encounter (Signed)
Pt called to r/s labs/ov to another day due to work, pt confirmed updated sch..... Per pt's req mailed sch..... KJ

## 2014-04-03 NOTE — Telephone Encounter (Signed)
Per Dr. Benay Spice; notified pt that ca 125 is normal.  Pt verbalized understanding and expressed appreciation for call

## 2014-04-03 NOTE — Telephone Encounter (Signed)
-----   Message from Ladell Pier, MD sent at 03/30/2014 12:16 PM EST ----- Please call patient, ca125 is normal

## 2014-04-08 ENCOUNTER — Telehealth: Payer: Self-pay | Admitting: *Deleted

## 2014-04-08 NOTE — Telephone Encounter (Signed)
Returned call to patient,she left vm that she felt she may be getting a yeast infection asked if Dr.Moody would write rx for Diflucan, asked where she thought the yeast was at, "I'm having some discharge when I void, no dysuria or pain", MD would need to see her first before writing a rx for this, her appt is 04/18/14 with [place], suggested she call her primary MD if symptoms continue 1:31 PM

## 2014-04-14 ENCOUNTER — Encounter: Payer: Self-pay | Admitting: Radiation Oncology

## 2014-04-17 ENCOUNTER — Encounter: Payer: Self-pay | Admitting: Radiation Oncology

## 2014-04-17 ENCOUNTER — Ambulatory Visit
Admission: RE | Admit: 2014-04-17 | Discharge: 2014-04-17 | Disposition: A | Discharge status: Home / Self Care | Payer: Medicare Other | Source: Ambulatory Visit | Attending: Radiation Oncology | Admitting: Radiation Oncology

## 2014-04-17 VITALS — BP 119/65 | HR 87 | Temp 97.3°F | Resp 16 | Ht 67.0 in | Wt 129.9 lb

## 2014-04-17 DIAGNOSIS — C801 Malignant (primary) neoplasm, unspecified: Secondary | ICD-10-CM

## 2014-04-17 HISTORY — DX: Personal history of irradiation: Z92.3

## 2014-04-17 NOTE — Progress Notes (Signed)
  Radiation Oncology         (336) [phone] ________________________________  Name: [name] MRN: [id number]  Date: 04/17/2014  DOB: [birth year]  Follow-Up Visit Note  CC: Glo Herring., MD  Redmond School, MD  Diagnosis:   Adenocarcinoma involving the anterior abdominal wall  Interval Since Last Radiation:  The patient completed radiation treatment on 02/27/2014   Narrative:  The patient returns today for routine follow-up.  She states that she is doing very well at this time. She has no pain in the abdominal region. No nausea. No diarrhea or other GI issues. Good energy level and good appetite.                              ALLERGIES:  has No Known Allergies.  Meds: Current Outpatient Prescriptions  Medication Sig Dispense Refill  . ALPRAZolam (XANAX) 0.5 MG tablet Take 1 tablet (0.5 mg total) by mouth at bedtime as needed for anxiety. Or sleep 30 tablet 1  . Calcium Carb-Cholecalciferol (CALCIUM 600 + D PO) Take 1 tablet by mouth daily.    . Cholecalciferol (EQL VITAMIN D3) 1000 UNITS tablet Take 1,000 Units by mouth daily.    Marland Kitchen docusate sodium 100 MG CAPS Take 100 mg by mouth daily. 10 capsule 0  . estrogens, conjugated, (PREMARIN) 0.625 MG tablet Take as directed 30 tablet 6  . levothyroxine (SYNTHROID, LEVOTHROID) 75 MCG tablet Take 75 mcg by mouth every morning.     . Multiple Vitamins-Minerals (MULTIVITAMIN WITH MINERALS) tablet Take 1 tablet by mouth daily.     . Omega-3 Fatty Acids (FISH OIL) 1200 MG CAPS Take 1,200 mg by mouth daily.      No current facility-administered medications for this encounter.    Physical Findings: The patient is in no acute distress. Patient is alert and oriented.  height is 5\' 7"  (1.702 m) and weight is 129 lb 14.4 oz (58.922 kg). Her oral temperature is 97.3 F (36.3 C). Her blood pressure is 119/65 and her pulse is 87. Her respiration is 16. .   General: Well-developed, in no acute distress HEENT: Normocephalic, atraumatic; oral  cavity clear Neck: Supple without any lymphadenopathy Cardiovascular: Regular rate and rhythm Respiratory: Clear to auscultation bilaterally GI: Soft, nontender, normal bowel sounds, no pain in the anterior abdominal region, no nodularity/masses Extremities: No edema present Neuro: No focal deficits    Lab Findings: Lab Results  Component Value Date   WBC 7.3 11/07/2013   HGB 10.0* 11/07/2013   HCT 30.5* 11/07/2013   MCV 100.7* 11/07/2013   PLT 208 11/07/2013     Radiographic Findings: No results found.  Impression:    The patient is doing very well clinically. The patient has recovered well from her course of radiation treatment which she tolerated well.  Plan:  She will return to our clinic on a when necessary basis.   [name], M.D., Ph.D.

## 2014-04-17 NOTE — Progress Notes (Signed)
Follow up s/p rad tx stomach 01/16/14-02/27/14, no c/o pain, nausea, or diarrhea, doing well, appetite good, energy level good stated 4:29 PM

## 2014-04-18 ENCOUNTER — Ambulatory Visit
Admission: RE | Admit: 2014-04-18 | Discharge: 2014-04-18 | Disposition: A | Discharge status: Home / Self Care | Payer: Medicare Other | Source: Ambulatory Visit

## 2014-04-18 DIAGNOSIS — Z1231 Encounter for screening mammogram for malignant neoplasm of breast: Secondary | ICD-10-CM

## 2014-05-01 ENCOUNTER — Encounter: Payer: Self-pay | Admitting: Gynecologic Oncology

## 2014-05-01 ENCOUNTER — Ambulatory Visit: Payer: Medicare Other | Attending: Gynecologic Oncology | Admitting: Gynecologic Oncology

## 2014-05-01 VITALS — BP 122/70 | HR 85 | Temp 98.2°F | Resp 16 | Ht 67.0 in | Wt 128.8 lb

## 2014-05-01 DIAGNOSIS — R32 Unspecified urinary incontinence: Secondary | ICD-10-CM | POA: Diagnosis not present

## 2014-05-01 DIAGNOSIS — Z8543 Personal history of malignant neoplasm of ovary: Secondary | ICD-10-CM | POA: Insufficient documentation

## 2014-05-01 DIAGNOSIS — R1900 Intra-abdominal and pelvic swelling, mass and lump, unspecified site: Secondary | ICD-10-CM | POA: Insufficient documentation

## 2014-05-01 NOTE — Progress Notes (Signed)
Consult Note: Gyn-Onc  [name] 68 y.o. female  CC:  Chief Complaint  Patient presents with  . abdominal mass    HPI:  Patient 68 year old gravida 0 who in 1988 underwent a total dominant hysterectomy bilateral salpingo-oophorectomy by Dr. Ree Edman. 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 Her washings were negative. CA 125 at the time of diagnosis was 33. She's been doing well without significant complaints since that time. She's had excellent routine medical care. For the past several months she felt that her stomach "old age" in the shower and felt that she was gaining weight. She scratched her abdomen felt that she felt a mass and had some discomfort and she went to see her primary care physician Dr. Gerarda Fraction. A CT scan of the chest abdomen and pelvis was ordered on 08/06/2013 but revealed:  FINDINGS:  Negative lung bases. No pericardial or pleural effusion. No acute osseous abnormality identified. Advanced L4-L5 disc degeneration. Lobulated soft tissue mass arising just to the right of midline in the lower abdomen and suprapubic region, inseparable from both caudal rectus muscles, more so the right. Approaching the mass the right rectus muscle is expanded (series 2, image 57. The mass is multilobulated and encompasses 55 x 79 x 97 cm.There is a narrow fat plane between the mass and underlying small bowel and bladder within the pelvis. No inguinal or pelvic sidewall lymphadenopathy. No pelvic free fluid. Uterus and adnexa not identified and appear to be surgically absent. Negative distal colon except for retained stool and diverticulosis. Left colon, transverse colon, right colon within normal limits. Appendix not identified. Oral contrast has almost reached the terminal ileum. No dilated small bowel. Fairly decompressed stomach. Negative duodenum. Surgically absent gallbladder. Mild intra and  extrahepatic biliary ductal enlargement likely postoperative. Normal liver enhancement. Spleen, pancreas, and adrenal glands are normal. Portal venous system within normal limits. Major arterial structures in the abdomen and pelvis are patent. Aortoiliac calcified atherosclerosis noted. No abdominal free fluid. No lymphadenopathy. Kidneys within normal limits; small left upper pole low-density area with simple fluid densitometry.  IMPRESSION:  1. Lower abdominal/suprapubic ventral wall lobulated soft tissue mass, arising within the lower rectus musculature more so on the right. 5.5 x 7.9 x 9.7 cm. Favor sarcoma or other connective tissue tumor. This should be amenable to percutaneous biopsy. In a younger female patient, endometriosis within a Cesarean section scar would also be a consideration for this appearance.  2. Narrow fat plane between the mass in #1 and underlying small bowel and bladder. No lymphadenopathy. No ascites.  3. Surgically absent gallbladder, uterus, and adnexa. Diverticulosis of the colon.  She underwent a core biopsy of the lesion on March 17 that revealed: Diagnosis Soft Tissue Needle Core Biopsy, abdominal wall mass - METASTATIC ADENOCARCINOMA INVOLVING SOFT TISSUE, SEE COMMENT. Microscopic Comment Needle core biopsies demonstrate diffuse involvement by well differentiated adenocarcinoma. The adenocarcinoma has the following immunophenotype: Cytokeratin 7 - strong diffuse expression Cytokeratin 20 - negative expression CDX2 - negative expression TTF-1 - negative expression Estrogen receptor - negative expression Vimentin - patchy mild to moderate expression The clinical history of hysterectomy for "possible endometrial cancer" is noted. The morphology and immunophenotype of the current biopsies is consistent with metastatic adenocarcinoma. Given the clinical history, metastasis from a gynecological primary is a consideration. However, metastasis from a primary lung, upper  gastrointestinal tract or pancreatic-biliary tumor is not definitively excluded.  With  a biopsy was not definitive, given her past history as well no other primary site was felt that this very well could be a recurrence of her primary ovarian tumor. Use in the area of her prior transverse skin incision. There is a discussion with Dr. Barry Dienes, Dr. Benay Spice, plastic surgery, and myself it was felt that it would be a fairly radical dissection to proceed with surgery at this time and the decision was made to approach her with neoadjuvant chemotherapy. Her CA 125 was elevated at the time of cycle #1 of chemotherapy at 128. She's another cycle of chemotherapy scheduled for April 24. She's overall tolerating her chemotherapy quite well. She had diarrhea for one day presented by his been feeling well. She is up-to-date on her mammograms. Her colonoscopy 2-3 years ago and was recommended she'll follow up in 5 years. Family history significant for paternal grandmother with breast cancer in her 59s. She has 2 sisters who have had melanoma felt to be due to sun exposure. The patient herself does see a dermatologist.  She's completed her third cycle of paclitaxel and carboplatin on Sep 28 2011. Her CA 125 at that time was 147 which is not significantly changed from her pre-chemotherapy value of 128. She had a CT scan that revealed: FINDINGS:  Lung bases show no acute findings. Heart size normal. No pericardial or pleural effusion. Liver is unremarkable. Cholecystectomy with stable intrahepatic and extrahepatic biliary duct prominence. Adrenal glands and right kidney are unremarkable. Low-attenuation lesions in the left kidney measure up to 1.4 cm, as before and are likely cysts. Kidneys, spleen, pancreas, stomach and bowel are unremarkable. A heterogeneous mass centered in the inferior right rectus abdominus muscle measures 5.3 x 7.5 cm (previously 5.5 x 7.9 cm). No pathologically enlarged lymph nodes. Scattered  atherosclerotic calcification of the arterial vasculature without abdominal aortic aneurysm. No free fluid. No worrisome lytic or sclerotic lesions.  Degenerative changes are seen in the spine.  IMPRESSION:  Right rectus abdominus mass is stable to very minimally smaller than on 07/19/2013.  Surgery 11/05/13: Findings Operative findings: 8 involving the subcutaneous tissues, fascia, and rectus on the right side. No obvious intraperitoneal disease. Normal-appearing omentum    Biopsies of (if applicable) revealed: Diagnosis 11/05/2013:  Soft tissue mass, simple excision, abdominal wall mass  ADENOCARCINOMA.  Microscopic Comment  The specimen is extensively involved by adenocarcinoma which focally involves the edge of the specimen. Immunohistochemistry is performed and the tumor shows patchy positivity with Napsin-A and is negative with WT-1, estrogen receptor, progesterone receptor, gross disease cystic fluid protein, CDX-2, carcinoembryonic antigen, thyroid transcription factor-1, CD10 and CD117. The immunophenotype is nonspecific and additional immunohistochemistry will be performed and reported as an addendum. (JDP:kh 11/07/13) ADDENDUM: Additional immunohistochemistry is performed and the tumor is positive with cytokeratin AE1/AE3 and cytokeratin 7 and is negative with Calretinin, cytokeratin 5/6 and cytokeratin 20. The immunoreactivity is not specific for location of a primary tumor.   She underwent some testing with Foundation 1. 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 She underwent tumor directed radiation therapy which she completed October 15. She saw Dr. Benay Spice on November 13. Her CA-125 at that time was 9. She comes in today for follow-up. She's overall doing quite well and really denies any significant complaints. She does complain of some urinary incontinence primarily related to lasting and coughing. She does wear a mini pad. She has any change in her bowel habits. She is up-to-date on her  mammograms having had one on December 4 that showed  no evidence of malignancy. There are no new medical problems and her family. She scheduled for routine eye exam in January.   Review of Systems  Constitutional: Denies fever. Skin: No rash, sores, jaundice, itching, or dryness.  Cardiovascular: No chest pain, shortness of breath, or edema  Pulmonary: No cough or wheeze.  Gastro Intestinal: No nausea, vomiting, constipation, or diarrhea reported. No bright red blood per rectum or change in bowel movement.  Genitourinary: No frequency, urgency, or dysuria. + SUI. Denies vaginal bleeding and discharge.  Musculoskeletal: No myalgia, arthralgia Neurologic: No weakness, numbness, or change in gait.  Psychology: No complaines   Current Meds:  Outpatient Encounter Prescriptions as of 05/01/2014  Medication Sig  . ALPRAZolam (XANAX) 0.5 MG tablet Take 1 tablet (0.5 mg total) by mouth at bedtime as needed for anxiety. Or sleep  . Calcium Carb-Cholecalciferol (CALCIUM 600 + D PO) Take 1 tablet by mouth daily.  . Cholecalciferol (EQL VITAMIN D3) 1000 UNITS tablet Take 1,000 Units by mouth daily.  Marland Kitchen estrogens, conjugated, (PREMARIN) 0.625 MG tablet Take as directed  . levothyroxine (SYNTHROID, LEVOTHROID) 75 MCG tablet Take 75 mcg by mouth every morning.   . Multiple Vitamins-Minerals (MULTIVITAMIN WITH MINERALS) tablet Take 1 tablet by mouth daily.   . Omega-3 Fatty Acids (FISH OIL) 1200 MG CAPS Take 1,200 mg by mouth daily.   . [DISCONTINUED] docusate sodium 100 MG CAPS Take 100 mg by mouth daily.    Allergy: No Known Allergies  Social Hx:   History   Social History  . Marital Status: Single    Spouse Name: N/A    Number of Children: N/A  . Years of Education: N/A   Occupational History  . Not on file.   Social History Main Topics  . Smoking status: Never Smoker   . Smokeless tobacco: Never Used  . Alcohol Use: No  . Drug Use: No  . Sexual Activity: No     Comment: HYST   Other  Topics Concern  . 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 Oophorectomy      BSO  . Total abdominal hysterectomy  1988    ovarian cyst  BSO  . Cholecystectomy  2009  . Tonsillectomy  as child  . Laparotomy N/A 11/05/2013    Procedure: RESECTION OF PELVIC MASS;  Surgeon: Imagene Gurney A. Alycia Rossetti, MD;  Location: [facility];  Service: Gynecology;  Laterality: N/A;  . Application of a-cell of chest/abdomen N/A 11/05/2013    Procedure: ABDOMINAL WALL RESCONTRUCTION WITH STRATUS;  Surgeon: Irene Limbo, MD;  Location: [facility];  Service: Plastics;  Laterality: N/A;  . Appendectomy      Past Medical Hx:  Past Medical History  Diagnosis Date  . Ovarian cyst   . Thyroid disease   . Hypercholesteremia     under control with diet and fish oil  . GERD (gastroesophageal reflux disease)     hx of years ago  . Restless leg syndrome   . PONV (postoperative nausea and vomiting)   . 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 Anxiety     new dx  . Hx of radiation therapy 01/16/14-02/27/14    abdomen  Oncology Hx:    Adenocarcinoma of abdominal wall, unclear primary   08/09/2013 Initial Diagnosis Adenocarcinoma of abdominal wall, unclear primary   08/16/2013 - 09/25/2013 Chemotherapy paclitaxel and carboplatin   11/05/2013 Surgery resection abdominal wall mass    - 02/27/2014 Radiation Therapy Completed volume directed radiation    Family Hx:  Family History  Problem Relation Age of Onset  . Heart disease Mother   . Heart disease Sister   . Diabetes Sister   . Cancer Sister     melanoma-skin cancer  . Breast cancer Paternal Grandmother     Age 36's  . Cancer Paternal Grandmother     breast  . Cancer Paternal Grandfather      prostate/bladder    Vitals:  Blood pressure 122/70, pulse 85, temperature 98.2 F (36.8 C), temperature source Oral, resp. rate 16, height 5' 7"  (1.702 m), weight 128 lb 12.8 oz (58.423 kg).  Physical Exam: Well-nourished well-developed female who appears younger than stated age in no acute distress.   Neck: Supple, no lymphadenopathy, no thyromegaly.  Lungs: Clear to auscultation bilateral.  Cardiovascular: Regular rate and rhythm.  Abdomen: Well-healed transverse Pfannenstiel skin incision. There is no fluid wave is no other palpable masses. There is no hepatomegaly.  Groins: No lymphadenopathy.  Extremities: No edema.  Pelvic: Normal female genitalia. Speculum examination was performed. There are no gross visible lesions. Exam was poorly tolerated by the patient. Unit digital pelvic examination reveals no masses or nodularity. Rectal confirms.  Assessment/Plan: 68 year old with a history of adenocarcinoma within the left ovary found incidentally at the time of a total abdominal hysterectomy bilateral salpingo-oophorectomy. This was in 1988. She did not require any additional treatment. I do not have a final pathology report. This was an incidental note made in her discharge summary. We treated this as an ovarian cancer recurrence with a neoadjuvant approach.  She did not have a significant improvement in the size of the mass nor in her tumor markers with adjuvant chemotherapy. Therefore, she underwent surgical resection. She there was a positive margin and she underwent volume directed radiation. She is doing very well. She is very pleased with the results of her see one 25. She has a follow-up appointment with Dr. Benay Spice in March and will return to see me in June.  With regards to her urinary incontinence, I believe that the small pad that she is wearing may be irritating urethra and causing an increase in her symptoms. She will try to change the type of mini pad that she is wearing  to see if this helps with her symptoms. If it does not, she doesn't happy to either speak or see her to address this issue prior to her next appointment.   Danielle Shafer A., MD 05/01/2014, 12:03 PM

## 2014-05-01 NOTE — Patient Instructions (Signed)
Follow-up with Dr. Alycia Rossetti in June 2016. This will  be roughly 3 months after your visit with Dr. Benay Spice.

## 2014-05-13 DIAGNOSIS — J069 Acute upper respiratory infection, unspecified: Secondary | ICD-10-CM | POA: Diagnosis not present

## 2014-05-13 DIAGNOSIS — Z6820 Body mass index (BMI) 20.0-20.9, adult: Secondary | ICD-10-CM | POA: Diagnosis not present

## 2014-05-13 DIAGNOSIS — J209 Acute bronchitis, unspecified: Secondary | ICD-10-CM | POA: Diagnosis not present

## 2014-06-20 DIAGNOSIS — E049 Nontoxic goiter, unspecified: Secondary | ICD-10-CM | POA: Diagnosis not present

## 2014-06-20 DIAGNOSIS — E039 Hypothyroidism, unspecified: Secondary | ICD-10-CM | POA: Diagnosis not present

## 2014-06-27 DIAGNOSIS — H25099 Other age-related incipient cataract, unspecified eye: Secondary | ICD-10-CM | POA: Diagnosis not present

## 2014-07-28 ENCOUNTER — Other Ambulatory Visit: Payer: Medicare Other

## 2014-07-28 ENCOUNTER — Ambulatory Visit: Payer: Medicare Other | Admitting: Oncology

## 2014-07-31 ENCOUNTER — Ambulatory Visit (HOSPITAL_BASED_OUTPATIENT_CLINIC_OR_DEPARTMENT_OTHER): Payer: Medicare Other | Admitting: Oncology

## 2014-07-31 ENCOUNTER — Other Ambulatory Visit (HOSPITAL_BASED_OUTPATIENT_CLINIC_OR_DEPARTMENT_OTHER): Payer: Medicare Other

## 2014-07-31 ENCOUNTER — Telehealth: Payer: Self-pay | Admitting: Oncology

## 2014-07-31 VITALS — BP 124/60 | HR 85 | Temp 97.6°F | Resp 18 | Ht 67.0 in | Wt 129.2 lb

## 2014-07-31 DIAGNOSIS — Z8543 Personal history of malignant neoplasm of ovary: Secondary | ICD-10-CM | POA: Diagnosis not present

## 2014-07-31 DIAGNOSIS — C801 Malignant (primary) neoplasm, unspecified: Secondary | ICD-10-CM

## 2014-07-31 DIAGNOSIS — C792 Secondary malignant neoplasm of skin: Secondary | ICD-10-CM

## 2014-07-31 DIAGNOSIS — C569 Malignant neoplasm of unspecified ovary: Secondary | ICD-10-CM | POA: Diagnosis not present

## 2014-07-31 NOTE — Telephone Encounter (Signed)
Pt confirmed labs/ov per 03/17 POF, gave pt AVS and calendar..... KJ

## 2014-07-31 NOTE — Progress Notes (Signed)
  [place] OFFICE PROGRESS NOTE   Diagnosis: Metastatic adenocarcinoma  INTERVAL HISTORY:   [name] returns as scheduled. She feels well. No recurrent abdominal mass. She is working.  Objective:  Vital signs in last 24 hours:  Blood pressure 124/60, pulse 85, temperature 97.6 F (36.4 C), temperature source Oral, resp. rate 18, height 5\' 7"  (1.702 m), weight 129 lb 3.2 oz (58.605 kg), SpO2 100 %.    HEENT: Neck without mass Lymphatics: No cervical, supra-clavicular, axillary, or inguinal nodes Resp: Lungs clear bilaterally Cardio: Regular rate and rhythm GI: No hepatomegaly, no apparent ascites, no mass Vascular: No leg edema  Lab Results:  CA-125 pending    Medications: I have reviewed the patient's current medications.  Assessment/Plan: 1. 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  Staging CTs of the chest, abdomen, and pelvis with no other site of metastatic disease or a primary tumor.   Elevated CA 125.   Cycle 1 Taxol/carboplatin 08/16/2013.   Cycle 2 Taxol/carboplatin 09/06/2013.   Cycle 3 Taxol/carboplatin 09/27/2013   CT 10/11/2013 with a stable abdominal wall mass.  Status post resection of abdominal wall mass with abdominal wall reconstruction 11/05/2013. Final pathology showed adenocarcinoma. Specimen extensively involved with adenocarcinoma which focally involved the edge of the specimen. Immunostains and Foundation 1 testing were nonspecific for a primary tumor location.  Adjuvant radiation to the abdominal wall 01/16/2014 through 02/27/2014 2. Ovarian cancer in 1988, low-grade adenocarcinoma with areas of "borderline" carcinoma, status post a hysterectomy and bilateral oophorectomy.  Disposition:  [name] appears well. There is no clinical evidence for progression of the metastatic adenocarcinoma. We will follow-up on the CA 125 from today. She will return for an office visit in 6 months. She is  scheduled to see GYN oncology in the interim.  [name], MD  07/31/2014  8:58 AM

## 2014-08-01 ENCOUNTER — Telehealth: Payer: Self-pay | Admitting: *Deleted

## 2014-08-01 LAB — CA 125: CA 125: 8 U/mL (ref <35)

## 2014-08-01 NOTE — Telephone Encounter (Signed)
-----   Message from Ladell Pier, MD sent at 08/01/2014  7:43 AM EDT ----- Please call patient on 3/21, ca125 is normal

## 2014-08-04 ENCOUNTER — Telehealth: Payer: Self-pay | Admitting: *Deleted

## 2014-08-04 NOTE — Telephone Encounter (Signed)
NOTIFIED PT. THAT CA125 WAS NORMAL AT 8. SHE VOICES UNDERSTANDING.

## 2014-08-05 ENCOUNTER — Encounter: Payer: Self-pay | Admitting: *Deleted

## 2014-09-19 DIAGNOSIS — E049 Nontoxic goiter, unspecified: Secondary | ICD-10-CM | POA: Diagnosis not present

## 2014-09-24 DIAGNOSIS — E049 Nontoxic goiter, unspecified: Secondary | ICD-10-CM | POA: Diagnosis not present

## 2014-10-14 ENCOUNTER — Ambulatory Visit: Payer: Medicare Other | Admitting: Gynecologic Oncology

## 2014-10-16 ENCOUNTER — Ambulatory Visit: Payer: Medicare Other | Admitting: Gynecologic Oncology

## 2014-10-24 DIAGNOSIS — Z681 Body mass index (BMI) 19 or less, adult: Secondary | ICD-10-CM | POA: Diagnosis not present

## 2014-10-24 DIAGNOSIS — N029 Recurrent and persistent hematuria with unspecified morphologic changes: Secondary | ICD-10-CM | POA: Diagnosis not present

## 2014-10-24 DIAGNOSIS — R3919 Other difficulties with micturition: Secondary | ICD-10-CM | POA: Diagnosis not present

## 2014-10-24 DIAGNOSIS — N342 Other urethritis: Secondary | ICD-10-CM | POA: Diagnosis not present

## 2014-10-28 ENCOUNTER — Telehealth: Payer: Self-pay | Admitting: *Deleted

## 2014-10-28 NOTE — Telephone Encounter (Signed)
Called [facility] for prior Auth of Premarin, rep advised this will need to be faxed in, information put into web portal or a rep will call back in a few days for prior auth over the phone. Paperwork cpmpleted and faxed to [phone]

## 2014-11-19 ENCOUNTER — Ambulatory Visit: Payer: Medicare Other | Attending: Gynecologic Oncology | Admitting: Gynecologic Oncology

## 2014-11-19 ENCOUNTER — Other Ambulatory Visit (HOSPITAL_BASED_OUTPATIENT_CLINIC_OR_DEPARTMENT_OTHER): Payer: Medicare Other

## 2014-11-19 ENCOUNTER — Encounter: Payer: Self-pay | Admitting: Gynecologic Oncology

## 2014-11-19 VITALS — BP 124/68 | HR 78 | Temp 99.3°F | Resp 18 | Ht 67.0 in | Wt 127.2 lb

## 2014-11-19 DIAGNOSIS — C569 Malignant neoplasm of unspecified ovary: Secondary | ICD-10-CM | POA: Diagnosis not present

## 2014-11-19 DIAGNOSIS — C562 Malignant neoplasm of left ovary: Secondary | ICD-10-CM | POA: Insufficient documentation

## 2014-11-19 NOTE — Progress Notes (Signed)
Consult Note: Gyn-Onc  [name] 69 y.o. female  CC:  Chief Complaint  Patient presents with  . Ovarian Cancer    followup    HPI:  Patient 69 year old gravida 0 who in 1988 underwent a total dominant hysterectomy bilateral salpingo-oophorectomy by Dr. Ree Edman. 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 Her washings were negative. CA 125 at the time of diagnosis was 33. She's been doing well without significant complaints since that time. She's had excellent routine medical care. For the past several months she felt that her stomach "old age" in the shower and felt that she was gaining weight. She scratched her abdomen felt that she felt a mass and had some discomfort and she went to see her primary care physician Dr. Gerarda Fraction. A CT scan of the chest abdomen and pelvis was ordered on 08/06/2013 but revealed:  FINDINGS:  Negative lung bases. No pericardial or pleural effusion. No acute osseous abnormality identified. Advanced L4-L5 disc degeneration. Lobulated soft tissue mass arising just to the right of midline in the lower abdomen and suprapubic region, inseparable from both caudal rectus muscles, more so the right. Approaching the mass the right rectus muscle is expanded (series 2, image 57. The mass is multilobulated and encompasses 55 x 79 x 97 cm.There is a narrow fat plane between the mass and underlying small bowel and bladder within the pelvis. No inguinal or pelvic sidewall lymphadenopathy. No pelvic free fluid. Uterus and adnexa not identified and appear to be surgically absent. Negative distal colon except for retained stool and diverticulosis. Left colon, transverse colon, right colon within normal limits. Appendix not identified. Oral contrast has almost reached the terminal ileum. No dilated small bowel. Fairly decompressed stomach. Negative duodenum. Surgically absent gallbladder. Mild  intra and extrahepatic biliary ductal enlargement likely postoperative. Normal liver enhancement. Spleen, pancreas, and adrenal glands are normal. Portal venous system within normal limits. Major arterial structures in the abdomen and pelvis are patent. Aortoiliac calcified atherosclerosis noted. No abdominal free fluid. No lymphadenopathy. Kidneys within normal limits; small left upper pole low-density area with simple fluid densitometry.  IMPRESSION:  1. Lower abdominal/suprapubic ventral wall lobulated soft tissue mass, arising within the lower rectus musculature more so on the right. 5.5 x 7.9 x 9.7 cm. Favor sarcoma or other connective tissue tumor. This should be amenable to percutaneous biopsy. In a younger female patient, endometriosis within a Cesarean section scar would also be a consideration for this appearance.  2. Narrow fat plane between the mass in #1 and underlying small bowel and bladder. No lymphadenopathy. No ascites.  3. Surgically absent gallbladder, uterus, and adnexa. Diverticulosis of the colon.  She underwent a core biopsy of the lesion on March 17 that revealed: Diagnosis Soft Tissue Needle Core Biopsy, abdominal wall mass - METASTATIC ADENOCARCINOMA INVOLVING SOFT TISSUE, SEE COMMENT. Microscopic Comment Needle core biopsies demonstrate diffuse involvement by well differentiated adenocarcinoma. The adenocarcinoma has the following immunophenotype: Cytokeratin 7 - strong diffuse expression Cytokeratin 20 - negative expression CDX2 - negative expression TTF-1 - negative expression Estrogen receptor - negative expression Vimentin - patchy mild to moderate expression The clinical history of hysterectomy for "possible endometrial cancer" is noted. The morphology and immunophenotype of the current biopsies is consistent with metastatic adenocarcinoma. Given the clinical history, metastasis from a gynecological primary is a consideration. However, metastasis from a primary  lung, upper gastrointestinal tract or pancreatic-biliary tumor is not  definitively excluded.  With a biopsy was not definitive, given her past history as well no other primary site was felt that this very well could be a recurrence of her primary ovarian tumor. Use in the area of her prior transverse skin incision. There is a discussion with Dr. Barry Dienes, Dr. Benay Spice, plastic surgery, and myself it was felt that it would be a fairly radical dissection to proceed with surgery at this time and the decision was made to approach her with neoadjuvant chemotherapy. Her CA 125 was elevated at the time of cycle #1 of chemotherapy at 128. She's another cycle of chemotherapy scheduled for April 24. She's overall tolerating her chemotherapy quite well. She had diarrhea for one day presented by his been feeling well. She is up-to-date on her mammograms. Her colonoscopy 2-3 years ago and was recommended she'll follow up in 5 years. Family history significant for paternal grandmother with breast cancer in her 32s. She has 2 sisters who have had melanoma felt to be due to sun exposure. The patient herself does see a dermatologist.  She's completed her third cycle of paclitaxel and carboplatin on Sep 28 2011. Her CA 125 at that time was 147 which is not significantly changed from her pre-chemotherapy value of 128. She had a CT scan that revealed: FINDINGS:  Lung bases show no acute findings. Heart size normal. No pericardial or pleural effusion. Liver is unremarkable. Cholecystectomy with stable intrahepatic and extrahepatic biliary duct prominence. Adrenal glands and right kidney are unremarkable. Low-attenuation lesions in the left kidney measure up to 1.4 cm, as before and are likely cysts. Kidneys, spleen, pancreas, stomach and bowel are unremarkable. A heterogeneous mass centered in the inferior right rectus abdominus muscle measures 5.3 x 7.5 cm (previously 5.5 x 7.9 cm). No pathologically enlarged lymph nodes. Scattered  atherosclerotic calcification of the arterial vasculature without abdominal aortic aneurysm. No free fluid. No worrisome lytic or sclerotic lesions.  Degenerative changes are seen in the spine.  IMPRESSION:  Right rectus abdominus mass is stable to very minimally smaller than on 07/19/2013.  Surgery 11/05/13: Findings Operative findings: 8 involving the subcutaneous tissues, fascia, and rectus on the right side. No obvious intraperitoneal disease. Normal-appearing omentum    Biopsies of (if applicable) revealed: Diagnosis 11/05/2013:  Soft tissue mass, simple excision, abdominal wall mass  ADENOCARCINOMA.  Microscopic Comment  The specimen is extensively involved by adenocarcinoma which focally involves the edge of the specimen. Immunohistochemistry is performed and the tumor shows patchy positivity with Napsin-A and is negative with WT-1, estrogen receptor, progesterone receptor, gross disease cystic fluid protein, CDX-2, carcinoembryonic antigen, thyroid transcription factor-1, CD10 and CD117. The immunophenotype is nonspecific and additional immunohistochemistry will be performed and reported as an addendum. (JDP:kh 11/07/13) ADDENDUM: Additional immunohistochemistry is performed and the tumor is positive with cytokeratin AE1/AE3 and cytokeratin 7 and is negative with Calretinin, cytokeratin 5/6 and cytokeratin 20. The immunoreactivity is not specific for location of a primary tumor.   She underwent some testing with [facility] 1. 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 She underwent tumor directed radiation therapy which she completed October 15. She saw Dr. Benay Spice in  March Her CA-125 at that time was 8. She comes in today for follow-up. She's overall doing quite well and really denies any significant complaints. She does complain of some urinary incontinence primarily related to laughing and coughing. She does wear a mini pad. She has any change in her bowel habits. She is up-to-date on her  mammograms having had one on  December 2015 that showed no evidence of malignancy. There are no new medical problems and her family.   23 months ago she did have an episode of fleeting pain in the right lower quadrant. She also urinary tract infection that was treated with ciprofloxacin. She was having some back pain remember to prior urinary tract infection the past that presented the same way. She's not had another one nor is a recurrent issue. She has lost 2 pounds since August of last year. We discussed this. She states she feels that she's eating well she's been drinking as many Cokes as she used to and wonders if this might be contributing. She otherwise is feeling well   Review of Systems  Constitutional: Denies fever. Skin: No rash, sores, jaundice, itching, or dryness.  Cardiovascular: No chest pain, shortness of breath, or edema  Pulmonary: No cough or wheeze.  Gastro Intestinal: No nausea, vomiting, constipation, or diarrhea reported. No bright red blood per rectum or change in bowel movement.  Genitourinary: No frequency, urgency, or dysuria. + SUI. Denies vaginal bleeding and discharge.  Musculoskeletal: No myalgia, arthralgia Neurologic: No weakness, numbness, or change in gait.  Psychology: No complaines   Current Meds:  Outpatient Encounter Prescriptions as of 11/19/2014  Medication Sig  . ALPRAZolam (XANAX) 0.25 MG tablet Take 0.25 mg by mouth at bedtime as needed.   . Biotin 5000 MCG CAPS Take 1 capsule by mouth daily.   . Calcium Carb-Cholecalciferol (CALCIUM 600 + D PO) Take 1 tablet by mouth daily.  . Cholecalciferol (EQL VITAMIN D3) 1000 UNITS tablet Take 1,000 Units by mouth daily.  Marland Kitchen estrogens, conjugated, (PREMARIN) 0.625 MG tablet Take as directed (Patient taking differently: Take 0.625 mg by mouth 2 (two) times a week. Take as directed)  . levothyroxine (SYNTHROID, LEVOTHROID) 75 MCG tablet Take 75 mcg by mouth every morning.   . Multiple Vitamins-Minerals  (MULTIVITAMIN WITH MINERALS) tablet Take 1 tablet by mouth daily.   . Omega-3 Fatty Acids (FISH OIL) 1200 MG CAPS Take 1,200 mg by mouth daily.   . [DISCONTINUED] ALPRAZolam (XANAX) 0.5 MG tablet Take 1 tablet (0.5 mg total) by mouth at bedtime as needed for anxiety. Or sleep   No facility-administered encounter medications on file as of 11/19/2014.    Allergy: No Known Allergies  Social Hx:   History   Social History  . Marital Status: Single    Spouse Name: N/A  . Number of Children: N/A  . Years of Education: N/A   Occupational History  . Not on file.   Social History Main Topics  . Smoking status: Never Smoker   . Smokeless tobacco: Never Used  . Alcohol Use: No  . Drug Use: No  . Sexual Activity: No     Comment: HYST   Other Topics Concern  . 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 Oophorectomy      BSO  . Total abdominal hysterectomy  1988    ovarian cyst  BSO  . Cholecystectomy  2009  . Tonsillectomy  as child  . Laparotomy N/A 11/05/2013    Procedure: RESECTION OF PELVIC MASS;  Surgeon: Imagene Gurney A. Alycia Rossetti, MD;  Location: [facility];  Service: Gynecology;  Laterality: N/A;  . Application of a-cell of chest/abdomen N/A 11/05/2013    Procedure: ABDOMINAL WALL RESCONTRUCTION WITH STRATUS;  Surgeon: Irene Limbo, MD;  Location: [facility];  Service: Plastics;  Laterality: N/A;  . Appendectomy      Past Medical Hx:  Past Medical History  Diagnosis Date  . Ovarian cyst   . Thyroid disease   . Hypercholesteremia     under control with diet and fish oil  . GERD (gastroesophageal reflux disease)     hx of years ago  . Restless leg syndrome   . PONV (postoperative nausea and vomiting)   . Cancer 1988, 2015     ovarian, adenocarcinoma  . Anxiety     new dx  . Hx of radiation therapy 01/16/14-02/27/14    abdomen     Oncology Hx:    Adenocarcinoma of abdominal wall, unclear primary   08/09/2013 Initial Diagnosis Adenocarcinoma of abdominal wall, unclear primary   08/16/2013 - 09/25/2013 Chemotherapy paclitaxel and carboplatin   11/05/2013 Surgery resection abdominal wall mass    - 02/27/2014 Radiation Therapy Completed volume directed radiation    Family Hx:  Family History  Problem Relation Age of Onset  . Heart disease Mother   . Heart disease Sister   . Diabetes Sister   . Cancer Sister     melanoma-skin cancer  . Breast cancer Paternal Grandmother     Age 48's  . Cancer Paternal Grandmother     breast  . Cancer Paternal Grandfather     prostate/bladder    Vitals:  Blood pressure 124/68, pulse 78, temperature 99.3 F (37.4 C), temperature source Oral, resp. rate 18, height _0  (1.702 m), weight 127 lb 3.2 oz (57.698 kg), SpO2 100 %.  Physical Exam: Well-nourished well-developed female who appears younger than stated age in no acute distress.   Neck: Supple, no lymphadenopathy, no thyromegaly.  Lungs: Clear to auscultation bilateral.  Cardiovascular: Regular rate and rhythm.  Abdomen: Well-healed transverse Pfannenstiel skin incision. There is no fluid wave is no other palpable masses. There is no hepatomegaly.  Groins: No lymphadenopathy.  Extremities: No edema.  Pelvic: Normal female genitalia. Unidigital pelvic examination reveals no masses or nodularity. Rectal confirms.  Assessment/Plan: 69 year old with a history of adenocarcinoma within the left ovary found incidentally at the time of a total abdominal hysterectomy bilateral salpingo-oophorectomy. This was in 1988. She did not require any additional treatment. I do not have a final pathology report. This was an incidental note made in her discharge summary. We treated this as an ovarian cancer recurrence with a  neoadjuvant approach.  She did not have a significant improvement in the size of the mass nor in her tumor markers with adjuvant chemotherapy. Therefore, she underwent surgical resection. She there was a positive margin and she underwent volume directed radiation. She is doing very well. She is very pleased with the results of her CA-125. She will have a follow-up appointment with Dr. Benay Spice in 3 months and will return to see me 6 months. We will follow-up in results of her CA-125 from today.    Taneil Lazarus A., MD 11/19/2014, 11:55 AM

## 2014-11-19 NOTE — Patient Instructions (Signed)
Follow-up with Dr. Benay Spice in 3 months and return to see Dr. Alycia Rossetti again in 6 months. We will notify you of your results from today

## 2014-11-20 LAB — CA 125: CA 125: 9 U/mL (ref <35)

## 2014-11-21 ENCOUNTER — Telehealth: Payer: Self-pay | Admitting: *Deleted

## 2014-11-21 NOTE — Telephone Encounter (Signed)
Notified pt CA 125 results were normal. Pt verbalized understanding no further concerns

## 2014-12-19 DIAGNOSIS — E040 Nontoxic diffuse goiter: Secondary | ICD-10-CM | POA: Diagnosis not present

## 2015-01-12 ENCOUNTER — Observation Stay (HOSPITAL_COMMUNITY)
Admission: EM | Admit: 2015-01-12 | Discharge: 2015-01-14 | Disposition: A | Discharge status: Home / Self Care | Payer: Medicare Other | Attending: Family Medicine | Admitting: Family Medicine

## 2015-01-12 ENCOUNTER — Encounter (HOSPITAL_COMMUNITY): Payer: Self-pay | Admitting: Emergency Medicine

## 2015-01-12 DIAGNOSIS — E079 Disorder of thyroid, unspecified: Secondary | ICD-10-CM | POA: Insufficient documentation

## 2015-01-12 DIAGNOSIS — R079 Chest pain, unspecified: Principal | ICD-10-CM | POA: Insufficient documentation

## 2015-01-12 DIAGNOSIS — E780 Pure hypercholesterolemia: Secondary | ICD-10-CM | POA: Insufficient documentation

## 2015-01-12 DIAGNOSIS — G2581 Restless legs syndrome: Secondary | ICD-10-CM | POA: Diagnosis not present

## 2015-01-12 DIAGNOSIS — N8320 Unspecified ovarian cysts: Secondary | ICD-10-CM | POA: Insufficient documentation

## 2015-01-12 DIAGNOSIS — Z79899 Other long term (current) drug therapy: Secondary | ICD-10-CM | POA: Insufficient documentation

## 2015-01-12 DIAGNOSIS — E039 Hypothyroidism, unspecified: Secondary | ICD-10-CM | POA: Diagnosis present

## 2015-01-12 DIAGNOSIS — R002 Palpitations: Secondary | ICD-10-CM | POA: Diagnosis not present

## 2015-01-12 DIAGNOSIS — K219 Gastro-esophageal reflux disease without esophagitis: Secondary | ICD-10-CM | POA: Insufficient documentation

## 2015-01-12 DIAGNOSIS — I4891 Unspecified atrial fibrillation: Secondary | ICD-10-CM | POA: Diagnosis not present

## 2015-01-12 MED ORDER — ASPIRIN 325 MG PO TABS
325.0000 mg | ORAL_TABLET | Freq: Once | ORAL | Status: AC
  Administered 2015-01-13: 325 mg via ORAL
  Filled 2015-01-12: qty 1

## 2015-01-12 NOTE — ED Notes (Signed)
Pt c/o left arm pain and states it feels as if her heart is racing.

## 2015-01-13 ENCOUNTER — Emergency Department (HOSPITAL_COMMUNITY): Payer: Medicare Other

## 2015-01-13 ENCOUNTER — Encounter (HOSPITAL_COMMUNITY): Payer: Self-pay | Admitting: *Deleted

## 2015-01-13 ENCOUNTER — Observation Stay (HOSPITAL_BASED_OUTPATIENT_CLINIC_OR_DEPARTMENT_OTHER): Payer: Medicare Other

## 2015-01-13 DIAGNOSIS — R079 Chest pain, unspecified: Secondary | ICD-10-CM | POA: Diagnosis not present

## 2015-01-13 DIAGNOSIS — E079 Disorder of thyroid, unspecified: Secondary | ICD-10-CM | POA: Diagnosis not present

## 2015-01-13 DIAGNOSIS — N8320 Unspecified ovarian cysts: Secondary | ICD-10-CM | POA: Diagnosis not present

## 2015-01-13 DIAGNOSIS — I4891 Unspecified atrial fibrillation: Secondary | ICD-10-CM | POA: Diagnosis not present

## 2015-01-13 DIAGNOSIS — R002 Palpitations: Secondary | ICD-10-CM | POA: Diagnosis not present

## 2015-01-13 LAB — COMPREHENSIVE METABOLIC PANEL
ALBUMIN: 3.8 g/dL (ref 3.5–5.0)
ALT: 14 U/L (ref 14–54)
ANION GAP: 7 (ref 5–15)
AST: 20 U/L (ref 15–41)
Alkaline Phosphatase: 41 U/L (ref 38–126)
BUN: 20 mg/dL (ref 6–20)
CHLORIDE: 105 mmol/L (ref 101–111)
CO2: 28 mmol/L (ref 22–32)
Calcium: 9.1 mg/dL (ref 8.9–10.3)
Creatinine, Ser: 0.75 mg/dL (ref 0.44–1.00)
GFR calc Af Amer: >60 mL/min (ref >60)
GFR calc non Af Amer: >60 mL/min (ref >60)
GLUCOSE: 112 mg/dL — AB (ref 65–99)
POTASSIUM: 4 mmol/L (ref 3.5–5.1)
SODIUM: 140 mmol/L (ref 135–145)
TOTAL PROTEIN: 7 g/dL (ref 6.5–8.1)
Total Bilirubin: 0.3 mg/dL (ref 0.3–1.2)

## 2015-01-13 LAB — CBC
HCT: 35.2 % — ABNORMAL LOW (ref 36.0–46.0)
HCT: 35.2 % — ABNORMAL LOW (ref 36.0–46.0)
HEMOGLOBIN: 12.2 g/dL (ref 12.0–15.0)
Hemoglobin: 12.2 g/dL (ref 12.0–15.0)
MCH: 32.8 pg (ref 26.0–34.0)
MCH: 32.9 pg (ref 26.0–34.0)
MCHC: 34.7 g/dL (ref 30.0–36.0)
MCHC: 34.7 g/dL (ref 30.0–36.0)
MCV: 94.6 fL (ref 78.0–100.0)
MCV: 94.9 fL (ref 78.0–100.0)
PLATELETS: 176 10*3/uL (ref 150–400)
PLATELETS: 183 10*3/uL (ref 150–400)
RBC: 3.71 MIL/uL — AB (ref 3.87–5.11)
RBC: 3.72 MIL/uL — ABNORMAL LOW (ref 3.87–5.11)
RDW: 12.7 % (ref 11.5–15.5)
RDW: 12.7 % (ref 11.5–15.5)
WBC: 5.3 10*3/uL (ref 4.0–10.5)
WBC: 6.2 10*3/uL (ref 4.0–10.5)

## 2015-01-13 LAB — MAGNESIUM: Magnesium: 1.9 mg/dL (ref 1.7–2.4)

## 2015-01-13 LAB — BASIC METABOLIC PANEL
ANION GAP: 8 (ref 5–15)
BUN: 22 mg/dL — ABNORMAL HIGH (ref 6–20)
CALCIUM: 9.4 mg/dL (ref 8.9–10.3)
CHLORIDE: 103 mmol/L (ref 101–111)
CO2: 28 mmol/L (ref 22–32)
CREATININE: 0.79 mg/dL (ref 0.44–1.00)
GFR calc non Af Amer: >60 mL/min (ref >60)
Glucose, Bld: 114 mg/dL — ABNORMAL HIGH (ref 65–99)
Potassium: 3.9 mmol/L (ref 3.5–5.1)
SODIUM: 139 mmol/L (ref 135–145)

## 2015-01-13 LAB — TROPONIN I

## 2015-01-13 LAB — TSH: TSH: 1.35 u[IU]/mL (ref 0.350–4.500)

## 2015-01-13 MED ORDER — SODIUM CHLORIDE 0.9 % IV SOLN
INTRAVENOUS | Status: DC
  Administered 2015-01-13: 1 mL via INTRAVENOUS

## 2015-01-13 MED ORDER — METOPROLOL TARTRATE 25 MG PO TABS
25.0000 mg | ORAL_TABLET | Freq: Two times a day (BID) | ORAL | Status: DC
  Administered 2015-01-13 (×2): 25 mg via ORAL
  Filled 2015-01-13 (×2): qty 1

## 2015-01-13 MED ORDER — ENOXAPARIN SODIUM 40 MG/0.4ML ~~LOC~~ SOLN
40.0000 mg | SUBCUTANEOUS | Status: DC
Start: 2015-01-13 — End: 2015-01-14
  Administered 2015-01-13: 40 mg via SUBCUTANEOUS
  Filled 2015-01-13: qty 0.4

## 2015-01-13 MED ORDER — ASPIRIN EC 81 MG PO TBEC
81.0000 mg | DELAYED_RELEASE_TABLET | Freq: Every day | ORAL | Status: DC
  Administered 2015-01-13 – 2015-01-14 (×2): 81 mg via ORAL
  Filled 2015-01-13 (×2): qty 1

## 2015-01-13 MED ORDER — ONDANSETRON HCL 4 MG/2ML IJ SOLN: 4.0000 mg | Freq: Four times a day (QID) | INTRAMUSCULAR | Status: DC | PRN

## 2015-01-13 MED ORDER — ONDANSETRON HCL 4 MG PO TABS: 4.0000 mg | ORAL_TABLET | Freq: Four times a day (QID) | ORAL | Status: DC | PRN

## 2015-01-13 MED ORDER — LEVOTHYROXINE SODIUM 75 MCG PO TABS
75.0000 ug | ORAL_TABLET | Freq: Every day | ORAL | Status: DC
  Administered 2015-01-13 – 2015-01-14 (×2): 75 ug via ORAL
  Filled 2015-01-13 (×2): qty 1

## 2015-01-13 NOTE — H&P (Signed)
PCP:   Glo Herring., MD   Chief Complaint:  Left arm pain  HPI:  69 year old female who  has a past medical history of Ovarian cyst; Thyroid disease; Hypercholesteremia; GERD (gastroesophageal reflux disease); Restless leg syndrome; PONV (postoperative nausea and vomiting); Cancer (1988, 2015); Anxiety; and radiation therapy (01/16/14-02/27/14). Today presents to the hospital with chief complaint of left arm pain and palpitations which started yesterday evening. Patient says that the pain is better. In the ED patient was found to be in new onset A. Fib, heart rate is controlled. Chads 2 vasc  Score is 2. Patient did take aspirin before coming to the hospital. She denies shortness of breath, no nausea vomiting or diarrhea. No fever no abdominal pain. She does have a history of metastatic adenocarcinoma involving low abdominal wall mass.  Allergies:  No Known Allergies    Past Medical History  Diagnosis Date  . Ovarian cyst   . Thyroid disease   . Hypercholesteremia     under control with diet and fish oil  . GERD (gastroesophageal reflux disease)     hx of years ago  . Restless leg syndrome   . PONV (postoperative nausea and vomiting)   . 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 Anxiety     new dx  . Hx of radiation therapy 01/16/14-02/27/14    abdomen     Past Surgical History  Procedure Laterality Date  . Oophorectomy      BSO  . Total abdominal hysterectomy  1988    ovarian cyst  BSO  . Cholecystectomy  2009  . Tonsillectomy  as child  . Laparotomy N/A 11/05/2013    Procedure: RESECTION OF PELVIC MASS;  Surgeon: Imagene Gurney A. Alycia Rossetti, MD;  Location: [facility];  Service: Gynecology;  Laterality: N/A;  . Application of a-cell of chest/abdomen N/A 11/05/2013    Procedure: ABDOMINAL WALL RESCONTRUCTION WITH STRATUS;  Surgeon: Irene Limbo, MD;  Location: [facility];  Service: Plastics;  Laterality: N/A;  . Appendectomy      Prior to Admission medications   Medication  Sig Start Date End Date Taking? Authorizing Provider  ALPRAZolam (XANAX) 0.25 MG tablet Take 0.25 mg by mouth at bedtime as needed.  08/27/14  Yes Historical Provider, MD  Biotin 5000 MCG CAPS Take 1 capsule by mouth daily.    Yes Historical Provider, MD  Calcium Carb-Cholecalciferol (CALCIUM 600 + D PO) Take 1 tablet by mouth daily.   Yes Historical Provider, MD  Cholecalciferol (EQL VITAMIN D3) 1000 UNITS tablet Take 1,000 Units by mouth daily.   Yes Historical Provider, MD  estrogens, conjugated, (PREMARIN) 0.625 MG tablet Take as directed Patient taking differently: Take 0.625 mg by mouth 2 (two) times a week. Take as directed 01/31/14  Yes Melissa D Cross, NP  levothyroxine (SYNTHROID, LEVOTHROID) 75 MCG tablet Take 75 mcg by mouth every morning.    Yes Historical Provider, MD  Multiple Vitamins-Minerals (MULTIVITAMIN WITH MINERALS) tablet Take 1 tablet by mouth daily.    Yes Historical Provider, MD  Omega-3 Fatty Acids (FISH OIL) 1200 MG CAPS Take 1,200 mg by mouth daily.    Yes Historical Provider, MD    Social History:  reports that she has never smoked. She has never used smokeless tobacco. She reports that she does not drink alcohol or use illicit drugs.  Family History  Problem Relation Age of Onset  . Heart disease Mother   . Heart disease Sister   . Diabetes Sister   .  Cancer Sister     melanoma-skin cancer  . Breast cancer Paternal Grandmother     Age 23's  . Cancer Paternal Grandmother     breast  . Cancer Paternal Grandfather     prostate/bladder    Filed Weights   01/12/15 2341  Weight: 57.607 kg (127 lb)    All the positives are listed in BOLD  Review of Systems:  HEENT: Headache, blurred vision, runny nose, sore throat Neck: Hypothyroidism, hyperthyroidism,,lymphadenopathy Chest : Shortness of breath, history of COPD, Asthma Heart : Chest pain, history of coronary arterey disease, palpitations GI:  Nausea, vomiting, diarrhea, constipation, GERD GU: Dysuria,  urgency, frequency of urination, hematuria Neuro: Stroke, seizures, syncope Psych: Depression, anxiety, hallucinations   Physical Exam: Blood pressure 151/63, pulse 109, temperature 97.9 F (36.6 C), resp. rate 20, height '5\' 7"'$  (1.702 m), weight 57.607 kg (127 lb), SpO2 100 %. Constitutional:   Patient is a well-developed and well-nourished female in no acute distress and cooperative with exam. Head: Normocephalic and atraumatic Mouth: Mucus membranes moist Eyes: PERRL, EOMI, conjunctivae normal Neck: Supple, No Thyromegaly Cardiovascular: RRR, S1 normal, S2 normal Pulmonary/Chest: CTAB, no wheezes, rales, or rhonchi Abdominal: Soft. Non-tender, non-distended, bowel sounds are normal, no masses, organomegaly, or guarding present.  Neurological: A&O x3, Strength is normal and symmetric bilaterally, cranial nerve II-XII are grossly intact, no focal motor deficit, sensory intact to light touch bilaterally.  Extremities : No Cyanosis, Clubbing or Edema  Labs on Admission:  Basic Metabolic Panel:  Recent Labs Lab 01/13/15 0010  NA 139  K 3.9  CL 103  CO2 28  GLUCOSE 114*  BUN 22*  CREATININE 0.79  CALCIUM 9.4   CBC:  Recent Labs Lab 01/13/15 0010  WBC 5.3  HGB 12.2  HCT 35.2*  MCV 94.9  PLT 183   Cardiac Enzymes:  Recent Labs Lab 01/13/15 0010  TROPONINI <0.03     Radiological Exams on Admission: Dg Chest 2 View  01/13/2015   CLINICAL DATA:  Palpitations, onset yesterday  EXAM: CHEST  2 VIEW  COMPARISON:  11/01/2013  FINDINGS: There is hyperinflation. The lungs are clear. The pulmonary vasculature is normal. There are no pleural effusions. Heart size is normal. Hilar and mediastinal contours are unremarkable unchanged.  IMPRESSION: Hyperinflation.   Electronically Signed   By: Andreas Newport M.D.   On: 01/13/2015 01:04    EKG: Independently reviewed. Atrial fibrillation   Assessment/Plan Active Problems:   Atrial fibrillation   Chest pain    Hypothyroidism  New onset A. Fib Will start the patient on metoprolol 25 mg by mouth twice a day Continue aspirin 81 mg by mouth daily CHADS2 VASC score is: 2 Cardiology consultation in a.m. Patient will be a candidate for anticoagulation, will let cardiology discussed with  Patient  Left arm pain Concern for cardiac in origin Check serial cardiac enzymes Also check echocardiogram in a.m.  Hypothyroidism Patient takes Synthroid 75 g by mouth daily Check TSH  DVT prophylaxis Lovenox    Code status: Full code   Family discussion: Admission, patients condition and plan of care including tests being ordered have been discussed with the patient and her neighbor at bedside* who indicate understanding and agree with the plan and Code Status.   Time Spent on Admission: 60 minutes  [facility] Pager: [phone] 01/13/2015, 2:07 AM  If 7PM-7AM, please contact night-coverage  [email or web address]  Password [email or web address]

## 2015-01-13 NOTE — Progress Notes (Signed)
Patient briefly seen. Admitted earlier today for palpitations and left arm pain. She was found to be in new onset afib with controlled HR. CHADS score is at least 2. On ASA for now. Believe she would benefit from anticoagulation given her history of ovarian cancer, but she is hesitant to start and would prefer to discuss with her oncologist. ECHO pending. K 4.0, Mg 1.9, TSH 1.350. Will continue to follow.  Domingo Mend, MD [facility] Pager: [phone]

## 2015-01-13 NOTE — ED Provider Notes (Signed)
CSN: [id number]     Arrival date & time 01/12/15  2325 History   First MD Initiated Contact with Patient 01/12/15 2356     Chief Complaint  Patient presents with  . Palpitations     (Consider location/radiation/quality/duration/timing/severity/associated sxs/prior Treatment) HPI  This is a 69 year old female with a history of ovarian cancer who presents with left arm pain and palpitations. Patient reports onset of symptoms at approximate 7 PM. She describes pain in her left arm and feeling like her heart was racing. Initially her pain was 3 out of 10. It is currently 0 out of 10. She took a full dose aspirin prior to arrival. The pain was not exertional. It came on while she was sitting. She reports a history of possible PVCs and mitral valve prolapse. She is never required any treatment. She reports a remote history of stress testing that was "normal. No history of hypertension. Does have a history of hypercholesterolemia. Is a nonsmoker. Denies any leg swelling or history of blood clots.  Past Medical History  Diagnosis Date  . Ovarian cyst   . Thyroid disease   . Hypercholesteremia     under control with diet and fish oil  . GERD (gastroesophageal reflux disease)     hx of years ago  . Restless leg syndrome   . PONV (postoperative nausea and vomiting)   . 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 Anxiety     new dx  . Hx of radiation therapy 01/16/14-02/27/14    abdomen    Past Surgical History  Procedure Laterality Date  . Oophorectomy      BSO  . Total abdominal hysterectomy  1988    ovarian cyst  BSO  . Cholecystectomy  2009  . Tonsillectomy  as child  . Laparotomy N/A 11/05/2013    Procedure: RESECTION OF PELVIC MASS;  Surgeon: Imagene Gurney A. Alycia Rossetti, MD;  Location: [facility];  Service: Gynecology;  Laterality: N/A;  . Application of a-cell of chest/abdomen N/A 11/05/2013    Procedure: ABDOMINAL WALL RESCONTRUCTION WITH STRATUS;  Surgeon: Irene Limbo, MD;  Location: [facility]  [facility];  Service: Plastics;  Laterality: N/A;  . Appendectomy     Family History  Problem Relation Age of Onset  . Heart disease Mother   . Heart disease Sister   . Diabetes Sister   . Cancer Sister     melanoma-skin cancer  . Breast cancer Paternal Grandmother     Age 69's  . Cancer Paternal Grandmother     breast  . Cancer Paternal Grandfather     prostate/bladder   Social History  Substance Use Topics  . Smoking status: Never Smoker   . Smokeless tobacco: Never Used  . Alcohol Use: No   OB History    Gravida Para Term Preterm AB TAB SAB Ectopic Multiple Living   0              Review of Systems  Constitutional: Negative for fever.  Respiratory: Negative for chest tightness and shortness of breath.   Cardiovascular: Positive for palpitations. Negative for chest pain and leg swelling.  Gastrointestinal: Negative for nausea, vomiting and abdominal pain.  Genitourinary: Negative for dysuria.  Musculoskeletal: Negative for back pain.       Left arm pain  Neurological: Negative for headaches.  All other systems reviewed and are negative.     Allergies  Review of patient's allergies indicates no known allergies.  Home Medications   Prior to Admission medications  Medication Sig Start Date End Date Taking? Authorizing Provider  ALPRAZolam (XANAX) 0.25 MG tablet Take 0.25 mg by mouth at bedtime as needed.  08/27/14  Yes Historical Provider, MD  Biotin 5000 MCG CAPS Take 1 capsule by mouth daily.    Yes Historical Provider, MD  Calcium Carb-Cholecalciferol (CALCIUM 600 + D PO) Take 1 tablet by mouth daily.   Yes Historical Provider, MD  Cholecalciferol (EQL VITAMIN D3) 1000 UNITS tablet Take 1,000 Units by mouth daily.   Yes Historical Provider, MD  estrogens, conjugated, (PREMARIN) 0.625 MG tablet Take as directed Patient taking differently: Take 0.625 mg by mouth 2 (two) times a week. Take as directed 01/31/14  Yes Melissa D Cross, NP  levothyroxine (SYNTHROID,  LEVOTHROID) 75 MCG tablet Take 75 mcg by mouth every morning.    Yes Historical Provider, MD  Multiple Vitamins-Minerals (MULTIVITAMIN WITH MINERALS) tablet Take 1 tablet by mouth daily.    Yes Historical Provider, MD  Omega-3 Fatty Acids (FISH OIL) 1200 MG CAPS Take 1,200 mg by mouth daily.    Yes Historical Provider, MD   BP 151/63 mmHg  Pulse 109  Temp(Src) 97.9 F (36.6 C)  Resp 20  Ht '5\' 7"'$  (1.702 m)  Wt 127 lb (57.607 kg)  BMI 19.89 kg/m2  SpO2 100% Physical Exam  Constitutional: She is oriented to person, place, and time. She appears well-developed and well-nourished. No distress.  HENT:  Head: Normocephalic and atraumatic.  Cardiovascular: Normal heart sounds.   Tachycardia, irregular rhythm  Pulmonary/Chest: Effort normal and breath sounds normal. No respiratory distress. She has no wheezes. She exhibits no tenderness.  Abdominal: Soft. Bowel sounds are normal. There is no tenderness. There is no rebound.  Musculoskeletal: She exhibits no edema.  Neurological: She is alert and oriented to person, place, and time.  Skin: Skin is warm and dry.  Psychiatric: She has a normal mood and affect.  Nursing note and vitals reviewed.   ED Course  Procedures (including critical care time) Labs Review Labs Reviewed  BASIC METABOLIC PANEL - Abnormal; Notable for the following:    Glucose, Bld 114 (*)    BUN 22 (*)    All other components within normal limits  CBC - Abnormal; Notable for the following:    RBC 3.71 (*)    HCT 35.2 (*)    All other components within normal limits  TROPONIN I    Imaging Review Dg Chest 2 View  01/13/2015   CLINICAL DATA:  Palpitations, onset yesterday  EXAM: CHEST  2 VIEW  COMPARISON:  11/01/2013  FINDINGS: There is hyperinflation. The lungs are clear. The pulmonary vasculature is normal. There are no pleural effusions. Heart size is normal. Hilar and mediastinal contours are unremarkable unchanged.  IMPRESSION: Hyperinflation.   Electronically  Signed   By: Andreas Newport M.D.   On: 01/13/2015 01:04   I have personally reviewed and evaluated these images and lab results as part of my medical decision-making.   EKG Interpretation   Date/Time:  Monday January 12 2015 23:48:04 EDT Ventricular Rate:  91 PR Interval:    QRS Duration: 80 QT Interval:  370 QTC Calculation: 455 R Axis:   74 Text Interpretation:  Atrial fibrillation Nonspecific ST abnormality  Abnormal ECG Confirmed by HORTON  MD, COURTNEY ([id number]) on 01/13/2015  1:07:27 AM      MDM   Final diagnoses:  Chest pain, unspecified chest pain type  Atrial fibrillation, unspecified    Patient presents for left arm pain and  palpitations. EKG is notable for a rate controlled atrial fibrillation. This is new for the patient. She is currently pain-free.  While the patient is relatively low-risk for ACS, given her age, would be concerned for an anginal equivalent. Patient has taken an aspirin. No ischemic changes on EKG. Lab work including troponin is reassuring. Patient is currently asymptomatic. Discussed with patient admission for serial enzymes. Given that there is no leg swelling and pain is resolved, have lower suspicion for PE.  Discussed admission with Dr. Darrick Meigs.    Merryl Hacker, MD 01/13/15 [phone]

## 2015-01-13 NOTE — Care Management Note (Signed)
Case Management Note  Patient Details  Name: [name] MRN: [id number] Date of Birth: 1946-03-08  Subjective/Objective:                  Pt admitted from home with new onset a fib. Pt lives alone and will return home at discharge. Pt is independent with ADL's.  Action/Plan: No Cm needs noted. Anticipate discharge within 24 hours.  Expected Discharge Date:  01/15/15               Expected Discharge Plan:  Home/Self Care  In-House Referral:  NA  Discharge planning Services  CM Consult  Post Acute Care Choice:  NA Choice offered to:  NA  DME Arranged:    DME Agency:     HH Arranged:    HH Agency:     Status of Service:  Completed, signed off  Medicare Important Message Given:    Date Medicare IM Given:    Medicare IM give by:    Date Additional Medicare IM Given:    Additional Medicare Important Message give by:     If discussed at [place] of Stay Meetings, dates discussed:    Additional Comments:  Joylene Draft, RN 01/13/2015, 1:28 PM

## 2015-01-13 NOTE — Consult Note (Signed)
CARDIOLOGY CONSULT NOTE   Patient ID: LUETTA PIAZZA MRN: [id number] DOB/AGE: 10/10/1945 69 y.o.  Admit Date: 01/12/2015 Referring Physician: PTH Primary Physician: Glo Herring., MD Consulting Cardiologist: Carlyle Dolly MD Primary Cardiologist New Reason for Consultation: New Onset Atrial fibrillation   Clinical Summary Ms. Rezek is a 69 y.o.female with known history of hypothyroidism, hypercholesterolemia, with GERD,ovarian CA, s/p radiation, anxiety, who presented to ER with complaints of chest pain with left arm pain and palpitations, and found to have new onset atrial fib per EKG.   She states she has been feeling her heart rate become irregular over the summer, last several minutes and then returning to normal. She has also been having intermittent left arm pain which has not been severe, going away on its own. She states that she was sitting on the couch around 7 pm last evening when she felt her heart become irregular and she began to have left arm pain. She waited until around 11 pm but the symptoms did not resolve and she presented to ER. She denies dizziness, dyspnea, diaphoresis, weakness, or nausea.   On arrival BP 151/63, labs essentially unremarkable, she was not anemic. CXR with evidence of hyperinflation, but not evidence of CHF or pneumonia. EKG demonstrated atrial fib with rate of 94 bpm. Echo has been ordered. Troponin 0.03 and 0.03 respectively. TSH normal.  She was treated with ASA in ER. She has since been placed on metoprolol 25 mg BID,with first dose given at 2 am.   On review of telemetry she appears to have converted to NSR with PAC's around 7:58 this am. She states she feels better and can that her heart is not as irregular. She denies left arm pain currently.   No Known Allergies  Medications Scheduled Medications: . aspirin EC  81 mg Oral Daily  . enoxaparin (LOVENOX) injection  40 mg Subcutaneous Q24H  . levothyroxine  75 mcg Oral QAC  breakfast  . metoprolol tartrate  25 mg Oral BID    Infusions: . sodium chloride 1 mL (01/13/15 0315)    PRN Medications: ondansetron **OR** ondansetron (ZOFRAN) IV   Past Medical History  Diagnosis Date  . Ovarian cyst   . Thyroid disease   . Hypercholesteremia     under control with diet and fish oil  . GERD (gastroesophageal reflux disease)     hx of years ago  . Restless leg syndrome   . PONV (postoperative nausea and vomiting)   . 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 Anxiety     new dx  . Hx of radiation therapy 01/16/14-02/27/14    abdomen     Past Surgical History  Procedure Laterality Date  . Oophorectomy      BSO  . Total abdominal hysterectomy  1988    ovarian cyst  BSO  . Cholecystectomy  2009  . Tonsillectomy  as child  . Laparotomy N/A 11/05/2013    Procedure: RESECTION OF PELVIC MASS;  Surgeon: Imagene Gurney A. Alycia Rossetti, MD;  Location: [facility];  Service: Gynecology;  Laterality: N/A;  . Application of a-cell of chest/abdomen N/A 11/05/2013    Procedure: ABDOMINAL WALL RESCONTRUCTION WITH STRATUS;  Surgeon: Irene Limbo, MD;  Location: [facility];  Service: Plastics;  Laterality: N/A;  . Appendectomy      Family History  Problem Relation Age of Onset  . Heart disease Mother   . Heart disease Sister   . Diabetes Sister   . Cancer Sister  melanoma-skin cancer  . Breast cancer Paternal Grandmother     Age 62's  . Cancer Paternal Grandmother     breast  . Cancer Paternal Grandfather     prostate/bladder    Social History Ms. Arpin reports that she has never smoked. She has never used smokeless tobacco. Ms. Doiron reports that she does not drink alcohol.  Review of Systems Complete review of systems are found to be negative unless outlined in H&P above.  Physical Examination Blood pressure 145/66, pulse 171, temperature 98 F (36.7 C), temperature source Oral, resp. rate 18, height '5\' 7"'$  (1.702 m), weight 129 lb (58.514 kg), SpO2 100  %.  Intake/Output Summary (Last 24 hours) at 01/13/15 0805 Last data filed at 01/13/15 0600  Gross per 24 hour  Intake  137.5 ml  Output      0 ml  Net  137.5 ml    Telemetry: NSR with PAC's. Reviewed and saw that she converted to NSR around 7:58 this am.  JAS:NKNLZJQ quietly.  HEENT: Conjunctiva and lids normal, oropharynx clear with moist mucosa. Neck: Supple, no elevated JVP or carotid bruits, no thyromegaly. Lungs: Clear to auscultation, nonlabored breathing at rest. Cardiac: Regular rate and rhythm, no S3 or significant systolic murmur, no pericardial rub. Abdomen: Soft, nontender, thickening from mess in abdomen, no hepatomegaly, bowel sounds present, no guarding or rebound. Extremities: No pitting edema, distal pulses 2+. Skin: Warm and dry. Musculoskeletal: No kyphosis. Neuropsychiatric: Alert and oriented x3, affect grossly appropriate.  Prior Cardiac Testing/Procedures None Lab Results  Basic Metabolic Panel:  Recent Labs Lab 01/13/15 0010 01/13/15 0327  NA 139 140  K 3.9 4.0  CL 103 105  CO2 28 28  GLUCOSE 114* 112*  BUN 22* 20  CREATININE 0.79 0.75  CALCIUM 9.4 9.1    Liver Function Tests:  Recent Labs Lab 01/13/15 0327  AST 20  ALT 14  ALKPHOS 41  BILITOT 0.3  PROT 7.0  ALBUMIN 3.8    CBC:  Recent Labs Lab 01/13/15 0010 01/13/15 0327  WBC 5.3 6.2  HGB 12.2 12.2  HCT 35.2* 35.2*  MCV 94.9 94.6  PLT 183 176    Cardiac Enzymes:  Recent Labs Lab 01/13/15 0010 01/13/15 0327  TROPONINI <0.03 <0.03    Radiology: Dg Chest 2 View  01/13/2015   CLINICAL DATA:  Palpitations, onset yesterday  EXAM: CHEST  2 VIEW  COMPARISON:  11/01/2013  FINDINGS: There is hyperinflation. The lungs are clear. The pulmonary vasculature is normal. There are no pleural effusions. Heart size is normal. Hilar and mediastinal contours are unremarkable unchanged.  IMPRESSION: Hyperinflation.   Electronically Signed   By: Andreas Newport M.D.   On: 01/13/2015  01:04     ECG: New one for this am pending. Initial EKG, Atrial fib with rate of 94 bpm.   Impression and Recommendations  1. New Onset Atrial Fib: Now appears to have converted to NSR this am around 7:58 per telemetry after receiving Toprol 25 mg . She is feeling more comfortable and can tell that her heart rate is more norma.  Repeating EKG for confirmation. Agree with echocardiogram for LV fx and cardiac structure. TSH was found to be WNL. She was not found to be anemic. CHADS VASC Score of 2. Recommend Eliquis  mg BID for CVA prevention in the setting of PAF. She is currently on ASA. Would stop this if NOAC is used.   2.  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    3. Hypothyroid: TSH is normal. She is continued on synthroid.   Signed: Phill Myron. Lawrence NP [place]  01/13/2015, 8:05 AM Co-Sign MD  Patient seen and discussed with NP Purcell Nails, I agree with her documentation above. 69 yo female history of ovarian CA and thyroid disease admitted with palpitations and left arm pain. Arm coincided with onset of the palpitations, she denies any other episodes of arm pain or chest pain.   Hgb 12.2, Plt 183, Cr 0.79, K 3.9, TSH 1.35. Trop neg x 2 CXR hyperinflation EKG afib rate 90   New onset afib. Has converted back to NSR, she is on lopressor '25mg'$  bid for rate control. CHADS2Vasc score of 2 (age, gender), currently on aspirin. We will touch base with her oncologist about her candidacy for anticoag given her metastatic ovarian CA. She herself remains hesitant for anticoagulation at this time as well. Add Mg to labs. F/u echo results.  Zandra Abts MD

## 2015-01-14 DIAGNOSIS — E039 Hypothyroidism, unspecified: Secondary | ICD-10-CM | POA: Diagnosis not present

## 2015-01-14 DIAGNOSIS — R079 Chest pain, unspecified: Secondary | ICD-10-CM | POA: Diagnosis not present

## 2015-01-14 DIAGNOSIS — I4891 Unspecified atrial fibrillation: Secondary | ICD-10-CM | POA: Diagnosis not present

## 2015-01-14 MED ORDER — METOPROLOL TARTRATE 25 MG PO TABS: 12.5000 mg | ORAL_TABLET | Freq: Two times a day (BID) | ORAL | Status: DC

## 2015-01-14 MED ORDER — ASPIRIN 81 MG PO TBEC: 81.0000 mg | DELAYED_RELEASE_TABLET | Freq: Every day | ORAL | Status: DC

## 2015-01-14 NOTE — Progress Notes (Signed)
Patient night time dose of metoprolol 25 mg held due to heart rate being less than 60.  Mid-level notified and parameters added to medication.  Patient stable in night, heart rate and bp stable.  No complaints of pain.

## 2015-01-14 NOTE — Discharge Summary (Signed)
Physician Discharge Summary  [name] BMW:[id number] DOB: 10-19-1945 DOA: 01/12/2015  PCP: Glo Herring., MD  Admit date: 01/12/2015 Discharge date: 01/14/2015  Recommendations for Outpatient Follow-up:   Follow-up new diagnosis of atrial fibrillation. Continue metoprolol 12.5 mg BID. Continue ASA, reconsider anticoagulation as an outpatient. Follow-up with cardiology in 2 weeks.  Follow-up Information    Follow up with Glo Herring., MD.   Specialty:  Internal Medicine   Why:  As needed   Contact information:   [address] [place] [address] [phone]       Follow up with Carlyle Dolly, MD.   Specialty:  Cardiology   Why:  office will contact you with appointment   Contact information:   [address] [place] [address] [phone]        Discharge Diagnoses:  1. New onset atrial fibrillation  2. Chest and left arm pain  3. Hypothyroidism  4. Ovarian cancer  Discharge Condition: Improved Disposition: Discharge home  Diet recommendation: Regular  Filed Weights   01/12/15 2341 01/13/15 0300  Weight: 57.607 kg (127 lb) 58.514 kg (129 lb)    History of present illness:  67 yow presented with left arm pain and palpations. In the ED found to be in new onset A. Fib with controlled heart rate. CHADS score = 2. Admitted for further evaluation and monitoring.   Hospital Course:  Atrial fibrillation converted to NSR after receiving metoprolol. Chest and left arm pain which were likely secondary to palpations both resolved. No evidence of ACS. Cardiology evaluated and recommended anticoagulation.  lthough she is a candidate for anticoagulants patient is hesitate due to current cancer treatments. Recommended to continue ASA and follow up with cardiology as outpatient in two weeks to revisit anticoagulant options.   Individual issues as below:  1. New onset atrial fibrillation. Converted to NSR after receiving Toprol; decreased to  12.5 BID. Echo with normal LVEF. TSH WNL. CHADS2Vasc score of 2 (age, gender). Will continue ASA PO daily. Cardiology will confer with oncology and decide on anticoagulation as an outpatient as patient is hesitant to start anticoagulation. Continue ASA for now.  2. Chest and left arm pain. Resolved, likely secondary to palpatations. Echo revealed ejection fraction of 55%-60%. No further evaluation per cardiology. 3. Hypothyroidism. TSH WNL. Continue Synthroid. 4. Ovarian cancer. S/p radiation and chemo, she is being followed by oncology.   Consultants:  Cardiology  Procedures:  ECHO - Left ventricle: The cavity size was normal. Wall thickness was normal. Systolic function was normal. The estimated ejection fraction was in the range of 55% to 60%. Wall motion was normal; there were no regional wall motion abnormalities. Left ventricular diastolic function parameters were normal. - Mitral valve: Mildly calcified annulus. There was mild regurgitation. - Left atrium: The atrium was moderately dilated. Volume/bsa, S: 37.9 ml/m^2. - Right atrium: The atrium was mildly to moderately dilated. - Tricuspid valve: There was mild-moderate regurgitation.  Antibiotics:  None given  Discharge Instructions Discharge Instructions    Activity as tolerated - No restrictions    Complete by:  As directed      Diet general    Complete by:  As directed      Discharge instructions    Complete by:  As directed   Call your physician or seek immediate medical attention for pain, fast heart rate, shortness of breath or worsening of condition.            Discharge Medication List as of 01/14/2015 11:15 AM  START taking these medications   Details  aspirin EC 81 MG EC tablet Take 1 tablet (81 mg total) by mouth daily., Starting 01/14/2015, Until Discontinued, No Print    metoprolol tartrate (LOPRESSOR) 25 MG tablet Take 0.5 tablets (12.5 mg total) by mouth 2 (two) times daily.,  Starting 01/14/2015, Until Discontinued, Normal      CONTINUE these medications which have NOT CHANGED   Details  ALPRAZolam (XANAX) 0.25 MG tablet Take 0.25 mg by mouth at bedtime as needed. , Starting 08/27/2014, Until Discontinued, Historical Med    Biotin 5000 MCG CAPS Take 1 capsule by mouth daily. , Until Discontinued, Historical Med    Calcium Carb-Cholecalciferol (CALCIUM 600 + D PO) Take 1 tablet by mouth daily., Until Discontinued, Historical Med    Cholecalciferol (EQL VITAMIN D3) 1000 UNITS tablet Take 1,000 Units by mouth daily., Until Discontinued, Historical Med    estrogens, conjugated, (PREMARIN) 0.625 MG tablet Take as directed, Normal    levothyroxine (SYNTHROID, LEVOTHROID) 75 MCG tablet Take 75 mcg by mouth every morning. , Until Discontinued, Historical Med    Multiple Vitamins-Minerals (MULTIVITAMIN WITH MINERALS) tablet Take 1 tablet by mouth daily. , Until Discontinued, Historical Med    Omega-3 Fatty Acids (FISH OIL) 1200 MG CAPS Take 1,200 mg by mouth daily. , Until Discontinued, Historical Med       No Known Allergies  The results of significant diagnostics from this hospitalization (including imaging, microbiology, ancillary and laboratory) are listed below for reference.    Significant Diagnostic Studies: Dg Chest 2 View  01/13/2015   CLINICAL DATA:  Palpitations, onset yesterday  EXAM: CHEST  2 VIEW  COMPARISON:  11/01/2013  FINDINGS: There is hyperinflation. The lungs are clear. The pulmonary vasculature is normal. There are no pleural effusions. Heart size is normal. Hilar and mediastinal contours are unremarkable unchanged.  IMPRESSION: Hyperinflation.   Electronically Signed   By: Andreas Newport M.D.   On: 01/13/2015 01:04   Labs: Basic Metabolic Panel:  Recent Labs Lab 01/13/15 0010 01/13/15 0327 01/13/15 0924  NA 139 140  --   K 3.9 4.0  --   CL 103 105  --   CO2 28 28  --   GLUCOSE 114* 112*  --   BUN 22* 20  --   CREATININE 0.79  0.75  --   CALCIUM 9.4 9.1  --   MG  --   --  1.9   Liver Function Tests:  Recent Labs Lab 01/13/15 0327  AST 20  ALT 14  ALKPHOS 41  BILITOT 0.3  PROT 7.0  ALBUMIN 3.8  CBC:  Recent Labs Lab 01/13/15 0010 01/13/15 0327  WBC 5.3 6.2  HGB 12.2 12.2  HCT 35.2* 35.2*  MCV 94.9 94.6  PLT 183 176   Cardiac Enzymes:  Recent Labs Lab 01/13/15 0010 01/13/15 0327 01/13/15 0924 01/13/15 1518  TROPONINI <0.03 <0.03 <0.03 <0.03    Principal Problem:   Atrial fibrillation Active Problems:   Hypothyroidism   Chest pain   Time coordinating discharge: 35 minutes   Signed:  Murray Hodgkins, MD [facility] 01/14/2015, 10:58 AM   I, Laban Emperor. Leonie Green, acting as scribe, recorded this note contemporaneously in the presence of Dr. Melene Plan. Sarajane Jews, M.D. on 01/14/2015 .  I have reviewed the above documentation for accuracy and completeness, and I agree with the above. Murray Hodgkins, MD

## 2015-01-14 NOTE — Progress Notes (Signed)
Patient states understanding of discharge instructions.  

## 2015-01-14 NOTE — Care Management Note (Signed)
Case Management Note  Patient Details  Name: ASHERAH LAVOY MRN: [id number] Date of Birth: [birth year]  Subjective/Objective:                    Action/Plan:   Expected Discharge Date:  01/15/15               Expected Discharge Plan:  Home/Self Care  In-House Referral:  NA  Discharge planning Services  CM Consult  Post Acute Care Choice:  NA Choice offered to:  NA  DME Arranged:    DME Agency:     HH Arranged:    [place] Agency:     Status of Service:  Completed, signed off  Medicare Important Message Given:    Date Medicare IM Given:    Medicare IM give by:    Date Additional Medicare IM Given:    Additional Medicare Important Message give by:     If discussed at [place] of Stay Meetings, dates discussed:    Additional Comments: Pt discharged home today. No CM needs noted. Christinia Gully [place], RN 01/14/2015, 12:25 PM

## 2015-01-14 NOTE — Progress Notes (Signed)
Patient ID: [name], female   DOB: [birth year], 69 y.o.   MRN: [id number]    Subjective:    No complaints  Objective:   Temp:  [97.8 F (36.6 C)-98.2 F (36.8 C)] 97.8 F (36.6 C) (08/31 0500) Pulse Rate:  [58-102] 58 (08/31 0500) Resp:  [16-18] 16 (08/31 0500) BP: (97-138)/(46-62) 97/46 mmHg (08/31 0500) SpO2:  [98 %-100 %] 98 % (08/31 0500) Last BM Date: 01/12/15  Filed Weights   01/12/15 2341 01/13/15 0300  Weight: 127 lb (57.607 kg) 129 lb (58.514 kg)    Intake/Output Summary (Last 24 hours) at 01/14/15 0859 Last data filed at 01/14/15 0500  Gross per 24 hour  Intake   1630 ml  Output      0 ml  Net   1630 ml    Telemetry: NSR, sinus brady. PACs   Exam:  General: NAD  Resp: CTAB  Cardiac: RRR, no m/r/g, no jvd  GI: abdomen soft, NT, ND  MSK:no LE edema  Neuro: no focal deficits  Psych: appropriate affect  Lab Results:  Basic Metabolic Panel:  Recent Labs Lab 01/13/15 0010 01/13/15 0327 01/13/15 0924  NA 139 140  --   K 3.9 4.0  --   CL 103 105  --   CO2 28 28  --   GLUCOSE 114* 112*  --   BUN 22* 20  --   CREATININE 0.79 0.75  --   CALCIUM 9.4 9.1  --   MG  --   --  1.9    Liver Function Tests:  Recent Labs Lab 01/13/15 0327  AST 20  ALT 14  ALKPHOS 41  BILITOT 0.3  PROT 7.0  ALBUMIN 3.8    CBC:  Recent Labs Lab 01/13/15 0010 01/13/15 0327  WBC 5.3 6.2  HGB 12.2 12.2  HCT 35.2* 35.2*  MCV 94.9 94.6  PLT 183 176    Cardiac Enzymes:  Recent Labs Lab 01/13/15 0327 01/13/15 0924 01/13/15 1518  TROPONINI <0.03 <0.03 <0.03    BNP: No results for input(s): PROBNP in the last 8760 hours.  Coagulation: No results for input(s): INR in the last 168 hours.  ECG:   Medications:   Scheduled Medications: . aspirin EC  81 mg Oral Daily  . enoxaparin (LOVENOX) injection  40 mg Subcutaneous Q24H  . levothyroxine  75 mcg Oral QAC breakfast  . metoprolol tartrate  25 mg Oral BID     Infusions: .  sodium chloride 1 mL (01/13/15 0315)     PRN Medications:  ondansetron **OR** ondansetron (ZOFRAN) IV     Assessment/Plan    1. Afib - new diagnosis this admit, presented with palpitations and left arm pain - back in NSR, on lopressor '25mg'$  bid. Some low heart rates to high 40s and soft bp's, will decrease to 12.'5mg'$  bid.  - echo LVEF 55-60%, mod LAE.  - CHADS2Vasc score of 2, we have messaged her oncologist to get thoughts on starting anticoag in the setting of her ovarian cancer. She remains hesitant about starting anticoag and wishes to stay on ASA for the time being. This can be reevaluted at f/u.   [place] for discharge. Will arrange f/u in 2 weeks.      Carlyle Dolly, M.D.

## 2015-01-14 NOTE — Progress Notes (Signed)
PROGRESS NOTE  [name] DGL:[id number] DOB: 08-24-1945 DOA: 01/12/2015 PCP: Glo Herring., MD Danielle Lenis, MD  Summary: 38 yow presented with left arm pain and palpations. In the ED found to be in new onset A. Fib with controlled heart rate. CHADS score =  2. Admitted for further evaluation and monitoring.   Assessment/Plan: 1. New onset atrial fibrillation. Converted to NSR after receiving metoprolol; decreased to 12.5 BID. Echo with normal LVEF. TSH WNL. CHADS2Vasc score of 2 (age, gender). Will continue ASA PO daily. Cardiology will confer with oncology and decide on anticoagulation as an outpatient as patient is hesitant to start anticoagulation. Continue ASA for now.   2. Chest and left arm pain. Resolved, likely secondary to palpatations. Echo revealed ejection fraction of 55%-60%. No further evaluation per cardiology. 3. Hypothyroidism. TSH WNL. Continue Synthroid. 4. Ovarian cancer.  S/p radiation and chemo, she is being followed by oncology.    Overall improved.   Continue metoprolol 12.5 mg BID  Continue ASA, reconsider anticoagulation as an outpatient. Follow-up with cardiology in 2 weeks.  Code Status: FULL DVT prophylaxis: Lovenox Family Communication: Sister bedside.  Disposition Plan: Anticipate discharge today  Murray Hodgkins, MD  [facility]  Pager [phone] If 7PM-7AM, please contact night-coverage at [email or web address], password [facility] 01/14/2015, 6:43 AM    Consultants:  Cardiology  Procedures:  ECHO - Left ventricle: The cavity size was normal. Wall thickness was normal. Systolic function was normal. The estimated ejection fraction was in the range of 55% to 60%. Wall motion was normal; there were no regional wall motion abnormalities. Left ventricular diastolic function parameters were normal. - Mitral valve: Mildly calcified annulus. There was mild regurgitation. - Left atrium: The atrium was moderately dilated.  Volume/bsa, S: 37.9 ml/m^2. - Right atrium: The atrium was mildly to moderately dilated. - Tricuspid valve: There was mild-moderate regurgitation.  Antibiotics:    HPI/Subjective: Feeling fine. No reports of chest pain, shortness of breath, n/v/d, or abd pain.  Objective: Filed Vitals:   01/13/15 0300 01/13/15 0857 01/13/15 1430 01/14/15 0500  BP: 145/66  138/62 97/46  Pulse: 171 69 102 58  Temp: 98 F (36.7 C)  98.2 F (36.8 C) 97.8 F (36.6 C)  TempSrc: Oral  Oral Oral  Resp: '18  18 16  '$ Height: '5\' 7"'$  (1.702 m)     Weight: 58.514 kg (129 lb)     SpO2: 100%  100% 98%    Intake/Output Summary (Last 24 hours) at 01/14/15 0643 Last data filed at 01/14/15 0500  Gross per 24 hour  Intake   1870 ml  Output      0 ml  Net   1870 ml     Filed Weights   01/12/15 2341 01/13/15 0300  Weight: 57.607 kg (127 lb) 58.514 kg (129 lb)    Exam: General:  Appears calm and comfortable Cardiovascular: RRR, no m/r/g. No LE edema. Telemetry: SR Respiratory: CTA bilaterally, no w/r/r. Normal respiratory effort. Psychiatric: grossly normal mood and affect, speech fluent and appropriate .  New data reviewed:  None   Pertinent data since admission:  Troponins negative  CBC unremarkable  BMP unremarkable  TSH 1.35  CXR revealed hyperinflation.   Pending data:    Scheduled Meds: . aspirin EC  81 mg Oral Daily  . enoxaparin (LOVENOX) injection  40 mg Subcutaneous Q24H  . levothyroxine  75 mcg Oral QAC breakfast  . metoprolol tartrate  25 mg Oral BID   Continuous Infusions: . sodium chloride 1  mL (01/13/15 0315)    Principal Problem:   Atrial fibrillation Active Problems:   Hypothyroidism   Chest pain    I, Jessica D. Leonie Green, acting as scribe, recorded this note contemporaneously in the presence of Dr. Melene Plan. Sarajane Jews, M.D. on 01/14/2015 .   I have reviewed the above documentation for accuracy and completeness, and I agree with the above. Murray Hodgkins, MD

## 2015-01-15 ENCOUNTER — Ambulatory Visit (HOSPITAL_BASED_OUTPATIENT_CLINIC_OR_DEPARTMENT_OTHER): Payer: Medicare Other | Admitting: Oncology

## 2015-01-15 ENCOUNTER — Telehealth: Payer: Self-pay | Admitting: Oncology

## 2015-01-15 ENCOUNTER — Other Ambulatory Visit (HOSPITAL_BASED_OUTPATIENT_CLINIC_OR_DEPARTMENT_OTHER): Payer: Medicare Other

## 2015-01-15 VITALS — BP 120/56 | HR 60 | Temp 98.4°F | Resp 17 | Ht 67.0 in | Wt 128.8 lb

## 2015-01-15 DIAGNOSIS — C792 Secondary malignant neoplasm of skin: Secondary | ICD-10-CM

## 2015-01-15 DIAGNOSIS — C569 Malignant neoplasm of unspecified ovary: Secondary | ICD-10-CM

## 2015-01-15 DIAGNOSIS — C801 Malignant (primary) neoplasm, unspecified: Secondary | ICD-10-CM

## 2015-01-15 DIAGNOSIS — C44509 Unspecified malignant neoplasm of skin of other part of trunk: Secondary | ICD-10-CM

## 2015-01-15 NOTE — Telephone Encounter (Signed)
Gave avs report and appointments for March 2017.

## 2015-01-15 NOTE — Progress Notes (Signed)
  [facility] OFFICE PROGRESS NOTE   Diagnosis: Ovarian cancer  INTERVAL HISTORY:   Ms. Davidow returns as scheduled. She was admitted 01/13/2015 with new onset atrial fibrillation. She felt well prior to this. No complaint today. She is contemplating whether to start anticoagulation therapy as recommended by cardiology.  Objective:  Vital signs in last 24 hours:  Blood pressure 120/56, pulse 60, temperature 98.4 F (36.9 C), temperature source Oral, resp. rate 17, height '5\' 7"'$  (1.702 m), weight 128 lb 12.8 oz (58.423 kg), SpO2 99 %.    HEENT: Neck without mass Lymphatics: No cervical, supraclavicular, or inguinal nodes. 1/2 cm bilateral mobile axillary nodes versus prominent fat pads Resp: Lungs clear bilaterally Cardio: Regular rate and rhythm GI: No hepatomegaly, nontender, no mass, no apparent ascites, no evidence for recurrent tumor at the low transverse incision Vascular: No leg edema   Lab Results:  CA-125 pending   Imaging:  Dg Chest 2 View  01/13/2015   CLINICAL DATA:  Palpitations, onset yesterday  EXAM: CHEST  2 VIEW  COMPARISON:  11/01/2013  FINDINGS: There is hyperinflation. The lungs are clear. The pulmonary vasculature is normal. There are no pleural effusions. Heart size is normal. Hilar and mediastinal contours are unremarkable unchanged.  IMPRESSION: Hyperinflation.   Electronically Signed   By: Andreas Newport M.D.   On: 01/13/2015 01:04    Medications: I have reviewed the patient's current medications.  Assessment/Plan: 1. 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  Staging CTs of the chest, abdomen, and pelvis with no other site of metastatic disease or a primary tumor.   Elevated CA 125.   Cycle 1 Taxol/carboplatin 08/16/2013.   Cycle 2 Taxol/carboplatin 09/06/2013.   Cycle 3 Taxol/carboplatin 09/27/2013   CT 10/11/2013 with a stable abdominal wall mass.  Status post resection of abdominal wall mass  with abdominal wall reconstruction 11/05/2013. Final pathology showed adenocarcinoma. Specimen extensively involved with adenocarcinoma which focally involved the edge of the specimen. Immunostains and Foundation 1 testing were nonspecific for a primary tumor location.  Adjuvant radiation to the abdominal wall 01/16/2014 through 02/27/2014 2. Ovarian cancer in 1988, low-grade adenocarcinoma with areas of "borderline" carcinoma, status post a hysterectomy and bilateral oophorectomy. 3. New onset atrial fibrillation August 2016   Disposition:  Ms. Fontanella appears stable. There is no evidence for progression of the presumed ovarian cancer. She continues follow-up with GYN oncology. She will return for an office visit and CA 125 in 6 months.  [name], MD  01/15/2015  9:06 AM

## 2015-01-16 ENCOUNTER — Telehealth: Payer: Self-pay | Admitting: Cardiology

## 2015-01-16 LAB — CA 125: CA 125: 8 U/mL (ref <35)

## 2015-01-16 NOTE — Telephone Encounter (Signed)
Spoke to PT, advised her that she should follow up with her PCP concerning the tingling in her hand. She voiced understanding.

## 2015-01-16 NOTE — Telephone Encounter (Signed)
Pt was an inpatient and saw Dr. Harl Bowie, she states that he told her to start taking a blood thinner, she's wanting the Rx to be sent in for that

## 2015-01-16 NOTE — Telephone Encounter (Signed)
Sx's of left hand falling asleep sensation since last night, NO other complaints.   Will message Dr Harl Bowie

## 2015-01-16 NOTE — Telephone Encounter (Signed)
This would best be addressed by her primary, symptoms do not sound to be heart related. Once evaluated by pcp if they see any heart issues we could evaluate at that time  Zandra Abts MD

## 2015-01-16 NOTE — Telephone Encounter (Signed)
Per d/c note pt to take ASA until fu visit,staff to schedule

## 2015-01-16 NOTE — Telephone Encounter (Signed)
Mrs. Capri called stating that around 930pm she started having a tingling sensation in hand. States August 31 she had pains in her left arm. Please call [phone].

## 2015-01-20 ENCOUNTER — Ambulatory Visit (INDEPENDENT_AMBULATORY_CARE_PROVIDER_SITE_OTHER): Payer: Medicare Other | Admitting: Cardiology

## 2015-01-20 ENCOUNTER — Encounter: Payer: Self-pay | Admitting: Cardiology

## 2015-01-20 VITALS — BP 106/62 | HR 51 | Ht 67.0 in | Wt 128.0 lb

## 2015-01-20 DIAGNOSIS — Z7901 Long term (current) use of anticoagulants: Secondary | ICD-10-CM | POA: Diagnosis not present

## 2015-01-20 DIAGNOSIS — I480 Paroxysmal atrial fibrillation: Secondary | ICD-10-CM | POA: Diagnosis not present

## 2015-01-20 MED ORDER — APIXABAN 5 MG PO TABS: 5.0000 mg | ORAL_TABLET | Freq: Two times a day (BID) | ORAL | Status: DC

## 2015-01-20 NOTE — Progress Notes (Signed)
Patient ID: [name], female   DOB: July 24, 1945, 69 y.o.   MRN: [id number]     Clinical Summary Ms. Remus is a 69 y.o.female seen today for follow up of the following medical problems.   1. Afib - new diagnosis during 12/2014 admission, presented with palpitations and left arm pain - converted back to NSR while admitted, discharged on low dose lopressor, higher doses caused some low heart rates and soft bp's.  - echo LVEF 55-60%, mod LAE.  - CHADS2Vasc score of 2, we discussed anticoag however she was hesitant to start in the hospital. We also wanted to f/u recs of her oncologist about its use given her history of ovarian CA. From notes appears the abdominal mass was resected 10/2013, she had received chemo 08/2013 and radiation 02/2014.   - no significant palpitations since palpitations since discharge.  -   Past Medical History  Diagnosis Date  . Ovarian cyst   . Thyroid disease   . Hypercholesteremia     under control with diet and fish oil  . GERD (gastroesophageal reflux disease)     hx of years ago  . Restless leg syndrome   . PONV (postoperative nausea and vomiting)   . 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 Anxiety     new dx  . Hx of radiation therapy 01/16/14-02/27/14    abdomen      No Known Allergies   Current Outpatient Prescriptions  Medication Sig Dispense Refill  . ALPRAZolam (XANAX) 0.25 MG tablet Take 0.25 mg by mouth at bedtime as needed.     Marland Kitchen aspirin EC 81 MG EC tablet Take 1 tablet (81 mg total) by mouth daily.    . Biotin 5000 MCG CAPS Take 1 capsule by mouth daily.     . Calcium Carb-Cholecalciferol (CALCIUM 600 + D PO) Take 1 tablet by mouth daily.    . Cholecalciferol (EQL VITAMIN D3) 1000 UNITS tablet Take 1,000 Units by mouth daily.    Marland Kitchen estrogens, conjugated, (PREMARIN) 0.625 MG tablet Take as directed (Patient taking differently: Take 0.625 mg by mouth 2 (two) times a week. Take as directed) 30 tablet 6  . levothyroxine  (SYNTHROID, LEVOTHROID) 75 MCG tablet Take 75 mcg by mouth every morning.     . metoprolol tartrate (LOPRESSOR) 25 MG tablet Take 0.5 tablets (12.5 mg total) by mouth 2 (two) times daily. 60 tablet 0  . Multiple Vitamins-Minerals (MULTIVITAMIN WITH MINERALS) tablet Take 1 tablet by mouth daily.     . Omega-3 Fatty Acids (FISH OIL) 1200 MG CAPS Take 1,200 mg by mouth daily.      No current facility-administered medications for this visit.     Past Surgical History  Procedure Laterality Date  . Oophorectomy      BSO  . Total abdominal hysterectomy  1988    ovarian cyst  BSO  . Cholecystectomy  2009  . Tonsillectomy  as child  . Laparotomy N/A 11/05/2013    Procedure: RESECTION OF PELVIC MASS;  Surgeon: Imagene Gurney A. Alycia Rossetti, MD;  Location: [facility];  Service: Gynecology;  Laterality: N/A;  . Application of a-cell of chest/abdomen N/A 11/05/2013    Procedure: ABDOMINAL WALL RESCONTRUCTION WITH STRATUS;  Surgeon: Irene Limbo, MD;  Location: [facility];  Service: Plastics;  Laterality: N/A;  . Appendectomy       No Known Allergies    Family History  Problem Relation Age of Onset  . Heart disease Mother   . Heart  disease Sister   . Diabetes Sister   . Cancer Sister     melanoma-skin cancer  . Breast cancer Paternal Grandmother     Age 37's  . Cancer Paternal Grandmother     breast  . Cancer Paternal Grandfather     prostate/bladder     Social History Ms. Gigante reports that she has never smoked. She has never used smokeless tobacco. Ms. Minogue reports that she does not drink alcohol.   Review of Systems CONSTITUTIONAL: No weight loss, fever, chills, weakness or fatigue.  HEENT: Eyes: No visual loss, blurred vision, double vision or yellow sclerae.No hearing loss, sneezing, congestion, runny nose or sore throat.  SKIN: No rash or itching.  CARDIOVASCULAR: per HPI RESPIRATORY: No shortness of breath, cough or sputum.  GASTROINTESTINAL: No anorexia, nausea, vomiting or  diarrhea. No abdominal pain or blood.  GENITOURINARY: No burning on urination, no polyuria NEUROLOGICAL: No headache, dizziness, syncope, paralysis, ataxia, numbness or tingling in the extremities. No change in bowel or bladder control.  MUSCULOSKELETAL: No muscle, back pain, joint pain or stiffness.  LYMPHATICS: No enlarged nodes. No history of splenectomy.  PSYCHIATRIC: No history of depression or anxiety.  ENDOCRINOLOGIC: No reports of sweating, cold or heat intolerance. No polyuria or polydipsia.  Marland Kitchen   Physical Examination Filed Vitals:   01/20/15 1037  BP: 106/62  Pulse: 51   Filed Vitals:   01/20/15 1037  Height: '5\' 7"'$  (1.702 m)  Weight: 128 lb (58.06 kg)    Gen: resting comfortably, no acute distress HEENT: no scleral icterus, pupils equal round and reactive, no palptable cervical adenopathy,  CV: regular, rate 51, no m/r/g, no JVD Resp: Clear to auscultation bilaterally GI: abdomen is soft, non-tender, non-distended, normal bowel sounds, no hepatosplenomegaly MSK: extremities are warm, no edema.  Skin: warm, no rash Neuro:  no focal deficits Psych: appropriate affect   Diagnostic Studies 12/2014 echo Study Conclusions  - Left ventricle: The cavity size was normal. Wall thickness was normal. Systolic function was normal. The estimated ejection fraction was in the range of 55% to 60%. Wall motion was normal; there were no regional wall motion abnormalities. Left ventricular diastolic function parameters were normal. - Mitral valve: Mildly calcified annulus. There was mild regurgitation. - Left atrium: The atrium was moderately dilated. Volume/bsa, S: 37.9 ml/m^2. - Right atrium: The atrium was mildly to moderately dilated. - Tricuspid valve: There was mild-moderate regurgitation.     Assessment and Plan   1. PAF - no current symptoms, continue metoprolol - she had decided in favor of anticoagulation, her CHADS2Vasc score is 2 (age, gender) - we  will stop her ASA, start eliquis '5mg'$  bid.   F/u 4 months   Arnoldo Lenis, MD

## 2015-01-20 NOTE — Patient Instructions (Signed)
Your physician recommends that you schedule a follow-up appointment in: 4 months with Dr. Harl Bowie  STOP ASPIRIN  START ELIQUIS 5 mg two times daily  You have been given samples of Eliquis   Thanks for choosing [facility]!!!

## 2015-01-22 ENCOUNTER — Telehealth: Payer: Self-pay | Admitting: *Deleted

## 2015-01-22 NOTE — Telephone Encounter (Signed)
Pt says she cannot afford Eliquis $160/month. Pt given month worth of samples at [facility]. Will mail pt assistance form to see if pt qualifies and f/u after determination. Pt agreeable to plan

## 2015-01-27 ENCOUNTER — Telehealth: Payer: Self-pay | Admitting: *Deleted

## 2015-01-27 MED ORDER — APIXABAN 5 MG PO TABS: 5.0000 mg | ORAL_TABLET | Freq: Two times a day (BID) | ORAL | Status: DC

## 2015-01-27 NOTE — Telephone Encounter (Signed)
Left voice message on home# to call office re: lab results

## 2015-01-27 NOTE — Telephone Encounter (Signed)
Pt assistance form received from pt and faxed to [employer]

## 2015-01-27 NOTE — Telephone Encounter (Signed)
-----   Message from Ladell Pier, MD sent at 01/16/2015  4:38 PM EDT ----- Please call patient, ca125 is normal

## 2015-01-28 ENCOUNTER — Telehealth: Payer: Self-pay | Admitting: *Deleted

## 2015-01-28 NOTE — Telephone Encounter (Signed)
Called patient after voicemail received returning Amy's call for lab results.  Notified her CA 125 = 8 and is normal.

## 2015-01-28 NOTE — Telephone Encounter (Signed)
Denial information has been faxed to the office and placed in nurse's basket for review.

## 2015-01-28 NOTE — Telephone Encounter (Signed)
Message left on vm of nurse from [facility] stating the application for patient assistance to received eliquis has been denied due to patient having medicare [employer] coverage on her insurance plan. Per Jonelle Sidle, patient can appeal this decision if she has paid out of pocket from January 2016 to present the amount of $166.21.

## 2015-02-04 ENCOUNTER — Telehealth: Payer: Self-pay | Admitting: *Deleted

## 2015-02-04 NOTE — Telephone Encounter (Signed)
Received [employer] approval for pt assistance [employer] Eliquis through 05/16/2015. # [id number]. Pt was notified of approval by letter. Medication will be shipped

## 2015-02-06 ENCOUNTER — Other Ambulatory Visit: Payer: Self-pay | Admitting: Gynecologic Oncology

## 2015-02-10 ENCOUNTER — Telehealth: Payer: Self-pay | Admitting: Cardiology

## 2015-02-10 MED ORDER — METOPROLOL TARTRATE 25 MG PO TABS: 12.5000 mg | ORAL_TABLET | Freq: Two times a day (BID) | ORAL | Status: DC

## 2015-02-10 NOTE — Telephone Encounter (Signed)
Refill complete dose is 12.5 mg twice aday

## 2015-02-10 NOTE — Telephone Encounter (Signed)
Will forward to dr Harl Bowie

## 2015-02-10 NOTE — Telephone Encounter (Signed)
Patient said Dr. Sarajane Jews prescribed her 4 weeks worth of Metoprolol and she only has 4-5 pills left. [name] is wondering if Dr. Harl Bowie wants her to continue to take this medication. If so, please call in a refill to [facility].

## 2015-02-10 NOTE — Telephone Encounter (Signed)
Yes, please refill her lopressor. Verify the dose is 12.'5mg'$  bid   Zandra Abts MD

## 2015-03-23 DIAGNOSIS — I4891 Unspecified atrial fibrillation: Secondary | ICD-10-CM | POA: Diagnosis not present

## 2015-03-23 DIAGNOSIS — Z23 Encounter for immunization: Secondary | ICD-10-CM | POA: Diagnosis not present

## 2015-03-23 DIAGNOSIS — Z1389 Encounter for screening for other disorder: Secondary | ICD-10-CM | POA: Diagnosis not present

## 2015-03-23 DIAGNOSIS — N029 Recurrent and persistent hematuria with unspecified morphologic changes: Secondary | ICD-10-CM | POA: Diagnosis not present

## 2015-03-23 DIAGNOSIS — Z7901 Long term (current) use of anticoagulants: Secondary | ICD-10-CM | POA: Diagnosis not present

## 2015-03-23 DIAGNOSIS — Z6820 Body mass index (BMI) 20.0-20.9, adult: Secondary | ICD-10-CM | POA: Diagnosis not present

## 2015-04-02 ENCOUNTER — Other Ambulatory Visit: Payer: Self-pay

## 2015-04-02 DIAGNOSIS — Z1231 Encounter for screening mammogram for malignant neoplasm of breast: Secondary | ICD-10-CM

## 2015-04-17 DIAGNOSIS — E032 Hypothyroidism due to medicaments and other exogenous substances: Secondary | ICD-10-CM | POA: Diagnosis not present

## 2015-04-17 DIAGNOSIS — E039 Hypothyroidism, unspecified: Secondary | ICD-10-CM | POA: Diagnosis not present

## 2015-04-24 ENCOUNTER — Ambulatory Visit
Admission: RE | Admit: 2015-04-24 | Discharge: 2015-04-24 | Disposition: A | Discharge status: Home / Self Care | Payer: Medicare Other | Source: Ambulatory Visit

## 2015-04-24 DIAGNOSIS — Z1231 Encounter for screening mammogram for malignant neoplasm of breast: Secondary | ICD-10-CM | POA: Diagnosis not present

## 2015-05-11 ENCOUNTER — Emergency Department (HOSPITAL_COMMUNITY)
Admission: EM | Admit: 2015-05-11 | Discharge: 2015-05-11 | Disposition: A | Discharge status: Home / Self Care | Payer: Medicare Other | Attending: Emergency Medicine | Admitting: Emergency Medicine

## 2015-05-11 ENCOUNTER — Emergency Department (HOSPITAL_COMMUNITY): Payer: Medicare Other

## 2015-05-11 ENCOUNTER — Encounter (HOSPITAL_COMMUNITY): Payer: Self-pay | Admitting: *Deleted

## 2015-05-11 DIAGNOSIS — E7800 Pure hypercholesterolemia, unspecified: Secondary | ICD-10-CM | POA: Insufficient documentation

## 2015-05-11 DIAGNOSIS — Z8543 Personal history of malignant neoplasm of ovary: Secondary | ICD-10-CM | POA: Diagnosis not present

## 2015-05-11 DIAGNOSIS — Z79899 Other long term (current) drug therapy: Secondary | ICD-10-CM | POA: Insufficient documentation

## 2015-05-11 DIAGNOSIS — E079 Disorder of thyroid, unspecified: Secondary | ICD-10-CM | POA: Insufficient documentation

## 2015-05-11 DIAGNOSIS — Z8669 Personal history of other diseases of the nervous system and sense organs: Secondary | ICD-10-CM | POA: Diagnosis not present

## 2015-05-11 DIAGNOSIS — Z7982 Long term (current) use of aspirin: Secondary | ICD-10-CM | POA: Diagnosis not present

## 2015-05-11 DIAGNOSIS — R200 Anesthesia of skin: Secondary | ICD-10-CM | POA: Diagnosis not present

## 2015-05-11 DIAGNOSIS — I10 Essential (primary) hypertension: Secondary | ICD-10-CM | POA: Diagnosis not present

## 2015-05-11 DIAGNOSIS — K219 Gastro-esophageal reflux disease without esophagitis: Secondary | ICD-10-CM | POA: Insufficient documentation

## 2015-05-11 DIAGNOSIS — Z7901 Long term (current) use of anticoagulants: Secondary | ICD-10-CM | POA: Diagnosis not present

## 2015-05-11 DIAGNOSIS — F419 Anxiety disorder, unspecified: Secondary | ICD-10-CM | POA: Diagnosis not present

## 2015-05-11 DIAGNOSIS — Z923 Personal history of irradiation: Secondary | ICD-10-CM | POA: Diagnosis not present

## 2015-05-11 DIAGNOSIS — Z8742 Personal history of other diseases of the female genital tract: Secondary | ICD-10-CM | POA: Insufficient documentation

## 2015-05-11 HISTORY — DX: Essential (primary) hypertension: I10

## 2015-05-11 LAB — DIFFERENTIAL
Basophils Absolute: 0 10*3/uL (ref 0.0–0.1)
Basophils Relative: 0 %
EOS ABS: 0 10*3/uL (ref 0.0–0.7)
EOS PCT: 1 %
LYMPHS ABS: 1.1 10*3/uL (ref 0.7–4.0)
LYMPHS PCT: 24 %
MONO ABS: 0.4 10*3/uL (ref 0.1–1.0)
Monocytes Relative: 10 %
Neutro Abs: 2.9 10*3/uL (ref 1.7–7.7)
Neutrophils Relative %: 65 %

## 2015-05-11 LAB — COMPREHENSIVE METABOLIC PANEL
ALK PHOS: 41 U/L (ref 38–126)
ALT: 21 U/L (ref 14–54)
ANION GAP: 5 (ref 5–15)
AST: 25 U/L (ref 15–41)
Albumin: 3.9 g/dL (ref 3.5–5.0)
BILIRUBIN TOTAL: 0.4 mg/dL (ref 0.3–1.2)
BUN: 16 mg/dL (ref 6–20)
CALCIUM: 9.5 mg/dL (ref 8.9–10.3)
CO2: 28 mmol/L (ref 22–32)
Chloride: 107 mmol/L (ref 101–111)
Creatinine, Ser: 0.76 mg/dL (ref 0.44–1.00)
Glucose, Bld: 108 mg/dL — ABNORMAL HIGH (ref 65–99)
Potassium: 4.4 mmol/L (ref 3.5–5.1)
Sodium: 140 mmol/L (ref 135–145)
TOTAL PROTEIN: 6.8 g/dL (ref 6.5–8.1)

## 2015-05-11 LAB — PROTIME-INR
INR: 1.15 (ref 0.00–1.49)
PROTHROMBIN TIME: 14.9 s (ref 11.6–15.2)

## 2015-05-11 LAB — CBC
HCT: 37.3 % (ref 36.0–46.0)
HEMOGLOBIN: 12.6 g/dL (ref 12.0–15.0)
MCH: 32.6 pg (ref 26.0–34.0)
MCHC: 33.8 g/dL (ref 30.0–36.0)
MCV: 96.4 fL (ref 78.0–100.0)
Platelets: 204 10*3/uL (ref 150–400)
RBC: 3.87 MIL/uL (ref 3.87–5.11)
RDW: 12.6 % (ref 11.5–15.5)
WBC: 4.5 10*3/uL (ref 4.0–10.5)

## 2015-05-11 LAB — APTT: aPTT: 27 seconds (ref 24–37)

## 2015-05-11 LAB — I-STAT TROPONIN, ED: TROPONIN I, POC: 0 ng/mL (ref 0.00–0.08)

## 2015-05-11 NOTE — ED Notes (Signed)
Patient with no complaints at this time. Respirations even and unlabored. Skin warm/dry. Discharge instructions reviewed with patient at this time. Patient given opportunity to voice concerns/ask questions. IV removed per policy and band-aid applied to site. Patient discharged at this time and left Emergency Department with steady gait.  

## 2015-05-11 NOTE — ED Notes (Signed)
Pt states she woke up at 2:30 this morning with left arm tingling that is intermittent, when she noticed it she took 2 aspirins. Pt is on a blood thinner for heart problem. Pt denies any pain and is oriented at this time. NAD noted.

## 2015-05-11 NOTE — ED Provider Notes (Signed)
History  By signing my name below, I, [name], attest that this documentation has been prepared under the direction and in the presence of [name], MD. Electronically Signed: Marlowe Stein, ED Scribe. 05/11/2015. 2:08 PM.  Chief Complaint  Patient presents with  . Numbness   The history is provided by the patient and medical records. No language interpreter was used.    HPI Comments:  [name] is a 69 y.o. female who presents to the Emergency Department complaining of an intermittent tingling sensation in the LUE from the elbow down to the tips of fingers that began about 10 hours ago. She states it felt as if her entire hand was going to sleep. She states that since waking about 5 hours ago she has only felt a mild, short numbness. She has taken two ASA tablets at the onset of the symptoms. She denies modifying factors. She denies CP, SOB, slurred speech, lower extremity numbness, tingling or weakness, HA, difficulty with balance or coordination, neck pain. She states she takes Eloquis as anticoagulant therapy for an arrhythmia.   Past Medical History  Diagnosis Date  . Ovarian cyst   . Thyroid disease   . Hypercholesteremia     under control with diet and fish oil  . GERD (gastroesophageal reflux disease)     hx of years ago  . Restless leg syndrome   . PONV (postoperative nausea and vomiting)   . Cancer ([place]) 1988, 2015    ovarian, adenocarcinoma  . Anxiety     new dx  . Hx of radiation therapy 01/16/14-02/27/14    abdomen   . Hypertension    Past Surgical History  Procedure Laterality Date  . Oophorectomy      BSO  . Total abdominal hysterectomy  1988    ovarian cyst  BSO  . Cholecystectomy  2009  . Tonsillectomy  as child  . Laparotomy N/A 11/05/2013    Procedure: RESECTION OF PELVIC MASS;  Surgeon: Imagene Gurney A. Alycia Rossetti, MD;  Location: [facility];  Service: Gynecology;  Laterality: N/A;  . Application of a-cell of chest/abdomen N/A 11/05/2013    Procedure:  ABDOMINAL WALL RESCONTRUCTION WITH STRATUS;  Surgeon: Irene Limbo, MD;  Location: [facility];  Service: Plastics;  Laterality: N/A;  . Appendectomy     Family History  Problem Relation Age of Onset  . Heart disease Mother   . Heart disease Sister   . Diabetes Sister   . Cancer Sister     melanoma-skin cancer  . Breast cancer Paternal Grandmother     Age 7's  . Cancer Paternal Grandmother     breast  . Cancer Paternal Grandfather     prostate/bladder   Social History  Substance Use Topics  . Smoking status: Never Smoker   . Smokeless tobacco: Never Used  . Alcohol Use: No   OB History    Gravida Para Term Preterm AB TAB SAB Ectopic Multiple Living   0              Review of Systems  Constitutional: Negative for fever, chills, diaphoresis, appetite change and fatigue.  HENT: Negative for mouth sores, sore throat and trouble swallowing.   Eyes: Negative for visual disturbance.  Respiratory: Negative for cough, chest tightness, shortness of breath and wheezing.   Cardiovascular: Negative for chest pain.  Gastrointestinal: Negative for nausea, vomiting, abdominal pain, diarrhea and abdominal distention.  Endocrine: Negative for polydipsia, polyphagia and polyuria.  Genitourinary: Negative for dysuria, frequency and hematuria.  Musculoskeletal: Negative for gait problem.  Skin: Negative for color change, pallor and rash.  Neurological: Positive for numbness. Negative for dizziness, syncope, light-headedness and headaches.  Hematological: Does not bruise/bleed easily.  Psychiatric/Behavioral: Negative for behavioral problems and confusion.    Allergies  Review of patient's allergies indicates no known allergies.  Home Medications   Prior to Admission medications   Medication Sig Start Date End Date Taking? Authorizing Provider  ALPRAZolam Duanne Moron) 0.25 MG tablet Take 0.25 mg by mouth at bedtime as needed for sleep.  08/27/14  Yes Historical Provider, MD  apixaban  (ELIQUIS) 5 MG TABS tablet Take 1 tablet (5 mg total) by mouth 2 (two) times daily. 01/27/15  Yes Arnoldo Lenis, MD  aspirin 325 MG tablet Take 325 mg by mouth every 4 (four) hours as needed for mild pain.   Yes Historical Provider, MD  Calcium Carb-Cholecalciferol (CALCIUM 600 + D PO) Take 1 tablet by mouth daily.   Yes Historical Provider, MD  Cholecalciferol (EQL VITAMIN D3) 1000 UNITS tablet Take 1,000 Units by mouth daily.   Yes Historical Provider, MD  levothyroxine (SYNTHROID, LEVOTHROID) 75 MCG tablet Take 75 mcg by mouth every morning.    Yes Historical Provider, MD  metoprolol tartrate (LOPRESSOR) 25 MG tablet Take 0.5 tablets (12.5 mg total) by mouth 2 (two) times daily. 02/10/15  Yes Arnoldo Lenis, MD  Multiple Vitamins-Minerals (MULTIVITAMIN WITH MINERALS) tablet Take 1 tablet by mouth daily.    Yes Historical Provider, MD  Omega-3 Fatty Acids (FISH OIL) 1200 MG CAPS Take 1,200 mg by mouth daily.    Yes Historical Provider, MD  PREMARIN 0.625 MG tablet TAKE AS DIRECTED. Patient taking differently: TAKE 1 TABLET TWICE WEEKLY ([place]). 02/06/15  Yes Dorothyann Gibbs, NP   Triage Vitals: BP 149/69 mmHg  Pulse 73  Temp(Src) 98 F (36.7 C) (Oral)  Resp 16  Ht '5\' 7"'$  (1.702 m)  Wt 130 lb (58.968 kg)  BMI 20.36 kg/m2  SpO2 100% Physical Exam  Constitutional: She is oriented to person, place, and time. She appears well-developed and well-nourished. No distress.  HENT:  Head: Normocephalic.  Eyes: Conjunctivae are normal. Pupils are equal, round, and reactive to light. No scleral icterus.  Neck: Normal range of motion. Neck supple. Carotid bruit is not present. No thyromegaly present.  Cardiovascular: Normal rate and regular rhythm.  Exam reveals no gallop and no friction rub.   No murmur heard. Pulmonary/Chest: Effort normal and breath sounds normal. No respiratory distress. She has no wheezes. She has no rales.  Abdominal: Soft. Bowel sounds are normal. She exhibits  no distension. There is no tenderness. There is no rebound.  Musculoskeletal: Normal range of motion.  Neurological: She is alert and oriented to person, place, and time. No cranial nerve deficit.  No pronator drift.  Skin: Skin is warm and dry. No rash noted.  Psychiatric: She has a normal mood and affect. Her behavior is normal.  Nursing note and vitals reviewed.   ED Course  Procedures (including critical care time) DIAGNOSTIC STUDIES: Oxygen Saturation is 100% on RA, normal by my interpretation.   COORDINATION OF CARE: 12:47 PM- Will CT head. Pt verbalizes understanding and agrees to plan.  Medications - No data to display  Labs Review Labs Reviewed  COMPREHENSIVE METABOLIC PANEL - Abnormal; Notable for the following:    Glucose, Bld 108 (*)    All other components within normal limits  PROTIME-INR  APTT  CBC  DIFFERENTIAL  I-STAT  [place], ED    Imaging Review Ct Head Wo Contrast  05/11/2015  CLINICAL DATA:  Intermittent tingling sensation in the left upper extremity extending from the elbow in to the fingers for 10 hours. No known injury. EXAM: CT HEAD WITHOUT CONTRAST TECHNIQUE: Contiguous axial images were obtained from the base of the skull through the vertex without intravenous contrast. COMPARISON:  None. FINDINGS: There is no evidence of acute intracranial hemorrhage, mass lesion, brain edema or extra-axial fluid collection. The ventricles and subarachnoid spaces are appropriately sized for age. There is no CT evidence of acute cortical infarction. There is mild periventricular white matter disease. Intracranial vascular calcifications are noted. The visualized paranasal sinuses, mastoid air cells and middle ears are clear. The calvarium is intact. IMPRESSION: 1. No acute findings or explanation for the patient's symptoms. 2. Mild periventricular white matter disease, likely due to chronic small vessel ischemic changes. Electronically Signed   By: Richardean Sale M.D.    On: 05/11/2015 13:27   I have personally reviewed and evaluated these images and lab results as part of my medical decision-making.   EKG Interpretation None      MDM   Final diagnoses:  Numbness    Symptoms do not seem neurological. There were transient and resolved with simply shaking her arm. Very likely a positional neurapraxia. Is anticoagulated with Xarelto and has an EKG without acute changes. In particular, no hemorrhage. I discussed with her follow-up with her physician if these episodes continue to happen. If they persist more than a few minutes were worsened to involve weakness or other extremities vision speech or any other change, I have asked her to recheck here immediately.    [name], MD 05/11/15 1409

## 2015-05-11 NOTE — Discharge Instructions (Signed)
Follow-up with your physician if these intermittent episodes of numbness continue to happen.  Return to ER with any worsening of these symptoms.  Continue your medications at their current dosages.

## 2015-06-16 ENCOUNTER — Ambulatory Visit (INDEPENDENT_AMBULATORY_CARE_PROVIDER_SITE_OTHER): Payer: Medicare Other | Admitting: Cardiology

## 2015-06-16 ENCOUNTER — Encounter: Payer: Self-pay | Admitting: Cardiology

## 2015-06-16 VITALS — BP 108/66 | HR 61 | Ht 67.0 in | Wt 132.0 lb

## 2015-06-16 DIAGNOSIS — Z7901 Long term (current) use of anticoagulants: Secondary | ICD-10-CM | POA: Diagnosis not present

## 2015-06-16 DIAGNOSIS — I480 Paroxysmal atrial fibrillation: Secondary | ICD-10-CM | POA: Diagnosis not present

## 2015-06-16 NOTE — Progress Notes (Signed)
Patient ID: [name], female   DOB: Jun 04, 1945, 70 y.o.   MRN: [id number]     Clinical Summary [name] is a 70 y.o.female seen today for follow up of the following medical problems.   1. Afib - new diagnosis during 12/2014 admission, presented with palpitations and left arm pain - converted back to NSR while admitted, discharged on low dose lopressor, higher doses caused some low heart rates and soft bp's.  - echo LVEF 55-60%, mod LAE.  .   - no significant palpitations since palpitations since discharge. Tolerating eliquis well without any bleeding issues.      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  Past Medical History  Diagnosis Date  . Ovarian cyst   . Thyroid disease   . Hypercholesteremia     under control with diet and fish oil  . GERD (gastroesophageal reflux disease)     hx of years ago  . Restless leg syndrome   . PONV (postoperative nausea and vomiting)   . Cancer ([place]) 1988, 2015    ovarian, adenocarcinoma  . Anxiety     new dx  . Hx of radiation therapy 01/16/14-02/27/14    abdomen   . Hypertension      No Known Allergies   Current Outpatient Prescriptions  Medication Sig Dispense Refill  . ALPRAZolam (XANAX) 0.25 MG tablet Take 0.25 mg by mouth at bedtime as needed for sleep.     Marland Kitchen apixaban (ELIQUIS) 5 MG TABS tablet Take 1 tablet (5 mg total) by mouth 2 (two) times daily. 180 tablet 3  . aspirin 325 MG tablet Take 325 mg by mouth every 4 (four) hours as needed for mild pain.    . Calcium Carb-Cholecalciferol (CALCIUM 600 + D PO) Take 1 tablet by mouth daily.    . Cholecalciferol (EQL VITAMIN D3) 1000 UNITS tablet Take 1,000 Units by mouth daily.    Marland Kitchen levothyroxine (SYNTHROID, LEVOTHROID) 75 MCG tablet Take 75 mcg by mouth every morning.     . metoprolol tartrate (LOPRESSOR) 25 MG tablet Take 0.5 tablets (12.5 mg total) by mouth 2 (two) times daily. 60 tablet 3  . Multiple Vitamins-Minerals (MULTIVITAMIN WITH MINERALS) tablet Take 1  tablet by mouth daily.     . Omega-3 Fatty Acids (FISH OIL) 1200 MG CAPS Take 1,200 mg by mouth daily.     Marland Kitchen PREMARIN 0.625 MG tablet TAKE AS DIRECTED. (Patient taking differently: TAKE 1 TABLET TWICE WEEKLY ([place]).) 30 tablet 6   No current facility-administered medications for this visit.     Past Surgical History  Procedure Laterality Date  . Oophorectomy      BSO  . Total abdominal hysterectomy  1988    ovarian cyst  BSO  . Cholecystectomy  2009  . Tonsillectomy  as child  . Laparotomy N/A 11/05/2013    Procedure: RESECTION OF PELVIC MASS;  Surgeon: Imagene Gurney A. Alycia Rossetti, MD;  Location: [facility];  Service: Gynecology;  Laterality: N/A;  . Application of a-cell of chest/abdomen N/A 11/05/2013    Procedure: ABDOMINAL WALL RESCONTRUCTION WITH STRATUS;  Surgeon: Irene Limbo, MD;  Location: [facility];  Service: Plastics;  Laterality: N/A;  . Appendectomy       No Known Allergies    Family History  Problem Relation Age of Onset  . Heart disease Mother   . Heart disease Sister   . Diabetes Sister   . Cancer Sister     melanoma-skin cancer  . Breast cancer Paternal  Grandmother     Age 64's  . Cancer Paternal Grandmother     breast  . Cancer Paternal Grandfather     prostate/bladder     Social History [name] reports that she has never smoked. She has never used smokeless tobacco. [name] reports that she does not drink alcohol.   Review of Systems CONSTITUTIONAL: No weight loss, fever, chills, weakness or fatigue.  HEENT: Eyes: No visual loss, blurred vision, double vision or yellow sclerae.No hearing loss, sneezing, congestion, runny nose or sore throat.  SKIN: No rash or itching.  CARDIOVASCULAR: per hpi RESPIRATORY: No shortness of breath, cough or sputum.  GASTROINTESTINAL: No anorexia, nausea, vomiting or diarrhea. No abdominal pain or blood.  GENITOURINARY: No burning on urination, no polyuria NEUROLOGICAL: No headache, dizziness, syncope,  paralysis, ataxia, numbness or tingling in the extremities. No change in bowel or bladder control.  MUSCULOSKELETAL: No muscle, back pain, joint pain or stiffness.  LYMPHATICS: No enlarged nodes. No history of splenectomy.  PSYCHIATRIC: No history of depression or anxiety.  ENDOCRINOLOGIC: No reports of sweating, cold or heat intolerance. No polyuria or polydipsia.  Marland Kitchen   Physical Examination Filed Vitals:   06/16/15 1311  BP: 108/66  Pulse: 61   Filed Vitals:   06/16/15 1311  Height: '5\' 7"'$  (1.702 m)  Weight: 132 lb (59.875 kg)    Gen: resting comfortably, no acute distress HEENT: no scleral icterus, pupils equal round and reactive, no palptable cervical adenopathy,  CV: RRR, no m/r/g, no jvd Resp: Clear to auscultation bilaterally GI: abdomen is soft, non-tender, non-distended, normal bowel sounds, no hepatosplenomegaly MSK: extremities are warm, no edema.  Skin: warm, no rash Neuro:  no focal deficits Psych: appropriate affect   Diagnostic Studies 12/2014 echo Study Conclusions  - Left ventricle: The cavity size was normal. Wall thickness was normal. Systolic function was normal. The estimated ejection fraction was in the range of 55% to 60%. Wall motion was normal; there were no regional wall motion abnormalities. Left ventricular diastolic function parameters were normal. - Mitral valve: Mildly calcified annulus. There was mild regurgitation. - Left atrium: The atrium was moderately dilated. Volume/bsa, S: 37.9 ml/m^2. - Right atrium: The atrium was mildly to moderately dilated. - Tricuspid valve: There was mild-moderate regurgitation.    Assessment and Plan   1. PAF - no current symptoms, continue current meds including eliquis for stroke prevention  F/u 6 months      Arnoldo Lenis, M.D.

## 2015-06-16 NOTE — Patient Instructions (Signed)
Continue all current medications. Your physician wants you to follow up in: 6 months.  You will receive a reminder letter in the mail one-two months in advance.  If you don't receive a letter, please call our office to schedule the follow up appointment   

## 2015-06-17 ENCOUNTER — Encounter: Payer: Self-pay | Admitting: *Deleted

## 2015-07-16 ENCOUNTER — Telehealth: Payer: Self-pay | Admitting: Oncology

## 2015-07-16 ENCOUNTER — Other Ambulatory Visit (HOSPITAL_BASED_OUTPATIENT_CLINIC_OR_DEPARTMENT_OTHER): Payer: Medicare Other

## 2015-07-16 ENCOUNTER — Ambulatory Visit (HOSPITAL_BASED_OUTPATIENT_CLINIC_OR_DEPARTMENT_OTHER): Payer: Medicare Other | Admitting: Oncology

## 2015-07-16 VITALS — BP 122/52 | HR 58 | Temp 97.1°F | Resp 18 | Ht 67.0 in | Wt 134.6 lb

## 2015-07-16 DIAGNOSIS — C44509 Unspecified malignant neoplasm of skin of other part of trunk: Secondary | ICD-10-CM | POA: Diagnosis not present

## 2015-07-16 DIAGNOSIS — I4891 Unspecified atrial fibrillation: Secondary | ICD-10-CM | POA: Diagnosis not present

## 2015-07-16 DIAGNOSIS — C569 Malignant neoplasm of unspecified ovary: Secondary | ICD-10-CM | POA: Diagnosis not present

## 2015-07-16 DIAGNOSIS — C792 Secondary malignant neoplasm of skin: Secondary | ICD-10-CM | POA: Diagnosis not present

## 2015-07-16 NOTE — Progress Notes (Signed)
  [facility] OFFICE PROGRESS NOTE   Diagnosis: Ovarian cancer  INTERVAL HISTORY:   Ms. Corona returns as scheduled. She feels well. She is maintained on eliquis after being diagnosed with atrial fibrillation. Good appetite. She is working part-time.  Objective:  Vital signs in last 24 hours:  Blood pressure 122/52, pulse 58, temperature 97.1 F (36.2 C), temperature source Oral, resp. rate 18, height '5\' 7"'$  (1.702 m), weight 134 lb 9.6 oz (61.054 kg), SpO2 100 %.    HEENT: Neck without mass Lymphatics: No cervical, supra-clavicular, axillary, or inguinal nodes Resp: Lungs clear bilaterally Cardio: Regular rate and rhythm GI: No hepatomegaly, no apparent ascites, no mass, nontender, low transverse incision without evidence of recurrent tumor Vascular: No leg edema, bilateral lower leg varicosities   Lab Results: CA 125 pending   Medications: I have reviewed the patient's current medications.  Assessment/Plan: 1. 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  Staging CTs of the chest, abdomen, and pelvis with no other site of metastatic disease or a primary tumor.   Elevated CA 125.   Cycle 1 Taxol/carboplatin 08/16/2013.   Cycle 2 Taxol/carboplatin 09/06/2013.   Cycle 3 Taxol/carboplatin 09/27/2013   CT 10/11/2013 with a stable abdominal wall mass.  Status post resection of abdominal wall mass with abdominal wall reconstruction 11/05/2013. Final pathology showed adenocarcinoma. Specimen extensively involved with adenocarcinoma which focally involved the edge of the specimen. Immunostains and Foundation 1 testing were nonspecific for a primary tumor location.  Adjuvant radiation to the abdominal wall 01/16/2014 through 02/27/2014 2. Ovarian cancer in 1988, low-grade adenocarcinoma with areas of "borderline" carcinoma, status post a hysterectomy and bilateral oophorectomy. 3. New onset atrial fibrillation August 2016, maintained on  eliquis   Disposition:  [name] remains in clinical remission from the metastatic adenocarcinoma. We will follow-up on the CA 125 from today.  We will refer her for a follow-up GYN oncology visit in 4 months. She will return for an eight-month office visit in medical oncology.   [name], MD  07/16/2015  12:21 PM

## 2015-07-16 NOTE — Telephone Encounter (Signed)
Gave and printed appt sched and avs for pt for NOV...lvm for GYN to sched appt with Dr. Alycia Rossetti

## 2015-07-17 DIAGNOSIS — E032 Hypothyroidism due to medicaments and other exogenous substances: Secondary | ICD-10-CM | POA: Diagnosis not present

## 2015-07-17 DIAGNOSIS — E039 Hypothyroidism, unspecified: Secondary | ICD-10-CM | POA: Diagnosis not present

## 2015-07-17 LAB — CA 125: Cancer Antigen (CA) 125: 17.3 U/mL (ref 0.0–38.1)

## 2015-07-17 LAB — CANCER ANTIGEN 125 (PARALLEL TESTING): CA 125: 9 U/mL (ref <35)

## 2015-07-20 ENCOUNTER — Telehealth: Payer: Self-pay | Admitting: *Deleted

## 2015-07-20 NOTE — Telephone Encounter (Signed)
-----   Message from Ladell Pier, MD sent at 07/17/2015  5:57 PM EST ----- Please call patient, ca125 is normal

## 2015-07-20 NOTE — Telephone Encounter (Signed)
Left message on voicemail for pt to call office for lab results. Normal, per MD.

## 2015-07-20 NOTE — Telephone Encounter (Signed)
Left message on pt's voicemail with future appointment information. Pt is scheduled to see Dr. Alycia Rossetti on 11/11/15 @ 1:15pm

## 2015-08-14 DIAGNOSIS — H25099 Other age-related incipient cataract, unspecified eye: Secondary | ICD-10-CM | POA: Diagnosis not present

## 2015-08-14 DIAGNOSIS — H524 Presbyopia: Secondary | ICD-10-CM | POA: Diagnosis not present

## 2015-08-14 DIAGNOSIS — H52222 Regular astigmatism, left eye: Secondary | ICD-10-CM | POA: Diagnosis not present

## 2015-08-14 DIAGNOSIS — H5213 Myopia, bilateral: Secondary | ICD-10-CM | POA: Diagnosis not present

## 2015-08-18 ENCOUNTER — Other Ambulatory Visit: Payer: Self-pay | Admitting: Cardiology

## 2015-08-18 ENCOUNTER — Telehealth: Payer: Self-pay | Admitting: Cardiology

## 2015-08-18 MED ORDER — APIXABAN 5 MG PO TABS: 5.0000 mg | ORAL_TABLET | Freq: Two times a day (BID) | ORAL | Status: DC

## 2015-08-18 NOTE — Addendum Note (Signed)
Addended by: Merlene Laughter on: 08/18/2015 03:28 PM   Modules accepted: Orders

## 2015-08-18 NOTE — Telephone Encounter (Signed)
Refill:  Needs refill on apixaban (ELIQUIS) 5 MG  [place]

## 2015-08-18 NOTE — Telephone Encounter (Signed)
[name] called requesting samples of Eliquis. Please call her cell [phone]

## 2015-08-19 MED ORDER — APIXABAN 5 MG PO TABS: 5.0000 mg | ORAL_TABLET | Freq: Two times a day (BID) | ORAL | Status: DC

## 2015-08-19 NOTE — Telephone Encounter (Signed)
Patient advised that samples available for pick up.

## 2015-08-26 ENCOUNTER — Ambulatory Visit: Payer: Medicare Other | Admitting: Gynecologic Oncology

## 2015-09-08 ENCOUNTER — Other Ambulatory Visit: Payer: Self-pay | Admitting: Cardiology

## 2015-10-15 NOTE — Telephone Encounter (Signed)
Called patient to see if she still needed the samples she requested on 08/18/15. Patient said that she no longer needed them and we could restock them. Eliquis samples put back on shelf.

## 2015-10-23 DIAGNOSIS — Z6820 Body mass index (BMI) 20.0-20.9, adult: Secondary | ICD-10-CM | POA: Diagnosis not present

## 2015-10-23 DIAGNOSIS — Z0000 Encounter for general adult medical examination without abnormal findings: Secondary | ICD-10-CM | POA: Diagnosis not present

## 2015-11-06 DIAGNOSIS — E032 Hypothyroidism due to medicaments and other exogenous substances: Secondary | ICD-10-CM | POA: Diagnosis not present

## 2015-11-06 DIAGNOSIS — E039 Hypothyroidism, unspecified: Secondary | ICD-10-CM | POA: Diagnosis not present

## 2015-11-11 ENCOUNTER — Ambulatory Visit: Payer: Medicare Other | Attending: Gynecologic Oncology | Admitting: Gynecologic Oncology

## 2015-11-11 ENCOUNTER — Other Ambulatory Visit: Payer: Self-pay | Admitting: Gynecologic Oncology

## 2015-11-11 ENCOUNTER — Encounter: Payer: Self-pay | Admitting: Gynecologic Oncology

## 2015-11-11 ENCOUNTER — Other Ambulatory Visit: Payer: Medicare Other

## 2015-11-11 VITALS — BP 105/56 | HR 58 | Temp 98.3°F | Resp 18 | Ht 67.0 in | Wt 135.2 lb

## 2015-11-11 DIAGNOSIS — N83209 Unspecified ovarian cyst, unspecified side: Secondary | ICD-10-CM | POA: Diagnosis not present

## 2015-11-11 DIAGNOSIS — C569 Malignant neoplasm of unspecified ovary: Secondary | ICD-10-CM | POA: Diagnosis not present

## 2015-11-11 DIAGNOSIS — Z803 Family history of malignant neoplasm of breast: Secondary | ICD-10-CM | POA: Insufficient documentation

## 2015-11-11 DIAGNOSIS — E7800 Pure hypercholesterolemia, unspecified: Secondary | ICD-10-CM | POA: Insufficient documentation

## 2015-11-11 DIAGNOSIS — F419 Anxiety disorder, unspecified: Secondary | ICD-10-CM | POA: Diagnosis not present

## 2015-11-11 DIAGNOSIS — I10 Essential (primary) hypertension: Secondary | ICD-10-CM | POA: Diagnosis not present

## 2015-11-11 DIAGNOSIS — Z90710 Acquired absence of both cervix and uterus: Secondary | ICD-10-CM | POA: Insufficient documentation

## 2015-11-11 DIAGNOSIS — C801 Malignant (primary) neoplasm, unspecified: Secondary | ICD-10-CM

## 2015-11-11 DIAGNOSIS — C562 Malignant neoplasm of left ovary: Secondary | ICD-10-CM | POA: Diagnosis not present

## 2015-11-11 DIAGNOSIS — Z923 Personal history of irradiation: Secondary | ICD-10-CM | POA: Diagnosis not present

## 2015-11-11 DIAGNOSIS — Z808 Family history of malignant neoplasm of other organs or systems: Secondary | ICD-10-CM | POA: Diagnosis not present

## 2015-11-11 DIAGNOSIS — K219 Gastro-esophageal reflux disease without esophagitis: Secondary | ICD-10-CM | POA: Diagnosis not present

## 2015-11-11 DIAGNOSIS — Z8249 Family history of ischemic heart disease and other diseases of the circulatory system: Secondary | ICD-10-CM | POA: Insufficient documentation

## 2015-11-11 DIAGNOSIS — Z9221 Personal history of antineoplastic chemotherapy: Secondary | ICD-10-CM | POA: Diagnosis not present

## 2015-11-11 DIAGNOSIS — Z90722 Acquired absence of ovaries, bilateral: Secondary | ICD-10-CM | POA: Insufficient documentation

## 2015-11-11 DIAGNOSIS — K5730 Diverticulosis of large intestine without perforation or abscess without bleeding: Secondary | ICD-10-CM | POA: Insufficient documentation

## 2015-11-11 DIAGNOSIS — E079 Disorder of thyroid, unspecified: Secondary | ICD-10-CM | POA: Diagnosis not present

## 2015-11-11 DIAGNOSIS — G2581 Restless legs syndrome: Secondary | ICD-10-CM | POA: Diagnosis not present

## 2015-11-11 DIAGNOSIS — Z833 Family history of diabetes mellitus: Secondary | ICD-10-CM | POA: Diagnosis not present

## 2015-11-11 DIAGNOSIS — Z9049 Acquired absence of other specified parts of digestive tract: Secondary | ICD-10-CM | POA: Diagnosis not present

## 2015-11-11 DIAGNOSIS — Z7901 Long term (current) use of anticoagulants: Secondary | ICD-10-CM | POA: Insufficient documentation

## 2015-11-11 DIAGNOSIS — Z8042 Family history of malignant neoplasm of prostate: Secondary | ICD-10-CM | POA: Diagnosis not present

## 2015-11-11 MED ORDER — HYDROCODONE-ACETAMINOPHEN 5-325 MG PO TABS: 1.0000 | ORAL_TABLET | Freq: Four times a day (QID) | ORAL | Status: DC | PRN

## 2015-11-11 NOTE — Patient Instructions (Signed)
Please follow-up with Dr. Benay Spice in November as scheduled and return to see me approximately 3-4 months after that. We will notify you of the results of your bloodwork from today.

## 2015-11-11 NOTE — Progress Notes (Signed)
Consult Note: Gyn-Onc  [name] 70 y.o. female  CC:  Chief Complaint  Patient presents with  . Follow-up    HPI:  Patient 70 year old gravida 0 who in 1988 underwent a total dominant hysterectomy bilateral salpingo-oophorectomy by Dr. Ree Edman. 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 Her washings were negative. CA 125 at the time of diagnosis was 33. She's been doing well without significant complaints since that time. She's had excellent routine medical care. For the past several months she felt that her stomach "old age" in the shower and felt that she was gaining weight. She scratched her abdomen felt that she felt a mass and had some discomfort and she went to see her primary care physician Dr. Gerarda Fraction. A CT scan of the chest abdomen and pelvis was ordered on 08/06/2013 but revealed:  FINDINGS:  Negative lung bases. No pericardial or pleural effusion. No acute osseous abnormality identified. Advanced L4-L5 disc degeneration. Lobulated soft tissue mass arising just to the right of midline in the lower abdomen and suprapubic region, inseparable from both caudal rectus muscles, more so the right. Approaching the mass the right rectus muscle is expanded (series 2, image 57. The mass is multilobulated and encompasses 55 x 79 x 97 cm.There is a narrow fat plane between the mass and underlying small bowel and bladder within the pelvis. No inguinal or pelvic sidewall lymphadenopathy. No pelvic free fluid. Uterus and adnexa not identified and appear to be surgically absent. Negative distal colon except for retained stool and diverticulosis. Left colon, transverse colon, right colon within normal limits. Appendix not identified. Oral contrast has almost reached the terminal ileum. No dilated small bowel. Fairly decompressed stomach. Negative duodenum. Surgically absent gallbladder. Mild intra and  extrahepatic biliary ductal enlargement likely postoperative. Normal liver enhancement. Spleen, pancreas, and adrenal glands are normal. Portal venous system within normal limits. Major arterial structures in the abdomen and pelvis are patent. Aortoiliac calcified atherosclerosis noted. No abdominal free fluid. No lymphadenopathy. Kidneys within normal limits; small left upper pole low-density area with simple fluid densitometry.  IMPRESSION:  1. Lower abdominal/suprapubic ventral wall lobulated soft tissue mass, arising within the lower rectus musculature more so on the right. 5.5 x 7.9 x 9.7 cm. Favor sarcoma or other connective tissue tumor. This should be amenable to percutaneous biopsy. In a younger female patient, endometriosis within a Cesarean section scar would also be a consideration for this appearance.  2. Narrow fat plane between the mass in #1 and underlying small bowel and bladder. No lymphadenopathy. No ascites.  3. Surgically absent gallbladder, uterus, and adnexa. Diverticulosis of the colon.  She underwent a core biopsy of the lesion on March 17 that revealed: Diagnosis Soft Tissue Needle Core Biopsy, abdominal wall mass - METASTATIC ADENOCARCINOMA INVOLVING SOFT TISSUE, SEE COMMENT. Microscopic Comment Needle core biopsies demonstrate diffuse involvement by well differentiated adenocarcinoma. The adenocarcinoma has the following immunophenotype: Cytokeratin 7 - strong diffuse expression Cytokeratin 20 - negative expression CDX2 - negative expression TTF-1 - negative expression Estrogen receptor - negative expression Vimentin - patchy mild to moderate expression The clinical history of hysterectomy for "possible endometrial cancer" is noted. The morphology and immunophenotype of the current biopsies is consistent with metastatic adenocarcinoma. Given the clinical history, metastasis from a gynecological primary is a consideration. However, metastasis from a primary lung, upper  gastrointestinal tract or pancreatic-biliary tumor is not definitively excluded.  With a  biopsy was not definitive, given her past history as well no other primary site was felt that this very well could be a recurrence of her primary ovarian tumor. Use in the area of her prior transverse skin incision. There is a discussion with Dr. Barry Dienes, Dr. Benay Spice, plastic surgery, and myself it was felt that it would be a fairly radical dissection to proceed with surgery at this time and the decision was made to approach her with neoadjuvant chemotherapy. Her CA 125 was elevated at the time of cycle #1 of chemotherapy at 128. She's another cycle of chemotherapy scheduled for April 24. She's overall tolerating her chemotherapy quite well. She had diarrhea for one day presented by his been feeling well. She is up-to-date on her mammograms. Her colonoscopy 2-3 years ago and was recommended she'll follow up in 5 years. Family history significant for paternal grandmother with breast cancer in her 43s. She has 2 sisters who have had melanoma felt to be due to sun exposure. The patient herself does see a dermatologist.  She's completed her third cycle of paclitaxel and carboplatin on Sep 28 2011. Her CA 125 at that time was 147 which is not significantly changed from her pre-chemotherapy value of 128. She had a CT scan that revealed: FINDINGS:  Lung bases show no acute findings. Heart size normal. No pericardial or pleural effusion. Liver is unremarkable. Cholecystectomy with stable intrahepatic and extrahepatic biliary duct prominence. Adrenal glands and right kidney are unremarkable. Low-attenuation lesions in the left kidney measure up to 1.4 cm, as before and are likely cysts. Kidneys, spleen, pancreas, stomach and bowel are unremarkable. A heterogeneous mass centered in the inferior right rectus abdominus muscle measures 5.3 x 7.5 cm (previously 5.5 x 7.9 cm). No pathologically enlarged lymph nodes. Scattered  atherosclerotic calcification of the arterial vasculature without abdominal aortic aneurysm. No free fluid. No worrisome lytic or sclerotic lesions.  Degenerative changes are seen in the spine.  IMPRESSION:  Right rectus abdominus mass is stable to very minimally smaller than on 07/19/2013.  Surgery 11/05/13: Findings Operative findings: 8 involving the subcutaneous tissues, fascia, and rectus on the right side. No obvious intraperitoneal disease. Normal-appearing omentum    Biopsies of (if applicable) revealed: Diagnosis 11/05/2013:  Soft tissue mass, simple excision, abdominal wall mass  ADENOCARCINOMA.  Microscopic Comment  The specimen is extensively involved by adenocarcinoma which focally involves the edge of the specimen. Immunohistochemistry is performed and the tumor shows patchy positivity with Napsin-A and is negative with WT-1, estrogen receptor, progesterone receptor, gross disease cystic fluid protein, CDX-2, carcinoembryonic antigen, thyroid transcription factor-1, CD10 and CD117. The immunophenotype is nonspecific and additional immunohistochemistry will be performed and reported as an addendum. (JDP:kh 11/07/13) ADDENDUM: Additional immunohistochemistry is performed and the tumor is positive with cytokeratin AE1/AE3 and cytokeratin 7 and is negative with Calretinin, cytokeratin 5/6 and cytokeratin 20. The immunoreactivity is not specific for location of a primary tumor.   She underwent some testing with Foundation 1. 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 She underwent tumor directed radiation therapy which she completed October 15. She saw Dr. Benay Spice in  March Her CA-125 at that time was 17. She comes in today for follow-up. She's overall doing very well. The biggest change is she was diagnosed with intermittent atrial fibrillation about [place]. She's not having any issues with it. She does complain of some restless legs at night and takes hydrocodone for this intermittently. She  would like refills for this. She was given a prescription for  medication specifically for restless legs did not want to take it secondary to fear of the side effect profile. There are no new medical problems and her family. She is up-to-date on her mammograms.  Review of Systems  Constitutional: Denies fever. Skin: No rash, sores, jaundice, itching, or dryness.  Cardiovascular: No chest pain, shortness of breath, or edema  Pulmonary: No cough or wheeze.  Gastro Intestinal: No nausea, vomiting, constipation, or diarrhea reported. No bright red blood per rectum or change in bowel movement.  Genitourinary: No frequency, urgency, or dysuria. + SUI. Denies vaginal bleeding and discharge.  Musculoskeletal: No myalgia, arthralgia Neurologic: No weakness, numbness, or change in gait. + restless legs Psychology: No complaints   Current Meds:  Outpatient Encounter Prescriptions as of 11/11/2015  Medication Sig  . ALPRAZolam (XANAX) 0.25 MG tablet Take 0.25 mg by mouth at bedtime as needed for sleep.   Marland Kitchen apixaban (ELIQUIS) 5 MG TABS tablet Take 1 tablet (5 mg total) by mouth 2 (two) times daily.  . Calcium Carb-Cholecalciferol (CALCIUM 600 + D PO) Take 1 tablet by mouth daily.  . Cholecalciferol (EQL VITAMIN D3) 1000 UNITS tablet Take 1,000 Units by mouth daily.  Marland Kitchen levothyroxine (SYNTHROID, LEVOTHROID) 75 MCG tablet Take 75 mcg by mouth every morning.   . metoprolol tartrate (LOPRESSOR) 25 MG tablet TAKE (1/2) TABLET BY MOUTH TWICE DAILY.  . Multiple Vitamins-Minerals (MULTIVITAMIN WITH MINERALS) tablet Take 1 tablet by mouth daily.   . Omega-3 Fatty Acids (FISH OIL) 1200 MG CAPS Take 1,200 mg by mouth daily.   Marland Kitchen PREMARIN 0.625 MG tablet TAKE AS DIRECTED. (Patient taking differently: TAKE 1 TABLET TWICE WEEKLY ([place]).)   No facility-administered encounter medications on file as of 11/11/2015.    Allergy: No Known Allergies  Social Hx:   Social History   Social History  . Marital  Status: Single    Spouse Name: N/A  . Number of Children: N/A  . Years of Education: N/A   Occupational History  . Not on file.   Social History Main Topics  . Smoking status: Never Smoker   . Smokeless tobacco: Never Used  . Alcohol Use: No  . Drug Use: No  . Sexual Activity: No     Comment: HYST   Other Topics Concern  . 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 Oophorectomy      BSO  . Total abdominal hysterectomy  1988    ovarian cyst  BSO  . Cholecystectomy  2009  . Tonsillectomy  as child  . Laparotomy N/A 11/05/2013    Procedure: RESECTION OF PELVIC MASS;  Surgeon: Imagene Gurney A. Alycia Rossetti, MD;  Location: [facility];  Service: Gynecology;  Laterality: N/A;  . Application of a-cell of chest/abdomen N/A 11/05/2013    Procedure: ABDOMINAL WALL RESCONTRUCTION WITH STRATUS;  Surgeon: Irene Limbo, MD;  Location: [facility];  Service: Plastics;  Laterality: N/A;  . Appendectomy      Past Medical Hx:  Past Medical History  Diagnosis Date  . Ovarian cyst   . Thyroid disease   . Hypercholesteremia     under control with diet and fish oil  . GERD (gastroesophageal reflux disease)  hx of years ago  . Restless leg syndrome   . PONV (postoperative nausea and vomiting)   . Cancer ([place]) 1988, 2015    ovarian, adenocarcinoma  . Anxiety     new dx  . Hx of radiation therapy 01/16/14-02/27/14    abdomen   . Hypertension     Oncology Hx:    Adenocarcinoma of abdominal wall, unclear primary   08/09/2013 Initial Diagnosis Adenocarcinoma of abdominal wall, unclear primary   08/16/2013 - 09/25/2013 Chemotherapy paclitaxel and carboplatin   11/05/2013 Surgery resection abdominal wall mass    - 02/27/2014 Radiation Therapy Completed  volume directed radiation    Family Hx:  Family History  Problem Relation Age of Onset  . Heart disease Mother   . Heart disease Sister   . Diabetes Sister   . Cancer Sister     melanoma-skin cancer  . Breast cancer Paternal Grandmother     Age 61's  . Cancer Paternal Grandmother     breast  . Cancer Paternal Grandfather     prostate/bladder    Vitals:  Blood pressure 105/56, pulse 58, temperature 98.3 F (36.8 C), temperature source Oral, resp. rate 18, height 5' 7"  (1.702 m), weight 135 lb 3.2 oz (61.326 kg), SpO2 98 %.  Physical Exam: Well-nourished well-developed female who appears younger than stated age in no acute distress.   Neck: Supple, no lymphadenopathy, no thyromegaly.  Lungs: Clear to auscultation bilateral.  Cardiovascular: Regular rate and rhythm.  Abdomen: Well-healed transverse Pfannenstiel skin incision. There is no fluid wave is no other palpable masses. There is no hepatomegaly.   Groins: No lymphadenopathy.  Extremities: No edema.  Pelvic: Normal female genitalia. Unidigital pelvic examination reveals no masses or nodularity. Rectal confirms.  Assessment/Plan: 70 year old with a history of adenocarcinoma within the left ovary found incidentally at the time of a total abdominal hysterectomy bilateral salpingo-oophorectomy. This was in 1988. She did not require any additional treatment. I do not have a final pathology report. This was an incidental note made in her discharge summary. We treated this as an ovarian cancer recurrence with a neoadjuvant approach.  She did not have a significant improvement in the size of the mass nor in her tumor markers with adjuvant chemotherapy. Therefore, she underwent surgical resection. She there was a positive margin and she underwent volume directed radiation.   She's been doing well since then. We will follow-up in results of her CA-125 from today. She will follow-up with Dr. Benay Spice in November as scheduled and  return to see me approximately 3-4 months after that.  She was provided a refill of hydrocodone at her request for her restless leg syndrome.    Tyrice Hewitt A., MD 11/11/2015, 1:55 PM

## 2015-11-12 ENCOUNTER — Telehealth: Payer: Self-pay | Admitting: *Deleted

## 2015-11-12 DIAGNOSIS — Z6820 Body mass index (BMI) 20.0-20.9, adult: Secondary | ICD-10-CM | POA: Diagnosis not present

## 2015-11-12 DIAGNOSIS — C799 Secondary malignant neoplasm of unspecified site: Secondary | ICD-10-CM | POA: Diagnosis not present

## 2015-11-12 DIAGNOSIS — Z1389 Encounter for screening for other disorder: Secondary | ICD-10-CM | POA: Diagnosis not present

## 2015-11-12 DIAGNOSIS — Z0000 Encounter for general adult medical examination without abnormal findings: Secondary | ICD-10-CM | POA: Diagnosis not present

## 2015-11-12 DIAGNOSIS — E063 Autoimmune thyroiditis: Secondary | ICD-10-CM | POA: Diagnosis not present

## 2015-11-12 LAB — CANCER ANTIGEN 125 (PARALLEL TESTING): CA 125: 9 U/mL (ref <35)

## 2015-11-12 LAB — CA 125: CANCER ANTIGEN (CA) 125: 16.9 U/mL (ref 0.0–38.1)

## 2015-11-12 NOTE — Telephone Encounter (Signed)
-----   Message from Ladell Pier, MD sent at 11/12/2015  7:26 AM EDT ----- Please call patient, ca125 is normal

## 2015-11-12 NOTE — Telephone Encounter (Signed)
Patient returned call as requested.  Informed her the CA 125 lab is normal.  No further questions at this time.

## 2015-11-12 NOTE — Telephone Encounter (Signed)
Per Dr. Benay Spice, pt notified that ca125 results are normal.  Pt appreciative of call and has no questions or concerns at this time.

## 2015-11-13 ENCOUNTER — Telehealth: Payer: Self-pay

## 2015-11-13 NOTE — Telephone Encounter (Signed)
Orders received from [facility] to contact the patient and update with CA 125 level ( 16.9 ) " WNL" . Patient contacted and updated , patient states understanding , denies further questions at this time

## 2015-12-08 ENCOUNTER — Encounter: Payer: Self-pay | Admitting: Cardiology

## 2015-12-08 ENCOUNTER — Other Ambulatory Visit: Payer: Self-pay | Admitting: Cardiology

## 2015-12-08 ENCOUNTER — Ambulatory Visit (INDEPENDENT_AMBULATORY_CARE_PROVIDER_SITE_OTHER): Payer: Medicare Other | Admitting: Cardiology

## 2015-12-08 ENCOUNTER — Encounter: Payer: Self-pay | Admitting: *Deleted

## 2015-12-08 VITALS — BP 102/65 | HR 59 | Ht 67.0 in | Wt 133.4 lb

## 2015-12-08 DIAGNOSIS — Z7901 Long term (current) use of anticoagulants: Secondary | ICD-10-CM

## 2015-12-08 DIAGNOSIS — I480 Paroxysmal atrial fibrillation: Secondary | ICD-10-CM

## 2015-12-08 NOTE — Patient Instructions (Signed)

## 2015-12-08 NOTE — Progress Notes (Signed)
Clinical Summary [name] is a 70 y.o.female seen today for follow up of the following medical problems.   1. Afib - new diagnosis during 12/2014 admission, presented with palpitations and left arm pain - converted back to NSR while admitted, discharged on low dose lopressor, higher doses caused some low heart rates and soft bp's.  - echo LVEF 55-60%, mod LAE.   - no recent palpitations. No bleeding troubles on eliquis.    2. Metatstic Adenocardinoma Ovarian - followed by oncology     Past Medical History:  Diagnosis Date  . Anxiety    new dx  . Cancer ([facility]) 1988, 2015   ovarian, adenocarcinoma  . GERD (gastroesophageal reflux disease)    hx of years ago  . Hx of radiation therapy 01/16/14-02/27/14   abdomen   . Hypercholesteremia    under control with diet and fish oil  . Hypertension   . Ovarian cyst   . PONV (postoperative nausea and vomiting)   . Restless leg syndrome   . Thyroid disease      No Known Allergies   Current Outpatient Prescriptions  Medication Sig Dispense Refill  . ALPRAZolam (XANAX) 0.25 MG tablet Take 0.25 mg by mouth at bedtime as needed for sleep.     Marland Kitchen apixaban (ELIQUIS) 5 MG TABS tablet Take 1 tablet (5 mg total) by mouth 2 (two) times daily. 14 tablet 0  . Calcium Carb-Cholecalciferol (CALCIUM 600 + D PO) Take 1 tablet by mouth daily.    . Cholecalciferol (EQL VITAMIN D3) 1000 UNITS tablet Take 1,000 Units by mouth daily.    Marland Kitchen HYDROcodone-acetaminophen (NORCO/VICODIN) 5-325 MG tablet Take 1 tablet by mouth every 6 (six) hours as needed for moderate pain (restless legs). 30 tablet 0  . levothyroxine (SYNTHROID, LEVOTHROID) 75 MCG tablet Take 75 mcg by mouth every morning.     . metoprolol tartrate (LOPRESSOR) 25 MG tablet TAKE (1/2) TABLET BY MOUTH TWICE DAILY. 30 tablet 6  . Multiple Vitamins-Minerals (MULTIVITAMIN WITH MINERALS) tablet Take 1 tablet by mouth daily.     . Omega-3 Fatty Acids (FISH OIL) 1200 MG CAPS Take 1,200 mg  by mouth daily.     Marland Kitchen PREMARIN 0.625 MG tablet TAKE AS DIRECTED. (Patient taking differently: TAKE 1 TABLET TWICE WEEKLY ([place]).) 30 tablet 6   No current facility-administered medications for this visit.      Past Surgical History:  Procedure Laterality Date  . APPENDECTOMY    . APPLICATION OF A-CELL OF CHEST/ABDOMEN N/A 11/05/2013   Procedure: ABDOMINAL WALL RESCONTRUCTION WITH STRATUS;  Surgeon: Irene Limbo, MD;  Location: [facility];  Service: Plastics;  Laterality: N/A;  . CHOLECYSTECTOMY  2009  . LAPAROTOMY N/A 11/05/2013   Procedure: RESECTION OF PELVIC MASS;  Surgeon: Imagene Gurney A. Alycia Rossetti, MD;  Location: [facility];  Service: Gynecology;  Laterality: N/A;  . OOPHORECTOMY     BSO  . TONSILLECTOMY  as child  . TOTAL ABDOMINAL HYSTERECTOMY  1988   ovarian cyst  BSO     No Known Allergies    Family History  Problem Relation Age of Onset  . Heart disease Mother   . Heart disease Sister   . Diabetes Sister   . Cancer Sister     melanoma-skin cancer  . Breast cancer Paternal Grandmother     Age 37's  . Cancer Paternal Grandmother     breast  . Cancer Paternal Grandfather     prostate/bladder     Social History  [name] reports that she has never smoked. She has never used smokeless tobacco. [name] reports that she does not drink alcohol.   Review of Systems CONSTITUTIONAL: No weight loss, fever, chills, weakness or fatigue.  HEENT: Eyes: No visual loss, blurred vision, double vision or yellow sclerae.No hearing loss, sneezing, congestion, runny nose or sore throat.  SKIN: No rash or itching.  CARDIOVASCULAR: per HPI RESPIRATORY: No shortness of breath, cough or sputum.  GASTROINTESTINAL: No anorexia, nausea, vomiting or diarrhea. No abdominal pain or blood.  GENITOURINARY: No burning on urination, no polyuria NEUROLOGICAL: No headache, dizziness, syncope, paralysis, ataxia, numbness or tingling in the extremities. No change in bowel or bladder  control.  MUSCULOSKELETAL: No muscle, back pain, joint pain or stiffness.  LYMPHATICS: No enlarged nodes. No history of splenectomy.  PSYCHIATRIC: No history of depression or anxiety.  ENDOCRINOLOGIC: No reports of sweating, cold or heat intolerance. No polyuria or polydipsia.  Marland Kitchen   Physical Examination Vitals:   12/08/15 1311  BP: 102/65  Pulse: (!) 59   Vitals:   12/08/15 1311  Weight: 133 lb 6.4 oz (60.5 kg)  Height: '5\' 7"'$  (1.702 m)    Gen: resting comfortably, no acute distress HEENT: no scleral icterus, pupils equal round and reactive, no palptable cervical adenopathy,  CV: RRR, no m/r/g, no jvd Resp: Clear to auscultation bilaterally GI: abdomen is soft, non-tender, non-distended, normal bowel sounds, no hepatosplenomegaly MSK: extremities are warm, no edema.  Skin: warm, no rash Neuro:  no focal deficits Psych: appropriate affect   Diagnostic Studies 12/2014 echo Study Conclusions  - Left ventricle: The cavity size was normal. Wall thickness was normal. Systolic function was normal. The estimated ejection fraction was in the range of 55% to 60%. Wall motion was normal; there were no regional wall motion abnormalities. Left ventricular diastolic function parameters were normal. - Mitral valve: Mildly calcified annulus. There was mild regurgitation. - Left atrium: The atrium was moderately dilated. Volume/bsa, S: 37.9 ml/m^2. - Right atrium: The atrium was mildly to moderately dilated. - Tricuspid valve: There was mild-moderate regurgitation.    Assessment and Plan   1. PAF - no current symptoms - continue lopressor. She will conitnue eliquis, CHADS2Vasc score is 2.   F/u 1 year. Request pcp labs.      Arnoldo Lenis, M.D.

## 2015-12-22 DIAGNOSIS — D72819 Decreased white blood cell count, unspecified: Secondary | ICD-10-CM | POA: Diagnosis not present

## 2016-02-05 DIAGNOSIS — E049 Nontoxic goiter, unspecified: Secondary | ICD-10-CM | POA: Diagnosis not present

## 2016-02-05 DIAGNOSIS — E032 Hypothyroidism due to medicaments and other exogenous substances: Secondary | ICD-10-CM | POA: Diagnosis not present

## 2016-03-01 DIAGNOSIS — L728 Other follicular cysts of the skin and subcutaneous tissue: Secondary | ICD-10-CM | POA: Diagnosis not present

## 2016-03-01 DIAGNOSIS — L821 Other seborrheic keratosis: Secondary | ICD-10-CM | POA: Diagnosis not present

## 2016-03-01 DIAGNOSIS — L814 Other melanin hyperpigmentation: Secondary | ICD-10-CM | POA: Diagnosis not present

## 2016-03-01 DIAGNOSIS — D1801 Hemangioma of skin and subcutaneous tissue: Secondary | ICD-10-CM | POA: Diagnosis not present

## 2016-03-16 DIAGNOSIS — Z23 Encounter for immunization: Secondary | ICD-10-CM | POA: Diagnosis not present

## 2016-03-17 ENCOUNTER — Ambulatory Visit (HOSPITAL_BASED_OUTPATIENT_CLINIC_OR_DEPARTMENT_OTHER): Payer: Medicare Other | Admitting: Oncology

## 2016-03-17 ENCOUNTER — Telehealth: Payer: Self-pay | Admitting: Oncology

## 2016-03-17 ENCOUNTER — Other Ambulatory Visit (HOSPITAL_BASED_OUTPATIENT_CLINIC_OR_DEPARTMENT_OTHER): Payer: Medicare Other

## 2016-03-17 DIAGNOSIS — C792 Secondary malignant neoplasm of skin: Secondary | ICD-10-CM

## 2016-03-17 DIAGNOSIS — C569 Malignant neoplasm of unspecified ovary: Secondary | ICD-10-CM | POA: Diagnosis not present

## 2016-03-17 DIAGNOSIS — G2581 Restless legs syndrome: Secondary | ICD-10-CM

## 2016-03-17 MED ORDER — HYDROCODONE-ACETAMINOPHEN 5-325 MG PO TABS: 1.0000 | ORAL_TABLET | Freq: Four times a day (QID) | ORAL | 0 refills | Status: DC | PRN

## 2016-03-17 NOTE — Progress Notes (Signed)
  [name] OFFICE PROGRESS NOTE   Diagnosis: Ovarian cancer  INTERVAL HISTORY:   [name] returns as scheduled. She feels well. She is working. No mass at the low transverse incision. She has intermittent leg discomfort related to "restless legs ". She takes hydrocodone infrequently.  Objective: Not we will follow-up is as well. She is a What was found in 28 has not used for hematochezia  Vital signs in last 24 hours:  Blood pressure (!) 116/52, pulse 64, temperature 98.1 F (36.7 C), temperature source Oral, resp. rate 17, height '5\' 7"'$  (1.702 m), weight 132 lb 4.8 oz (60 kg), SpO2 100 %.    HEENT: There is fullness in the right supraclavicular fossa and right scalene area compared to the left side. No discrete mass. Lymphatics: No cervical, supra-clavicular, axillary, or inguinal nodes Resp: Lungs clear bilaterally Cardio: Regular rate and rhythm GI: No hepatosplenomegaly, no mass, no apparent ascites, no recurrent tumor at the low transverse scar Vascular: No leg edema  Lab Results: CA 125 pending  Medications: I have reviewed the patient's current medications.  Assessment/Plan: 1. 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  Staging CTs of the chest, abdomen, and pelvis with no other site of metastatic disease or a primary tumor.   Elevated CA 125.   Cycle 1 Taxol/carboplatin 08/16/2013.   Cycle 2 Taxol/carboplatin 09/06/2013.   Cycle 3 Taxol/carboplatin 09/27/2013   CT 10/11/2013 with a stable abdominal wall mass.  Status post resection of abdominal wall mass with abdominal wall reconstruction 11/05/2013. Final pathology showed adenocarcinoma. Specimen extensively involved with adenocarcinoma which focally involved the edge of the specimen. Immunostains and Foundation 1 testing were nonspecific for a primary tumor location.  Adjuvant radiation to the abdominal wall 01/16/2014 through 02/27/2014 2. Ovarian cancer in  1988, low-grade adenocarcinoma with areas of "borderline" carcinoma, status post a hysterectomy and bilateral oophorectomy. 3. New onset atrial fibrillation August 2016, maintained on eliquis      Disposition:  [name] remains in clinical remission from the metastatic adenocarcinoma. I suspect the asymmetry at the right supraclavicular fossa is a benign finding. She will return for repeat examination of this area in 3 months.  We will follow-up on the CA 125 from today.  Danielle Coder, MD  03/17/2016  3:25 PM

## 2016-03-17 NOTE — Telephone Encounter (Signed)
Appointments scheduled per 11/2 LOS. The patient was given AVS report and calendars with future scheduled appointments.

## 2016-03-18 ENCOUNTER — Telehealth: Payer: Self-pay | Admitting: *Deleted

## 2016-03-18 LAB — CANCER ANTIGEN 125 (PARALLEL TESTING): CA 125: 10 U/mL (ref <35)

## 2016-03-18 LAB — CA 125: Cancer Antigen (CA) 125: 17.7 U/mL (ref 0.0–38.1)

## 2016-03-18 NOTE — Telephone Encounter (Signed)
-----   Message from Ladell Pier, MD sent at 03/18/2016  5:28 PM EDT ----- Please call patient, ca125 is normal

## 2016-03-18 NOTE — Telephone Encounter (Signed)
Left message on voicemail for pt to call office for lab result.

## 2016-03-21 ENCOUNTER — Telehealth: Payer: Self-pay | Admitting: *Deleted

## 2016-03-21 NOTE — Telephone Encounter (Signed)
Call received from patient asking for Ca 125 lab results drawn 03-17-2016 and if this is a good result.  CA-125 = 10.   No further questions.

## 2016-03-25 ENCOUNTER — Other Ambulatory Visit: Payer: Self-pay | Admitting: Family Medicine

## 2016-03-25 DIAGNOSIS — Z1231 Encounter for screening mammogram for malignant neoplasm of breast: Secondary | ICD-10-CM

## 2016-04-26 ENCOUNTER — Ambulatory Visit
Admission: RE | Admit: 2016-04-26 | Discharge: 2016-04-26 | Disposition: A | Discharge status: Home / Self Care | Payer: Medicare Other | Source: Ambulatory Visit | Attending: Family Medicine | Admitting: Family Medicine

## 2016-04-26 DIAGNOSIS — Z1231 Encounter for screening mammogram for malignant neoplasm of breast: Secondary | ICD-10-CM

## 2016-05-06 DIAGNOSIS — E039 Hypothyroidism, unspecified: Secondary | ICD-10-CM | POA: Diagnosis not present

## 2016-05-06 DIAGNOSIS — E032 Hypothyroidism due to medicaments and other exogenous substances: Secondary | ICD-10-CM | POA: Diagnosis not present

## 2016-05-12 ENCOUNTER — Other Ambulatory Visit: Payer: Self-pay | Admitting: Cardiology

## 2016-05-24 ENCOUNTER — Other Ambulatory Visit: Payer: Self-pay | Admitting: Nurse Practitioner

## 2016-06-08 ENCOUNTER — Other Ambulatory Visit: Payer: Self-pay | Admitting: *Deleted

## 2016-06-08 MED ORDER — APIXABAN 5 MG PO TABS: ORAL_TABLET | ORAL | 3 refills | Status: DC

## 2016-06-16 ENCOUNTER — Telehealth: Payer: Self-pay | Admitting: Oncology

## 2016-06-16 ENCOUNTER — Ambulatory Visit (HOSPITAL_BASED_OUTPATIENT_CLINIC_OR_DEPARTMENT_OTHER): Payer: Medicare Other | Admitting: Oncology

## 2016-06-16 VITALS — BP 122/64 | HR 64 | Temp 98.0°F | Resp 18 | Ht 67.0 in | Wt 134.7 lb

## 2016-06-16 DIAGNOSIS — I4891 Unspecified atrial fibrillation: Secondary | ICD-10-CM | POA: Diagnosis not present

## 2016-06-16 DIAGNOSIS — C792 Secondary malignant neoplasm of skin: Secondary | ICD-10-CM

## 2016-06-16 DIAGNOSIS — C569 Malignant neoplasm of unspecified ovary: Secondary | ICD-10-CM

## 2016-06-16 NOTE — Progress Notes (Signed)
  [place] OFFICE PROGRESS NOTE   Diagnosis: Ovarian cancer  INTERVAL HISTORY:   Ms. Ende returns as scheduled. She feels well. She is working. No new complaint.  Objective:  Vital signs in last 24 hours:  Blood pressure 122/64, pulse 64, temperature 98 F (36.7 C), temperature source Oral, resp. rate 18, height '5\' 7"'$  (1.702 m), weight 134 lb 11.2 oz (61.1 kg), SpO2 100 %.    HEENT: Very slight fullness in the right compared to the left supraclavicular fossa and scalene area. No discrete mass. Lymphatics: No cervical, supraclavicular, axillary, or inguinal nodes Resp: Lungs clear bilaterally Cardio: Regular rate and rhythm GI: No hepatosplenomegaly, no mass, no apparent ascites Vascular: No leg edema   Lab Results: CEA 125 on 03/17/2016-17.7  Medications: I have reviewed the patient's current medications.  Assessment/Plan: 1. 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  Staging CTs of the chest, abdomen, and pelvis with no other site of metastatic disease or a primary tumor.   Elevated CA 125.   Cycle 1 Taxol/carboplatin 08/16/2013.   Cycle 2 Taxol/carboplatin 09/06/2013.   Cycle 3 Taxol/carboplatin 09/27/2013   CT 10/11/2013 with a stable abdominal wall mass.  Status post resection of abdominal wall mass with abdominal wall reconstruction 11/05/2013. Final pathology showed adenocarcinoma. Specimen extensively involved with adenocarcinoma which focally involved the edge of the specimen. Immunostains and Foundation 1 testing were nonspecific for a primary tumor location.  Adjuvant radiation to the abdominal wall 01/16/2014 through 02/27/2014 2. Ovarian cancer in 1988, low-grade adenocarcinoma with areas of "borderline" carcinoma, status post a hysterectomy and bilateral oophorectomy. 3. New onset atrial fibrillation August 2016, maintained on eliquis    Disposition:  Ms. Scoggin remains in clinical remission from  ovarian cancer. I suspect the slight fullness in the right lower neck is a benign finding. She will contact us if this area changes. We will refer her for an appointment with Dr. Alycia Rossetti in 4 months. She will return for an office visit in 8 months. 15 minutes were spent with the patient today. The majority of the time was used for counseling and coordination of care.  [name], MD  06/16/2016  4:33 PM

## 2016-06-16 NOTE — Telephone Encounter (Signed)
Gave patient avs report and appointments for May lab/Dr. Alycia Rossetti and October with Dr. Benay Spice. May appointment with Dr. Alycia Rossetti scheduled for 2/1 los sent by Dr. Benay Spice requesting f/u with Dr. Alycia Rossetti in 4 months.

## 2016-07-21 ENCOUNTER — Other Ambulatory Visit: Payer: Self-pay | Admitting: Gynecologic Oncology

## 2016-07-22 ENCOUNTER — Other Ambulatory Visit: Payer: Self-pay | Admitting: *Deleted

## 2016-07-22 MED ORDER — ESTROGENS CONJUGATED 0.625 MG PO TABS: 0.6250 mg | ORAL_TABLET | ORAL | 6 refills | Status: DC

## 2016-07-29 ENCOUNTER — Other Ambulatory Visit: Payer: Self-pay | Admitting: Cardiology

## 2016-08-01 DIAGNOSIS — I4891 Unspecified atrial fibrillation: Secondary | ICD-10-CM | POA: Diagnosis not present

## 2016-08-01 DIAGNOSIS — Z6821 Body mass index (BMI) 21.0-21.9, adult: Secondary | ICD-10-CM | POA: Diagnosis not present

## 2016-08-01 DIAGNOSIS — Z1389 Encounter for screening for other disorder: Secondary | ICD-10-CM | POA: Diagnosis not present

## 2016-08-01 DIAGNOSIS — F419 Anxiety disorder, unspecified: Secondary | ICD-10-CM | POA: Diagnosis not present

## 2016-08-01 DIAGNOSIS — G2581 Restless legs syndrome: Secondary | ICD-10-CM | POA: Diagnosis not present

## 2016-08-02 DIAGNOSIS — R319 Hematuria, unspecified: Secondary | ICD-10-CM | POA: Diagnosis not present

## 2016-08-02 DIAGNOSIS — N281 Cyst of kidney, acquired: Secondary | ICD-10-CM | POA: Diagnosis not present

## 2016-08-05 DIAGNOSIS — E049 Nontoxic goiter, unspecified: Secondary | ICD-10-CM | POA: Diagnosis not present

## 2016-08-15 DIAGNOSIS — H25099 Other age-related incipient cataract, unspecified eye: Secondary | ICD-10-CM | POA: Diagnosis not present

## 2016-08-26 ENCOUNTER — Other Ambulatory Visit: Payer: Self-pay | Admitting: Cardiology

## 2016-08-30 ENCOUNTER — Other Ambulatory Visit: Payer: Self-pay

## 2016-08-30 MED ORDER — METOPROLOL TARTRATE 25 MG PO TABS: ORAL_TABLET | ORAL | 3 refills | Status: DC

## 2016-09-28 ENCOUNTER — Ambulatory Visit: Payer: Medicare Other | Attending: Gynecologic Oncology | Admitting: Gynecologic Oncology

## 2016-09-28 ENCOUNTER — Other Ambulatory Visit: Payer: Self-pay | Admitting: Gynecologic Oncology

## 2016-09-28 ENCOUNTER — Other Ambulatory Visit (HOSPITAL_BASED_OUTPATIENT_CLINIC_OR_DEPARTMENT_OTHER): Payer: Medicare Other

## 2016-09-28 ENCOUNTER — Encounter: Payer: Self-pay | Admitting: Gynecologic Oncology

## 2016-09-28 VITALS — BP 125/62 | HR 56 | Temp 97.7°F | Resp 20 | Wt 138.0 lb

## 2016-09-28 DIAGNOSIS — K219 Gastro-esophageal reflux disease without esophagitis: Secondary | ICD-10-CM | POA: Diagnosis not present

## 2016-09-28 DIAGNOSIS — Z833 Family history of diabetes mellitus: Secondary | ICD-10-CM | POA: Diagnosis not present

## 2016-09-28 DIAGNOSIS — Z803 Family history of malignant neoplasm of breast: Secondary | ICD-10-CM | POA: Diagnosis not present

## 2016-09-28 DIAGNOSIS — C494 Malignant neoplasm of connective and soft tissue of abdomen: Secondary | ICD-10-CM

## 2016-09-28 DIAGNOSIS — Z79899 Other long term (current) drug therapy: Secondary | ICD-10-CM | POA: Insufficient documentation

## 2016-09-28 DIAGNOSIS — N83209 Unspecified ovarian cyst, unspecified side: Secondary | ICD-10-CM | POA: Diagnosis not present

## 2016-09-28 DIAGNOSIS — Z90710 Acquired absence of both cervix and uterus: Secondary | ICD-10-CM | POA: Insufficient documentation

## 2016-09-28 DIAGNOSIS — F419 Anxiety disorder, unspecified: Secondary | ICD-10-CM | POA: Diagnosis not present

## 2016-09-28 DIAGNOSIS — Z9221 Personal history of antineoplastic chemotherapy: Secondary | ICD-10-CM | POA: Insufficient documentation

## 2016-09-28 DIAGNOSIS — C569 Malignant neoplasm of unspecified ovary: Secondary | ICD-10-CM

## 2016-09-28 DIAGNOSIS — C792 Secondary malignant neoplasm of skin: Secondary | ICD-10-CM

## 2016-09-28 DIAGNOSIS — Z9889 Other specified postprocedural states: Secondary | ICD-10-CM | POA: Insufficient documentation

## 2016-09-28 DIAGNOSIS — Z9049 Acquired absence of other specified parts of digestive tract: Secondary | ICD-10-CM | POA: Diagnosis not present

## 2016-09-28 DIAGNOSIS — E7800 Pure hypercholesterolemia, unspecified: Secondary | ICD-10-CM | POA: Insufficient documentation

## 2016-09-28 DIAGNOSIS — Z8042 Family history of malignant neoplasm of prostate: Secondary | ICD-10-CM | POA: Diagnosis not present

## 2016-09-28 DIAGNOSIS — Z8249 Family history of ischemic heart disease and other diseases of the circulatory system: Secondary | ICD-10-CM | POA: Insufficient documentation

## 2016-09-28 DIAGNOSIS — E079 Disorder of thyroid, unspecified: Secondary | ICD-10-CM | POA: Diagnosis not present

## 2016-09-28 DIAGNOSIS — I10 Essential (primary) hypertension: Secondary | ICD-10-CM | POA: Insufficient documentation

## 2016-09-28 DIAGNOSIS — C562 Malignant neoplasm of left ovary: Secondary | ICD-10-CM | POA: Diagnosis not present

## 2016-09-28 DIAGNOSIS — Z923 Personal history of irradiation: Secondary | ICD-10-CM | POA: Diagnosis not present

## 2016-09-28 DIAGNOSIS — G2581 Restless legs syndrome: Secondary | ICD-10-CM | POA: Diagnosis not present

## 2016-09-28 DIAGNOSIS — R1900 Intra-abdominal and pelvic swelling, mass and lump, unspecified site: Secondary | ICD-10-CM

## 2016-09-28 NOTE — Progress Notes (Signed)
Consult Note: Gyn-Onc  [name] 71 y.o. female  CC:  Chief Complaint  Patient presents with  . Malignant neoplasm of connective and soft tissue of abdomina    HPI:  Patient 71 year old gravida 0 who in 1988 underwent a total dominant hysterectomy bilateral salpingo-oophorectomy by Dr. Ree Edman. 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 Her washings were negative. CA 125 at the time of diagnosis was 33. She's been doing well without significant complaints since that time. She's had excellent routine medical care. For the past several months she felt that her stomach "old age" in the shower and felt that she was gaining weight. She scratched her abdomen felt that she felt a mass and had some discomfort and she went to see her primary care physician Dr. Gerarda Fraction. A CT scan of the chest abdomen and pelvis was ordered on 08/06/2013 but revealed:  FINDINGS:  Negative lung bases. No pericardial or pleural effusion. No acute osseous abnormality identified. Advanced L4-L5 disc degeneration. Lobulated soft tissue mass arising just to the right of midline in the lower abdomen and suprapubic region, inseparable from both caudal rectus muscles, more so the right. Approaching the mass the right rectus muscle is expanded (series 2, image 57. The mass is multilobulated and encompasses 55 x 79 x 97 cm.There is a narrow fat plane between the mass and underlying small bowel and bladder within the pelvis. No inguinal or pelvic sidewall lymphadenopathy. No pelvic free fluid. Uterus and adnexa not identified and appear to be surgically absent. Negative distal colon except for retained stool and diverticulosis. Left colon, transverse colon, right colon within normal limits. Appendix not identified. Oral contrast has almost reached the terminal ileum. No dilated small bowel. Fairly decompressed stomach. Negative duodenum.  Surgically absent gallbladder. Mild intra and extrahepatic biliary ductal enlargement likely postoperative. Normal liver enhancement. Spleen, pancreas, and adrenal glands are normal. Portal venous system within normal limits. Major arterial structures in the abdomen and pelvis are patent. Aortoiliac calcified atherosclerosis noted. No abdominal free fluid. No lymphadenopathy. Kidneys within normal limits; small left upper pole low-density area with simple fluid densitometry.  IMPRESSION:  1. Lower abdominal/suprapubic ventral wall lobulated soft tissue mass, arising within the lower rectus musculature more so on the right. 5.5 x 7.9 x 9.7 cm. Favor sarcoma or other connective tissue tumor. This should be amenable to percutaneous biopsy. In a younger female patient, endometriosis within a Cesarean section scar would also be a consideration for this appearance.  2. Narrow fat plane between the mass in #1 and underlying small bowel and bladder. No lymphadenopathy. No ascites.  3. Surgically absent gallbladder, uterus, and adnexa. Diverticulosis of the colon.  She underwent a core biopsy of the lesion on March 17 that revealed: Diagnosis Soft Tissue Needle Core Biopsy, abdominal wall mass - METASTATIC ADENOCARCINOMA INVOLVING SOFT TISSUE, SEE COMMENT. Microscopic Comment Needle core biopsies demonstrate diffuse involvement by well differentiated adenocarcinoma. The adenocarcinoma has the following immunophenotype: Cytokeratin 7 - strong diffuse expression Cytokeratin 20 - negative expression CDX2 - negative expression TTF-1 - negative expression Estrogen receptor - negative expression Vimentin - patchy mild to moderate expression The clinical history of hysterectomy for "possible endometrial cancer" is noted. The morphology and immunophenotype of the current biopsies is consistent with metastatic adenocarcinoma. Given the clinical history, metastasis from a gynecological primary is a consideration.  However, metastasis from a primary lung, upper gastrointestinal tract or pancreatic-biliary  tumor is not definitively excluded.  With a biopsy was not definitive, given her past history as well no other primary site was felt that this very well could be a recurrence of her primary ovarian tumor. Use in the area of her prior transverse skin incision. There is a discussion with Dr. Barry Dienes, Dr. Benay Spice, plastic surgery, and myself it was felt that it would be a fairly radical dissection to proceed with surgery at this time and the decision was made to approach her with neoadjuvant chemotherapy. Her CA 125 was elevated at the time of cycle #1 of chemotherapy at 128. She's another cycle of chemotherapy scheduled for April 24. She's overall tolerating her chemotherapy quite well. She had diarrhea for one day presented by his been feeling well. She is up-to-date on her mammograms. Her colonoscopy 2-3 years ago and was recommended she'll follow up in 5 years. Family history significant for paternal grandmother with breast cancer in her 1s. She has 2 sisters who have had melanoma felt to be due to sun exposure. The patient herself does see a dermatologist.  She's completed her third cycle of paclitaxel and carboplatin on Sep 28 2011. Her CA 125 at that time was 147 which is not significantly changed from her pre-chemotherapy value of 128. She had a CT scan that revealed: FINDINGS:  Lung bases show no acute findings. Heart size normal. No pericardial or pleural effusion. Liver is unremarkable. Cholecystectomy with stable intrahepatic and extrahepatic biliary duct prominence. Adrenal glands and right kidney are unremarkable. Low-attenuation lesions in the left kidney measure up to 1.4 cm, as before and are likely cysts. Kidneys, spleen, pancreas, stomach and bowel are unremarkable. A heterogeneous mass centered in the inferior right rectus abdominus muscle measures 5.3 x 7.5 cm (previously 5.5 x 7.9 cm). No  pathologically enlarged lymph nodes. Scattered atherosclerotic calcification of the arterial vasculature without abdominal aortic aneurysm. No free fluid. No worrisome lytic or sclerotic lesions.  Degenerative changes are seen in the spine.  IMPRESSION:  Right rectus abdominus mass is stable to very minimally smaller than on 07/19/2013.  Surgery 11/05/13: Findings Operative findings: 8 involving the subcutaneous tissues, fascia, and rectus on the right side. No obvious intraperitoneal disease. Normal-appearing omentum    Biopsies of (if applicable) revealed: Diagnosis 11/05/2013:  Soft tissue mass, simple excision, abdominal wall mass  ADENOCARCINOMA.  Microscopic Comment  The specimen is extensively involved by adenocarcinoma which focally involves the edge of the specimen. Immunohistochemistry is performed and the tumor shows patchy positivity with Napsin-A and is negative with WT-1, estrogen receptor, progesterone receptor, gross disease cystic fluid protein, CDX-2, carcinoembryonic antigen, thyroid transcription factor-1, CD10 and CD117. The immunophenotype is nonspecific and additional immunohistochemistry will be performed and reported as an addendum. (JDP:kh 11/07/13) ADDENDUM: Additional immunohistochemistry is performed and the tumor is positive with cytokeratin AE1/AE3 and cytokeratin 7 and is negative with Calretinin, cytokeratin 5/6 and cytokeratin 20. The immunoreactivity is not specific for location of a primary tumor.   She underwent some testing with Foundation 1. 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 She underwent tumor directed radiation therapy which she completed October 15.   Interval history:  She saw Dr. Benay Spice in November of last year and February of this year. In November last year her CA-125 was normal at 10. She comes in today for follow-up. I last saw her in June of last year. At that time her exam was unremarkable. Overall doing very well. She had a mammogram in December  that was negative.  She seeing her primary care physician in July for her regular blood work. She really offers no complaints. She states her stamina is not what it used to be but otherwise is not limited and is able to do most everything that she wants to do. Her review of systems is completely negative. There are no new medical issues and her family that are concerning to her 89 and she states her physicians have not told her of any new issues that they are concerned about either.  Review of Systems  Constitutional: Denies fever. Skin: No rash, sores, jaundice, itching, or dryness.  Cardiovascular: No chest pain, shortness of breath, or edema  Pulmonary: No cough Gastro Intestinal: No nausea, vomiting, constipation, or diarrhea reported.  Genitourinary: Denies vaginal bleeding and discharge.  Musculoskeletal: No myalgia, arthralgia Neurologic: No weakness. + restless legs Psychology: No complaints   Current Meds:  Outpatient Encounter Prescriptions as of 09/28/2016  Medication Sig  . ALPRAZolam (XANAX) 0.25 MG tablet Take 0.25 mg by mouth at bedtime as needed for sleep.   Marland Kitchen apixaban (ELIQUIS) 5 MG TABS tablet TAKE (1) TABLET BY MOUTH TWICE DAILY.  . Calcium Carb-Cholecalciferol (CALCIUM 600 + D PO) Take 1 tablet by mouth daily.  . Cholecalciferol (EQL VITAMIN D3) 1000 UNITS tablet Take 1,000 Units by mouth daily.  Marland Kitchen estrogens, conjugated, (PREMARIN) 0.625 MG tablet Take 1 tablet (0.625 mg total) by mouth See admin instructions. Take daily for 21 days then do not take for 7 days. (Patient taking differently: Take 0.625 mg by mouth See admin instructions. Pt takes 1 tablet twice a week.)  . HYDROcodone-acetaminophen (NORCO/VICODIN) 5-325 MG tablet Take 1 tablet by mouth every 6 (six) hours as needed for moderate pain (restless legs).  Marland Kitchen levothyroxine (SYNTHROID, LEVOTHROID) 75 MCG tablet Take 75 mcg by mouth every morning.   . metoprolol tartrate (LOPRESSOR) 25 MG tablet TAKE (1/2)  TABLET BY MOUTH TWICE DAILY.  . Multiple Vitamins-Minerals (MULTIVITAMIN WITH MINERALS) tablet Take 1 tablet by mouth daily.   . Omega-3 Fatty Acids (FISH OIL) 1200 MG CAPS Take 1,200 mg by mouth daily.    No facility-administered encounter medications on file as of 09/28/2016.     Allergy: No Known Allergies  Social Hx:   Social History   Social History  . Marital status: Single    Spouse name: N/A  . Number of children: N/A  . Years of education: N/A   Occupational History  . Not on file.   Social History Main Topics  . Smoking status: Never Smoker  . Smokeless tobacco: Never Used  . Alcohol use No  . Drug use: No  . Sexual activity: No     Comment: HYST   Other Topics Concern  . 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 APPENDECTOMY    . APPLICATION OF A-CELL OF CHEST/ABDOMEN N/A 11/05/2013   Procedure: ABDOMINAL WALL RESCONTRUCTION WITH STRATUS;  Surgeon: Irene Limbo, MD;  Location: [facility];  Service: Plastics;  Laterality: N/A;  . CHOLECYSTECTOMY  2009  . LAPAROTOMY N/A 11/05/2013   Procedure: RESECTION OF PELVIC MASS;  Surgeon: Imagene Gurney A. Alycia Rossetti, MD;  Location: [facility];  Service: Gynecology;  Laterality: N/A;  .  OOPHORECTOMY     BSO  . TONSILLECTOMY  as child  . TOTAL ABDOMINAL HYSTERECTOMY  1988   ovarian cyst  BSO    Past Medical Hx:  Past Medical History:  Diagnosis Date  . Anxiety    new dx  . Cancer ([place]) 1988, 2015   ovarian, adenocarcinoma  . GERD (gastroesophageal reflux disease)    hx of years ago  . Hx of radiation therapy 01/16/14-02/27/14   abdomen   . Hypercholesteremia    under control with diet and fish oil  . Hypertension   . Ovarian cyst   . PONV (postoperative nausea and  vomiting)   . Restless leg syndrome   . Thyroid disease     Oncology Hx:    Adenocarcinoma of abdominal wall, unclear primary   08/09/2013 Initial Diagnosis    Adenocarcinoma of abdominal wall, unclear primary      08/16/2013 - 09/25/2013 Chemotherapy    paclitaxel and carboplatin      11/05/2013 Surgery    resection abdominal wall mass       - 02/27/2014 Radiation Therapy    Completed volume directed radiation       Family Hx:  Family History  Problem Relation Age of Onset  . Heart disease Mother   . Heart disease Sister   . Diabetes Sister   . Cancer Sister        melanoma-skin cancer  . Breast cancer Paternal Grandmother        Age 9's  . Cancer Paternal Grandmother        breast  . Cancer Paternal Grandfather        prostate/bladder    Vitals:  Blood pressure 125/62, pulse (!) 56, temperature 97.7 F (36.5 C), temperature source Oral, resp. rate 20, weight 138 lb (62.6 kg).  Physical Exam: Well-nourished well-developed female who appears younger than stated age in no acute distress.   Neck: Supple, no lymphadenopathy, no thyromegaly.  Lungs: Clear to auscultation bilateral.  Cardiovascular: Regular rate and rhythm.  Abdomen: Well-healed transverse Pfannenstiel skin incision. There is no fluid wave is no other palpable masses. There is no hepatomegaly. There is no evidence of any incisional hernias.  Groins: No lymphadenopathy.  Extremities: No edema.  Pelvic: Normal female genitalia. Unidigital pelvic examination reveals no masses or nodularity. Rectal confirms.  Assessment/Plan: 71 year old with a history of adenocarcinoma within the left ovary found incidentally at the time of a total abdominal hysterectomy bilateral salpingo-oophorectomy. This was in 1988. She did not require any additional treatment. I do not have a final pathology report. This was an incidental note made in her discharge summary. We treated this as an ovarian cancer recurrence with  a neoadjuvant approach.  She did not have a significant improvement in the size of the mass nor in her tumor markers with adjuvant chemotherapy. Therefore, she underwent surgical resection. She there was a positive margin and she underwent volume directed radiation.   She's been doing well since then. We will follow-up in results of her CA-125 from today.   She will follow-up with Dr. Benay Spice as scheduled in October of this year return to see me in approximately 4 months after that. We do not have our scheduled that far out. She was given a reminder card and knows to contact us o that we can schedule her follow up visit.   Dwayna Kentner A., MD 09/28/2016, 12:45 PM

## 2016-09-28 NOTE — Patient Instructions (Signed)
Follow-up with Dr. Benay Spice as scheduled in October of this year return to see me in approximately 4 months after that. We do not have our scheduled that far out, so please contact us as "closer to that day so we can schedule your follow-up appointment. We will notify you of the results of your bloodwork from today.

## 2016-09-29 ENCOUNTER — Telehealth: Payer: Self-pay

## 2016-09-29 LAB — CA 125: Cancer Antigen (CA) 125: 16.9 U/mL (ref 0.0–38.1)

## 2016-09-29 NOTE — Telephone Encounter (Signed)
Told [name] that her CA-125 was normal/stable at 16.9 per Joylene John, NP.

## 2016-09-30 ENCOUNTER — Telehealth: Payer: Self-pay | Admitting: Gynecologic Oncology

## 2016-09-30 ENCOUNTER — Telehealth: Payer: Self-pay | Admitting: *Deleted

## 2016-09-30 NOTE — Telephone Encounter (Signed)
-----   Message from Ladell Pier, MD sent at 09/29/2016  9:13 PM EDT ----- Please call patient, ca125 is normal

## 2016-09-30 NOTE — Telephone Encounter (Signed)
Error

## 2016-09-30 NOTE — Telephone Encounter (Signed)
Notified pt of normal CA125.

## 2016-11-25 ENCOUNTER — Other Ambulatory Visit: Payer: Self-pay | Admitting: Cardiology

## 2016-12-01 DIAGNOSIS — E032 Hypothyroidism due to medicaments and other exogenous substances: Secondary | ICD-10-CM | POA: Diagnosis not present

## 2016-12-06 DIAGNOSIS — E039 Hypothyroidism, unspecified: Secondary | ICD-10-CM | POA: Diagnosis not present

## 2016-12-26 DIAGNOSIS — Z7901 Long term (current) use of anticoagulants: Secondary | ICD-10-CM | POA: Diagnosis not present

## 2016-12-26 DIAGNOSIS — Z6821 Body mass index (BMI) 21.0-21.9, adult: Secondary | ICD-10-CM | POA: Diagnosis not present

## 2016-12-26 DIAGNOSIS — E063 Autoimmune thyroiditis: Secondary | ICD-10-CM | POA: Diagnosis not present

## 2016-12-26 DIAGNOSIS — I4891 Unspecified atrial fibrillation: Secondary | ICD-10-CM | POA: Diagnosis not present

## 2016-12-26 DIAGNOSIS — F419 Anxiety disorder, unspecified: Secondary | ICD-10-CM | POA: Diagnosis not present

## 2016-12-26 DIAGNOSIS — G2581 Restless legs syndrome: Secondary | ICD-10-CM | POA: Diagnosis not present

## 2016-12-26 DIAGNOSIS — E782 Mixed hyperlipidemia: Secondary | ICD-10-CM | POA: Diagnosis not present

## 2016-12-26 DIAGNOSIS — Z0001 Encounter for general adult medical examination with abnormal findings: Secondary | ICD-10-CM | POA: Diagnosis not present

## 2016-12-26 DIAGNOSIS — Z1389 Encounter for screening for other disorder: Secondary | ICD-10-CM | POA: Diagnosis not present

## 2016-12-27 DIAGNOSIS — Z6821 Body mass index (BMI) 21.0-21.9, adult: Secondary | ICD-10-CM | POA: Diagnosis not present

## 2016-12-27 DIAGNOSIS — E782 Mixed hyperlipidemia: Secondary | ICD-10-CM | POA: Diagnosis not present

## 2016-12-27 DIAGNOSIS — Z1389 Encounter for screening for other disorder: Secondary | ICD-10-CM | POA: Diagnosis not present

## 2016-12-27 DIAGNOSIS — E063 Autoimmune thyroiditis: Secondary | ICD-10-CM | POA: Diagnosis not present

## 2017-01-04 ENCOUNTER — Ambulatory Visit: Payer: Medicare Other | Admitting: Cardiology

## 2017-02-14 ENCOUNTER — Encounter: Payer: Self-pay | Admitting: Cardiology

## 2017-02-14 ENCOUNTER — Encounter: Payer: Self-pay | Admitting: *Deleted

## 2017-02-14 ENCOUNTER — Ambulatory Visit (INDEPENDENT_AMBULATORY_CARE_PROVIDER_SITE_OTHER): Payer: Medicare Other | Admitting: Cardiology

## 2017-02-14 VITALS — BP 130/80 | HR 72 | Ht 66.0 in | Wt 137.0 lb

## 2017-02-14 DIAGNOSIS — I480 Paroxysmal atrial fibrillation: Secondary | ICD-10-CM

## 2017-02-14 NOTE — Progress Notes (Signed)
Clinical Summary [name] is a 71 y.o.female seen today for follow up of the following medical problems.   1. Afib - new diagnosis during 12/2014 admission, presented with palpitations and left arm pain - converted back to NSR while admitted, discharged on low dose lopressor, higher doses caused some low heart rates and soft bp's.  - echo LVEF 55-60%, mod LAE.    - denies any recent palpitations. Compliant with meds, no bleeding on eliquis.   2. Metatstic Adenocardinoma Ovarian - followed by oncology   SH: works as Solicitor  Past Medical History:  Diagnosis Date  . Anxiety    new dx  . Cancer ([place]) 1988, 2015   ovarian, adenocarcinoma  . GERD (gastroesophageal reflux disease)    hx of years ago  . Hx of radiation therapy 01/16/14-02/27/14   abdomen   . Hypercholesteremia    under control with diet and fish oil  . Hypertension   . Ovarian cyst   . PONV (postoperative nausea and vomiting)   . Restless leg syndrome   . Thyroid disease      No Known Allergies   Current Outpatient Prescriptions  Medication Sig Dispense Refill  . ALPRAZolam (XANAX) 0.25 MG tablet Take 0.25 mg by mouth at bedtime as needed for sleep.     Marland Kitchen apixaban (ELIQUIS) 5 MG TABS tablet TAKE (1) TABLET BY MOUTH TWICE DAILY. 180 tablet 3  . Calcium Carb-Cholecalciferol (CALCIUM 600 + D PO) Take 1 tablet by mouth daily.    . Cholecalciferol (EQL VITAMIN D3) 1000 UNITS tablet Take 1,000 Units by mouth daily.    Marland Kitchen estrogens, conjugated, (PREMARIN) 0.625 MG tablet Take 1 tablet (0.625 mg total) by mouth See admin instructions. Take daily for 21 days then do not take for 7 days. (Patient taking differently: Take 0.625 mg by mouth See admin instructions. Pt takes 1 tablet twice a week.) 30 tablet 6  . HYDROcodone-acetaminophen (NORCO/VICODIN) 5-325 MG tablet Take 1 tablet by mouth every 6 (six) hours as needed for moderate pain (restless legs). 30 tablet 0  . levothyroxine (SYNTHROID,  LEVOTHROID) 75 MCG tablet Take 75 mcg by mouth every morning.     . metoprolol tartrate (LOPRESSOR) 25 MG tablet TAKE (1/2) TABLET BY MOUTH TWICE DAILY. 30 tablet 3  . Multiple Vitamins-Minerals (MULTIVITAMIN WITH MINERALS) tablet Take 1 tablet by mouth daily.     . Omega-3 Fatty Acids (FISH OIL) 1200 MG CAPS Take 1,200 mg by mouth daily.      No current facility-administered medications for this visit.      Past Surgical History:  Procedure Laterality Date  . APPENDECTOMY    . APPLICATION OF A-CELL OF CHEST/ABDOMEN N/A 11/05/2013   Procedure: ABDOMINAL WALL RESCONTRUCTION WITH STRATUS;  Surgeon: Irene Limbo, MD;  Location: [facility];  Service: Plastics;  Laterality: N/A;  . CHOLECYSTECTOMY  2009  . LAPAROTOMY N/A 11/05/2013   Procedure: RESECTION OF PELVIC MASS;  Surgeon: Imagene Gurney A. Alycia Rossetti, MD;  Location: [facility];  Service: Gynecology;  Laterality: N/A;  . OOPHORECTOMY     BSO  . TONSILLECTOMY  as child  . TOTAL ABDOMINAL HYSTERECTOMY  1988   ovarian cyst  BSO     No Known Allergies    Family History  Problem Relation Age of Onset  . Heart disease Mother   . Heart disease Sister   . Diabetes Sister   . Cancer Sister        melanoma-skin cancer  . Breast cancer  Paternal Grandmother        Age 32's  . Cancer Paternal Grandmother        breast  . Cancer Paternal Grandfather        prostate/bladder     Social History Ms. Lupu reports that she has never smoked. She has never used smokeless tobacco. Ms. Handley reports that she does not drink alcohol.   Review of Systems CONSTITUTIONAL: No weight loss, fever, chills, weakness or fatigue.  HEENT: Eyes: No visual loss, blurred vision, double vision or yellow sclerae.No hearing loss, sneezing, congestion, runny nose or sore throat.  SKIN: No rash or itching.  CARDIOVASCULAR: per hpi RESPIRATORY: No shortness of breath, cough or sputum.  GASTROINTESTINAL: No anorexia, nausea, vomiting or diarrhea. No abdominal pain or  blood.  GENITOURINARY: No burning on urination, no polyuria NEUROLOGICAL: No headache, dizziness, syncope, paralysis, ataxia, numbness or tingling in the extremities. No change in bowel or bladder control.  MUSCULOSKELETAL: No muscle, back pain, joint pain or stiffness.  LYMPHATICS: No enlarged nodes. No history of splenectomy.  PSYCHIATRIC: No history of depression or anxiety.  ENDOCRINOLOGIC: No reports of sweating, cold or heat intolerance. No polyuria or polydipsia.  Marland Kitchen   Physical Examination Vitals:   02/14/17 1400  BP: 130/80  Pulse: 72  SpO2: 97%   Vitals:   02/14/17 1400  Weight: 137 lb (62.1 kg)  Height: 5\' 6"  (1.676 m)    Gen: resting comfortably, no acute distress HEENT: no scleral icterus, pupils equal round and reactive, no palptable cervical adenopathy,  CV: RRR, no m/rg, no jvd Resp: Clear to auscultation bilaterally GI: abdomen is soft, non-tender, non-distended, normal bowel sounds, no hepatosplenomegaly MSK: extremities are warm, no edema.  Skin: warm, no rash Neuro:  no focal deficits Psych: appropriate affect   Diagnostic Studies 12/2014 echo Study Conclusions  - Left ventricle: The cavity size was normal. Wall thickness was normal. Systolic function was normal. The estimated ejection fraction was in the range of 55% to 60%. Wall motion was normal; there were no regional wall motion abnormalities. Left ventricular diastolic function parameters were normal. - Mitral valve: Mildly calcified annulus. There was mild regurgitation. - Left atrium: The atrium was moderately dilated. Volume/bsa, S: 37.9 ml/m^2. - Right atrium: The atrium was mildly to moderately dilated. - Tricuspid valve: There was mild-moderate regurgitation.    Assessment and Plan   1. PAF  She will conitnue eliquis, CHADS2Vasc score is 2.  - no recent symptoms, continue current meds - ekg in clinic today shows NSR    Arnoldo Lenis, M.D.

## 2017-02-14 NOTE — Patient Instructions (Signed)

## 2017-02-16 ENCOUNTER — Telehealth: Payer: Self-pay | Admitting: Oncology

## 2017-02-16 ENCOUNTER — Ambulatory Visit (HOSPITAL_BASED_OUTPATIENT_CLINIC_OR_DEPARTMENT_OTHER): Payer: Medicare Other

## 2017-02-16 ENCOUNTER — Ambulatory Visit (HOSPITAL_BASED_OUTPATIENT_CLINIC_OR_DEPARTMENT_OTHER): Payer: Medicare Other | Admitting: Oncology

## 2017-02-16 VITALS — BP 118/51 | HR 60 | Temp 97.6°F | Resp 18 | Ht 66.0 in | Wt 136.9 lb

## 2017-02-16 DIAGNOSIS — C569 Malignant neoplasm of unspecified ovary: Secondary | ICD-10-CM

## 2017-02-16 DIAGNOSIS — I4891 Unspecified atrial fibrillation: Secondary | ICD-10-CM | POA: Diagnosis not present

## 2017-02-16 DIAGNOSIS — G2581 Restless legs syndrome: Secondary | ICD-10-CM | POA: Diagnosis not present

## 2017-02-16 DIAGNOSIS — C792 Secondary malignant neoplasm of skin: Secondary | ICD-10-CM | POA: Diagnosis not present

## 2017-02-16 NOTE — Progress Notes (Signed)
  [place] OFFICE PROGRESS NOTE   Diagnosis: Metastatic adenocarcinoma  INTERVAL HISTORY:   Ms. Trawick returns as scheduled. She feels well. She continues to have "restless legs ". This is her only complaint.  Objective:  Vital signs in last 24 hours:  Blood pressure (!) 118/51, pulse 60, temperature 97.6 F (36.4 C), temperature source Oral, resp. rate 18, height 5\' 6"  (1.676 m), weight 136 lb 14.4 oz (62.1 kg), SpO2 100 %.    HEENT: Neck without mass slight fullness in the right compared to the left lower neck and supra-clavicular fossa without a discrete mass Lymphatics: No cervical, supraclavicular, axillary, or inguinal nodes Resp: Lungs clear bilaterally Cardio: Regular rate and rhythm GI: No hepatomegaly, no mass, nontender. No apparent ascites. No evidence of recurrent tumor at the low transverse scar Vascular: No leg edema   Lab Results:  09/28/2016: CA 125-16.9 Medications: I have reviewed the patient's current medications.  Assessment/Plan: 1. 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  Staging CTs of the chest, abdomen, and pelvis with no other site of metastatic disease or a primary tumor.   Elevated CA 125.   Cycle 1 Taxol/carboplatin 08/16/2013.   Cycle 2 Taxol/carboplatin 09/06/2013.   Cycle 3 Taxol/carboplatin 09/27/2013   CT 10/11/2013 with a stable abdominal wall mass.  Status post resection of abdominal wall mass with abdominal wall reconstruction 11/05/2013. Final pathology showed adenocarcinoma. Specimen extensively involved with adenocarcinoma which focally involved the edge of the specimen. Immunostains and Foundation 1 testing were nonspecific for a primary tumor location.  Adjuvant radiation to the abdominal wall 01/16/2014 through 02/27/2014 2. Ovarian cancer in 1988, low-grade adenocarcinoma with areas of "borderline" carcinoma, status post a hysterectomy and bilateral oophorectomy. 3. New onset  atrial fibrillation August 2016, maintained on eliquis  Disposition:  Ms. Kriegel remains in clinical remission from ovarian cancer. She would like to continue follow-up at the [facility]. She will return for an office visit and CA 125 in 6 months. We will check the CA 125 today.  She plans to continue yearly follow-up with Dr. Alycia Rossetti.  15 minutes were spent with the patient today. The majority of the time was used for counseling and coordination of care.  Donneta Romberg, MD  02/16/2017  3:25 PM

## 2017-02-16 NOTE — Telephone Encounter (Signed)
Scheduled appt per 10/4 los - patient to get new schedule in the lab - per Maudie Mercury in lab said she will print it out for patient .

## 2017-02-17 ENCOUNTER — Telehealth: Payer: Self-pay | Admitting: *Deleted

## 2017-02-17 LAB — CA 125: CANCER ANTIGEN (CA) 125: 19.4 U/mL (ref 0.0–38.1)

## 2017-02-17 NOTE — Telephone Encounter (Signed)
Notified pt of normal result. She voiced appreciation for call.

## 2017-02-17 NOTE — Telephone Encounter (Signed)
-----   Message from Ladell Pier, MD sent at 02/17/2017  7:46 AM EDT ----- Please call patient, ca125 is normal

## 2017-02-21 ENCOUNTER — Telehealth: Payer: Self-pay | Admitting: *Deleted

## 2017-02-21 NOTE — Telephone Encounter (Signed)
[facility] Dr Watt Climes requesting when pt should stop/restart Eliquis after colonoscopy. Routed to Dr Bronson Ing with Dr Harl Bowie out of office.

## 2017-02-21 NOTE — Telephone Encounter (Signed)
Printed and faxed this note with clearance form to [facility]

## 2017-02-21 NOTE — Telephone Encounter (Signed)
Can hold the day before and morning of procedure. Restart as soon as feasible as per GI.

## 2017-03-02 DIAGNOSIS — E032 Hypothyroidism due to medicaments and other exogenous substances: Secondary | ICD-10-CM | POA: Diagnosis not present

## 2017-03-06 DIAGNOSIS — L821 Other seborrheic keratosis: Secondary | ICD-10-CM | POA: Diagnosis not present

## 2017-03-06 DIAGNOSIS — D225 Melanocytic nevi of trunk: Secondary | ICD-10-CM | POA: Diagnosis not present

## 2017-03-06 DIAGNOSIS — D1801 Hemangioma of skin and subcutaneous tissue: Secondary | ICD-10-CM | POA: Diagnosis not present

## 2017-03-06 DIAGNOSIS — B009 Herpesviral infection, unspecified: Secondary | ICD-10-CM | POA: Diagnosis not present

## 2017-03-06 DIAGNOSIS — L570 Actinic keratosis: Secondary | ICD-10-CM | POA: Diagnosis not present

## 2017-03-06 DIAGNOSIS — L814 Other melanin hyperpigmentation: Secondary | ICD-10-CM | POA: Diagnosis not present

## 2017-03-14 DIAGNOSIS — E039 Hypothyroidism, unspecified: Secondary | ICD-10-CM | POA: Diagnosis not present

## 2017-03-30 DIAGNOSIS — K5730 Diverticulosis of large intestine without perforation or abscess without bleeding: Secondary | ICD-10-CM | POA: Diagnosis not present

## 2017-03-30 DIAGNOSIS — Z86010 Personal history of colonic polyps: Secondary | ICD-10-CM | POA: Diagnosis not present

## 2017-04-13 ENCOUNTER — Other Ambulatory Visit: Payer: Self-pay | Admitting: Oncology

## 2017-04-13 DIAGNOSIS — Z1231 Encounter for screening mammogram for malignant neoplasm of breast: Secondary | ICD-10-CM

## 2017-04-18 ENCOUNTER — Other Ambulatory Visit: Payer: Self-pay | Admitting: Cardiology

## 2017-05-04 DIAGNOSIS — F419 Anxiety disorder, unspecified: Secondary | ICD-10-CM | POA: Diagnosis not present

## 2017-05-04 DIAGNOSIS — Z6822 Body mass index (BMI) 22.0-22.9, adult: Secondary | ICD-10-CM | POA: Diagnosis not present

## 2017-05-04 DIAGNOSIS — G894 Chronic pain syndrome: Secondary | ICD-10-CM | POA: Diagnosis not present

## 2017-05-05 ENCOUNTER — Telehealth: Payer: Self-pay | Admitting: *Deleted

## 2017-05-05 NOTE — Telephone Encounter (Signed)
Patient assistance form faxed and mailed to [facility]

## 2017-05-10 ENCOUNTER — Ambulatory Visit
Admission: RE | Admit: 2017-05-10 | Discharge: 2017-05-10 | Disposition: A | Discharge status: Home / Self Care | Payer: Medicare Other | Source: Ambulatory Visit | Attending: Oncology | Admitting: Oncology

## 2017-05-10 DIAGNOSIS — Z1231 Encounter for screening mammogram for malignant neoplasm of breast: Secondary | ICD-10-CM

## 2017-06-01 DIAGNOSIS — E039 Hypothyroidism, unspecified: Secondary | ICD-10-CM | POA: Diagnosis not present

## 2017-06-01 DIAGNOSIS — I959 Hypotension, unspecified: Secondary | ICD-10-CM | POA: Diagnosis not present

## 2017-08-15 ENCOUNTER — Other Ambulatory Visit: Payer: Self-pay | Admitting: Cardiology

## 2017-08-17 ENCOUNTER — Telehealth: Payer: Self-pay | Admitting: Oncology

## 2017-08-17 ENCOUNTER — Inpatient Hospital Stay: Payer: Medicare Other

## 2017-08-17 ENCOUNTER — Inpatient Hospital Stay: Payer: Medicare Other | Attending: Oncology | Admitting: Oncology

## 2017-08-17 VITALS — BP 115/60 | HR 57 | Temp 98.1°F | Resp 18 | Ht 66.0 in | Wt 141.9 lb

## 2017-08-17 DIAGNOSIS — C569 Malignant neoplasm of unspecified ovary: Secondary | ICD-10-CM

## 2017-08-17 DIAGNOSIS — Z9221 Personal history of antineoplastic chemotherapy: Secondary | ICD-10-CM

## 2017-08-17 DIAGNOSIS — Z90710 Acquired absence of both cervix and uterus: Secondary | ICD-10-CM

## 2017-08-17 DIAGNOSIS — Z8543 Personal history of malignant neoplasm of ovary: Secondary | ICD-10-CM | POA: Diagnosis not present

## 2017-08-17 DIAGNOSIS — Z90722 Acquired absence of ovaries, bilateral: Secondary | ICD-10-CM

## 2017-08-17 NOTE — Telephone Encounter (Signed)
Appointments scheduled AVS/Calendar printed per 4/4 los

## 2017-08-17 NOTE — Progress Notes (Signed)
  [place] OFFICE PROGRESS NOTE   Diagnosis: Metastatic adenocarcinoma  INTERVAL HISTORY:   [name] returns as scheduled.  She feels well.  No complaint.  Objective:  Vital signs in last 24 hours:  Blood pressure 115/60, pulse (!) 57, temperature 98.1 F (36.7 C), temperature source Oral, resp. rate 18, height 5\' 6"  (1.676 m), weight 141 lb 14.4 oz (64.4 kg), SpO2 100 %.    HEENT: Neck without mass, slight fullness in the right compared to the left supraclavicular fossa without a discrete mass Lymphatics: No cervical, supraclavicular, axillary, or inguinal nodes Resp: Lungs clear bilaterally Cardio: Regular rate and rhythm with an occasional pause GI: No hepatospleno megaly, no mass, no apparent ascites, low transverse scar without evidence of recurrent tumor, slight subcutaneous linear nodularity at the left suprapubic area Vascular: No leg edema   Lab Results:  CA 125 pending   Medications: I have reviewed the patient's current medications.   Assessment/Plan: 1. 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   Elevated CA 125.   Cycle 1 Taxol/carboplatin 08/16/2013.   Cycle 2 Taxol/carboplatin 09/06/2013.   Cycle 3 Taxol/carboplatin 09/27/2013   CT 10/11/2013 with a stable abdominal wall mass.  Status post resection of abdominal wall mass with abdominal wall reconstruction 11/05/2013. Final pathology showed adenocarcinoma. Specimen extensively involved with adenocarcinoma which focally involved the edge of the specimen. Immunostains and Foundation 1 testing were nonspecific for a primary tumor location.  Adjuvant radiation to the abdominal wall 01/16/2014 through 02/27/2014 2. Ovarian cancer in 1988, low-grade adenocarcinoma with areas of "borderline" carcinoma, status post a hysterectomy and bilateral  oophorectomy. 3. New onset atrial fibrillation August 2016, maintained on eliquis      Disposition: [name] appears well.  No clinical evidence for recurrence of the metastatic adenocarcinoma.  We will follow-up on the CA 125 from today.  She will return for an office visit in 6 months.  15 minutes were spent with the patient today.  The majority of the time was used for counseling and coordination of care.  [name], MD  08/17/2017  3:19 PM

## 2017-08-18 ENCOUNTER — Telehealth: Payer: Self-pay | Admitting: *Deleted

## 2017-08-18 LAB — CA 125: Cancer Antigen (CA) 125: 19.2 U/mL (ref 0.0–38.1)

## 2017-08-18 NOTE — Telephone Encounter (Signed)
Notified pt of normal CA125 result. She voiced appreciation for call.

## 2017-08-18 NOTE — Telephone Encounter (Signed)
-----   Message from Ladell Pier, MD sent at 08/18/2017  7:37 AM EDT ----- Please call patient, ca125 is normal

## 2017-09-08 DIAGNOSIS — H25099 Other age-related incipient cataract, unspecified eye: Secondary | ICD-10-CM | POA: Diagnosis not present

## 2017-09-08 DIAGNOSIS — H52222 Regular astigmatism, left eye: Secondary | ICD-10-CM | POA: Diagnosis not present

## 2017-09-08 DIAGNOSIS — H524 Presbyopia: Secondary | ICD-10-CM | POA: Diagnosis not present

## 2017-09-08 DIAGNOSIS — H5213 Myopia, bilateral: Secondary | ICD-10-CM | POA: Diagnosis not present

## 2017-09-28 DIAGNOSIS — E049 Nontoxic goiter, unspecified: Secondary | ICD-10-CM | POA: Diagnosis not present

## 2017-09-29 DIAGNOSIS — E049 Nontoxic goiter, unspecified: Secondary | ICD-10-CM | POA: Diagnosis not present

## 2017-10-03 DIAGNOSIS — N3000 Acute cystitis without hematuria: Secondary | ICD-10-CM | POA: Diagnosis not present

## 2017-10-03 DIAGNOSIS — R8271 Bacteriuria: Secondary | ICD-10-CM | POA: Diagnosis not present

## 2017-11-15 ENCOUNTER — Other Ambulatory Visit: Payer: Self-pay | Admitting: Cardiology

## 2017-12-04 DIAGNOSIS — E782 Mixed hyperlipidemia: Secondary | ICD-10-CM | POA: Diagnosis not present

## 2017-12-04 DIAGNOSIS — G894 Chronic pain syndrome: Secondary | ICD-10-CM | POA: Diagnosis not present

## 2017-12-04 DIAGNOSIS — Z6822 Body mass index (BMI) 22.0-22.9, adult: Secondary | ICD-10-CM | POA: Diagnosis not present

## 2017-12-04 DIAGNOSIS — E063 Autoimmune thyroiditis: Secondary | ICD-10-CM | POA: Diagnosis not present

## 2017-12-04 DIAGNOSIS — E559 Vitamin D deficiency, unspecified: Secondary | ICD-10-CM | POA: Diagnosis not present

## 2017-12-04 DIAGNOSIS — F419 Anxiety disorder, unspecified: Secondary | ICD-10-CM | POA: Diagnosis not present

## 2017-12-04 DIAGNOSIS — G2581 Restless legs syndrome: Secondary | ICD-10-CM | POA: Diagnosis not present

## 2017-12-04 DIAGNOSIS — I4891 Unspecified atrial fibrillation: Secondary | ICD-10-CM | POA: Diagnosis not present

## 2017-12-04 DIAGNOSIS — Z1389 Encounter for screening for other disorder: Secondary | ICD-10-CM | POA: Diagnosis not present

## 2017-12-27 DIAGNOSIS — E559 Vitamin D deficiency, unspecified: Secondary | ICD-10-CM | POA: Diagnosis not present

## 2017-12-27 DIAGNOSIS — G2581 Restless legs syndrome: Secondary | ICD-10-CM | POA: Diagnosis not present

## 2017-12-27 DIAGNOSIS — Z1389 Encounter for screening for other disorder: Secondary | ICD-10-CM | POA: Diagnosis not present

## 2017-12-27 DIAGNOSIS — R7309 Other abnormal glucose: Secondary | ICD-10-CM | POA: Diagnosis not present

## 2017-12-27 DIAGNOSIS — F419 Anxiety disorder, unspecified: Secondary | ICD-10-CM | POA: Diagnosis not present

## 2017-12-27 DIAGNOSIS — E063 Autoimmune thyroiditis: Secondary | ICD-10-CM | POA: Diagnosis not present

## 2017-12-28 DIAGNOSIS — Z1389 Encounter for screening for other disorder: Secondary | ICD-10-CM | POA: Diagnosis not present

## 2017-12-28 DIAGNOSIS — Z6822 Body mass index (BMI) 22.0-22.9, adult: Secondary | ICD-10-CM | POA: Diagnosis not present

## 2017-12-28 DIAGNOSIS — Z0001 Encounter for general adult medical examination with abnormal findings: Secondary | ICD-10-CM | POA: Diagnosis not present

## 2018-01-12 ENCOUNTER — Other Ambulatory Visit: Payer: Self-pay | Admitting: Cardiology

## 2018-01-12 NOTE — Telephone Encounter (Signed)
Patient calling the office for samples of medication:   1.  What medication and dosage are you requesting samples for?  Eliquis   2.  Are you currently out of this medication? no

## 2018-01-13 ENCOUNTER — Encounter (HOSPITAL_COMMUNITY): Payer: Self-pay | Admitting: Emergency Medicine

## 2018-01-13 ENCOUNTER — Emergency Department (HOSPITAL_COMMUNITY)
Admission: EM | Admit: 2018-01-13 | Discharge: 2018-01-13 | Disposition: A | Discharge status: Home / Self Care | Payer: Medicare Other | Attending: Emergency Medicine | Admitting: Emergency Medicine

## 2018-01-13 ENCOUNTER — Other Ambulatory Visit: Payer: Self-pay

## 2018-01-13 DIAGNOSIS — Z79899 Other long term (current) drug therapy: Secondary | ICD-10-CM | POA: Insufficient documentation

## 2018-01-13 DIAGNOSIS — Z8543 Personal history of malignant neoplasm of ovary: Secondary | ICD-10-CM | POA: Diagnosis not present

## 2018-01-13 DIAGNOSIS — E039 Hypothyroidism, unspecified: Secondary | ICD-10-CM | POA: Diagnosis not present

## 2018-01-13 DIAGNOSIS — R350 Frequency of micturition: Secondary | ICD-10-CM | POA: Diagnosis present

## 2018-01-13 DIAGNOSIS — R319 Hematuria, unspecified: Secondary | ICD-10-CM | POA: Diagnosis not present

## 2018-01-13 DIAGNOSIS — I10 Essential (primary) hypertension: Secondary | ICD-10-CM | POA: Insufficient documentation

## 2018-01-13 DIAGNOSIS — N390 Urinary tract infection, site not specified: Secondary | ICD-10-CM | POA: Insufficient documentation

## 2018-01-13 DIAGNOSIS — Z923 Personal history of irradiation: Secondary | ICD-10-CM | POA: Diagnosis not present

## 2018-01-13 LAB — URINALYSIS, ROUTINE W REFLEX MICROSCOPIC
BILIRUBIN URINE: NEGATIVE
Glucose, UA: NEGATIVE mg/dL
KETONES UR: NEGATIVE mg/dL
Nitrite: NEGATIVE
PROTEIN: NEGATIVE mg/dL
Specific Gravity, Urine: 1.003 — ABNORMAL LOW (ref 1.005–1.030)
pH: 7 (ref 5.0–8.0)

## 2018-01-13 MED ORDER — FLUCONAZOLE 150 MG PO TABS: 150.0000 mg | ORAL_TABLET | Freq: Once | ORAL | 0 refills | Status: DC | PRN

## 2018-01-13 MED ORDER — CEPHALEXIN 500 MG PO CAPS: 500.0000 mg | ORAL_CAPSULE | Freq: Three times a day (TID) | ORAL | 0 refills | Status: DC

## 2018-01-13 MED ORDER — CEPHALEXIN 500 MG PO CAPS
500.0000 mg | ORAL_CAPSULE | Freq: Once | ORAL | Status: AC
  Administered 2018-01-13: 500 mg via ORAL
  Filled 2018-01-13: qty 1

## 2018-01-13 NOTE — ED Provider Notes (Signed)
[facility] EMERGENCY DEPARTMENT Provider Note   CSN: [id number] Arrival date & time: 01/13/18  2015     History   Chief Complaint Chief Complaint  Patient presents with  . Urinary Tract Infection    HPI [name] is a 72 y.o. female.  HPI Patient thinks she may have a inadequate tract infection.  States she is had increased urinary frequency over the last week and has developed low back pain.  States is her similar symptoms when she had a urinary tract infection 3 months ago.  She took Bactrim at the time with complete resolution of her symptoms.  She denies any fever or chills.  No nausea vomiting.  No abdominal pain.  No dysuria or hematuria. Past Medical History:  Diagnosis Date  . Anxiety    new dx  . Cancer ([place]) 1988, 2015   ovarian, adenocarcinoma  . GERD (gastroesophageal reflux disease)    hx of years ago  . Hx of radiation therapy 01/16/14-02/27/14   abdomen   . Hypercholesteremia    under control with diet and fish oil  . Hypertension   . Ovarian cyst   . PONV (postoperative nausea and vomiting)   . Restless leg syndrome   . Thyroid disease     Patient Active Problem List   Diagnosis Date Noted  . Atrial fibrillation ([place]) 01/13/2015  . Chest pain 01/13/2015  . Ovarian ca ([place]) 11/05/2013  . 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 Neoplasm of abdominal wall of uncertain behavior 78/29/5621  . Hypothyroidism     Past Surgical History:  Procedure Laterality Date  . APPENDECTOMY    . APPLICATION OF A-CELL OF CHEST/ABDOMEN N/A 11/05/2013   Procedure: ABDOMINAL WALL RESCONTRUCTION WITH STRATUS;  Surgeon: Irene Limbo, MD;  Location: [facility] ORS;  Service: Plastics;  Laterality: N/A;  . CHOLECYSTECTOMY  2009  . LAPAROTOMY N/A 11/05/2013   Procedure: RESECTION OF PELVIC MASS;  Surgeon: Imagene Gurney A. Alycia Rossetti, MD;  Location: [facility] ORS;  Service: Gynecology;  Laterality: N/A;  . OOPHORECTOMY     BSO  . TONSILLECTOMY  as child  . TOTAL  ABDOMINAL HYSTERECTOMY  1988   ovarian cyst  BSO     OB History    Gravida  0   Para      Term      Preterm      AB      Living        SAB      TAB      Ectopic      Multiple      Live Births               Home Medications    Prior to Admission medications   Medication Sig Start Date End Date Taking? Authorizing Provider  ALPRAZolam Duanne Moron) 0.25 MG tablet Take 0.25 mg by mouth at bedtime as needed for sleep.  08/27/14   [provider]  Calcium Carb-Cholecalciferol (CALCIUM 600 + D PO) Take 1 tablet by mouth daily.    [provider]  cephALEXin (KEFLEX) 500 MG capsule Take 1 capsule (500 mg total) by mouth 3 (three) times daily. 01/13/18   Julianne Rice, MD  Cholecalciferol (EQL VITAMIN D3) 1000 UNITS tablet Take 1,000 Units by mouth daily.    [provider]  ELIQUIS 5 MG TABS tablet TAKE (1) TABLET BY MOUTH TWICE DAILY. 11/15/17   Arnoldo Lenis, MD  estrogens, conjugated, (PREMARIN) 0.625 MG tablet Take 1 tablet (  0.625 mg total) by mouth See admin instructions. Take daily for 21 days then do not take for 7 days. Patient taking differently: Take 0.625 mg by mouth See admin instructions. Pt takes 1 tablet twice a week. 07/22/16   [name], MD  HYDROcodone-acetaminophen (NORCO/VICODIN) 5-325 MG tablet Take 1 tablet by mouth every 6 (six) hours as needed for moderate pain (restless legs). 03/17/16   Ladell Pier, MD  levothyroxine (SYNTHROID, LEVOTHROID) 75 MCG tablet Take 75 mcg by mouth every morning.     [provider]  metoprolol tartrate (LOPRESSOR) 25 MG tablet TAKE (1/2) TABLET BY MOUTH TWICE DAILY. 01/12/18   Arnoldo Lenis, MD  Multiple Vitamins-Minerals (MULTIVITAMIN WITH MINERALS) tablet Take 1 tablet by mouth daily.     [provider]  Omega-3 Fatty Acids (FISH OIL) 1200 MG CAPS Take 1,200 mg by mouth daily.     [provider]    Family History Family History  Problem Relation Age of  Onset  . Heart disease Mother   . Heart disease Sister   . Diabetes Sister   . Cancer Sister        melanoma-skin cancer  . Breast cancer Paternal Grandmother        Age 55's  . Cancer Paternal Grandmother        breast  . Cancer Paternal Grandfather        prostate/bladder    Social History Social History   Tobacco Use  . Smoking status: Never Smoker  . Smokeless tobacco: Never Used  Substance Use Topics  . Alcohol use: No    Alcohol/week: 0.0 standard drinks  . Drug use: No     Allergies   Patient has no known allergies.   Review of Systems Review of Systems  Constitutional: Negative for chills and fever.  Respiratory: Negative for cough and shortness of breath.   Cardiovascular: Negative for chest pain.  Gastrointestinal: Negative for abdominal pain, constipation, diarrhea, nausea and vomiting.  Genitourinary: Positive for flank pain and frequency. Negative for dysuria, hematuria and pelvic pain.  Musculoskeletal: Positive for back pain. Negative for myalgias, neck pain and neck stiffness.  Skin: Negative for rash and wound.  Neurological: Negative for dizziness, weakness, light-headedness, numbness and headaches.  All other systems reviewed and are negative.    Physical Exam Updated Vital Signs BP 139/74 (BP Location: Right Arm)   Pulse 86   Temp 98.8 F (37.1 C) (Oral)   Resp 18   Ht 5\' 6"  (1.676 m)   Wt 63.5 kg   SpO2 100%   BMI 22.60 kg/m   Physical Exam  Constitutional: She is oriented to person, place, and time. She appears well-developed and well-nourished.  HENT:  Head: Normocephalic and atraumatic.  Mouth/Throat: Oropharynx is clear and moist. No oropharyngeal exudate.  Eyes: Pupils are equal, round, and reactive to light. EOM are normal.  Neck: Normal range of motion. Neck supple.  Cardiovascular: Normal rate and regular rhythm. Exam reveals no gallop and no friction rub.  No murmur heard. Pulmonary/Chest: Effort normal and breath sounds  normal. No stridor. No respiratory distress. She has no wheezes. She has no rales. She exhibits no tenderness.  Abdominal: Soft. Bowel sounds are normal. There is no tenderness. There is no rebound and no guarding.  Musculoskeletal: Normal range of motion. She exhibits no edema or tenderness.  No midline thoracic or lumbar tenderness.  No CVA tenderness.  No lower extremity swelling, asymmetry or tenderness.  Neurological: She is alert  and oriented to person, place, and time.  5/5 motor all extremities.  Sensation fully intact.  Ambulating without difficulty.  Skin: Skin is warm and dry. No rash noted. No erythema.  Psychiatric: She has a normal mood and affect. Her behavior is normal.  Nursing note and vitals reviewed.    ED Treatments / Results  Labs (all labs ordered are listed, but only abnormal results are displayed) Labs Reviewed  URINALYSIS, ROUTINE W REFLEX MICROSCOPIC - Abnormal; Notable for the following components:      Result Value   APPearance HAZY (*)    Specific Gravity, Urine 1.003 (*)    Hgb urine dipstick MODERATE (*)    Leukocytes, UA LARGE (*)    Bacteria, UA RARE (*)    All other components within normal limits  URINE CULTURE    EKG None  Radiology No results found.  Procedures Procedures (including critical care time)  Medications Ordered in ED Medications  cephALEXin (KEFLEX) capsule 500 mg (has no administration in time range)     Initial Impression / Assessment and Plan / ED Course  I have reviewed the triage vital signs and the nursing notes.  Pertinent labs & imaging results that were available during my care of the patient were reviewed by me and considered in my medical decision making (see chart for details).     CVA tenderness.  Vital signs are stable.  No constitutional symptoms.  Doubt pyelonephritis.  Given first dose of Keflex in the emergency department.  Will send urine for culture.  Advised to follow-up with her primary physician  and return precautions given.  Final Clinical Impressions(s) / ED Diagnoses   Final diagnoses:  Urinary tract infection with hematuria, site unspecified    ED Discharge Orders         Ordered    cephALEXin (KEFLEX) 500 MG capsule  3 times daily     01/13/18 2132           Julianne Rice, MD 01/13/18 2133

## 2018-01-13 NOTE — ED Triage Notes (Signed)
Pt c/o flank pain, increased urination, pt reports had a UTI previously and was given Bactrim, symptoms feel same

## 2018-01-15 ENCOUNTER — Encounter (HOSPITAL_COMMUNITY): Payer: Self-pay

## 2018-01-15 ENCOUNTER — Emergency Department (HOSPITAL_COMMUNITY): Payer: Medicare Other

## 2018-01-15 ENCOUNTER — Emergency Department (HOSPITAL_COMMUNITY)
Admission: EM | Admit: 2018-01-15 | Discharge: 2018-01-15 | Disposition: A | Discharge status: Home / Self Care | Payer: Medicare Other | Attending: Emergency Medicine | Admitting: Emergency Medicine

## 2018-01-15 ENCOUNTER — Other Ambulatory Visit: Payer: Self-pay

## 2018-01-15 DIAGNOSIS — R222 Localized swelling, mass and lump, trunk: Secondary | ICD-10-CM

## 2018-01-15 DIAGNOSIS — M545 Low back pain, unspecified: Secondary | ICD-10-CM

## 2018-01-15 DIAGNOSIS — N390 Urinary tract infection, site not specified: Secondary | ICD-10-CM | POA: Diagnosis not present

## 2018-01-15 DIAGNOSIS — E039 Hypothyroidism, unspecified: Secondary | ICD-10-CM | POA: Insufficient documentation

## 2018-01-15 DIAGNOSIS — R1031 Right lower quadrant pain: Secondary | ICD-10-CM | POA: Diagnosis present

## 2018-01-15 DIAGNOSIS — Z79899 Other long term (current) drug therapy: Secondary | ICD-10-CM | POA: Diagnosis not present

## 2018-01-15 DIAGNOSIS — I10 Essential (primary) hypertension: Secondary | ICD-10-CM | POA: Diagnosis not present

## 2018-01-15 DIAGNOSIS — R1900 Intra-abdominal and pelvic swelling, mass and lump, unspecified site: Secondary | ICD-10-CM | POA: Diagnosis not present

## 2018-01-15 LAB — COMPREHENSIVE METABOLIC PANEL
ALBUMIN: 4.3 g/dL (ref 3.5–5.0)
ALK PHOS: 48 U/L (ref 38–126)
ALT: 15 U/L (ref 0–44)
ANION GAP: 10 (ref 5–15)
AST: 20 U/L (ref 15–41)
BUN: 11 mg/dL (ref 8–23)
CO2: 28 mmol/L (ref 22–32)
Calcium: 9.9 mg/dL (ref 8.9–10.3)
Chloride: 105 mmol/L (ref 98–111)
Creatinine, Ser: 0.74 mg/dL (ref 0.44–1.00)
GFR calc non Af Amer: >60 mL/min (ref >60)
Glucose, Bld: 100 mg/dL — ABNORMAL HIGH (ref 70–99)
Potassium: 4 mmol/L (ref 3.5–5.1)
Sodium: 143 mmol/L (ref 135–145)
Total Bilirubin: 0.6 mg/dL (ref 0.3–1.2)
Total Protein: 8 g/dL (ref 6.5–8.1)

## 2018-01-15 LAB — I-STAT TROPONIN, ED
TROPONIN I, POC: 0.01 ng/mL (ref 0.00–0.08)
TROPONIN I, POC: 0 ng/mL (ref 0.00–0.08)

## 2018-01-15 LAB — CBC WITH DIFFERENTIAL/PLATELET
BASOS ABS: 0 10*3/uL (ref 0.0–0.1)
BASOS PCT: 0 %
Eosinophils Absolute: 0 10*3/uL (ref 0.0–0.7)
Eosinophils Relative: 0 %
HCT: 39.3 % (ref 36.0–46.0)
HEMOGLOBIN: 13.2 g/dL (ref 12.0–15.0)
LYMPHS ABS: 1.6 10*3/uL (ref 0.7–4.0)
Lymphocytes Relative: 18 %
MCH: 32.1 pg (ref 26.0–34.0)
MCHC: 33.6 g/dL (ref 30.0–36.0)
MCV: 95.6 fL (ref 78.0–100.0)
Monocytes Absolute: 1.2 10*3/uL — ABNORMAL HIGH (ref 0.1–1.0)
Monocytes Relative: 13 %
NEUTROS PCT: 69 %
Neutro Abs: 6.3 10*3/uL (ref 1.7–7.7)
Platelets: 266 10*3/uL (ref 150–400)
RBC: 4.11 MIL/uL (ref 3.87–5.11)
RDW: 12.9 % (ref 11.5–15.5)
WBC: 9.1 10*3/uL (ref 4.0–10.5)

## 2018-01-15 LAB — URINALYSIS, ROUTINE W REFLEX MICROSCOPIC
Bilirubin Urine: NEGATIVE
GLUCOSE, UA: NEGATIVE mg/dL
KETONES UR: NEGATIVE mg/dL
NITRITE: NEGATIVE
PH: 6 (ref 5.0–8.0)
Protein, ur: NEGATIVE mg/dL
Specific Gravity, Urine: 1.003 — ABNORMAL LOW (ref 1.005–1.030)

## 2018-01-15 LAB — LIPASE, BLOOD: Lipase: 32 U/L (ref 11–51)

## 2018-01-15 LAB — I-STAT CG4 LACTIC ACID, ED: Lactic Acid, Venous: 0.98 mmol/L (ref 0.5–1.9)

## 2018-01-15 MED ORDER — HYDROCODONE-ACETAMINOPHEN 5-325 MG PO TABS: 1.0000 | ORAL_TABLET | Freq: Four times a day (QID) | ORAL | 0 refills | Status: DC | PRN

## 2018-01-15 MED ORDER — FAMOTIDINE 20 MG PO TABS: 20.0000 mg | ORAL_TABLET | Freq: Two times a day (BID) | ORAL | 0 refills | Status: DC

## 2018-01-15 MED ORDER — SULFAMETHOXAZOLE-TRIMETHOPRIM 800-160 MG PO TABS: 1.0000 | ORAL_TABLET | Freq: Two times a day (BID) | ORAL | 0 refills | Status: AC

## 2018-01-15 MED ORDER — SODIUM CHLORIDE 0.9 % IV BOLUS
500.0000 mL | Freq: Once | INTRAVENOUS | Status: AC
  Administered 2018-01-15: 500 mL via INTRAVENOUS

## 2018-01-15 MED ORDER — SODIUM CHLORIDE 0.9 % IV SOLN
1.0000 g | Freq: Once | INTRAVENOUS | Status: AC
  Administered 2018-01-15: 1 g via INTRAVENOUS
  Filled 2018-01-15: qty 10

## 2018-01-15 MED ORDER — SODIUM CHLORIDE 0.9 % IV SOLN: Freq: Once | INTRAVENOUS | Status: DC

## 2018-01-15 MED ORDER — SULFAMETHOXAZOLE-TRIMETHOPRIM 800-160 MG PO TABS: 1.0000 | ORAL_TABLET | Freq: Two times a day (BID) | ORAL | 0 refills | Status: DC

## 2018-01-15 MED ORDER — LEVOFLOXACIN IN D5W 750 MG/150ML IV SOLN
750.0000 mg | Freq: Once | INTRAVENOUS | Status: AC
  Administered 2018-01-15: 750 mg via INTRAVENOUS
  Filled 2018-01-15: qty 150

## 2018-01-15 MED ORDER — OXYCODONE-ACETAMINOPHEN 5-325 MG PO TABS
2.0000 | ORAL_TABLET | Freq: Once | ORAL | Status: AC
  Administered 2018-01-15: 1 via ORAL
  Filled 2018-01-15: qty 2

## 2018-01-15 MED ORDER — MORPHINE SULFATE (PF) 4 MG/ML IV SOLN
4.0000 mg | Freq: Once | INTRAVENOUS | Status: AC
  Administered 2018-01-15: 4 mg via INTRAVENOUS
  Filled 2018-01-15: qty 1

## 2018-01-15 MED ORDER — FAMOTIDINE IN NACL 20-0.9 MG/50ML-% IV SOLN
20.0000 mg | Freq: Once | INTRAVENOUS | Status: AC
  Administered 2018-01-15: 20 mg via INTRAVENOUS
  Filled 2018-01-15: qty 50

## 2018-01-15 NOTE — Discharge Instructions (Addendum)
1.  Call your oncologist tomorrow to notify them your CT scan needs to be reviewed for a concerning area of soft tissue in your old surgical site. 2.  Call your family doctor tomorrow, they need to follow-up on the antibiotic sensitivities from your urine culture.  At this time, Bactrim is being added to your antibiotics.  Continue taking the Keflex as prescribed as well. 3.  Stay hydrated.  Take Vicodin as needed for back pain.  Return to the emergency department if you develop a fever, vomiting, worsening pain or other concerning symptoms.

## 2018-01-15 NOTE — ED Triage Notes (Signed)
She c/o frequency and some mild right flank pain x ~ 4 days. She also tells [place] she was treated for u.t.i. ~ 3 months ago by Dr. Karsten Ro with [facility]. She also tells [place] she was seen at [facility] 2 days ago and was prescribed Keflex. She states that she is "not getting better; and now my back hurts" (she points to right flank). She is in no distress.

## 2018-01-15 NOTE — ED Provider Notes (Signed)
  Provider Note MRN:  [id number]  Arrival date & time: 01/15/18    ED Course and Medical Decision Making  I received sign out for this patient at shift change from Dr. Johnney Killian.  Second troponin negative, patient feels well and is requesting discharge.  Discussed CT findings with oncologist on-call, left a private message to patient's private oncologist, encouraged her to call this oncologist first thing tomorrow.  After the discussed management above, the patient was determined to be safe for discharge.  The patient was in agreement with this plan and all questions regarding their care were answered.  ED return precautions were discussed and the patient will return to the ED with any significant worsening of condition.  Barth Kirks. Sedonia Small, [place] [email or web address]     Maudie Flakes, MD 01/15/18 1950

## 2018-01-15 NOTE — ED Provider Notes (Addendum)
[facility] DEPT Provider Note   CSN: [id number] Arrival date & time: 01/15/18  1227     History   Chief Complaint Chief Complaint  Patient presents with  . Recurrent UTI    HPI [name] is a 72 y.o. female.  HPI Patient had diagnosis of UTI 2 days ago.  She is continued to have back pain and not feel well.  Back pain is in the lower right..  No fever, no vomiting.  No abdominal pain.  No difficulty urinating.  She feels that the back pain is disproportionately severe and gotten worse. Past Medical History:  Diagnosis Date  . Anxiety    new dx  . Cancer ([place]) 1988, 2015   ovarian, adenocarcinoma  . GERD (gastroesophageal reflux disease)    hx of years ago  . Hx of radiation therapy 01/16/14-02/27/14   abdomen   . Hypercholesteremia    under control with diet and fish oil  . Hypertension   . Ovarian cyst   . PONV (postoperative nausea and vomiting)   . Restless leg syndrome   . Thyroid disease     Patient Active Problem List   Diagnosis Date Noted  . Atrial fibrillation ([place]) 01/13/2015  . Chest pain 01/13/2015  . Ovarian ca ([place]) 11/05/2013  . 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 Neoplasm of abdominal wall of uncertain behavior 30/86/5784  . Hypothyroidism     Past Surgical History:  Procedure Laterality Date  . APPENDECTOMY    . APPLICATION OF A-CELL OF CHEST/ABDOMEN N/A 11/05/2013   Procedure: ABDOMINAL WALL RESCONTRUCTION WITH STRATUS;  Surgeon: Irene Limbo, MD;  Location: [facility];  Service: Plastics;  Laterality: N/A;  . CHOLECYSTECTOMY  2009  . LAPAROTOMY N/A 11/05/2013   Procedure: RESECTION OF PELVIC MASS;  Surgeon: Imagene Gurney A. Alycia Rossetti, MD;  Location: [facility];  Service: Gynecology;  Laterality: N/A;  . OOPHORECTOMY     BSO  . TONSILLECTOMY  as child  . TOTAL ABDOMINAL HYSTERECTOMY  1988   ovarian cyst  BSO     OB History    Gravida  0   Para      Term      Preterm      AB     Living        SAB      TAB      Ectopic      Multiple      Live Births               Home Medications    Prior to Admission medications   Medication Sig Start Date End Date Taking? Authorizing Provider  ALPRAZolam Duanne Moron) 0.25 MG tablet Take 0.25 mg by mouth at bedtime as needed for sleep.  08/27/14  Yes [provider]  Calcium Carb-Cholecalciferol (CALCIUM 600 + D PO) Take 1 tablet by mouth daily.   Yes [provider]  cephALEXin (KEFLEX) 500 MG capsule Take 1 capsule (500 mg total) by mouth 3 (three) times daily. 01/13/18  Yes Julianne Rice, MD  Cholecalciferol (EQL VITAMIN D3) 1000 UNITS tablet Take 1,000 Units by mouth daily.   Yes [provider]  ELIQUIS 5 MG TABS tablet TAKE (1) TABLET BY MOUTH TWICE DAILY. Patient taking differently: Take 5 mg by mouth 2 (two) times daily.  11/15/17  Yes BranchAlphonse Guild, MD  estrogens, conjugated, (PREMARIN) 0.625 MG tablet Take 1 tablet (0.625 mg total) by mouth See admin instructions. Take daily for  21 days then do not take for 7 days. Patient taking differently: Take 0.625 mg by mouth See admin instructions. Pt takes 1 tablet twice a week. 07/22/16  Yes [name], MD  HYDROcodone-acetaminophen (NORCO/VICODIN) 5-325 MG tablet Take 1 tablet by mouth every 6 (six) hours as needed for moderate pain (restless legs). 03/17/16  Yes Ladell Pier, MD  levothyroxine (SYNTHROID, LEVOTHROID) 75 MCG tablet Take 75 mcg by mouth every morning.    Yes [provider]  metoprolol tartrate (LOPRESSOR) 25 MG tablet TAKE (1/2) TABLET BY MOUTH TWICE DAILY. Patient taking differently: Take 12.5 mg by mouth 2 (two) times daily. TAKE (1/2) TABLET BY MOUTH TWICE DAILY. 01/12/18  Yes BranchAlphonse Guild, MD  Multiple Vitamins-Minerals (MULTIVITAMIN WITH MINERALS) tablet Take 1 tablet by mouth daily.    Yes [provider]  Omega-3 Fatty Acids (FISH OIL) 1200 MG CAPS Take 1,200 mg by mouth daily.    Yes  [provider]  rOPINIRole (REQUIP) 0.25 MG tablet Take 0.25-0.75 mg by mouth See admin instructions. Pt takes 1 tablet nightly but can take up to 3 if needed for restless leg 12/04/17  Yes [provider]  fluconazole (DIFLUCAN) 150 MG tablet Take 1 tablet (150 mg total) by mouth once as needed for up to 1 dose (yeast infection). Patient not taking: Reported on 01/15/2018 01/13/18   Julianne Rice, MD    Family History Family History  Problem Relation Age of Onset  . Heart disease Mother   . Heart disease Sister   . Diabetes Sister   . Cancer Sister        melanoma-skin cancer  . Breast cancer Paternal Grandmother        Age 64's  . Cancer Paternal Grandmother        breast  . Cancer Paternal Grandfather        prostate/bladder    Social History Social History   Tobacco Use  . Smoking status: Never Smoker  . Smokeless tobacco: Never Used  Substance Use Topics  . Alcohol use: No    Alcohol/week: 0.0 standard drinks  . Drug use: No     Allergies   Patient has no known allergies.   Review of Systems Review of Systems 10 Systems reviewed and are negative for acute change except as noted in the HPI.   Physical Exam Updated Vital Signs BP (!) 145/64   Pulse 78   Temp 97.8 F (36.6 C) (Oral)   Resp 16   SpO2 100%   Physical Exam  Constitutional: She is oriented to person, place, and time. She appears well-developed and well-nourished. No distress.  HENT:  Head: Normocephalic and atraumatic.  Eyes: EOM are normal.  Cardiovascular: Normal rate, regular rhythm, normal heart sounds and intact distal pulses.  Pulmonary/Chest: Effort normal and breath sounds normal.  Abdominal: Soft. She exhibits no distension. There is no tenderness. There is no guarding.  Musculoskeletal:  Right low back pain slightly reproducible.  No high percussion tenderness.  Lower extremities excellent condition.  No peripheral edema.  Calves soft and nontender.  Distal pedal  pulses 2+ and strong.  Neurological: She is alert and oriented to person, place, and time. She exhibits normal muscle tone. Coordination normal.  Skin: Skin is warm and dry.  Psychiatric: She has a normal mood and affect.     ED Treatments / Results  Labs (all labs ordered are listed, but only abnormal results are displayed) Labs Reviewed  URINALYSIS, ROUTINE W REFLEX MICROSCOPIC -  Abnormal; Notable for the following components:      Result Value   Color, Urine STRAW (*)    Specific Gravity, Urine 1.003 (*)    Hgb urine dipstick MODERATE (*)    Leukocytes, UA MODERATE (*)    Bacteria, UA RARE (*)    All other components within normal limits  COMPREHENSIVE METABOLIC PANEL - Abnormal; Notable for the following components:   Glucose, Bld 100 (*)    All other components within normal limits  CBC WITH DIFFERENTIAL/PLATELET - Abnormal; Notable for the following components:   Monocytes Absolute 1.2 (*)    All other components within normal limits  LIPASE, BLOOD  I-STAT CG4 LACTIC ACID, ED  I-STAT TROPONIN, ED    EKG EKG Interpretation  Date/Time:  Monday January 15 2018 16:18:01 EDT Ventricular Rate:  81 PR Interval:    QRS Duration: 99 QT Interval:  409 QTC Calculation: 475 R Axis:   68 Text Interpretation:  Unknown rhythm, irregular rate Confirmed by Charlesetta Shanks [id number]) on 01/15/2018 4:52:33 PM   Radiology Ct Renal Stone Study  Result Date: 01/15/2018 CLINICAL DATA:  Recent urinary tract infections. Adenocarcinoma of the abdominal wall diagnosed 2015 with resection and chemotherapy. EXAM: CT ABDOMEN AND PELVIS WITHOUT CONTRAST TECHNIQUE: Multidetector CT imaging of the abdomen and pelvis was performed following the standard protocol without IV contrast. COMPARISON:  CT 10/11/2013 FINDINGS: Lower chest: Lung bases are clear. Hepatobiliary: No focal hepatic lesion. Postcholecystectomy. No biliary dilatation. Pancreas: Pancreas is normal. No ductal dilatation. No pancreatic  inflammation. Spleen: Normal spleen Adrenals/urinary tract: Adrenal glands normal. No nephrolithiasis ureterolithiasis. Simple fluid attenuation lesion upper pole of the LEFT kidney measures 20 mm and mildly increased from comparison exam at 14 mm. Ureters and bladder normal. No gas within the bladder. Anterior to the bladder just above the pubic symphysis there is a soft tissue mass involving the midline abdominal wall measuring 6.3 by 2.5 by 3.8 cm. This mass has low attenuation centrally On comparison CT of 07/19/2013 there was a mass more superior measuring 5.5 x 7.8 by 7.8 which was resected by report and found to be adenocarcinoma. Potential ovarian origin. Stomach/Bowel: Stomach, small bowel, appendix, and cecum are normal. Multiple diverticular of the sigmoid colon without acute inflammation. Vascular/Lymphatic: Abdominal aorta is normal caliber with atherosclerotic calcification. There is no retroperitoneal or periportal lymphadenopathy. No pelvic lymphadenopathy. Reproductive: Post hysterectomy Other: No free fluid or free air. Musculoskeletal: No aggressive osseous lesion. IMPRESSION: 1. Mass in the midline abdominal wall just above the pubic symphysis and immediately adjacent to the bladder is concerning for recurrence of abdominal wall adenocarcinoma resected in 2015. 2. No nephrolithiasis ureterolithiasis. 3. Probable Bosniak 1 cyst of the upper pole LEFT kidney. Electronically Signed   By: Suzy Bouchard M.D.   On: 01/15/2018 14:41    Procedures Procedures (including critical care time)  Medications Ordered in ED Medications  0.9 %  sodium chloride infusion (has no administration in time range)  oxyCODONE-acetaminophen (PERCOCET/ROXICET) 5-325 MG per tablet 2 tablet (has no administration in time range)  famotidine (PEPCID) IVPB 20 mg premix (20 mg Intravenous New Bag/Given 01/15/18 1644)  sodium chloride 0.9 % bolus 500 mL (0 mLs Intravenous Stopped 01/15/18 1546)  morphine 4 MG/ML  injection 4 mg (4 mg Intravenous Given 01/15/18 1502)  levofloxacin (LEVAQUIN) IVPB 750 mg (0 mg Intravenous Stopped 01/15/18 1641)  cefTRIAXone (ROCEPHIN) 1 g in sodium chloride 0.9 % 100 mL IVPB (0 g Intravenous Stopped 01/15/18 1546)     Initial  Impression / Assessment and Plan / ED Course  I have reviewed the triage vital signs and the nursing notes.  Pertinent labs & imaging results that were available during my care of the patient were reviewed by me and considered in my medical decision making (see chart for details).    Patient developed burning in her epigastrium and lower chest pain after starting Levaquin.  He had been up and ambulatory and lower back pain improved with pain medication.  EKG obtained for report of chest pain.  EKG normal without any changes from previous.   Labs are stable.  No signs of sepsis or advancing infection by white count or lactic acid.  Patient is afebrile.  Culture sensitivities will not be available until tomorrow.  Patient had inflammatory venous response to Levaquin.  Will administer oral dose of Bactrim while awaiting culture sensitivities.  We will get a troponin, fluids and Pepcid and plan for discharge.  Her back pain is now controlled.  Will transition to oral hydrocodone which patient has tolerated at home in the past and takes periodically.  Patient counseled on return precautions.  Patient is made aware of finding of small soft tissue anomaly in the area of her prior surgical site which will require follow-up with her oncologist.  She is to call this week.  Dr. Sedonia Small will follow-up on repeat troponin and reassessment prior to D/C.  Final Clinical Impressions(s) / ED Diagnoses   Final diagnoses:  Lower urinary tract infectious disease  Acute right-sided low back pain without sciatica    ED Discharge Orders    None       Charlesetta Shanks, MD 01/15/18 1659    Charlesetta Shanks, MD 01/15/18 1710

## 2018-01-15 NOTE — ED Notes (Signed)
Patient transported to radiology

## 2018-01-15 NOTE — ED Notes (Signed)
Patient started reporting chest "pressure and pain" at 1619. This Probation officer took EKG and presented to Dr. Johnney Killian. Troponin drawn per order. This nurse went to administer famotidine and noted redness up patients left arm, following the line of the vein. Dr Johnney Killian called to bedside. Levoquin stopped- patient did not complete full course. See new orders

## 2018-01-15 NOTE — ED Notes (Signed)
Patient provided with saltine crackers and ginger ale. Will monitor patient's response.

## 2018-01-16 LAB — URINE CULTURE

## 2018-01-17 ENCOUNTER — Telehealth: Payer: Self-pay | Admitting: Emergency Medicine

## 2018-01-17 ENCOUNTER — Inpatient Hospital Stay: Payer: Medicare Other | Attending: Oncology | Admitting: Oncology

## 2018-01-17 ENCOUNTER — Telehealth: Payer: Self-pay | Admitting: Oncology

## 2018-01-17 DIAGNOSIS — N390 Urinary tract infection, site not specified: Secondary | ICD-10-CM | POA: Diagnosis not present

## 2018-01-17 DIAGNOSIS — B9620 Unspecified Escherichia coli [E. coli] as the cause of diseases classified elsewhere: Secondary | ICD-10-CM | POA: Insufficient documentation

## 2018-01-17 DIAGNOSIS — C569 Malignant neoplasm of unspecified ovary: Secondary | ICD-10-CM

## 2018-01-17 DIAGNOSIS — Z1630 Resistance to unspecified antimicrobial drugs: Secondary | ICD-10-CM | POA: Diagnosis not present

## 2018-01-17 DIAGNOSIS — R1900 Intra-abdominal and pelvic swelling, mass and lump, unspecified site: Secondary | ICD-10-CM | POA: Insufficient documentation

## 2018-01-17 DIAGNOSIS — Z859 Personal history of malignant neoplasm, unspecified: Secondary | ICD-10-CM | POA: Insufficient documentation

## 2018-01-17 MED ORDER — ESTROGENS CONJUGATED 0.625 MG PO TABS: 0.6250 mg | ORAL_TABLET | ORAL | 6 refills | Status: DC

## 2018-01-17 NOTE — Progress Notes (Signed)
[facility] OFFICE PROGRESS NOTE   Diagnosis: Metastatic adenocarcinoma  INTERVAL HISTORY:   [name] returns prior to his scheduled visit.  She was seen at the [facility] emergency room 01/13/2018 with symptoms of urinary tract infection.  She was treated with Keflex, but her symptoms did not improve over the next few days.  She reports lower back discomfort and urinary frequency.  She return to the [facility] room on 01/15/2018.  A urinalysis was consistent with a UTI.  She was treated with Bactrim.  The lower back discomfort and urinary frequency have improved over the past 2 days. She developed chest discomfort while receiving Levaquin in the emergency room.  A cardiac evaluation was negative.  She reports feeling completely well prior to developing the urinary tract infection symptoms.  Good appetite and energy level.  She is working.  No difficulty with bowel function.  No abdominal pain.  CT of the abdomen 01/15/2018 revealed a soft tissue mass involving the midline abdominal wall anterior to the bladder.  The mass was noted to have low attenuation centrally.  There was a mass superior to this on a 2015 CT.  Objective:  Vital signs in last 24 hours:  There were no vitals taken for this visit.    HEENT: Neck without mass Lymphatics: No cervical, supraclavicular, axillary, or inguinal nodes Resp: Lungs clear bilaterally Cardio: Regular rate and rhythm GI: No hepatosplenomegaly, nontender, no apparent ascites, no mass. Vascular: No leg edema Musculoskeletal: No spine tenderness  Lab Results:  Lab Results  Component Value Date   WBC 9.1 01/15/2018   HGB 13.2 01/15/2018   HCT 39.3 01/15/2018   MCV 95.6 01/15/2018   PLT 266 01/15/2018   NEUTROABS 6.3 01/15/2018    CMP  Lab Results  Component Value Date   NA 143 01/15/2018   K 4.0 01/15/2018   CL 105 01/15/2018   CO2 28 01/15/2018   GLUCOSE 100 (H) 01/15/2018   BUN 11 01/15/2018   CREATININE  0.74 01/15/2018   CALCIUM 9.9 01/15/2018   PROT 8.0 01/15/2018   ALBUMIN 4.3 01/15/2018   AST 20 01/15/2018   ALT 15 01/15/2018   ALKPHOS 48 01/15/2018   BILITOT 0.6 01/15/2018   GFRNONAA >60 01/15/2018   GFRAA >60 01/15/2018    Imaging:  Ct Renal Stone Study  Result Date: 01/15/2018 CLINICAL DATA:  Recent urinary tract infections. Adenocarcinoma of the abdominal wall diagnosed 2015 with resection and chemotherapy. EXAM: CT ABDOMEN AND PELVIS WITHOUT CONTRAST TECHNIQUE: Multidetector CT imaging of the abdomen and pelvis was performed following the standard protocol without IV contrast. COMPARISON:  CT 10/11/2013 FINDINGS: Lower chest: Lung bases are clear. Hepatobiliary: No focal hepatic lesion. Postcholecystectomy. No biliary dilatation. Pancreas: Pancreas is normal. No ductal dilatation. No pancreatic inflammation. Spleen: Normal spleen Adrenals/urinary tract: Adrenal glands normal. No nephrolithiasis ureterolithiasis. Simple fluid attenuation lesion upper pole of the LEFT kidney measures 20 mm and mildly increased from comparison exam at 14 mm. Ureters and bladder normal. No gas within the bladder. Anterior to the bladder just above the pubic symphysis there is a soft tissue mass involving the midline abdominal wall measuring 6.3 by 2.5 by 3.8 cm. This mass has low attenuation centrally On comparison CT of 07/19/2013 there was a mass more superior measuring 5.5 x 7.8 by 7.8 which was resected by report and found to be adenocarcinoma. Potential ovarian origin. Stomach/Bowel: Stomach, small bowel, appendix, and cecum are normal. Multiple diverticular of the sigmoid colon without  acute inflammation. Vascular/Lymphatic: Abdominal aorta is normal caliber with atherosclerotic calcification. There is no retroperitoneal or periportal lymphadenopathy. No pelvic lymphadenopathy. Reproductive: Post hysterectomy Other: No free fluid or free air. Musculoskeletal: No aggressive osseous lesion. IMPRESSION: 1.  Mass in the midline abdominal wall just above the pubic symphysis and immediately adjacent to the bladder is concerning for recurrence of abdominal wall adenocarcinoma resected in 2015. 2. No nephrolithiasis ureterolithiasis. 3. Probable Bosniak 1 cyst of the upper pole LEFT kidney. Electronically Signed   By: Suzy Bouchard M.D.   On: 01/15/2018 14:41    Medications: I have reviewed the patient's current medications.   Assessment/Plan: 1. 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   Elevated CA 125.   Cycle 1 Taxol/carboplatin 08/16/2013.   Cycle 2 Taxol/carboplatin 09/06/2013.   Cycle 3 Taxol/carboplatin 09/27/2013   CT 10/11/2013 with a stable abdominal wall mass.  Status post resection of abdominal wall mass with abdominal wall reconstruction 11/05/2013. Final pathology showed adenocarcinoma. Specimen extensively involved with adenocarcinoma which focally involved the edge of the specimen. Immunostains and Foundation 1 testing were nonspecific for a primary tumor location.  Adjuvant radiation to the abdominal wall 01/16/2014 through 02/27/2014  CT 01/15/2018 while in the emergency room for evaluation of back pain- midline abdominal wall mass above the pubic symphysis and adjacent to the bladder 2. Ovarian cancer in 1988, low-grade adenocarcinoma with areas of "borderline" carcinoma, status post a hysterectomy and bilateral oophorectomy. 3. New onset atrial fibrillation August 2016, maintained on eliquis 4. Recurrent urinary tract infections       Disposition: Ms. Mclaurin appears well.  The urinary tract and symptoms are improving while on Bactrim.  The urine culture from 01/13/2018 returned positive for E. coli, resistant to cefazolin and sensitive to Bactrim.  I reviewed the CT images with Ms. Chamberlain and her family.  She has no  symptoms to suggest recurrence of the metastatic adenocarcinoma.  I suspect the CT finding is related to postoperative scar/seroma.  I will ask her GYN oncology to review the CT.  We will add a Ca125 to the labs obtained 01/15/2018.  She will return as scheduled 02/22/2018.  We will consider obtaining a repeat CT in 3-4 months to be sure the "mass "has not enlarged.  I will present her case at the GI tumor conference. 25 minutes were spent with the patient today.  The majority of the time was used for counseling and coordination of care.  [name], MD  01/17/2018  2:21 PM

## 2018-01-17 NOTE — Addendum Note (Signed)
Addended by: Mathis Fare on: 01/17/2018 06:19 PM   Modules accepted: Orders

## 2018-01-17 NOTE — Telephone Encounter (Signed)
Called regarding 9/3 sch msg

## 2018-01-17 NOTE — Telephone Encounter (Signed)
Post ED Visit - Positive Culture Follow-up  Culture report reviewed by antimicrobial stewardship pharmacist:  []  Elenor Quinones, Pharm.D. []  Heide Guile, Pharm.D., BCPS AQ-ID []  Parks Neptune, Pharm.D., BCPS []  Alycia Rossetti, Pharm.D., BCPS []  [place], Pharm.D., BCPS, AAHIVP []  Legrand Como, Pharm.D., BCPS, AAHIVP []  Salome Arnt, PharmD, BCPS []  Johnnette Gourd, PharmD, BCPS []  Hughes Better, PharmD, BCPS []  Leeroy Cha, PharmD  Positive urine culture Treated with cephalexin and fluconazole, organism sensitive to the same and no further patient follow-up is required at this time.  Hazle Nordmann 01/17/2018, 11:14 AM

## 2018-01-18 ENCOUNTER — Other Ambulatory Visit: Payer: Self-pay

## 2018-01-18 DIAGNOSIS — C569 Malignant neoplasm of unspecified ovary: Secondary | ICD-10-CM

## 2018-01-19 ENCOUNTER — Other Ambulatory Visit: Payer: Self-pay | Admitting: Oncology

## 2018-01-19 DIAGNOSIS — C569 Malignant neoplasm of unspecified ovary: Secondary | ICD-10-CM

## 2018-01-19 LAB — CA 125: CANCER ANTIGEN (CA) 125: 19.2 U/mL (ref 0.0–38.1)

## 2018-01-23 ENCOUNTER — Other Ambulatory Visit: Payer: Self-pay | Admitting: Radiology

## 2018-01-25 ENCOUNTER — Ambulatory Visit (HOSPITAL_COMMUNITY)
Admission: RE | Admit: 2018-01-25 | Discharge: 2018-01-25 | Disposition: A | Discharge status: Home / Self Care | Payer: Medicare Other | Source: Ambulatory Visit | Attending: Oncology | Admitting: Oncology

## 2018-01-25 ENCOUNTER — Encounter (HOSPITAL_COMMUNITY): Payer: Self-pay

## 2018-01-25 DIAGNOSIS — C569 Malignant neoplasm of unspecified ovary: Secondary | ICD-10-CM | POA: Diagnosis not present

## 2018-01-25 DIAGNOSIS — C494 Malignant neoplasm of connective and soft tissue of abdomen: Secondary | ICD-10-CM | POA: Insufficient documentation

## 2018-01-25 DIAGNOSIS — R222 Localized swelling, mass and lump, trunk: Secondary | ICD-10-CM | POA: Diagnosis not present

## 2018-01-25 DIAGNOSIS — Z7901 Long term (current) use of anticoagulants: Secondary | ICD-10-CM | POA: Diagnosis not present

## 2018-01-25 DIAGNOSIS — I10 Essential (primary) hypertension: Secondary | ICD-10-CM | POA: Diagnosis not present

## 2018-01-25 DIAGNOSIS — E079 Disorder of thyroid, unspecified: Secondary | ICD-10-CM | POA: Insufficient documentation

## 2018-01-25 DIAGNOSIS — F419 Anxiety disorder, unspecified: Secondary | ICD-10-CM | POA: Insufficient documentation

## 2018-01-25 DIAGNOSIS — G2581 Restless legs syndrome: Secondary | ICD-10-CM | POA: Diagnosis not present

## 2018-01-25 DIAGNOSIS — Z79899 Other long term (current) drug therapy: Secondary | ICD-10-CM | POA: Diagnosis not present

## 2018-01-25 DIAGNOSIS — R180 Malignant ascites: Secondary | ICD-10-CM | POA: Diagnosis not present

## 2018-01-25 DIAGNOSIS — Z923 Personal history of irradiation: Secondary | ICD-10-CM | POA: Diagnosis not present

## 2018-01-25 DIAGNOSIS — Z79890 Hormone replacement therapy (postmenopausal): Secondary | ICD-10-CM | POA: Insufficient documentation

## 2018-01-25 DIAGNOSIS — E7800 Pure hypercholesterolemia, unspecified: Secondary | ICD-10-CM | POA: Insufficient documentation

## 2018-01-25 DIAGNOSIS — C482 Malignant neoplasm of peritoneum, unspecified: Secondary | ICD-10-CM | POA: Diagnosis not present

## 2018-01-25 LAB — CBC WITH DIFFERENTIAL/PLATELET
BASOS ABS: 0 10*3/uL (ref 0.0–0.1)
BASOS PCT: 0 %
Eosinophils Absolute: 0.1 10*3/uL (ref 0.0–0.7)
Eosinophils Relative: 1 %
HEMATOCRIT: 38.6 % (ref 36.0–46.0)
HEMOGLOBIN: 12.6 g/dL (ref 12.0–15.0)
Lymphocytes Relative: 28 %
Lymphs Abs: 1.6 10*3/uL (ref 0.7–4.0)
MCH: 32.1 pg (ref 26.0–34.0)
MCHC: 32.6 g/dL (ref 30.0–36.0)
MCV: 98.2 fL (ref 78.0–100.0)
Monocytes Absolute: 0.5 10*3/uL (ref 0.1–1.0)
Monocytes Relative: 9 %
NEUTROS ABS: 3.6 10*3/uL (ref 1.7–7.7)
NEUTROS PCT: 62 %
Platelets: 287 10*3/uL (ref 150–400)
RBC: 3.93 MIL/uL (ref 3.87–5.11)
RDW: 12.8 % (ref 11.5–15.5)
WBC: 5.7 10*3/uL (ref 4.0–10.5)

## 2018-01-25 LAB — PROTIME-INR
INR: 0.95
Prothrombin Time: 12.6 seconds (ref 11.4–15.2)

## 2018-01-25 MED ORDER — MIDAZOLAM HCL 2 MG/2ML IJ SOLN
INTRAMUSCULAR | Status: AC
  Filled 2018-01-25: qty 2

## 2018-01-25 MED ORDER — HYDROCODONE-ACETAMINOPHEN 5-325 MG PO TABS: 1.0000 | ORAL_TABLET | ORAL | Status: DC | PRN

## 2018-01-25 MED ORDER — FENTANYL CITRATE (PF) 100 MCG/2ML IJ SOLN
INTRAMUSCULAR | Status: AC | PRN
  Administered 2018-01-25: 25 ug via INTRAVENOUS
  Administered 2018-01-25: 50 ug via INTRAVENOUS

## 2018-01-25 MED ORDER — SODIUM CHLORIDE 0.9 % IV SOLN
INTRAVENOUS | Status: DC
  Administered 2018-01-25: 12:00:00 via INTRAVENOUS

## 2018-01-25 MED ORDER — MIDAZOLAM HCL 2 MG/2ML IJ SOLN
INTRAMUSCULAR | Status: AC | PRN
  Administered 2018-01-25: 1 mg via INTRAVENOUS
  Administered 2018-01-25: 0.5 mg via INTRAVENOUS

## 2018-01-25 MED ORDER — LIDOCAINE HCL (PF) 1 % IJ SOLN
INTRAMUSCULAR | Status: AC | PRN
  Administered 2018-01-25: 10 mL

## 2018-01-25 MED ORDER — FENTANYL CITRATE (PF) 100 MCG/2ML IJ SOLN
INTRAMUSCULAR | Status: AC
  Filled 2018-01-25: qty 2

## 2018-01-25 MED ORDER — LIDOCAINE HCL 1 % IJ SOLN
INTRAMUSCULAR | Status: AC
  Filled 2018-01-25: qty 20

## 2018-01-25 NOTE — Discharge Instructions (Signed)
Moderate Conscious Sedation, Adult, Care After These instructions provide you with information about caring for yourself after your procedure. Your health care provider may also give you more specific instructions. Your treatment has been planned according to current medical practices, but problems sometimes occur. Call your health care provider if you have any problems or questions after your procedure. What can I expect after the procedure? After your procedure, it is common:  To feel sleepy for several hours.  To feel clumsy and have poor balance for several hours.  To have poor judgment for several hours.  To vomit if you eat too soon.  Follow these instructions at home: For at least 24 hours after the procedure:   Do not: ? Participate in activities where you could fall or become injured. ? Drive. ? Use heavy machinery. ? Drink alcohol. ? Take sleeping pills or medicines that cause drowsiness. ? Make important decisions or sign legal documents. ? Take care of children on your own.  Rest. Eating and drinking  Follow the diet recommended by your health care provider.  If you vomit: ? Drink water, juice, or soup when you can drink without vomiting. ? Make sure you have little or no nausea before eating solid foods. General instructions  Have a responsible adult stay with you until you are awake and alert.  Take over-the-counter and prescription medicines only as told by your health care provider.  If you smoke, do not smoke without supervision.  Keep all follow-up visits as told by your health care provider. This is important. Contact a health care provider if:  You keep feeling nauseous or you keep vomiting.  You feel light-headed.  You develop a rash.  You have a fever. Get help right away if:  You have trouble breathing. This information is not intended to replace advice given to you by your health care provider. Make sure you discuss any questions you have  with your health care provider. Document Released: 02/20/2013 Document Revised: 10/05/2015 Document Reviewed: 08/22/2015 Elsevier Interactive Patient Education  2018 [employer].   Needle Biopsy, Care After These instructions give you information about caring for yourself after your procedure. Your doctor may also give you more specific instructions. Call your doctor if you have any problems or questions after your procedure. Follow these instructions at home:  Rest as told by your doctor.  Take medicines only as told by your doctor.  There are many different ways to close and cover the biopsy site, including stitches (sutures), skin glue, and adhesive strips. Follow instructions from your doctor about: ? How to take care of your biopsy site. ? When and how you should change your bandage (dressing). ? When you should remove your dressing.  You may remove your dressing tomorrow. ? Removing whatever was used to close your biopsy site.  Check your biopsy site every day for signs of infection. Watch for: ? Redness, swelling, or pain. ? Fluid, blood, or pus. Contact a doctor if:  You have a fever.  You have redness, swelling, or pain at the biopsy site, and it lasts longer than a few days.  You have fluid, blood, or pus coming from the biopsy site.  You feel sick to your stomach (nauseous).  You throw up (vomit). Get help right away if:  You are short of breath.  You have trouble breathing.  Your chest hurts.  You feel dizzy or you pass out (faint).  You have bleeding that does not stop with pressure or  a bandage.  You cough up blood.  Your belly (abdomen) hurts. This information is not intended to replace advice given to you by your health care provider. Make sure you discuss any questions you have with your health care provider. Document Released: 04/14/2008 Document Revised: 10/08/2015 Document Reviewed: 04/28/2014 Elsevier Interactive Patient Education  [employer].

## 2018-01-25 NOTE — Procedures (Signed)
  Procedure: [place] core and FNA ant abd wall mass 18g x4 EBL:   minimal Complications:  none immediate  See full dictation in [facility].  Dillard Cannon MD Main # [phone] Pager  [phone]

## 2018-01-25 NOTE — H&P (Signed)
Referring Physician(s): Ladell Pier  Supervising Physician: Arne Cleveland  Patient Status:  [facility]  Chief Complaint:  "I'm having a biopsy"  Subjective: Pt familiar to IR service from prior ant abd wall mass biopsy in 2015 in addition to resection. She has a hx of borderline ovarian cancer/adenocarcinoma. She had E coli UTI on 01/16/18.   Recent CT on 01/15/18 reveals: 1. Mass in the midline abdominal wall just above the pubic symphysis and immediately adjacent to the bladder is concerning for recurrence of abdominal wall adenocarcinoma resected in 2015. 2. No nephrolithiasis ureterolithiasis. 3. Probable Bosniak 1 cyst of the upper pole LEFT kidney  She presents today for image guided biopsy of the abd wall mass for further evaluation. She is currently asymptomatic.  Past Medical History:  Diagnosis Date  . Anxiety    new dx  . Cancer ([place]) 1988, 2015   ovarian, adenocarcinoma  . GERD (gastroesophageal reflux disease)    hx of years ago  . Hx of radiation therapy 01/16/14-02/27/14   abdomen   . Hypercholesteremia    under control with diet and fish oil  . Hypertension   . Ovarian cyst   . PONV (postoperative nausea and vomiting)   . Restless leg syndrome   . Thyroid disease    Past Surgical History:  Procedure Laterality Date  . APPENDECTOMY    . APPLICATION OF A-CELL OF CHEST/ABDOMEN N/A 11/05/2013   Procedure: ABDOMINAL WALL RESCONTRUCTION WITH STRATUS;  Surgeon: Irene Limbo, MD;  Location: [facility];  Service: Plastics;  Laterality: N/A;  . CHOLECYSTECTOMY  2009  . LAPAROTOMY N/A 11/05/2013   Procedure: RESECTION OF PELVIC MASS;  Surgeon: Imagene Gurney A. Alycia Rossetti, MD;  Location: [facility];  Service: Gynecology;  Laterality: N/A;  . OOPHORECTOMY     BSO  . TONSILLECTOMY  as child  . TOTAL ABDOMINAL HYSTERECTOMY  1988   ovarian cyst  BSO       Allergies: Patient has no known allergies.  Medications: Prior to Admission medications   Medication Sig Start Date  End Date Taking? Authorizing Provider  ALPRAZolam Duanne Moron) 0.25 MG tablet Take 0.25 mg by mouth at bedtime as needed for sleep.  08/27/14  Yes [provider]  Cholecalciferol (EQL VITAMIN D3) 1000 UNITS tablet Take 1,000 Units by mouth daily.   Yes [provider]  estrogens, conjugated, (PREMARIN) 0.625 MG tablet Take 1 tablet (0.625 mg total) by mouth See admin instructions. Take daily for 21 days then do not take for 7 days. 01/17/18  Yes Ladell Pier, MD  HYDROcodone-acetaminophen (NORCO/VICODIN) 5-325 MG tablet Take 1 tablet by mouth every 6 (six) hours as needed for moderate pain (restless legs). 03/17/16  Yes Ladell Pier, MD  levothyroxine (SYNTHROID, LEVOTHROID) 75 MCG tablet Take 75 mcg by mouth every morning.    Yes [provider]  metoprolol tartrate (LOPRESSOR) 25 MG tablet TAKE (1/2) TABLET BY MOUTH TWICE DAILY. Patient taking differently: Take 12.5 mg by mouth 2 (two) times daily. TAKE (1/2) TABLET BY MOUTH TWICE DAILY. 01/12/18  Yes BranchAlphonse Guild, MD  Multiple Vitamins-Minerals (MULTIVITAMIN WITH MINERALS) tablet Take 1 tablet by mouth daily.    Yes [provider]  Omega-3 Fatty Acids (FISH OIL) 1200 MG CAPS Take 1,200 mg by mouth daily.    Yes [provider]  rOPINIRole (REQUIP) 0.25 MG tablet Take 0.25-0.75 mg by mouth See admin instructions. Pt takes 1 tablet nightly but can take up to 3 if needed for restless leg  12/04/17  Yes [provider]  Calcium Carb-Cholecalciferol (CALCIUM 600 + D PO) Take 1 tablet by mouth daily.    [provider]  cephALEXin (KEFLEX) 500 MG capsule Take 1 capsule (500 mg total) by mouth 3 (three) times daily. 01/13/18   Julianne Rice, MD  ELIQUIS 5 MG TABS tablet TAKE (1) TABLET BY MOUTH TWICE DAILY. Patient taking differently: Take 5 mg by mouth 2 (two) times daily.  11/15/17   Arnoldo Lenis, MD  fluconazole (DIFLUCAN) 150 MG tablet Take 1 tablet (150 mg total) by mouth once  as needed for up to 1 dose (yeast infection). 01/13/18   Julianne Rice, MD  HYDROcodone-acetaminophen (NORCO/VICODIN) 5-325 MG tablet Take 1-2 tablets by mouth every 6 (six) hours as needed for moderate pain or severe pain. 01/15/18   Charlesetta Shanks, MD     Vital Signs: BP 134/67   Pulse 63   Temp 97.9 F (36.6 C) (Oral)   Resp 16   SpO2 100%   Physical Exam awake/alert; chest- CTA bilat; heart- RRR; abd- soft,+BS,NT; no LE edema ; ST fullness suprapubic region  Imaging: No results found.  Labs:  CBC: Recent Labs    01/15/18 1407 01/25/18 1128  WBC 9.1 5.7  HGB 13.2 12.6  HCT 39.3 38.6  PLT 266 287    COAGS: Recent Labs    01/25/18 1128  INR 0.95    BMP: Recent Labs    01/15/18 1407  NA 143  K 4.0  CL 105  CO2 28  GLUCOSE 100*  BUN 11  CALCIUM 9.9  CREATININE 0.74  GFRNONAA >60  GFRAA >60    LIVER FUNCTION TESTS: Recent Labs    01/15/18 1407  BILITOT 0.6  AST 20  ALT 15  ALKPHOS 48  PROT 8.0  ALBUMIN 4.3    Assessment and Plan: Pt with hx of borderline ovarian cancer/adenocarcinoma 1988 with hysterectomy/bilat oophorectomy.  E coli UTI on 01/16/18.   Prior bx of abd wall mass 2015 with resection (adenocarcinoma). Recent CT on 01/15/18 reveals: 1. Mass in the midline abdominal wall just above the pubic symphysis and immediately adjacent to the bladder is concerning for recurrence of abdominal wall adenocarcinoma resected in 2015. 2. No nephrolithiasis ureterolithiasis. 3. Probable Bosniak 1 cyst of the upper pole LEFT kidney  She presents today for image guided biopsy of the abd wall mass for further evaluation. Risks and benefits discussed with the patient including, but not limited to bleeding, infection, damage to adjacent structures or low yield requiring additional tests.  All of the patient's questions were answered, patient is agreeable to proceed. Consent signed and in chart.     Electronically Signed: D. Rowe Robert,  PA-C 01/25/2018, 11:56 AM   I spent a total of 20 minutes at the the patient's bedside AND on the patient's hospital floor or unit, greater than 50% of which was counseling/coordinating care for image guided abdominal wall mass biopsy

## 2018-02-08 DIAGNOSIS — E039 Hypothyroidism, unspecified: Secondary | ICD-10-CM | POA: Diagnosis not present

## 2018-02-09 ENCOUNTER — Telehealth: Payer: Self-pay | Admitting: Cardiology

## 2018-02-09 ENCOUNTER — Other Ambulatory Visit: Payer: Self-pay | Admitting: *Deleted

## 2018-02-09 MED ORDER — APIXABAN 5 MG PO TABS: ORAL_TABLET | ORAL | 0 refills | Status: DC

## 2018-02-09 NOTE — Telephone Encounter (Signed)
Patient calling the office for samples of medication: ° ° °1.  What medication and dosage are you requesting samples for?   Eliquis 5 mg  °2.  Are you currently out of this medication? No  ° ° °

## 2018-02-09 NOTE — Telephone Encounter (Signed)
Samples of Eliquis given

## 2018-02-22 ENCOUNTER — Inpatient Hospital Stay: Payer: Medicare Other | Attending: Oncology

## 2018-02-22 ENCOUNTER — Inpatient Hospital Stay (HOSPITAL_BASED_OUTPATIENT_CLINIC_OR_DEPARTMENT_OTHER): Payer: Medicare Other | Admitting: Oncology

## 2018-02-22 ENCOUNTER — Telehealth: Payer: Self-pay | Admitting: Oncology

## 2018-02-22 VITALS — BP 125/72 | HR 70 | Temp 98.2°F | Resp 18 | Ht 66.0 in | Wt 139.3 lb

## 2018-02-22 DIAGNOSIS — C792 Secondary malignant neoplasm of skin: Secondary | ICD-10-CM

## 2018-02-22 DIAGNOSIS — C801 Malignant (primary) neoplasm, unspecified: Secondary | ICD-10-CM

## 2018-02-22 DIAGNOSIS — R971 Elevated cancer antigen 125 [CA 125]: Secondary | ICD-10-CM

## 2018-02-22 DIAGNOSIS — Z923 Personal history of irradiation: Secondary | ICD-10-CM | POA: Diagnosis not present

## 2018-02-22 DIAGNOSIS — Z87440 Personal history of urinary (tract) infections: Secondary | ICD-10-CM | POA: Insufficient documentation

## 2018-02-22 DIAGNOSIS — Z8543 Personal history of malignant neoplasm of ovary: Secondary | ICD-10-CM

## 2018-02-22 DIAGNOSIS — Z9221 Personal history of antineoplastic chemotherapy: Secondary | ICD-10-CM | POA: Diagnosis not present

## 2018-02-22 DIAGNOSIS — I4891 Unspecified atrial fibrillation: Secondary | ICD-10-CM

## 2018-02-22 DIAGNOSIS — Z90722 Acquired absence of ovaries, bilateral: Secondary | ICD-10-CM | POA: Diagnosis not present

## 2018-02-22 DIAGNOSIS — Z90710 Acquired absence of both cervix and uterus: Secondary | ICD-10-CM | POA: Insufficient documentation

## 2018-02-22 DIAGNOSIS — Z7901 Long term (current) use of anticoagulants: Secondary | ICD-10-CM | POA: Insufficient documentation

## 2018-02-22 DIAGNOSIS — C569 Malignant neoplasm of unspecified ovary: Secondary | ICD-10-CM

## 2018-02-22 NOTE — Progress Notes (Signed)
[place] OFFICE PROGRESS NOTE   Diagnosis: Metastatic adenocarcinoma  INTERVAL HISTORY:   [name] returns for a scheduled visit.  She underwent biopsy of the abdominal wall mass on 01/25/2018.  The pathology was adenocarcinoma similar to the June 2015 biopsy.  The tumor cells returned positive for cytokeratin AE1/AE3 and cytokeratin 7.  The tumor cells are negative for calretinin, WT-1, estrogen receptor, progesterone receptor, and cytokeratin 20. She reports mild discomfort in the suprapubic area.  No difficulty with bowel or bladder function.  Good appetite.  No other complaint.  She is not taking pain medication.   Objective:  Vital signs in last 24 hours:  Blood pressure 125/72, pulse 70, temperature 98.2 F (36.8 C), temperature source Oral, resp. rate 18, height 5' 6"  (1.676 m), weight 139 lb 4.8 oz (63.2 kg), SpO2 100 %.    Lymphatics: No inguinal nodes Resp: Lungs clear bilaterally Cardio: Regular rate and rhythm GI: No hepatosplenomegaly, nontender, skin thickening surrounding the low transverse abdominal incision without a discrete mass. Vascular: No leg edema  Lab Results:  Lab Results  Component Value Date   WBC 5.7 01/25/2018   HGB 12.6 01/25/2018   HCT 38.6 01/25/2018   MCV 98.2 01/25/2018   PLT 287 01/25/2018   NEUTROABS 3.6 01/25/2018    CMP  Lab Results  Component Value Date   NA 143 01/15/2018   K 4.0 01/15/2018   CL 105 01/15/2018   CO2 28 01/15/2018   GLUCOSE 100 (H) 01/15/2018   BUN 11 01/15/2018   CREATININE 0.74 01/15/2018   CALCIUM 9.9 01/15/2018   PROT 8.0 01/15/2018   ALBUMIN 4.3 01/15/2018   AST 20 01/15/2018   ALT 15 01/15/2018   ALKPHOS 48 01/15/2018   BILITOT 0.6 01/15/2018   GFRNONAA >60 01/15/2018   GFRAA >60 01/15/2018    No results found for: CEA1  Lab Results  Component Value Date   INR 0.95 01/25/2018    Imaging:  No results found.  Medications: I have reviewed the patient's current  medications.   Assessment/Plan: 1.  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   Elevated CA 125.   Cycle 1 Taxol/carboplatin 08/16/2013.   Cycle 2 Taxol/carboplatin 09/06/2013.   Cycle 3 Taxol/carboplatin 09/27/2013   CT 10/11/2013 with a stable abdominal wall mass.  Status post resection of abdominal wall mass with abdominal wall reconstruction 11/05/2013. Final pathology showed adenocarcinoma. Specimen extensively involved with adenocarcinoma which focally involved the edge of the specimen. Immunostains and Foundation 1 testing were nonspecific for a primary tumor location.  Adjuvant radiation to the abdominal wall 01/16/2014 through 02/27/2014  CT 01/15/2018 while in the emergency room for evaluation of back pain- midline abdominal wall mass above the pubic symphysis and adjacent to the bladder  Ultrasound-guided biopsy of the abdominal wall mass 01/25/2018-adenocarcinoma similar to the 2015 biopsy 2. Ovarian cancer in 1988, low-grade adenocarcinoma with areas of "borderline" carcinoma, status post a hysterectomy and bilateral oophorectomy. 3. New onset atrial fibrillation August 2016, maintained on eliquis 4. Recurrent urinary tract infections    Disposition: [name] has been diagnosed with recurrence of adenocarcinoma involving an abdominal wall mass.  The mass is in the area of the 2015 resected mass.  The adenocarcinoma is of unknown primary, though it is possible the adenocarcinoma is related to the low-grade carcinoma involving the ovary in 1988.  I discussed the diagnosis and reviewed the CT images with [name] and her family.  She was treated with chemotherapy, radiation, and surgery in 2015.  We will make a referral to GYN oncology to see if she is a candidate for repeat surgery.  I submitted the 2019 biopsy for MSI and PD1  testing.  [name] would like to undergo a staging PET scan.  She will also like to schedule a follow-up appointment with Dr. Alycia Rossetti at [facility].  She will return for an office visit in approximately 3 weeks.  25 minutes were spent with the patient today.  The majority of the time was used for counseling and coordination of care.  [name], MD  02/22/2018  3:23 PM

## 2018-02-22 NOTE — Telephone Encounter (Signed)
Appt scheduled avs/calendar printed per 10/10 los

## 2018-02-23 ENCOUNTER — Telehealth: Payer: Self-pay | Admitting: Oncology

## 2018-02-23 ENCOUNTER — Telehealth: Payer: Self-pay | Admitting: Emergency Medicine

## 2018-02-23 LAB — CA 125: Cancer Antigen (CA) 125: 19.5 U/mL (ref 0.0–38.1)

## 2018-02-23 NOTE — Telephone Encounter (Addendum)
She verbalized understanding   ----- Message from Ladell Pier, MD sent at 02/23/2018  7:16 AM EDT ----- Please call patient, ca125 is normal

## 2018-02-23 NOTE — Telephone Encounter (Signed)
Called Katrina with [facility] and advised her that Ruqayyah needs an appointment with Dr. Alycia Rossetti at [facility].  Katrina said she will need to check with Dr. Alycia Rossetti to see if she would need a new patient appointment and then will schedule her.

## 2018-02-27 DIAGNOSIS — Z23 Encounter for immunization: Secondary | ICD-10-CM | POA: Diagnosis not present

## 2018-03-01 ENCOUNTER — Encounter (HOSPITAL_COMMUNITY)
Admission: RE | Admit: 2018-03-01 | Discharge: 2018-03-01 | Disposition: A | Discharge status: Home / Self Care | Payer: Medicare Other | Source: Ambulatory Visit | Attending: Oncology | Admitting: Oncology

## 2018-03-01 DIAGNOSIS — C569 Malignant neoplasm of unspecified ovary: Secondary | ICD-10-CM | POA: Diagnosis not present

## 2018-03-01 LAB — GLUCOSE, CAPILLARY: Glucose-Capillary: 100 mg/dL — ABNORMAL HIGH (ref 70–99)

## 2018-03-01 MED ORDER — FLUDEOXYGLUCOSE F - 18 (FDG) INJECTION
6.9800 | Freq: Once | INTRAVENOUS | Status: AC | PRN
  Administered 2018-03-01: 6.98 via INTRAVENOUS

## 2018-03-02 ENCOUNTER — Telehealth: Payer: Self-pay | Admitting: *Deleted

## 2018-03-02 ENCOUNTER — Telehealth: Payer: Self-pay | Admitting: Oncology

## 2018-03-02 DIAGNOSIS — N281 Cyst of kidney, acquired: Secondary | ICD-10-CM | POA: Diagnosis not present

## 2018-03-02 DIAGNOSIS — R1900 Intra-abdominal and pelvic swelling, mass and lump, unspecified site: Secondary | ICD-10-CM | POA: Diagnosis not present

## 2018-03-02 NOTE — Telephone Encounter (Signed)
Called [name] to see if she has heard from Dr. Elenora Gamma office yet.  She said she has not.  Called [name] with [facility] who said she will call the patient today.  Called [name] back and let her know she should be receiving an appointment today.

## 2018-03-02 NOTE — Telephone Encounter (Signed)
-----   Message from Ladell Pier, MD sent at 03/02/2018  8:35 AM EDT ----- Please call patient, PET shows the known mass at the low abdominal wall, no other site of cancer, f/u as scheduled

## 2018-03-02 NOTE — Telephone Encounter (Signed)
Discussed results with patient. She appreciated the call. Confirmed appt for 10/29 with Ned Card, NP

## 2018-03-06 DIAGNOSIS — L821 Other seborrheic keratosis: Secondary | ICD-10-CM | POA: Diagnosis not present

## 2018-03-06 DIAGNOSIS — C801 Malignant (primary) neoplasm, unspecified: Secondary | ICD-10-CM | POA: Diagnosis not present

## 2018-03-08 ENCOUNTER — Encounter: Payer: Self-pay | Admitting: Cardiology

## 2018-03-08 ENCOUNTER — Ambulatory Visit (INDEPENDENT_AMBULATORY_CARE_PROVIDER_SITE_OTHER): Payer: Medicare Other | Admitting: Cardiology

## 2018-03-08 VITALS — BP 106/62 | HR 64 | Ht 66.0 in | Wt 142.0 lb

## 2018-03-08 DIAGNOSIS — I480 Paroxysmal atrial fibrillation: Secondary | ICD-10-CM

## 2018-03-08 MED ORDER — APIXABAN 5 MG PO TABS: ORAL_TABLET | ORAL | 0 refills | Status: DC

## 2018-03-08 NOTE — Patient Instructions (Signed)

## 2018-03-08 NOTE — Progress Notes (Signed)
Clinical Summary [name] is a 72 y.o.female seen today for follow up of the following medical problems.   1. Afib - new diagnosis during 12/2014 admission, presented with palpitations and left arm pain - converted back to NSR while admitted, discharged on low dose lopressor, higher doses caused some low heart rates and soft bp's.  - echo LVEF 55-60%, mod LAE.    - no recent palpitations - no bleeding on eliquis.   2. Metatstic Adenocardinoma Ovarian - followed by oncology   SH: works as Solicitor   Past Medical History:  Diagnosis Date  . Anxiety    new dx  . Cancer ([place]) 1988, 2015   ovarian, adenocarcinoma  . GERD (gastroesophageal reflux disease)    hx of years ago  . Hx of radiation therapy 01/16/14-02/27/14   abdomen   . Hypercholesteremia    under control with diet and fish oil  . Hypertension   . Ovarian cyst   . PONV (postoperative nausea and vomiting)   . Restless leg syndrome   . Thyroid disease      No Known Allergies   Current Outpatient Medications  Medication Sig Dispense Refill  . ALPRAZolam (XANAX) 0.25 MG tablet Take 0.25 mg by mouth at bedtime as needed for sleep.     Marland Kitchen apixaban (ELIQUIS) 5 MG TABS tablet TAKE (1) TABLET BY MOUTH TWICE DAILY. 42 tablet 0  . Calcium Carb-Cholecalciferol (CALCIUM 600 + D PO) Take 1 tablet by mouth daily.    . Cholecalciferol (EQL VITAMIN D3) 1000 UNITS tablet Take 1,000 Units by mouth daily.    Marland Kitchen estrogens, conjugated, (PREMARIN) 0.625 MG tablet Take 1 tablet (0.625 mg total) by mouth See admin instructions. Take daily for 21 days then do not take for 7 days. 30 tablet 6  . HYDROcodone-acetaminophen (NORCO/VICODIN) 5-325 MG tablet Take 1 tablet by mouth every 6 (six) hours as needed for moderate pain (restless legs). 30 tablet 0  . HYDROcodone-acetaminophen (NORCO/VICODIN) 5-325 MG tablet Take 1-2 tablets by mouth every 6 (six) hours as needed for moderate pain or severe pain. 20 tablet 0  .  levothyroxine (SYNTHROID, LEVOTHROID) 75 MCG tablet Take 75 mcg by mouth every morning.     . metoprolol tartrate (LOPRESSOR) 25 MG tablet TAKE (1/2) TABLET BY MOUTH TWICE DAILY. (Patient taking differently: Take 12.5 mg by mouth 2 (two) times daily. TAKE (1/2) TABLET BY MOUTH TWICE DAILY.) 90 tablet 2  . Multiple Vitamins-Minerals (MULTIVITAMIN WITH MINERALS) tablet Take 1 tablet by mouth daily.     . Omega-3 Fatty Acids (FISH OIL) 1200 MG CAPS Take 1,200 mg by mouth daily.     Marland Kitchen rOPINIRole (REQUIP) 0.25 MG tablet Take 0.25-0.75 mg by mouth See admin instructions. Pt takes 1 tablet nightly but can take up to 3 if needed for restless leg     No current facility-administered medications for this visit.      Past Surgical History:  Procedure Laterality Date  . APPENDECTOMY    . APPLICATION OF A-CELL OF CHEST/ABDOMEN N/A 11/05/2013   Procedure: ABDOMINAL WALL RESCONTRUCTION WITH STRATUS;  Surgeon: Irene Limbo, MD;  Location: [facility];  Service: Plastics;  Laterality: N/A;  . CHOLECYSTECTOMY  2009  . LAPAROTOMY N/A 11/05/2013   Procedure: RESECTION OF PELVIC MASS;  Surgeon: Imagene Gurney A. Alycia Rossetti, MD;  Location: [facility];  Service: Gynecology;  Laterality: N/A;  . OOPHORECTOMY     BSO  . TONSILLECTOMY  as child  . TOTAL ABDOMINAL HYSTERECTOMY  1988   ovarian cyst  BSO     No Known Allergies    Family History  Problem Relation Age of Onset  . Heart disease Mother   . Heart disease Sister   . Diabetes Sister   . Cancer Sister        melanoma-skin cancer  . Breast cancer Paternal Grandmother        Age 56's  . Cancer Paternal Grandmother        breast  . Cancer Paternal Grandfather        prostate/bladder     Social History [name] reports that she has never smoked. She has never used smokeless tobacco. [name] reports that she does not drink alcohol.   Review of Systems CONSTITUTIONAL: No weight loss, fever, chills, weakness or fatigue.  HEENT: Eyes: No visual loss,  blurred vision, double vision or yellow sclerae.No hearing loss, sneezing, congestion, runny nose or sore throat.  SKIN: No rash or itching.  CARDIOVASCULAR: per hpi RESPIRATORY: No shortness of breath, cough or sputum.  GASTROINTESTINAL: No anorexia, nausea, vomiting or diarrhea. No abdominal pain or blood.  GENITOURINARY: No burning on urination, no polyuria NEUROLOGICAL: No headache, dizziness, syncope, paralysis, ataxia, numbness or tingling in the extremities. No change in bowel or bladder control.  MUSCULOSKELETAL: No muscle, back pain, joint pain or stiffness.  LYMPHATICS: No enlarged nodes. No history of splenectomy.  PSYCHIATRIC: No history of depression or anxiety.  ENDOCRINOLOGIC: No reports of sweating, cold or heat intolerance. No polyuria or polydipsia.  Marland Kitchen   Physical Examination Vitals:   03/08/18 1309  BP: 106/62  Pulse: 64  SpO2: 98%   Vitals:   03/08/18 1309  Weight: 142 lb (64.4 kg)  Height: 5\' 6"  (1.676 m)    Gen: resting comfortably, no acute distress HEENT: no scleral icterus, pupils equal round and reactive, no palptable cervical adenopathy,  CV: RRR, no m/r/g, no jvd Resp: Clear to auscultation bilaterally GI: abdomen is soft, non-tender, non-distended, normal bowel sounds, no hepatosplenomegaly MSK: extremities are warm, no edema.  Skin: warm, no rash Neuro:  no focal deficits Psych: appropriate affect   Diagnostic Studies 12/2014 echo Study Conclusions  - Left ventricle: The cavity size was normal. Wall thickness was normal. Systolic function was normal. The estimated ejection fraction was in the range of 55% to 60%. Wall motion was normal; there were no regional wall motion abnormalities. Left ventricular diastolic function parameters were normal. - Mitral valve: Mildly calcified annulus. There was mild regurgitation. - Left atrium: The atrium was moderately dilated. Volume/bsa, S: 37.9 ml/m^2. - Right atrium: The atrium was  mildly to moderately dilated. - Tricuspid valve: There was mild-moderate regurgitation.     Assessment and Plan  1. PAF  She will conitnue eliquis, CHADS2Vasc score is 2.  -- no symptoms. She has done well on current medical therapy for the last few years,continue current medications   F/u 1 year.      Arnoldo Lenis, M.D.

## 2018-03-12 DIAGNOSIS — C801 Malignant (primary) neoplasm, unspecified: Secondary | ICD-10-CM | POA: Diagnosis not present

## 2018-03-12 DIAGNOSIS — Z8543 Personal history of malignant neoplasm of ovary: Secondary | ICD-10-CM | POA: Diagnosis not present

## 2018-03-12 DIAGNOSIS — Z7901 Long term (current) use of anticoagulants: Secondary | ICD-10-CM | POA: Diagnosis not present

## 2018-03-12 DIAGNOSIS — Z79899 Other long term (current) drug therapy: Secondary | ICD-10-CM | POA: Diagnosis not present

## 2018-03-12 DIAGNOSIS — E785 Hyperlipidemia, unspecified: Secondary | ICD-10-CM | POA: Diagnosis not present

## 2018-03-12 DIAGNOSIS — I480 Paroxysmal atrial fibrillation: Secondary | ICD-10-CM | POA: Diagnosis not present

## 2018-03-12 DIAGNOSIS — Z9221 Personal history of antineoplastic chemotherapy: Secondary | ICD-10-CM | POA: Diagnosis not present

## 2018-03-12 DIAGNOSIS — I10 Essential (primary) hypertension: Secondary | ICD-10-CM | POA: Diagnosis not present

## 2018-03-12 DIAGNOSIS — C792 Secondary malignant neoplasm of skin: Secondary | ICD-10-CM | POA: Diagnosis not present

## 2018-03-12 DIAGNOSIS — Z01818 Encounter for other preprocedural examination: Secondary | ICD-10-CM | POA: Diagnosis not present

## 2018-03-12 DIAGNOSIS — K219 Gastro-esophageal reflux disease without esophagitis: Secondary | ICD-10-CM | POA: Diagnosis not present

## 2018-03-12 DIAGNOSIS — E039 Hypothyroidism, unspecified: Secondary | ICD-10-CM | POA: Diagnosis not present

## 2018-03-13 ENCOUNTER — Inpatient Hospital Stay (HOSPITAL_BASED_OUTPATIENT_CLINIC_OR_DEPARTMENT_OTHER): Payer: Medicare Other | Admitting: Nurse Practitioner

## 2018-03-13 ENCOUNTER — Telehealth: Payer: Self-pay | Admitting: Oncology

## 2018-03-13 VITALS — BP 112/69 | HR 66 | Temp 97.7°F | Resp 17 | Ht 66.0 in | Wt 141.7 lb

## 2018-03-13 DIAGNOSIS — Z7901 Long term (current) use of anticoagulants: Secondary | ICD-10-CM | POA: Diagnosis not present

## 2018-03-13 DIAGNOSIS — C801 Malignant (primary) neoplasm, unspecified: Secondary | ICD-10-CM

## 2018-03-13 DIAGNOSIS — Z87440 Personal history of urinary (tract) infections: Secondary | ICD-10-CM

## 2018-03-13 DIAGNOSIS — I4891 Unspecified atrial fibrillation: Secondary | ICD-10-CM

## 2018-03-13 DIAGNOSIS — Z90722 Acquired absence of ovaries, bilateral: Secondary | ICD-10-CM

## 2018-03-13 DIAGNOSIS — Z8543 Personal history of malignant neoplasm of ovary: Secondary | ICD-10-CM | POA: Diagnosis not present

## 2018-03-13 DIAGNOSIS — Z90710 Acquired absence of both cervix and uterus: Secondary | ICD-10-CM | POA: Diagnosis not present

## 2018-03-13 DIAGNOSIS — C792 Secondary malignant neoplasm of skin: Secondary | ICD-10-CM

## 2018-03-13 DIAGNOSIS — R971 Elevated cancer antigen 125 [CA 125]: Secondary | ICD-10-CM

## 2018-03-13 DIAGNOSIS — Z9221 Personal history of antineoplastic chemotherapy: Secondary | ICD-10-CM | POA: Diagnosis not present

## 2018-03-13 DIAGNOSIS — C569 Malignant neoplasm of unspecified ovary: Secondary | ICD-10-CM

## 2018-03-13 DIAGNOSIS — Z923 Personal history of irradiation: Secondary | ICD-10-CM

## 2018-03-13 NOTE — Telephone Encounter (Signed)
Scheduled appt per 10/29 los - gave patient AVS And calender per los.

## 2018-03-13 NOTE — Progress Notes (Addendum)
  [place] OFFICE PROGRESS NOTE   Diagnosis:  Metastatic adenocarcinoma  INTERVAL HISTORY:   [name] returns as scheduled.  She overall feels well.  She has occasional low abdominal discomfort.  Bowels moving regularly.  No urinary symptoms.  She has a good appetite.  Objective:  Vital signs in last 24 hours:  Blood pressure 112/69, pulse 66, temperature 97.7 F (36.5 C), temperature source Oral, resp. rate 17, height 5\' 6"  (1.676 m), weight 141 lb 11.2 oz (64.3 kg), SpO2 100 %.    HEENT: No thrush or ulcers. Resp: Lungs clear bilaterally. Cardio: Regular rate and rhythm. GI: Abdomen soft and nontender.  No hepatomegaly. Vascular: No leg edema.   Lab Results:  Lab Results  Component Value Date   WBC 5.7 01/25/2018   HGB 12.6 01/25/2018   HCT 38.6 01/25/2018   MCV 98.2 01/25/2018   PLT 287 01/25/2018   NEUTROABS 3.6 01/25/2018    Imaging:  No results found.  Medications: I have reviewed the patient's current medications.  Assessment/Plan: 1.  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   Elevated CA 125.   Cycle 1 Taxol/carboplatin 08/16/2013.   Cycle 2 Taxol/carboplatin 09/06/2013.   Cycle 3 Taxol/carboplatin 09/27/2013   CT 10/11/2013 with a stable abdominal wall mass.  Status post resection of abdominal wall mass with abdominal wall reconstruction 11/05/2013. Final pathology showed adenocarcinoma. Specimen extensively involved with adenocarcinoma which focally involved the edge of the specimen. Immunostains and Foundation 1 testing were nonspecific for a primary tumor location.  Adjuvant radiation to the abdominal wall 01/16/2014 through 02/27/2014  CT 01/15/2018 while in the emergency room for evaluation of back pain- midline abdominal wall mass above the pubic symphysis and adjacent to the  bladder  Ultrasound-guided biopsy of the abdominal wall mass 01/25/2018-adenocarcinoma similar to the 2015 biopsy  PET scan 03/01/2018-hypermetabolism associated with a cystic and solid suprapubic mass.  Focal uptake identified in the left L3-4 facets without underlying bony lesion.  No other sites of unexpected or suspicious hypermetabolism in the neck, chest, abdomen or pelvis. 2. Ovarian cancer in 1988, low-grade adenocarcinoma with areas of "borderline" carcinoma, status post a hysterectomy and bilateral oophorectomy. 3. New onset atrial fibrillation August 2016, maintained on eliquis 4. Recurrent urinary tract infections    Disposition: [name] has recurrence of adenocarcinoma involving an abdominal wall mass.  She saw Dr. Alycia Rossetti at [facility] 03/12/2018.  The plan is for exploratory laparotomy, resection of abdominal wall mass, cystoscopy, possible abdominal wall reconstruction on 03/28/2018.  We will plan to see her back on 04/16/2018 to review the pathology report.  Patient seen with Dr. Benay Spice.    Ned Card ANP/GNP-BC   03/13/2018  2:34 PM  This was a shared visit with Ned Card.  We concur with the plan for surgical resection.  She will return for an office visit here approximately 2 weeks after surgery.  [name], MD

## 2018-03-16 HISTORY — PX: LAPAROTOMY: SHX154

## 2018-03-21 ENCOUNTER — Encounter: Payer: Self-pay | Admitting: Cardiology

## 2018-03-21 ENCOUNTER — Ambulatory Visit (INDEPENDENT_AMBULATORY_CARE_PROVIDER_SITE_OTHER): Payer: Medicare Other | Admitting: Cardiology

## 2018-03-21 VITALS — BP 100/60 | HR 60 | Ht 66.0 in | Wt 143.2 lb

## 2018-03-21 DIAGNOSIS — Z01810 Encounter for preprocedural cardiovascular examination: Secondary | ICD-10-CM | POA: Diagnosis not present

## 2018-03-21 MED ORDER — APIXABAN 5 MG PO TABS: ORAL_TABLET | ORAL | 0 refills | Status: DC

## 2018-03-21 NOTE — Patient Instructions (Signed)
Your physician wants you to follow-up in: [place] will receive a reminder letter in the mail two months in advance. If you don't receive a letter, please call our office to schedule the follow-up appointment.  Your physician recommends that you continue on your current medications as directed. Please refer to the Current Medication list given to you today.  WE HAVE GIVEN YOU SAMPLES OF ELIQUIS   Thank you for choosing [facility]!!

## 2018-03-21 NOTE — Progress Notes (Signed)
Clinical Summary [name] is a 72 y.o.female seen today for follow up of the following medical problems. This is a focused visit for preoperative evaluation.    1. 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 Preoperative evaluation - being considered for resection of her abdominal wall mass related to recurrent ovarian cancer at [facility]  - no recent chest pain, no SOB - highest level of activity is housework. - walks up flight of steps at the [employer] where she works multiple times a day without symptoms. ,     SH: works Scientist, product/process development   Past Medical History:  Diagnosis Date  . Anxiety    new dx  . Cancer ([place]) 1988, 2015   ovarian, adenocarcinoma  . GERD (gastroesophageal reflux disease)    hx of years ago  . Hx of radiation therapy 01/16/14-02/27/14   abdomen   . Hypercholesteremia    under control with diet and fish oil  . Hypertension   . Ovarian cyst   . PONV (postoperative nausea and vomiting)   . Restless leg syndrome   . Thyroid disease      No Known Allergies   Current Outpatient Medications  Medication Sig Dispense Refill  . ALPRAZolam (XANAX) 0.25 MG tablet Take 0.25 mg by mouth at bedtime as needed for sleep.     Marland Kitchen apixaban (ELIQUIS) 5 MG TABS tablet TAKE (1) TABLET BY MOUTH TWICE DAILY. 42 tablet 0  . Calcium Carb-Cholecalciferol (CALCIUM 600 + D PO) Take 1 tablet by mouth daily.    . Cholecalciferol (EQL VITAMIN D3) 1000 UNITS tablet Take 1,000 Units by mouth daily.    Marland Kitchen estrogens, conjugated, (PREMARIN) 0.625 MG tablet Take 1 tablet (0.625 mg total) by mouth See admin instructions. Take daily for 21 days then do not take for 7 days. 30 tablet 6  . HYDROcodone-acetaminophen (NORCO/VICODIN) 5-325 MG tablet Take 1 tablet by mouth every 6 (six) hours as needed for moderate pain (restless legs). 30 tablet 0  . HYDROcodone-acetaminophen (NORCO/VICODIN) 5-325 MG tablet Take 1-2 tablets by mouth every 6 (six) hours as needed for  moderate pain or severe pain. 20 tablet 0  . levothyroxine (SYNTHROID, LEVOTHROID) 75 MCG tablet Take 75 mcg by mouth every morning.     . metoprolol tartrate (LOPRESSOR) 25 MG tablet TAKE (1/2) TABLET BY MOUTH TWICE DAILY. (Patient taking differently: Take 12.5 mg by mouth 2 (two) times daily. TAKE (1/2) TABLET BY MOUTH TWICE DAILY.) 90 tablet 2  . Multiple Vitamins-Minerals (MULTIVITAMIN WITH MINERALS) tablet Take 1 tablet by mouth daily.     . Omega-3 Fatty Acids (FISH OIL) 1200 MG CAPS Take 1,200 mg by mouth daily.     Marland Kitchen rOPINIRole (REQUIP) 0.25 MG tablet Take 0.25-0.75 mg by mouth See admin instructions. Pt takes 1 tablet nightly but can take up to 3 if needed for restless leg     No current facility-administered medications for this visit.      Past Surgical History:  Procedure Laterality Date  . APPENDECTOMY    . APPLICATION OF A-CELL OF CHEST/ABDOMEN N/A 11/05/2013   Procedure: ABDOMINAL WALL RESCONTRUCTION WITH STRATUS;  Surgeon: Irene Limbo, MD;  Location: [facility];  Service: Plastics;  Laterality: N/A;  . CHOLECYSTECTOMY  2009  . LAPAROTOMY N/A 11/05/2013   Procedure: RESECTION OF PELVIC MASS;  Surgeon: Imagene Gurney A. Alycia Rossetti, MD;  Location: [facility];  Service: Gynecology;  Laterality: N/A;  . OOPHORECTOMY     BSO  . TONSILLECTOMY  as child  . TOTAL ABDOMINAL HYSTERECTOMY  1988   ovarian cyst  BSO     No Known Allergies    Family History  Problem Relation Age of Onset  . Heart disease Mother   . Heart disease Sister   . Diabetes Sister   . Cancer Sister        melanoma-skin cancer  . Breast cancer Paternal Grandmother        Age 35's  . Cancer Paternal Grandmother        breast  . Cancer Paternal Grandfather        prostate/bladder     Social History [name] reports that she has never smoked. She has never used smokeless tobacco. [name] reports that she does not drink alcohol.   Review of Systems CONSTITUTIONAL: No weight loss, fever, chills, weakness  or fatigue.  HEENT: Eyes: No visual loss, blurred vision, double vision or yellow sclerae.No hearing loss, sneezing, congestion, runny nose or sore throat.  SKIN: No rash or itching.  CARDIOVASCULAR: no chest pain, no palpitations.  RESPIRATORY: No shortness of breath, cough or sputum.  GASTROINTESTINAL: No anorexia, nausea, vomiting or diarrhea. No abdominal pain or blood.  GENITOURINARY: No burning on urination, no polyuria NEUROLOGICAL: No headache, dizziness, syncope, paralysis, ataxia, numbness or tingling in the extremities. No change in bowel or bladder control.  MUSCULOSKELETAL: No muscle, back pain, joint pain or stiffness.  LYMPHATICS: No enlarged nodes. No history of splenectomy.  PSYCHIATRIC: No history of depression or anxiety.  ENDOCRINOLOGIC: No reports of sweating, cold or heat intolerance. No polyuria or polydipsia.  Marland Kitchen   Physical Examination Today's Vitals   03/21/18 1155  BP: 100/60  Pulse: 60  SpO2: 97%  Weight: 143 lb 3.2 oz (65 kg)  Height: 5\' 6"  (1.676 m)   Body mass index is 23.11 kg/m. Vitals:   03/21/18 1155  Weight: 143 lb 3.2 oz (65 kg)  Height: 5\' 6"  (1.676 m)    Gen: resting comfortably, no acute distress HEENT: no scleral icterus, pupils equal round and reactive, no palptable cervical adenopathy,  CV: RRR, no m/r/g, no jvd Resp: Clear to auscultation bilaterally GI: abdomen is soft, non-tender, non-distended, normal bowel sounds, no hepatosplenomegaly MSK: extremities are warm, no edema.  Skin: warm, no rash Neuro:  no focal deficits Psych: appropriate affect   Diagnostic Studies 12/2014 echo Study Conclusions  - Left ventricle: The cavity size was normal. Wall thickness was normal. Systolic function was normal. The estimated ejection fraction was in the range of 55% to 60%. Wall motion was normal; there were no regional wall motion abnormalities. Left ventricular diastolic function parameters were normal. - Mitral valve: Mildly  calcified annulus. There was mild regurgitation. - Left atrium: The atrium was moderately dilated. Volume/bsa, S: 37.9 ml/m^2. - Right atrium: The atrium was mildly to moderately dilated. - Tricuspid valve: There was mild-moderate regurgitation.    Assessment and Plan  1. Preoperative evaluation - patient being considered for surgery for abdominal mass - no acute cardiac conidtions at this time - tolerates over 4 METs without limitation or symptoms - recommend proceeding with surgery as planned from cardiac standpoint. - hold eliquis 48 hrs before, restart day after surgery.     F/u 1 year  Arnoldo Lenis, M.D.

## 2018-03-22 ENCOUNTER — Telehealth: Payer: Self-pay | Admitting: Cardiology

## 2018-03-22 NOTE — Telephone Encounter (Signed)
Dr. Kathrin Ruddy called concerning the pt's surgical clearance. She sent a fax earlier in the week regarding cardiac clearance for an upcoming surgery and recommendations regarding Eliquis.   Please return her call @ [phone]

## 2018-03-22 NOTE — Telephone Encounter (Signed)
Dr Brendia Sacks aware that clearance was faxed yesterday after pt OV - she has been out of the office and will confirm with her staff that this was received - aware that we could fax again if necessary

## 2018-03-28 ENCOUNTER — Encounter: Payer: Self-pay | Admitting: Gynecologic Oncology

## 2018-03-28 DIAGNOSIS — R918 Other nonspecific abnormal finding of lung field: Secondary | ICD-10-CM | POA: Diagnosis not present

## 2018-03-28 DIAGNOSIS — C792 Secondary malignant neoplasm of skin: Secondary | ICD-10-CM | POA: Diagnosis present

## 2018-03-28 DIAGNOSIS — I10 Essential (primary) hypertension: Secondary | ICD-10-CM | POA: Diagnosis present

## 2018-03-28 DIAGNOSIS — K219 Gastro-esophageal reflux disease without esophagitis: Secondary | ICD-10-CM | POA: Diagnosis present

## 2018-03-28 DIAGNOSIS — Z923 Personal history of irradiation: Secondary | ICD-10-CM | POA: Diagnosis not present

## 2018-03-28 DIAGNOSIS — I4891 Unspecified atrial fibrillation: Secondary | ICD-10-CM | POA: Diagnosis present

## 2018-03-28 DIAGNOSIS — E785 Hyperlipidemia, unspecified: Secondary | ICD-10-CM | POA: Diagnosis present

## 2018-03-28 DIAGNOSIS — C7989 Secondary malignant neoplasm of other specified sites: Secondary | ICD-10-CM | POA: Diagnosis not present

## 2018-03-28 DIAGNOSIS — C579 Malignant neoplasm of female genital organ, unspecified: Secondary | ICD-10-CM | POA: Diagnosis not present

## 2018-03-28 DIAGNOSIS — N281 Cyst of kidney, acquired: Secondary | ICD-10-CM | POA: Diagnosis present

## 2018-03-28 DIAGNOSIS — N329 Bladder disorder, unspecified: Secondary | ICD-10-CM | POA: Diagnosis present

## 2018-03-28 DIAGNOSIS — Z7901 Long term (current) use of anticoagulants: Secondary | ICD-10-CM | POA: Diagnosis not present

## 2018-03-28 DIAGNOSIS — Z9221 Personal history of antineoplastic chemotherapy: Secondary | ICD-10-CM | POA: Diagnosis not present

## 2018-03-28 DIAGNOSIS — K567 Ileus, unspecified: Secondary | ICD-10-CM | POA: Diagnosis not present

## 2018-03-28 DIAGNOSIS — K5730 Diverticulosis of large intestine without perforation or abscess without bleeding: Secondary | ICD-10-CM | POA: Diagnosis present

## 2018-03-28 DIAGNOSIS — R112 Nausea with vomiting, unspecified: Secondary | ICD-10-CM | POA: Diagnosis not present

## 2018-03-28 DIAGNOSIS — C801 Malignant (primary) neoplasm, unspecified: Secondary | ICD-10-CM | POA: Diagnosis not present

## 2018-03-28 DIAGNOSIS — R1111 Vomiting without nausea: Secondary | ICD-10-CM | POA: Diagnosis not present

## 2018-03-28 DIAGNOSIS — R1900 Intra-abdominal and pelvic swelling, mass and lump, unspecified site: Secondary | ICD-10-CM | POA: Diagnosis not present

## 2018-03-28 DIAGNOSIS — Z90710 Acquired absence of both cervix and uterus: Secondary | ICD-10-CM | POA: Diagnosis not present

## 2018-03-28 DIAGNOSIS — Z9049 Acquired absence of other specified parts of digestive tract: Secondary | ICD-10-CM | POA: Diagnosis not present

## 2018-04-16 ENCOUNTER — Telehealth: Payer: Self-pay

## 2018-04-16 ENCOUNTER — Inpatient Hospital Stay: Payer: Medicare Other | Attending: Oncology | Admitting: Nurse Practitioner

## 2018-04-16 ENCOUNTER — Encounter: Payer: Self-pay | Admitting: Nurse Practitioner

## 2018-04-16 VITALS — BP 128/58 | HR 80 | Temp 97.5°F | Resp 18 | Ht 66.0 in | Wt 137.5 lb

## 2018-04-16 DIAGNOSIS — Z87440 Personal history of urinary (tract) infections: Secondary | ICD-10-CM | POA: Diagnosis not present

## 2018-04-16 DIAGNOSIS — R109 Unspecified abdominal pain: Secondary | ICD-10-CM | POA: Diagnosis not present

## 2018-04-16 DIAGNOSIS — Z8543 Personal history of malignant neoplasm of ovary: Secondary | ICD-10-CM | POA: Insufficient documentation

## 2018-04-16 DIAGNOSIS — Z90722 Acquired absence of ovaries, bilateral: Secondary | ICD-10-CM | POA: Insufficient documentation

## 2018-04-16 DIAGNOSIS — I4891 Unspecified atrial fibrillation: Secondary | ICD-10-CM | POA: Diagnosis not present

## 2018-04-16 DIAGNOSIS — C569 Malignant neoplasm of unspecified ovary: Secondary | ICD-10-CM

## 2018-04-16 DIAGNOSIS — Z90710 Acquired absence of both cervix and uterus: Secondary | ICD-10-CM | POA: Diagnosis not present

## 2018-04-16 DIAGNOSIS — C792 Secondary malignant neoplasm of skin: Secondary | ICD-10-CM | POA: Insufficient documentation

## 2018-04-16 MED ORDER — HYDROCODONE-ACETAMINOPHEN 5-325 MG PO TABS: 1.0000 | ORAL_TABLET | Freq: Three times a day (TID) | ORAL | 0 refills | Status: DC | PRN

## 2018-04-16 NOTE — Progress Notes (Addendum)
[place] OFFICE PROGRESS NOTE   Diagnosis: Metastatic adenocarcinoma  INTERVAL HISTORY:   [name] returns as scheduled.  She underwent exploratory laparotomy, resection of abdominal wall tumor by Dr. Alycia Rossetti at [facility] on 03/28/2018.  She was discharged home 04/03/2018.  She has intermittent abdominal pain.  She would like a prescription for hydrocodone.  No nausea or vomiting.  Bowels are moving.  Urinary symptoms.  No fever.  Objective:  Vital signs in last 24 hours:  Blood pressure (!) 128/58, pulse 80, temperature (!) 97.5 F (36.4 C), temperature source Oral, resp. rate 18, height 5\' 6"  (1.676 m), weight 137 lb 8 oz (62.4 kg), SpO2 100 %.    HEENT: No thrush or ulcers. Resp: Lungs clear bilaterally. Cardio: Regular rate and rhythm. GI: Abdomen soft and nontender.  No hepatomegaly.  Healed low abdominal/pelvic surgical incision.  Edema/mild erythema inferior to the incision. Vascular: No leg edema.    Lab Results:  Lab Results  Component Value Date   WBC 5.7 01/25/2018   HGB 12.6 01/25/2018   HCT 38.6 01/25/2018   MCV 98.2 01/25/2018   PLT 287 01/25/2018   NEUTROABS 3.6 01/25/2018    Imaging:  No results found.  Medications: I have reviewed the patient's current medications.  Assessment/Plan: 1. 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   Elevated CA 125.   Cycle 1 Taxol/carboplatin 08/16/2013.   Cycle 2 Taxol/carboplatin 09/06/2013.   Cycle 3 Taxol/carboplatin 09/27/2013   CT 10/11/2013 with a stable abdominal wall mass.  Status post resection of abdominal wall mass with abdominal wall reconstruction 11/05/2013. Final pathology showed adenocarcinoma. Specimen extensively involved with adenocarcinoma which focally involved the edge of the specimen. Immunostains and Foundation 1 testing were  nonspecific for a primary tumor location.  Adjuvant radiation to the abdominal wall 01/16/2014 through 02/27/2014  CT 01/15/2018 while in the emergency room for evaluation of back pain-midline abdominal wall mass above the pubic symphysis and adjacent to the bladder  Ultrasound-guided biopsy of the abdominal wall mass 01/25/2018-adenocarcinoma similar to the 2015 biopsy  PET scan 03/01/2018-hypermetabolism associated with a cystic and solid suprapubic mass.  Focal uptake identified in the left L3-4 facets without underlying bony lesion.  No other sites of unexpected or suspicious hypermetabolism in the neck, chest, abdomen or pelvis.  Exploratory laparotomy, resection of abdominal wall tumor, oversew of bladder peritoneum, cystoscopy 03/28/2018 (findings included a cystic and solid mass encountered below the lower edge of the incision extending into the space of retzius and adherent to the pubic arch inferiorly, bladder dome posteriorly and prior abdominal wall repair anteriorly.  No nodularity palpated in the tumor bed.  Visualized bowel and omentum free of disease.  Pelvic peritoneum appeared normal).  A "small "amount of tumor on the periosteum was cauterized using a Bovie.  Pathology on pelvic tumor-metastatic high-grade adenocarcinoma, favor clear-cell carcinoma of gynecologic origin, fragments up to 5.8 cm in size; ER negative, PR positive, 1+ (weak staining) to focally 2+, 30-40% 2. Ovarian cancer in 1988, low-grade adenocarcinoma with areas of "borderline" carcinoma, status post a hysterectomy and bilateral oophorectomy. 3. New onset atrial fibrillation August 2016, maintained on eliquis 4. Recurrent urinary tract infections   Disposition: [name] appears stable.  She continues to recover from the recent surgery.  Dr. Benay Stein reviewed the pathology report with [name] and her family at today's visit.  She is scheduled to follow-up with Dr. Alycia Rossetti on 04/30/2018.  We will see her in  follow-up here on 05/03/2018.  A prescription was sent to her pharmacy for Norco 5/325 1 tablet every 8 hours as needed.  Patient seen with Dr. Benay Stein.    [name] ANP/GNP-BC   04/16/2018  3:04 PM  This was a shared visit with [name].  [name] was interviewed and examined.  She underwent an exploratory laparotomy and resection of abdominal wall tumor on 03/28/2018.  The mass was grossly resected.  The pathology revealed metastatic high-grade adenocarcinoma favoring a clear cell carcinoma of gynecologic origin.  I reviewed the operative note and pathology report. She appears to have another local recurrence of the "borderline "ovarian cancer diagnosed in 1988.  We discussed "adjuvant "treatment options including chemotherapy and radiation.  She understands there is a significant chance of developing recurrent disease in the future.  We will contact Dr. Alycia Rossetti to discuss treatment options.  [name] will return for an office visit here in approximately 3 weeks.  Danielle Manson, MD

## 2018-04-16 NOTE — Telephone Encounter (Signed)
Printed avs and calender of upcoming appointment. Per 12/2 los

## 2018-04-20 ENCOUNTER — Telehealth: Payer: Self-pay | Admitting: Oncology

## 2018-04-20 NOTE — Telephone Encounter (Signed)
Faxed request for ER/PR testing on case [id number] to [facility] at [phone].

## 2018-04-30 DIAGNOSIS — C801 Malignant (primary) neoplasm, unspecified: Secondary | ICD-10-CM | POA: Diagnosis not present

## 2018-04-30 DIAGNOSIS — Z09 Encounter for follow-up examination after completed treatment for conditions other than malignant neoplasm: Secondary | ICD-10-CM | POA: Diagnosis not present

## 2018-05-03 ENCOUNTER — Telehealth: Payer: Self-pay | Admitting: Cardiology

## 2018-05-03 ENCOUNTER — Inpatient Hospital Stay (HOSPITAL_BASED_OUTPATIENT_CLINIC_OR_DEPARTMENT_OTHER): Payer: Medicare Other | Admitting: Nurse Practitioner

## 2018-05-03 ENCOUNTER — Encounter: Payer: Self-pay | Admitting: Nurse Practitioner

## 2018-05-03 DIAGNOSIS — C792 Secondary malignant neoplasm of skin: Secondary | ICD-10-CM | POA: Diagnosis not present

## 2018-05-03 DIAGNOSIS — Z90722 Acquired absence of ovaries, bilateral: Secondary | ICD-10-CM | POA: Diagnosis not present

## 2018-05-03 DIAGNOSIS — Z8543 Personal history of malignant neoplasm of ovary: Secondary | ICD-10-CM | POA: Diagnosis not present

## 2018-05-03 DIAGNOSIS — I4891 Unspecified atrial fibrillation: Secondary | ICD-10-CM | POA: Diagnosis not present

## 2018-05-03 DIAGNOSIS — Z87440 Personal history of urinary (tract) infections: Secondary | ICD-10-CM

## 2018-05-03 DIAGNOSIS — Z90710 Acquired absence of both cervix and uterus: Secondary | ICD-10-CM

## 2018-05-03 DIAGNOSIS — R109 Unspecified abdominal pain: Secondary | ICD-10-CM | POA: Diagnosis not present

## 2018-05-03 DIAGNOSIS — C569 Malignant neoplasm of unspecified ovary: Secondary | ICD-10-CM | POA: Insufficient documentation

## 2018-05-03 MED ORDER — HYDROCODONE-ACETAMINOPHEN 5-325 MG PO TABS: 1.0000 | ORAL_TABLET | Freq: Three times a day (TID) | ORAL | 0 refills | Status: DC | PRN

## 2018-05-03 NOTE — Telephone Encounter (Signed)
Requested samples for apixaban (ELIQUIS) 5 MG TABS tablet [[id number]]  Pt has one pill left, will have to pay over $400 for her Rx

## 2018-05-03 NOTE — Telephone Encounter (Signed)
Left message for pt advising her that I left her 2 boxes of samples at front office for her.

## 2018-05-03 NOTE — Progress Notes (Signed)
START OFF PATHWAY REGIMEN - Ovarian   [id number]:Carboplatin AUC=4 D1 + Gemcitabine 800 mg/m2 D1, 8 q21 Days:   A cycle is every 21 days:     Gemcitabine      Carboplatin   **Always confirm dose/schedule in your pharmacy ordering system**  Patient Characteristics: Recurrent or Progressive Disease, Second Line Therapy, Platinum Sensitive and ? 6 Months Since Last Therapy Therapeutic Status: Recurrent or Progressive Disease BRCA Mutation Status: Absent Line of Therapy: Second Line  Intent of Therapy: Curative Intent, Discussed with Patient

## 2018-05-03 NOTE — Progress Notes (Addendum)
[place] OFFICE PROGRESS NOTE   Diagnosis: Metastatic adenocarcinoma  INTERVAL HISTORY:   [name] returns as scheduled.  She overall is feeling better.  Pain has improved.  Bowels are moving.  Appetite is better.  No nausea or vomiting.  Objective:  Vital signs in last 24 hours:  Blood pressure 124/73, pulse 72, temperature 98.3 F (36.8 C), temperature source Oral, resp. rate 17, height 5\' 6"  (1.676 m), weight 139 lb 9.6 oz (63.3 kg), SpO2 100 %.     Resp: Lungs clear bilaterally. Cardio: Regular rate and rhythm. GI: Abdomen soft and nontender.  No hepatomegaly.  Healed surgical incision.  Mild erythema and edema over the pubic region. Vascular: No leg edema.  Lab Results:  Lab Results  Component Value Date   WBC 5.7 01/25/2018   HGB 12.6 01/25/2018   HCT 38.6 01/25/2018   MCV 98.2 01/25/2018   PLT 287 01/25/2018   NEUTROABS 3.6 01/25/2018    Imaging:  No results found.  Medications: I have reviewed the patient's current medications.  Assessment/Plan: 1. 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   Elevated CA 125.   Cycle 1 Taxol/carboplatin 08/16/2013.   Cycle 2 Taxol/carboplatin 09/06/2013.   Cycle 3 Taxol/carboplatin 09/27/2013   CT 10/11/2013 with a stable abdominal wall mass.  Status post resection of abdominal wall mass with abdominal wall reconstruction 11/05/2013. Final pathology showed adenocarcinoma. Specimen extensively involved with adenocarcinoma which focally involved the edge of the specimen. Immunostains and Foundation 1 testing were nonspecific for a primary tumor location.  Adjuvant radiation to the abdominal wall 01/16/2014 through 02/27/2014  CT 01/15/2018 while in the emergency room for evaluation of back pain-midline abdominal wall mass above the pubic symphysis and  adjacent to the bladder  Ultrasound-guided biopsy of the abdominal wall mass 01/25/2018-adenocarcinoma similar to the 2015 biopsy  PET scan 03/01/2018-hypermetabolism associated with a cystic and solid suprapubic mass. Focal uptake identified in the left L3-4 facets without underlying bony lesion. No other sites of unexpected or suspicious hypermetabolism in the neck, chest, abdomen or pelvis.  Exploratory laparotomy, resection of abdominal wall tumor, oversew of bladder peritoneum, cystoscopy 03/28/2018 (findings included a cystic and solid mass encountered below the lower edge of the incision extending into the space of retzius and adherent to the pubic arch inferiorly, bladder dome posteriorly and prior abdominal wall repair anteriorly.  No nodularity palpated in the tumor bed.  Visualized bowel and omentum free of disease.  Pelvic peritoneum appeared normal).  A "small "amount of tumor on the periosteum was cauterized using a Bovie.  Pathology on pelvic tumor-metastatic high-grade adenocarcinoma, favor clear-cell carcinoma of gynecologic origin, fragments up to 5.8 cm in size; ER negative, PR positive, 1+ (weak staining) to focally 2+, 30-40% 2. Ovarian cancer in 1988, low-grade adenocarcinoma with areas of "borderline" carcinoma, status post a hysterectomy and bilateral oophorectomy. 3. New onset atrial fibrillation August 2016, maintained on eliquis 4. Recurrent urinary tract infections   Disposition: [name] appears stable.  Dr. Alycia Rossetti recommends adjuvant chemotherapy with Taxol/carboplatin versus gemcitabine/carboplatin.  We reviewed potential toxicities associated with the chemotherapy including allergic reaction, arthralgias, bone marrow toxicity, hair loss, neuropathy.  We discussed the rash and fever associated with gemcitabine.  We also discussed the possibility of hormonal therapy.  Dr. Benay Spice will discuss further with Dr. Alycia Rossetti.  We have tentatively scheduled [name] to  begin gemcitabine/carboplatin 05/21/2018.  We will see her in follow-up that day.  Patient seen with Dr. Benay Spice.  25 minutes were spent face-to-face at today's visit with the majority that time involved in counseling/coordination of care.    Ned Card ANP/GNP-BC   05/03/2018  2:07 PM This was a shared visit with Ned Card.  We discussed "adjuvant "treatment options with [name] and her family.  We specifically discussed Taxol/carboplatin and gemcitabine/carboplatin chemotherapy.  We also discussed tamoxifen therapy.  We reviewed potential toxicities associated with tamoxifen and the above chemotherapy regimens.  We discussed the potential for an allergic reaction with a second course of carboplatin.  I will consult further with Dr. Alycia Rossetti.  The plan is to begin treatment 05/21/2018.  [name], MD

## 2018-05-08 ENCOUNTER — Telehealth: Payer: Self-pay | Admitting: *Deleted

## 2018-05-08 NOTE — Telephone Encounter (Signed)
Per Dr. Benay Spice, he has not been able to reach Dr. Alycia Rossetti. Will try again on 12/26.

## 2018-05-08 NOTE — Telephone Encounter (Addendum)
Patient called to inquire if Dr. Benay Spice and Dr. Alycia Rossetti had discussed her case yet and next steps in treatment?

## 2018-05-15 ENCOUNTER — Telehealth: Payer: Self-pay | Admitting: *Deleted

## 2018-05-15 NOTE — Telephone Encounter (Signed)
Dr. Benay Spice was able to discuss case with Dr. Alycia Rossetti. Dr. Alycia Rossetti suggests treating with Gemzar/Carboplatin. Patient notified and is aware to come on 05/21/18 at 11:15. Sent message to managed care to obtain authorization.

## 2018-05-17 ENCOUNTER — Other Ambulatory Visit: Payer: Self-pay | Admitting: Cardiology

## 2018-05-18 ENCOUNTER — Other Ambulatory Visit: Payer: Self-pay | Admitting: *Deleted

## 2018-05-18 DIAGNOSIS — C569 Malignant neoplasm of unspecified ovary: Secondary | ICD-10-CM

## 2018-05-19 ENCOUNTER — Other Ambulatory Visit: Payer: Self-pay | Admitting: Oncology

## 2018-05-21 ENCOUNTER — Inpatient Hospital Stay: Payer: Medicare Other

## 2018-05-21 ENCOUNTER — Other Ambulatory Visit: Payer: Self-pay | Admitting: *Deleted

## 2018-05-21 ENCOUNTER — Inpatient Hospital Stay: Payer: Medicare Other | Attending: Oncology | Admitting: Oncology

## 2018-05-21 VITALS — BP 120/61 | HR 65 | Temp 98.2°F | Resp 18 | Ht 66.0 in | Wt 138.2 lb

## 2018-05-21 DIAGNOSIS — Z87440 Personal history of urinary (tract) infections: Secondary | ICD-10-CM

## 2018-05-21 DIAGNOSIS — I4891 Unspecified atrial fibrillation: Secondary | ICD-10-CM

## 2018-05-21 DIAGNOSIS — C569 Malignant neoplasm of unspecified ovary: Secondary | ICD-10-CM | POA: Diagnosis not present

## 2018-05-21 DIAGNOSIS — Z90710 Acquired absence of both cervix and uterus: Secondary | ICD-10-CM

## 2018-05-21 DIAGNOSIS — Z5111 Encounter for antineoplastic chemotherapy: Secondary | ICD-10-CM

## 2018-05-21 DIAGNOSIS — Z90722 Acquired absence of ovaries, bilateral: Secondary | ICD-10-CM

## 2018-05-21 DIAGNOSIS — C44509 Unspecified malignant neoplasm of skin of other part of trunk: Secondary | ICD-10-CM

## 2018-05-21 LAB — CBC WITH DIFFERENTIAL (CANCER CENTER ONLY)
ABS IMMATURE GRANULOCYTES: 0.01 10*3/uL (ref 0.00–0.07)
BASOS ABS: 0 10*3/uL (ref 0.0–0.1)
BASOS PCT: 0 %
Eosinophils Absolute: 0.1 10*3/uL (ref 0.0–0.5)
Eosinophils Relative: 2 %
HCT: 35 % — ABNORMAL LOW (ref 36.0–46.0)
HEMOGLOBIN: 11.2 g/dL — AB (ref 12.0–15.0)
Immature Granulocytes: 0 %
LYMPHS PCT: 23 %
Lymphs Abs: 1.2 10*3/uL (ref 0.7–4.0)
MCH: 31.7 pg (ref 26.0–34.0)
MCHC: 32 g/dL (ref 30.0–36.0)
MCV: 99.2 fL (ref 80.0–100.0)
MONO ABS: 0.6 10*3/uL (ref 0.1–1.0)
Monocytes Relative: 12 %
NEUTROS ABS: 3.4 10*3/uL (ref 1.7–7.7)
NEUTROS PCT: 63 %
NRBC: 0 % (ref 0.0–0.2)
PLATELETS: 228 10*3/uL (ref 150–400)
RBC: 3.53 MIL/uL — AB (ref 3.87–5.11)
RDW: 12.6 % (ref 11.5–15.5)
WBC Count: 5.3 10*3/uL (ref 4.0–10.5)

## 2018-05-21 LAB — CMP (CANCER CENTER ONLY)
ALT: 14 U/L (ref 0–44)
ANION GAP: 7 (ref 5–15)
AST: 18 U/L (ref 15–41)
Albumin: 3.4 g/dL — ABNORMAL LOW (ref 3.5–5.0)
Alkaline Phosphatase: 53 U/L (ref 38–126)
BILIRUBIN TOTAL: 0.3 mg/dL (ref 0.3–1.2)
BUN: 13 mg/dL (ref 8–23)
CHLORIDE: 107 mmol/L (ref 98–111)
CO2: 28 mmol/L (ref 22–32)
Calcium: 10 mg/dL (ref 8.9–10.3)
Creatinine: 0.86 mg/dL (ref 0.44–1.00)
GFR, Estimated: >60 mL/min (ref >60)
Glucose, Bld: 80 mg/dL (ref 70–99)
Potassium: 5 mmol/L (ref 3.5–5.1)
Sodium: 142 mmol/L (ref 135–145)
TOTAL PROTEIN: 6.7 g/dL (ref 6.5–8.1)

## 2018-05-21 MED ORDER — SODIUM CHLORIDE 0.9 % IV SOLN
800.0000 mg/m2 | Freq: Once | INTRAVENOUS | Status: AC
  Administered 2018-05-21: 1368 mg via INTRAVENOUS
  Filled 2018-05-21: qty 35.98

## 2018-05-21 MED ORDER — PALONOSETRON HCL INJECTION 0.25 MG/5ML
INTRAVENOUS | Status: AC
  Filled 2018-05-21: qty 5

## 2018-05-21 MED ORDER — ONDANSETRON HCL 8 MG PO TABS: 8.0000 mg | ORAL_TABLET | Freq: Three times a day (TID) | ORAL | 0 refills | Status: DC | PRN

## 2018-05-21 MED ORDER — PALONOSETRON HCL INJECTION 0.25 MG/5ML
0.2500 mg | Freq: Once | INTRAVENOUS | Status: AC
  Administered 2018-05-21: 0.25 mg via INTRAVENOUS

## 2018-05-21 MED ORDER — PROCHLORPERAZINE MALEATE 5 MG PO TABS: 5.0000 mg | ORAL_TABLET | Freq: Four times a day (QID) | ORAL | 0 refills | Status: DC | PRN

## 2018-05-21 MED ORDER — DEXAMETHASONE SODIUM PHOSPHATE 10 MG/ML IJ SOLN
INTRAMUSCULAR | Status: AC
  Filled 2018-05-21: qty 1

## 2018-05-21 MED ORDER — FAMOTIDINE IN NACL 20-0.9 MG/50ML-% IV SOLN
INTRAVENOUS | Status: AC
  Filled 2018-05-21: qty 50

## 2018-05-21 MED ORDER — SODIUM CHLORIDE 0.9 % IV SOLN
Freq: Once | INTRAVENOUS | Status: AC
  Administered 2018-05-21: 13:00:00 via INTRAVENOUS
  Filled 2018-05-21: qty 250

## 2018-05-21 MED ORDER — FAMOTIDINE IN NACL 20-0.9 MG/50ML-% IV SOLN
20.0000 mg | Freq: Once | INTRAVENOUS | Status: AC
  Administered 2018-05-21: 20 mg via INTRAVENOUS

## 2018-05-21 MED ORDER — SODIUM CHLORIDE 0.9 % IV SOLN
306.4000 mg | Freq: Once | INTRAVENOUS | Status: AC
  Administered 2018-05-21: 310 mg via INTRAVENOUS
  Filled 2018-05-21: qty 31

## 2018-05-21 MED ORDER — DEXAMETHASONE SODIUM PHOSPHATE 10 MG/ML IJ SOLN
10.0000 mg | Freq: Once | INTRAMUSCULAR | Status: AC
  Administered 2018-05-21: 10 mg via INTRAVENOUS

## 2018-05-21 NOTE — Progress Notes (Signed)
MD reserving Emend IV as premed for now.  Will monitor. Kennith Center, Pharm.D., CPP 05/21/2018@12 :50 PM

## 2018-05-21 NOTE — Progress Notes (Addendum)
[place] OFFICE PROGRESS Note  Diagnosis: Ovarian cancer  INTERVAL HISTORY:   [name] returns as scheduled.  She feels well.  Mild soreness at the low abdominal surgical site.  No other complaint.  Objective:  Vital signs in last 24 hours:  Blood pressure 120/61, pulse 65, temperature 98.2 F (36.8 C), temperature source Oral, resp. rate 18, height _0  (1.676 m), weight 138 lb 3.2 oz (62.7 kg), SpO2 100 %.    Resp: Lungs clear bilaterally Cardio: Regular rate and rhythm GI: No hepatomegaly, mild skin thickening in the subcutaneous tissue surrounding the low abdominal scar.  No mass. Vascular: No leg edema   Lab Results:  Lab Results  Component Value Date   WBC 5.3 05/21/2018   HGB 11.2 (L) 05/21/2018   HCT 35.0 (L) 05/21/2018   MCV 99.2 05/21/2018   PLT 228 05/21/2018   NEUTROABS 3.4 05/21/2018    CMP  Lab Results  Component Value Date   NA 142 05/21/2018   K 5.0 05/21/2018   CL 107 05/21/2018   CO2 28 05/21/2018   GLUCOSE 80 05/21/2018   BUN 13 05/21/2018   CREATININE 0.86 05/21/2018   CALCIUM 10.0 05/21/2018   PROT 6.7 05/21/2018   ALBUMIN 3.4 (L) 05/21/2018   AST 18 05/21/2018   ALT 14 05/21/2018   ALKPHOS 53 05/21/2018   BILITOT 0.3 05/21/2018   GFRNONAA >60 05/21/2018   GFRAA >60 05/21/2018    Medications: I have reviewed the patient's current medications.   Assessment/Plan: 1. 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   Elevated CA 125.   Cycle 1 Taxol/carboplatin 08/16/2013.   Cycle 2 Taxol/carboplatin 09/06/2013.   Cycle 3 Taxol/carboplatin 09/27/2013   CT 10/11/2013 with a stable abdominal wall mass.  Status post resection of abdominal wall mass with abdominal wall reconstruction 11/05/2013. Final pathology showed adenocarcinoma. Specimen extensively involved with  adenocarcinoma which focally involved the edge of the specimen. Immunostains and Foundation 1 testing were nonspecific for a primary tumor location.  Adjuvant radiation to the abdominal wall 01/16/2014 through 02/27/2014  CT 01/15/2018 while in the emergency room for evaluation of back pain-midline abdominal wall mass above the pubic symphysis and adjacent to the bladder  Ultrasound-guided biopsy of the abdominal wall mass 01/25/2018-adenocarcinoma similar to the 2015 biopsy  PET scan 03/01/2018-hypermetabolism associated with a cystic and solid suprapubic mass. Focal uptake identified in the left L3-4 facets without underlying bony lesion. No other sites of unexpected or suspicious hypermetabolism in the neck, chest, abdomen or pelvis.  Exploratory laparotomy, resection of abdominal wall tumor, oversew of bladder peritoneum, cystoscopy 03/28/2018 (findings included a cystic and solid mass encountered below the lower edge of the incision extending into the space of retzius and adherent to the pubic arch inferiorly, bladder dome posteriorly and prior abdominal wall repair anteriorly.  No nodularity palpated in the tumor bed.  Visualized bowel and omentum free of disease.  Pelvic peritoneum appeared normal).  A "small "amount of tumor on the periosteum was cauterized using a Bovie.  Pathology on pelvic tumor-metastatic high-grade adenocarcinoma, favor clear-cell carcinoma of gynecologic origin, fragments up to 5.8 cm in size; ER negative, PR positive, 1+ (weak staining) to focally 2+, 30-40%, MSI-stable, mutation burden-4,CREBBP alteration 2. Ovarian cancer in 1988, low-grade adenocarcinoma with areas of "borderline" carcinoma, status post a hysterectomy and bilateral oophorectomy. 3. New onset  atrial fibrillation August 2016, maintained on eliquis 4. Recurrent urinary tract infections  Disposition: Ms. Clevinger appears well.  I discussed the case with Dr. Alycia Rossetti.  She recommends proceeding with  gemcitabine/carboplatin chemotherapy.  I reviewed potential toxicities associated with this regimen again today with Ms. Monical and her family.  She agrees to proceed.  We discussed the potential for an allergic reaction with carboplatin. The plan is to begin cycle 1 gemcitabine/carboplatin today.  She will return for day 8 chemotherapy in 1 week.  She is scheduled for an office visit and cycle 2 chemotherapy 06/11/2018.  [name], MD  05/21/2018  1:47 PM

## 2018-05-21 NOTE — Patient Instructions (Addendum)
[facility] Discharge Instructions for Patients Receiving Chemotherapy  Today you received the following chemotherapy agents Gemzar and Carboplatin.   To help prevent nausea and vomiting after your treatment, we encourage you to take your nausea medication as directed.  If you develop nausea and vomiting that is not controlled by your nausea medication, call the clinic.   BELOW ARE SYMPTOMS THAT SHOULD BE REPORTED IMMEDIATELY:  *FEVER GREATER THAN 100.5 F  *CHILLS WITH OR WITHOUT FEVER  NAUSEA AND VOMITING THAT IS NOT CONTROLLED WITH YOUR NAUSEA MEDICATION  *UNUSUAL SHORTNESS OF BREATH  *UNUSUAL BRUISING OR BLEEDING  TENDERNESS IN MOUTH AND THROAT WITH OR WITHOUT PRESENCE OF ULCERS  *URINARY PROBLEMS  *BOWEL PROBLEMS  UNUSUAL RASH Items with * indicate a potential emergency and should be followed up as soon as possible.  Feel free to call the clinic should you have any questions or concerns. The clinic phone number is [phone].  Please show the [facility] at check-in to the Emergency Department and triage nurse.  Gemcitabine injection What is this medicine? GEMCITABINE (jem SYE ta been) is a chemotherapy drug. This medicine is used to treat many types of cancer like breast cancer, lung cancer, pancreatic cancer, and ovarian cancer. This medicine may be used for other purposes; ask your health care provider or pharmacist if you have questions. COMMON BRAND NAME(S): Gemzar, Infugem What should I tell my health care provider before I take this medicine? They need to know if you have any of these conditions: -blood disorders -infection -kidney disease -liver disease -lung or breathing disease, like asthma -recent or ongoing radiation therapy -an unusual or allergic reaction to gemcitabine, other chemotherapy, other medicines, foods, dyes, or preservatives -pregnant or trying to get pregnant -breast-feeding How should I use this medicine? This  drug is given as an infusion into a vein. It is administered in a hospital or clinic by a specially trained health care professional. Talk to your pediatrician regarding the use of this medicine in children. Special care may be needed. Overdosage: If you think you have taken too much of this medicine contact a poison control center or emergency room at once. NOTE: This medicine is only for you. Do not share this medicine with others. What if I miss a dose? It is important not to miss your dose. Call your doctor or health care professional if you are unable to keep an appointment. What may interact with this medicine? -medicines to increase blood counts like filgrastim, pegfilgrastim, sargramostim -some other chemotherapy drugs like cisplatin -vaccines Talk to your doctor or health care professional before taking any of these medicines: -acetaminophen -aspirin -ibuprofen -ketoprofen -naproxen This list may not describe all possible interactions. Give your health care provider a list of all the medicines, herbs, non-prescription drugs, or dietary supplements you use. Also tell them if you smoke, drink alcohol, or use illegal drugs. Some items may interact with your medicine. What should I watch for while using this medicine? Visit your doctor for checks on your progress. This drug may make you feel generally unwell. This is not uncommon, as chemotherapy can affect healthy cells as well as cancer cells. Report any side effects. Continue your course of treatment even though you feel ill unless your doctor tells you to stop. In some cases, you may be given additional medicines to help with side effects. Follow all directions for their use. Call your doctor or health care professional for advice if you get a fever, chills or  sore throat, or other symptoms of a cold or flu. Do not treat yourself. This drug decreases your body's ability to fight infections. Try to avoid being around people who are  sick. This medicine may increase your risk to bruise or bleed. Call your doctor or health care professional if you notice any unusual bleeding. Be careful brushing and flossing your teeth or using a toothpick because you may get an infection or bleed more easily. If you have any dental work done, tell your dentist you are receiving this medicine. Avoid taking products that contain aspirin, acetaminophen, ibuprofen, naproxen, or ketoprofen unless instructed by your doctor. These medicines may hide a fever. Do not become pregnant while taking this medicine or for 6 months after stopping it. Women should inform their doctor if they wish to become pregnant or think they might be pregnant. Men should not father a child while taking this medicine and for 3 months after stopping it. There is a potential for serious side effects to an unborn child. Talk to your health care professional or pharmacist for more information. Do not breast-feed an infant while taking this medicine or for at least 1 week after stopping it. Men should inform their doctors if they wish to father a child. This medicine may lower sperm counts. Talk with your doctor or health care professional if you are concerned about your fertility. What side effects may I notice from receiving this medicine? Side effects that you should report to your doctor or health care professional as soon as possible: -allergic reactions like skin rash, itching or hives, swelling of the face, lips, or tongue -breathing problems -pain, redness, or irritation at site where injected -signs and symptoms of a dangerous change in heartbeat or heart rhythm like chest pain; dizziness; fast or irregular heartbeat; palpitations; feeling faint or lightheaded, falls; breathing problems -signs of decreased platelets or bleeding - bruising, pinpoint red spots on the skin, black, tarry stools, blood in the urine -signs of decreased red blood cells - unusually weak or tired,  feeling faint or lightheaded, falls -signs of infection - fever or chills, cough, sore throat, pain or difficulty passing urine -signs and symptoms of kidney injury like trouble passing urine or change in the amount of urine -signs and symptoms of liver injury like dark yellow or brown urine; general ill feeling or flu-like symptoms; light-colored stools; loss of appetite; nausea; right upper belly pain; unusually weak or tired; yellowing of the eyes or skin -swelling of ankles, feet, hands Side effects that usually do not require medical attention (report to your doctor or health care professional if they continue or are bothersome): -constipation -diarrhea -hair loss -loss of appetite -nausea -rash -vomiting This list may not describe all possible side effects. Call your doctor for medical advice about side effects. You may report side effects to FDA at 1-800-FDA-1088. Where should I keep my medicine? This drug is given in a hospital or clinic and will not be stored at home. NOTE: This sheet is a summary. It may not cover all possible information. If you have questions about this medicine, talk to your doctor, pharmacist, or health care provider.  2019 Elsevier/Gold Standard (2017-07-26 18:06:11)  Carboplatin injection What is this medicine? CARBOPLATIN (KAR boe pla tin) is a chemotherapy drug. It targets fast dividing cells, like cancer cells, and causes these cells to die. This medicine is used to treat ovarian cancer and many other cancers. This medicine may be used for other purposes; ask your health  care provider or pharmacist if you have questions. COMMON BRAND NAME(S): Paraplatin What should I tell my health care provider before I take this medicine? They need to know if you have any of these conditions: -blood disorders -hearing problems -kidney disease -recent or ongoing radiation therapy -an unusual or allergic reaction to carboplatin, cisplatin, other chemotherapy, other  medicines, foods, dyes, or preservatives -pregnant or trying to get pregnant -breast-feeding How should I use this medicine? This drug is usually given as an infusion into a vein. It is administered in a hospital or clinic by a specially trained health care professional. Talk to your pediatrician regarding the use of this medicine in children. Special care may be needed. Overdosage: If you think you have taken too much of this medicine contact a poison control center or emergency room at once. NOTE: This medicine is only for you. Do not share this medicine with others. What if I miss a dose? It is important not to miss a dose. Call your doctor or health care professional if you are unable to keep an appointment. What may interact with this medicine? -medicines for seizures -medicines to increase blood counts like filgrastim, pegfilgrastim, sargramostim -some antibiotics like amikacin, gentamicin, neomycin, streptomycin, tobramycin -vaccines Talk to your doctor or health care professional before taking any of these medicines: -acetaminophen -aspirin -ibuprofen -ketoprofen -naproxen This list may not describe all possible interactions. Give your health care provider a list of all the medicines, herbs, non-prescription drugs, or dietary supplements you use. Also tell them if you smoke, drink alcohol, or use illegal drugs. Some items may interact with your medicine. What should I watch for while using this medicine? Your condition will be monitored carefully while you are receiving this medicine. You will need important blood work done while you are taking this medicine. This drug may make you feel generally unwell. This is not uncommon, as chemotherapy can affect healthy cells as well as cancer cells. Report any side effects. Continue your course of treatment even though you feel ill unless your doctor tells you to stop. In some cases, you may be given additional medicines to help with side  effects. Follow all directions for their use. Call your doctor or health care professional for advice if you get a fever, chills or sore throat, or other symptoms of a cold or flu. Do not treat yourself. This drug decreases your body's ability to fight infections. Try to avoid being around people who are sick. This medicine may increase your risk to bruise or bleed. Call your doctor or health care professional if you notice any unusual bleeding. Be careful brushing and flossing your teeth or using a toothpick because you may get an infection or bleed more easily. If you have any dental work done, tell your dentist you are receiving this medicine. Avoid taking products that contain aspirin, acetaminophen, ibuprofen, naproxen, or ketoprofen unless instructed by your doctor. These medicines may hide a fever. Do not become pregnant while taking this medicine. Women should inform their doctor if they wish to become pregnant or think they might be pregnant. There is a potential for serious side effects to an unborn child. Talk to your health care professional or pharmacist for more information. Do not breast-feed an infant while taking this medicine. What side effects may I notice from receiving this medicine? Side effects that you should report to your doctor or health care professional as soon as possible: -allergic reactions like skin rash, itching or hives, swelling of the  face, lips, or tongue -signs of infection - fever or chills, cough, sore throat, pain or difficulty passing urine -signs of decreased platelets or bleeding - bruising, pinpoint red spots on the skin, black, tarry stools, nosebleeds -signs of decreased red blood cells - unusually weak or tired, fainting spells, lightheadedness -breathing problems -changes in hearing -changes in vision -chest pain -high blood pressure -low blood counts - This drug may decrease the number of white blood cells, red blood cells and platelets. You may be  at increased risk for infections and bleeding. -nausea and vomiting -pain, swelling, redness or irritation at the injection site -pain, tingling, numbness in the hands or feet -problems with balance, talking, walking -trouble passing urine or change in the amount of urine Side effects that usually do not require medical attention (report to your doctor or health care professional if they continue or are bothersome): -hair loss -loss of appetite -metallic taste in the mouth or changes in taste This list may not describe all possible side effects. Call your doctor for medical advice about side effects. You may report side effects to FDA at 1-800-FDA-1088. Where should I keep my medicine? This drug is given in a hospital or clinic and will not be stored at home. NOTE: This sheet is a summary. It may not cover all possible information. If you have questions about this medicine, talk to your doctor, pharmacist, or health care provider.  2019 Elsevier/Gold Standard (2007-08-07 14:38:05)

## 2018-05-28 ENCOUNTER — Inpatient Hospital Stay: Payer: Medicare Other

## 2018-05-28 VITALS — BP 136/59 | HR 58 | Temp 97.7°F | Resp 18

## 2018-05-28 DIAGNOSIS — Z90722 Acquired absence of ovaries, bilateral: Secondary | ICD-10-CM | POA: Diagnosis not present

## 2018-05-28 DIAGNOSIS — C569 Malignant neoplasm of unspecified ovary: Secondary | ICD-10-CM | POA: Diagnosis not present

## 2018-05-28 DIAGNOSIS — I4891 Unspecified atrial fibrillation: Secondary | ICD-10-CM | POA: Diagnosis not present

## 2018-05-28 DIAGNOSIS — Z87440 Personal history of urinary (tract) infections: Secondary | ICD-10-CM | POA: Diagnosis not present

## 2018-05-28 DIAGNOSIS — Z5111 Encounter for antineoplastic chemotherapy: Secondary | ICD-10-CM | POA: Diagnosis not present

## 2018-05-28 DIAGNOSIS — C44509 Unspecified malignant neoplasm of skin of other part of trunk: Secondary | ICD-10-CM

## 2018-05-28 DIAGNOSIS — Z90710 Acquired absence of both cervix and uterus: Secondary | ICD-10-CM | POA: Diagnosis not present

## 2018-05-28 LAB — CBC WITH DIFFERENTIAL (CANCER CENTER ONLY)
Abs Immature Granulocytes: 0 10*3/uL (ref 0.00–0.07)
BASOS ABS: 0 10*3/uL (ref 0.0–0.1)
Basophils Relative: 1 %
EOS PCT: 2 %
Eosinophils Absolute: 0.1 10*3/uL (ref 0.0–0.5)
HEMATOCRIT: 36.2 % (ref 36.0–46.0)
Hemoglobin: 11.6 g/dL — ABNORMAL LOW (ref 12.0–15.0)
Immature Granulocytes: 0 %
Lymphocytes Relative: 54 %
Lymphs Abs: 1.3 10*3/uL (ref 0.7–4.0)
MCH: 31.5 pg (ref 26.0–34.0)
MCHC: 32 g/dL (ref 30.0–36.0)
MCV: 98.4 fL (ref 80.0–100.0)
Monocytes Absolute: 0.1 10*3/uL (ref 0.1–1.0)
Monocytes Relative: 6 %
Neutro Abs: 0.9 10*3/uL — ABNORMAL LOW (ref 1.7–7.7)
Neutrophils Relative %: 37 %
Platelet Count: 149 10*3/uL — ABNORMAL LOW (ref 150–400)
RBC: 3.68 MIL/uL — ABNORMAL LOW (ref 3.87–5.11)
RDW: 12 % (ref 11.5–15.5)
WBC Count: 2.4 10*3/uL — ABNORMAL LOW (ref 4.0–10.5)
nRBC: 0 % (ref 0.0–0.2)

## 2018-05-28 MED ORDER — SODIUM CHLORIDE 0.9 % IV SOLN
Freq: Once | INTRAVENOUS | Status: DC
  Filled 2018-05-28: qty 250

## 2018-05-28 MED ORDER — PROCHLORPERAZINE MALEATE 10 MG PO TABS: 10.0000 mg | ORAL_TABLET | Freq: Once | ORAL | Status: DC

## 2018-05-28 NOTE — Patient Instructions (Signed)
Neutropenia Neutropenia is a condition that occurs when you have a lower-than-normal level of a type of white blood cell (neutrophil) in your body. Neutrophils are made in the spongy center of large bones (bone marrow) and they fight infections. Neutrophils are your body's main defense against bacterial and fungal infections. The fewer neutrophils you have and the longer your body remains without them, the greater your risk of getting a severe infection. What are the causes? This condition can occur if your body uses up or destroys neutrophils faster than your bone marrow can make them. This problem may happen because of:  Bacterial or fungal infection.  Allergic disorders.  Reactions to some medicines.  Autoimmune disease.  An enlarged spleen. This condition can also occur if your bone marrow does not produce enough neutrophils. This problem may be caused by:  Cancer.  Cancer treatments, such as radiation or chemotherapy.  Viral infections.  Medicines, such as phenytoin.  Vitamin B12 deficiency.  Diseases of the bone marrow.  Environmental toxins, such as insecticides. What are the signs or symptoms? This condition does not usually cause symptoms. If symptoms are present, they are usually caused by an underlying infection. Symptoms of an infection may include:  Fever.  Chills.  Swollen glands.  Oral or anal ulcers.  Cough and shortness of breath.  Rash.  Skin infection.  Fatigue. How is this diagnosed? Your health care provider may suspect neutropenia if you have:  A condition that may cause neutropenia.  Symptoms of infection, especially fever.  Frequent and unusual infections. You will have a medical history and physical exam. Tests will also be done, such as:  A complete blood count (CBC).  A procedure to collect a sample of bone marrow for examination (bone marrow biopsy).  A chest X-ray.  A urine culture.  A blood culture. How is this  treated? Treatment depends on the underlying cause and severity of your condition. Mild neutropenia may not require treatment. Treatment may include medicines, such as:  Antibiotic medicine given through an IV tube.  Antiviral medicines.  Antifungal medicines.  A medicine to increase neutrophil production (colony-stimulating factor). You may get this drug through an IV tube or by injection.  Steroids given through an IV tube. If an underlying condition is causing neutropenia, you may need treatment for that condition. If medicines you are taking are causing neutropenia, your health care provider may have you stop taking those medicines. Follow these instructions at home: Medicines   Take over-the-counter and prescription medicines only as told by your health care provider.  Get a seasonal flu shot (influenza vaccine). Lifestyle  Do not eat unpasteurized foods.Do not eat unwashed raw fruits or vegetables.  Avoid exposure to groups of people or children.  Avoid being around people who are sick.  Avoid being around dirt or dust, such as in construction areas or gardens.  Do not provide direct care for pets. Avoid animal droppings. Do not clean litter boxes and bird cages. Hygiene   Bathe daily.  Clean the area between the genitals and the anus (perineal area) after you urinate or have a bowel movement. If you are female, wipe from front to back.  Brush your teeth with a soft toothbrush before and after meals.  Do not use a razor that has a blade. Use an electric razor to remove hair.  Wash your hands often. Make sure others who come in contact with you also wash their hands. If soap and water are not available, use hand   sanitizer. General instructions  Do not have sex unless your health care provider has approved.  Take actions to avoid cuts and burns. For example: ? Be cautious when you use knives. Always cut away from yourself. ? Keep knives in protective sheaths or  guards when not in use. ? Use oven mitts when you cook with a hot stove, oven, or grill. ? Stand a safe distance away from open fires.  Avoid people who received a vaccine in the past 30 days if that vaccine contained a live version of the germ (live vaccine). You should not get a live vaccine. Common live vaccines are varicella, measles, mumps, and rubella.  Do not share food utensils.  Do not use tampons, enemas, or rectal suppositories unless your health care provider has approved.  Keep all appointments as told by your health care provider. This is important. Contact a health care provider if:  You have a fever.  You have chills or you start to shake.  You have: ? A sore throat. ? A warm, red, or tender area on your skin. ? A cough. ? Frequent or painful urination. ? Vaginal discharge or itching.  You develop: ? Sores in your mouth or anus. ? Swollen lymph nodes. ? Red streaks on the skin. ? A rash.  You feel: ? Nauseous or you vomit. ? Very fatigued. ? Short of breath. This information is not intended to replace advice given to you by your health care provider. Make sure you discuss any questions you have with your health care provider. Document Released: 10/22/2001 Document Revised: 12/27/2017 Document Reviewed: 11/12/2014 Elsevier Interactive Patient Education  2019 [employer].

## 2018-05-28 NOTE — Progress Notes (Unsigned)
Treatment cycle 1 day 8 gemzar cancelled due to WBC.

## 2018-06-01 ENCOUNTER — Telehealth: Payer: Self-pay | Admitting: *Deleted

## 2018-06-01 NOTE — Telephone Encounter (Signed)
Left VM that she was having burning with urination.

## 2018-06-01 NOTE — Telephone Encounter (Signed)
Patient called back to report her urinary symptoms are better today. She will continue to push fluids and call if they return.

## 2018-06-01 NOTE — Telephone Encounter (Signed)
Attempted to return call at 1230--left VM. No answer on her mobile #.

## 2018-06-02 ENCOUNTER — Ambulatory Visit (HOSPITAL_COMMUNITY)
Admission: EM | Admit: 2018-06-02 | Discharge: 2018-06-02 | Disposition: A | Discharge status: Home / Self Care | Payer: Medicare Other | Attending: Family Medicine | Admitting: Family Medicine

## 2018-06-02 ENCOUNTER — Other Ambulatory Visit: Payer: Self-pay

## 2018-06-02 ENCOUNTER — Encounter (HOSPITAL_COMMUNITY): Payer: Self-pay

## 2018-06-02 DIAGNOSIS — Z8249 Family history of ischemic heart disease and other diseases of the circulatory system: Secondary | ICD-10-CM | POA: Diagnosis not present

## 2018-06-02 DIAGNOSIS — I4891 Unspecified atrial fibrillation: Secondary | ICD-10-CM | POA: Diagnosis not present

## 2018-06-02 DIAGNOSIS — Z7901 Long term (current) use of anticoagulants: Secondary | ICD-10-CM | POA: Diagnosis not present

## 2018-06-02 DIAGNOSIS — R300 Dysuria: Secondary | ICD-10-CM

## 2018-06-02 DIAGNOSIS — Z87440 Personal history of urinary (tract) infections: Secondary | ICD-10-CM | POA: Diagnosis not present

## 2018-06-02 DIAGNOSIS — F419 Anxiety disorder, unspecified: Secondary | ICD-10-CM | POA: Insufficient documentation

## 2018-06-02 DIAGNOSIS — Z79899 Other long term (current) drug therapy: Secondary | ICD-10-CM | POA: Insufficient documentation

## 2018-06-02 DIAGNOSIS — E7800 Pure hypercholesterolemia, unspecified: Secondary | ICD-10-CM | POA: Insufficient documentation

## 2018-06-02 DIAGNOSIS — E039 Hypothyroidism, unspecified: Secondary | ICD-10-CM | POA: Diagnosis not present

## 2018-06-02 DIAGNOSIS — R3915 Urgency of urination: Secondary | ICD-10-CM

## 2018-06-02 DIAGNOSIS — N390 Urinary tract infection, site not specified: Secondary | ICD-10-CM

## 2018-06-02 DIAGNOSIS — K219 Gastro-esophageal reflux disease without esophagitis: Secondary | ICD-10-CM | POA: Insufficient documentation

## 2018-06-02 DIAGNOSIS — I10 Essential (primary) hypertension: Secondary | ICD-10-CM | POA: Insufficient documentation

## 2018-06-02 DIAGNOSIS — R350 Frequency of micturition: Secondary | ICD-10-CM | POA: Diagnosis not present

## 2018-06-02 MED ORDER — SULFAMETHOXAZOLE-TRIMETHOPRIM 800-160 MG PO TABS: 1.0000 | ORAL_TABLET | Freq: Two times a day (BID) | ORAL | 0 refills | Status: AC

## 2018-06-02 MED ORDER — CEPHALEXIN 500 MG PO CAPS: 500.0000 mg | ORAL_CAPSULE | Freq: Two times a day (BID) | ORAL | 0 refills | Status: DC

## 2018-06-02 NOTE — ED Notes (Signed)
Urinalysis testing exceeded the limitation of clinitek capabilities. Reported values to provider

## 2018-06-02 NOTE — ED Triage Notes (Signed)
Pt presents today with urinary urgency, frequency, and dysuria for 2 days. No abdominal pain but does have pressure when trying to urinate.

## 2018-06-02 NOTE — Discharge Instructions (Addendum)
Your urine was positive for infection We will send some antibiotics to the pharmacy.  Make sure that you complete the full course.  Stay hydrated and drink fluids.  Follow up as needed for continued or worsening symptoms

## 2018-06-02 NOTE — ED Provider Notes (Signed)
[place]    CSN: [id number] Arrival date & time: 06/02/18  1458     History   Chief Complaint Chief Complaint  Patient presents with  . Dysuria    HPI [name] is a 73 y.o. female.   Patient is a 73 year old female presents with urinary urgency, frequency and dysuria for 2 days.  She is having some suprapubic pressure but denies any significant abdominal pain, back pain, fevers, nausea, vomiting. Her symptoms have been constant and remain the same.  She is not taking anything for her symptoms.  She does have recurrent UTIs which is usually treated with Bactrim.  Patient is currently on chemo  ROS per HPI      Past Medical History:  Diagnosis Date  . Anxiety    new dx  . 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 GERD (gastroesophageal reflux disease)    hx of years ago  . Hx of radiation therapy 01/16/14-02/27/14   abdomen   . Hypercholesteremia    under control with diet and fish oil  . Hypertension   . Ovarian cyst   . PONV (postoperative nausea and vomiting)   . Restless leg syndrome   . Thyroid disease     Patient Active Problem List   Diagnosis Date Noted  . 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 Atrial fibrillation ([place]) 01/13/2015  . Chest pain 01/13/2015  . Ovarian ca ([place]) 11/05/2013  . 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 Neoplasm of abdominal wall of uncertain behavior 45/80/9983  . Hypothyroidism     Past Surgical History:  Procedure Laterality Date  . APPENDECTOMY    . APPLICATION OF A-CELL OF CHEST/ABDOMEN N/A 11/05/2013   Procedure: ABDOMINAL WALL RESCONTRUCTION WITH STRATUS;  Surgeon: Irene Limbo, MD;  Location: [facility];  Service: Plastics;  Laterality: N/A;  . CHOLECYSTECTOMY  2009  . LAPAROTOMY N/A 11/05/2013   Procedure: RESECTION OF PELVIC MASS;  Surgeon: Imagene Gurney A. Alycia Rossetti, MD;  Location: [facility];  Service: Gynecology;  Laterality: N/A;  . OOPHORECTOMY     BSO    . TONSILLECTOMY  as child  . TOTAL ABDOMINAL HYSTERECTOMY  1988   ovarian cyst  BSO    OB History    Gravida  0   Para      Term      Preterm      AB      Living        SAB      TAB      Ectopic      Multiple      Live Births               Home Medications    Prior to Admission medications   Medication Sig Start Date End Date Taking? Authorizing Provider  ALPRAZolam Duanne Moron) 0.25 MG tablet Take 0.25 mg by mouth at bedtime as needed for sleep.  08/27/14  Yes [provider]  Calcium Carb-Cholecalciferol (CALCIUM 600 + D PO) Take 1 tablet by mouth daily.   Yes [provider]  Cholecalciferol (EQL VITAMIN D3) 1000 UNITS tablet Take 1,000 Units by mouth daily.   Yes [provider]  ELIQUIS 5 MG TABS tablet TAKE (1) TABLET BY MOUTH TWICE DAILY. 05/17/18  Yes Branch, Alphonse Guild, MD  HYDROcodone-acetaminophen (NORCO) 5-325 MG tablet Take 1 tablet by mouth every 8 (eight) hours as needed for moderate pain. 05/03/18  Yes Owens Shark, NP  levothyroxine (SYNTHROID, LEVOTHROID) 75 MCG tablet Take 75 mcg by mouth every morning.    Yes [provider]  metoprolol tartrate (LOPRESSOR) 25 MG tablet TAKE (1/2) TABLET BY MOUTH TWICE DAILY. Patient taking differently: Take 12.5 mg by mouth 2 (two) times daily. TAKE (1/2) TABLET BY MOUTH TWICE DAILY. 01/12/18  Yes BranchAlphonse Guild, MD  Multiple Vitamins-Minerals (MULTIVITAMIN WITH MINERALS) tablet Take 1 tablet by mouth daily.    Yes [provider]  Omega-3 Fatty Acids (FISH OIL) 1200 MG CAPS Take 1,200 mg by mouth daily.    Yes [provider]  rOPINIRole (REQUIP) 0.25 MG tablet Take 0.25-0.75 mg by mouth See admin instructions. Pt takes 1 tablet nightly but can take up to 3 if needed for restless leg 12/04/17  Yes [provider]  ondansetron (ZOFRAN) 8 MG tablet Take 1 tablet (8 mg total) by mouth every 8 (eight) hours as needed for nausea or vomiting. Beginning 72  hours after IV Aloxi given 05/21/18   Ladell Pier, MD  prochlorperazine (COMPAZINE) 5 MG tablet Take 1-2 tablets (5-10 mg total) by mouth every 6 (six) hours as needed for nausea or vomiting. 05/21/18   Ladell Pier, MD  sulfamethoxazole-trimethoprim (BACTRIM DS,SEPTRA DS) 800-160 MG tablet Take 1 tablet by mouth 2 (two) times daily for 7 days. 06/02/18 06/09/18  Orvan July, NP    Family History Family History  Problem Relation Age of Onset  . Heart disease Mother   . Heart disease Sister   . Diabetes Sister   . Cancer Sister        melanoma-skin cancer  . Breast cancer Paternal Grandmother        Age 9's  . Cancer Paternal Grandmother        breast  . Cancer Paternal Grandfather        prostate/bladder    Social History Social History   Tobacco Use  . Smoking status: Never Smoker  . Smokeless tobacco: Never Used  Substance Use Topics  . Alcohol use: No    Alcohol/week: 0.0 standard drinks  . Drug use: No     Allergies   Patient has no known allergies.   Review of Systems Review of Systems   Physical Exam Triage Vital Signs ED Triage Vitals  Enc Vitals Group     BP      Pulse      Resp      Temp      Temp src      SpO2      Weight      Height      Head Circumference      Peak Flow      Pain Score      Pain Loc      Pain Edu?      Excl. in [place]?    No data found.  Updated Vital Signs BP 123/78   Pulse 70   Temp 98 F (36.7 C) (Oral)   Resp 14   SpO2 100%   Visual Acuity Right Eye Distance:   Left Eye Distance:   Bilateral Distance:    Right Eye Near:   Left Eye Near:    Bilateral Near:     Physical Exam Vitals signs and nursing note reviewed.  Constitutional:      General: She is not in acute distress.    Appearance: She is well-developed.  HENT:     Head: Normocephalic and atraumatic.  Eyes:  Conjunctiva/sclera: Conjunctivae normal.  Neck:     Musculoskeletal: Neck supple.  Cardiovascular:     Rate and Rhythm: Normal  rate and regular rhythm.     Heart sounds: No murmur.  Pulmonary:     Effort: Pulmonary effort is normal. No respiratory distress.     Breath sounds: Normal breath sounds.  Abdominal:     Palpations: Abdomen is soft.     Tenderness: There is no abdominal tenderness.  Skin:    General: Skin is warm and dry.  Neurological:     Mental Status: She is alert.      UC Treatments / Results  Labs (all labs ordered are listed, but only abnormal results are displayed) Labs Reviewed  URINE CULTURE    EKG None  Radiology No results found.  Procedures Procedures (including critical care time)  Medications Ordered in UC Medications - No data to display  Initial Impression / Assessment and Plan / UC Course  I have reviewed the triage vital signs and the nursing notes.  Pertinent labs & imaging results that were available during my care of the patient were reviewed by me and considered in my medical decision making (see chart for details).    Urine revealed  Large leuks Negative nitrites Negative protein pH 6.0 Large blood Specific gravity 1 Negative ketones Negative bili Negative glucose  We will go ahead and treat for urinary tract infection Bactrim 2 times a day for 7 days Increase water intake Follow up as needed for continued or worsening symptoms  Final Clinical Impressions(s) / UC Diagnoses   Final diagnoses:  None     Discharge Instructions     Your urine was positive for infection We will send some antibiotics to the pharmacy.  Make sure that you complete the full course.  Stay hydrated and drink fluids.  Follow up as needed for continued or worsening symptoms     ED Prescriptions    Medication Sig Dispense Auth. Provider   cephALEXin (KEFLEX) 500 MG capsule  (Status: Discontinued) Take 1 capsule (500 mg total) by mouth 2 (two) times daily. 28 capsule Anaiya Wisinski A, NP   sulfamethoxazole-trimethoprim (BACTRIM DS,SEPTRA DS) 800-160 MG tablet Take 1  tablet by mouth 2 (two) times daily for 7 days. 14 tablet Loura Halt A, NP     Controlled Substance Prescriptions [place] Controlled Substance Registry consulted? Not Applicable   Orvan July, NP 06/02/18 1656

## 2018-06-03 ENCOUNTER — Other Ambulatory Visit: Payer: Self-pay | Admitting: Oncology

## 2018-06-04 ENCOUNTER — Telehealth (HOSPITAL_COMMUNITY): Payer: Self-pay | Admitting: Emergency Medicine

## 2018-06-04 ENCOUNTER — Other Ambulatory Visit: Payer: Medicare Other

## 2018-06-04 ENCOUNTER — Inpatient Hospital Stay: Payer: Medicare Other

## 2018-06-04 ENCOUNTER — Telehealth: Payer: Self-pay | Admitting: *Deleted

## 2018-06-04 DIAGNOSIS — Z90710 Acquired absence of both cervix and uterus: Secondary | ICD-10-CM | POA: Diagnosis not present

## 2018-06-04 DIAGNOSIS — C569 Malignant neoplasm of unspecified ovary: Secondary | ICD-10-CM | POA: Diagnosis not present

## 2018-06-04 DIAGNOSIS — I4891 Unspecified atrial fibrillation: Secondary | ICD-10-CM | POA: Diagnosis not present

## 2018-06-04 DIAGNOSIS — Z5111 Encounter for antineoplastic chemotherapy: Secondary | ICD-10-CM | POA: Diagnosis not present

## 2018-06-04 DIAGNOSIS — Z87440 Personal history of urinary (tract) infections: Secondary | ICD-10-CM | POA: Diagnosis not present

## 2018-06-04 DIAGNOSIS — Z90722 Acquired absence of ovaries, bilateral: Secondary | ICD-10-CM | POA: Diagnosis not present

## 2018-06-04 LAB — CMP (CANCER CENTER ONLY)
ALT: 23 U/L (ref 0–44)
ANION GAP: 7 (ref 5–15)
AST: 19 U/L (ref 15–41)
Albumin: 3.6 g/dL (ref 3.5–5.0)
Alkaline Phosphatase: 60 U/L (ref 38–126)
BUN: 13 mg/dL (ref 8–23)
CO2: 27 mmol/L (ref 22–32)
Calcium: 9.6 mg/dL (ref 8.9–10.3)
Chloride: 105 mmol/L (ref 98–111)
Creatinine: 0.87 mg/dL (ref 0.44–1.00)
GFR, Est AFR Am: >60 mL/min (ref >60)
GFR, Estimated: >60 mL/min (ref >60)
GLUCOSE: 116 mg/dL — AB (ref 70–99)
Potassium: 4.1 mmol/L (ref 3.5–5.1)
Sodium: 139 mmol/L (ref 135–145)
TOTAL PROTEIN: 6.9 g/dL (ref 6.5–8.1)

## 2018-06-04 LAB — CBC WITH DIFFERENTIAL (CANCER CENTER ONLY)
Abs Immature Granulocytes: 0.01 10*3/uL (ref 0.00–0.07)
Basophils Absolute: 0 10*3/uL (ref 0.0–0.1)
Basophils Relative: 1 %
EOS ABS: 0.1 10*3/uL (ref 0.0–0.5)
Eosinophils Relative: 1 %
HCT: 31.5 % — ABNORMAL LOW (ref 36.0–46.0)
Hemoglobin: 10.2 g/dL — ABNORMAL LOW (ref 12.0–15.0)
Immature Granulocytes: 0 %
Lymphocytes Relative: 31 %
Lymphs Abs: 1.2 10*3/uL (ref 0.7–4.0)
MCH: 32.1 pg (ref 26.0–34.0)
MCHC: 32.4 g/dL (ref 30.0–36.0)
MCV: 99.1 fL (ref 80.0–100.0)
Monocytes Absolute: 0.5 10*3/uL (ref 0.1–1.0)
Monocytes Relative: 13 %
NEUTROS PCT: 54 %
Neutro Abs: 2.1 10*3/uL (ref 1.7–7.7)
Platelet Count: 183 10*3/uL (ref 150–400)
RBC: 3.18 MIL/uL — ABNORMAL LOW (ref 3.87–5.11)
RDW: 12.9 % (ref 11.5–15.5)
WBC Count: 3.8 10*3/uL — ABNORMAL LOW (ref 4.0–10.5)
nRBC: 0 % (ref 0.0–0.2)

## 2018-06-04 LAB — URINE CULTURE: Culture: 80000 — AB

## 2018-06-04 NOTE — Telephone Encounter (Signed)
Requested nurse review her antiemetics with her. Instructed her that each chemo cycle is a day 1 and day 8. She will receive Aloxi on day 1 of each cycle, so the Zofran will start 72 hours after this and explained rationale. She can take the po compazine any time/any day she needs it. Stressed for her to jump on the nausea as soon as she feels it and don't wait. She verbalized understanding and was able to repeat the instructions to nurse.

## 2018-06-04 NOTE — Telephone Encounter (Signed)
Urine culture was positive for E coli and was given bactrim  at urgent care visit. Pt contacted and made aware, educated on completing antibiotic and to follow up if symptoms are persistent. Verbalized understanding.

## 2018-06-07 ENCOUNTER — Telehealth: Payer: Self-pay

## 2018-06-07 NOTE — Telephone Encounter (Signed)
Left voicemail for pt to call back [facility] 

## 2018-06-08 ENCOUNTER — Telehealth: Payer: Self-pay | Admitting: *Deleted

## 2018-06-08 NOTE — Telephone Encounter (Signed)
-----   Message from Ladell Pier, MD sent at 06/04/2018  3:24 PM EST ----- Please call patient, white count is better , f/u as scheduled, ask lab why cmet was released

## 2018-06-08 NOTE — Telephone Encounter (Signed)
Informed patient of improved WBC and normal ANC. Follow up as scheduled.

## 2018-06-10 ENCOUNTER — Other Ambulatory Visit: Payer: Self-pay | Admitting: Oncology

## 2018-06-11 ENCOUNTER — Inpatient Hospital Stay: Payer: Medicare Other

## 2018-06-11 ENCOUNTER — Inpatient Hospital Stay (HOSPITAL_BASED_OUTPATIENT_CLINIC_OR_DEPARTMENT_OTHER): Payer: Medicare Other | Admitting: Nurse Practitioner

## 2018-06-11 ENCOUNTER — Encounter: Payer: Self-pay | Admitting: Nurse Practitioner

## 2018-06-11 VITALS — BP 140/67 | HR 100 | Temp 97.5°F | Resp 17 | Ht 66.0 in | Wt 140.6 lb

## 2018-06-11 DIAGNOSIS — Z87440 Personal history of urinary (tract) infections: Secondary | ICD-10-CM | POA: Diagnosis not present

## 2018-06-11 DIAGNOSIS — C569 Malignant neoplasm of unspecified ovary: Secondary | ICD-10-CM

## 2018-06-11 DIAGNOSIS — I4891 Unspecified atrial fibrillation: Secondary | ICD-10-CM | POA: Diagnosis not present

## 2018-06-11 DIAGNOSIS — Z90722 Acquired absence of ovaries, bilateral: Secondary | ICD-10-CM | POA: Diagnosis not present

## 2018-06-11 DIAGNOSIS — Z5111 Encounter for antineoplastic chemotherapy: Secondary | ICD-10-CM | POA: Diagnosis not present

## 2018-06-11 DIAGNOSIS — C44509 Unspecified malignant neoplasm of skin of other part of trunk: Secondary | ICD-10-CM

## 2018-06-11 DIAGNOSIS — Z90710 Acquired absence of both cervix and uterus: Secondary | ICD-10-CM

## 2018-06-11 LAB — CBC WITH DIFFERENTIAL (CANCER CENTER ONLY)
Abs Immature Granulocytes: 0.02 10*3/uL (ref 0.00–0.07)
Basophils Absolute: 0 10*3/uL (ref 0.0–0.1)
Basophils Relative: 1 %
Eosinophils Absolute: 0 10*3/uL (ref 0.0–0.5)
Eosinophils Relative: 1 %
HCT: 33.8 % — ABNORMAL LOW (ref 36.0–46.0)
Hemoglobin: 10.9 g/dL — ABNORMAL LOW (ref 12.0–15.0)
Immature Granulocytes: 1 %
Lymphocytes Relative: 29 %
Lymphs Abs: 1.2 10*3/uL (ref 0.7–4.0)
MCH: 32.3 pg (ref 26.0–34.0)
MCHC: 32.2 g/dL (ref 30.0–36.0)
MCV: 100.3 fL — ABNORMAL HIGH (ref 80.0–100.0)
MONO ABS: 0.6 10*3/uL (ref 0.1–1.0)
MONOS PCT: 15 %
Neutro Abs: 2.1 10*3/uL (ref 1.7–7.7)
Neutrophils Relative %: 53 %
Platelet Count: 285 10*3/uL (ref 150–400)
RBC: 3.37 MIL/uL — ABNORMAL LOW (ref 3.87–5.11)
RDW: 14.2 % (ref 11.5–15.5)
WBC Count: 4 10*3/uL (ref 4.0–10.5)
nRBC: 0 % (ref 0.0–0.2)

## 2018-06-11 LAB — CMP (CANCER CENTER ONLY)
ALT: 27 U/L (ref 0–44)
AST: 27 U/L (ref 15–41)
Albumin: 3.7 g/dL (ref 3.5–5.0)
Alkaline Phosphatase: 61 U/L (ref 38–126)
Anion gap: 8 (ref 5–15)
BUN: 15 mg/dL (ref 8–23)
CALCIUM: 10.4 mg/dL — AB (ref 8.9–10.3)
CO2: 26 mmol/L (ref 22–32)
Chloride: 105 mmol/L (ref 98–111)
Creatinine: 0.94 mg/dL (ref 0.44–1.00)
GFR, Est AFR Am: >60 mL/min (ref >60)
GFR, Estimated: >60 mL/min (ref >60)
GLUCOSE: 83 mg/dL (ref 70–99)
Potassium: 4.6 mmol/L (ref 3.5–5.1)
Sodium: 139 mmol/L (ref 135–145)
Total Bilirubin: <0.2 mg/dL — ABNORMAL LOW (ref 0.3–1.2)
Total Protein: 7.1 g/dL (ref 6.5–8.1)

## 2018-06-11 MED ORDER — FAMOTIDINE IN NACL 20-0.9 MG/50ML-% IV SOLN
20.0000 mg | Freq: Once | INTRAVENOUS | Status: AC
  Administered 2018-06-11: 20 mg via INTRAVENOUS

## 2018-06-11 MED ORDER — PALONOSETRON HCL INJECTION 0.25 MG/5ML
INTRAVENOUS | Status: AC
  Filled 2018-06-11: qty 5

## 2018-06-11 MED ORDER — PALONOSETRON HCL INJECTION 0.25 MG/5ML
0.2500 mg | Freq: Once | INTRAVENOUS | Status: AC
  Administered 2018-06-11: 0.25 mg via INTRAVENOUS

## 2018-06-11 MED ORDER — SODIUM CHLORIDE 0.9 % IV SOLN
Freq: Once | INTRAVENOUS | Status: AC
  Administered 2018-06-11: 13:00:00 via INTRAVENOUS
  Filled 2018-06-11: qty 250

## 2018-06-11 MED ORDER — SODIUM CHLORIDE 0.9 % IV SOLN
800.0000 mg/m2 | Freq: Once | INTRAVENOUS | Status: AC
  Administered 2018-06-11: 1368 mg via INTRAVENOUS
  Filled 2018-06-11: qty 35.98

## 2018-06-11 MED ORDER — DEXAMETHASONE SODIUM PHOSPHATE 10 MG/ML IJ SOLN
INTRAMUSCULAR | Status: AC
  Filled 2018-06-11: qty 1

## 2018-06-11 MED ORDER — DEXAMETHASONE SODIUM PHOSPHATE 10 MG/ML IJ SOLN
10.0000 mg | Freq: Once | INTRAMUSCULAR | Status: AC
  Administered 2018-06-11: 10 mg via INTRAVENOUS

## 2018-06-11 MED ORDER — SODIUM CHLORIDE 0.9 % IV SOLN
303.2000 mg | Freq: Once | INTRAVENOUS | Status: AC
  Administered 2018-06-11: 300 mg via INTRAVENOUS
  Filled 2018-06-11: qty 30

## 2018-06-11 MED ORDER — FAMOTIDINE IN NACL 20-0.9 MG/50ML-% IV SOLN
INTRAVENOUS | Status: AC
  Filled 2018-06-11: qty 50

## 2018-06-11 NOTE — Patient Instructions (Addendum)
[facility] Discharge Instructions for Patients Receiving Chemotherapy  Today you received the following chemotherapy agents Gemzar and Carboplatin.   To help prevent nausea and vomiting after your treatment, we encourage you to take your nausea medication as directed.  If you develop nausea and vomiting that is not controlled by your nausea medication, call the clinic.   BELOW ARE SYMPTOMS THAT SHOULD BE REPORTED IMMEDIATELY:  *FEVER GREATER THAN 100.5 F  *CHILLS WITH OR WITHOUT FEVER  NAUSEA AND VOMITING THAT IS NOT CONTROLLED WITH YOUR NAUSEA MEDICATION  *UNUSUAL SHORTNESS OF BREATH  *UNUSUAL BRUISING OR BLEEDING  TENDERNESS IN MOUTH AND THROAT WITH OR WITHOUT PRESENCE OF ULCERS  *URINARY PROBLEMS  *BOWEL PROBLEMS  UNUSUAL RASH Items with * indicate a potential emergency and should be followed up as soon as possible.  Feel free to call the clinic should you have any questions or concerns. The clinic phone number is [phone].  Please show the [place] [facility] at check-in to the Emergency Department and triage nurse.  Gemcitabine injection What is this medicine? GEMCITABINE (jem SYE ta been) is a chemotherapy drug. This medicine is used to treat many types of cancer like breast cancer, lung cancer, pancreatic cancer, and ovarian cancer. This medicine may be used for other purposes; ask your health care provider or pharmacist if you have questions. COMMON BRAND NAME(S): Gemzar, Infugem What should I tell my health care provider before I take this medicine? They need to know if you have any of these conditions: -blood disorders -infection -kidney disease -liver disease -lung or breathing disease, like asthma -recent or ongoing radiation therapy -an unusual or allergic reaction to gemcitabine, other chemotherapy, other medicines, foods, dyes, or preservatives -pregnant or trying to get pregnant -breast-feeding How should I use this medicine? This  drug is given as an infusion into a vein. It is administered in a hospital or clinic by a specially trained health care professional. Talk to your pediatrician regarding the use of this medicine in children. Special care may be needed. Overdosage: If you think you have taken too much of this medicine contact a poison control center or emergency room at once. NOTE: This medicine is only for you. Do not share this medicine with others. What if I miss a dose? It is important not to miss your dose. Call your doctor or health care professional if you are unable to keep an appointment. What may interact with this medicine? -medicines to increase blood counts like filgrastim, pegfilgrastim, sargramostim -some other chemotherapy drugs like cisplatin -vaccines Talk to your doctor or health care professional before taking any of these medicines: -acetaminophen -aspirin -ibuprofen -ketoprofen -naproxen This list may not describe all possible interactions. Give your health care provider a list of all the medicines, herbs, non-prescription drugs, or dietary supplements you use. Also tell them if you smoke, drink alcohol, or use illegal drugs. Some items may interact with your medicine. What should I watch for while using this medicine? Visit your doctor for checks on your progress. This drug may make you feel generally unwell. This is not uncommon, as chemotherapy can affect healthy cells as well as cancer cells. Report any side effects. Continue your course of treatment even though you feel ill unless your doctor tells you to stop. In some cases, you may be given additional medicines to help with side effects. Follow all directions for their use. Call your doctor or health care professional for advice if you get a fever, chills or  sore throat, or other symptoms of a cold or flu. Do not treat yourself. This drug decreases your body's ability to fight infections. Try to avoid being around people who are  sick. This medicine may increase your risk to bruise or bleed. Call your doctor or health care professional if you notice any unusual bleeding. Be careful brushing and flossing your teeth or using a toothpick because you may get an infection or bleed more easily. If you have any dental work done, tell your dentist you are receiving this medicine. Avoid taking products that contain aspirin, acetaminophen, ibuprofen, naproxen, or ketoprofen unless instructed by your doctor. These medicines may hide a fever. Do not become pregnant while taking this medicine or for 6 months after stopping it. Women should inform their doctor if they wish to become pregnant or think they might be pregnant. Men should not father a child while taking this medicine and for 3 months after stopping it. There is a potential for serious side effects to an unborn child. Talk to your health care professional or pharmacist for more information. Do not breast-feed an infant while taking this medicine or for at least 1 week after stopping it. Men should inform their doctors if they wish to father a child. This medicine may lower sperm counts. Talk with your doctor or health care professional if you are concerned about your fertility. What side effects may I notice from receiving this medicine? Side effects that you should report to your doctor or health care professional as soon as possible: -allergic reactions like skin rash, itching or hives, swelling of the face, lips, or tongue -breathing problems -pain, redness, or irritation at site where injected -signs and symptoms of a dangerous change in heartbeat or heart rhythm like chest pain; dizziness; fast or irregular heartbeat; palpitations; feeling faint or lightheaded, falls; breathing problems -signs of decreased platelets or bleeding - bruising, pinpoint red spots on the skin, black, tarry stools, blood in the urine -signs of decreased red blood cells - unusually weak or tired,  feeling faint or lightheaded, falls -signs of infection - fever or chills, cough, sore throat, pain or difficulty passing urine -signs and symptoms of kidney injury like trouble passing urine or change in the amount of urine -signs and symptoms of liver injury like dark yellow or brown urine; general ill feeling or flu-like symptoms; light-colored stools; loss of appetite; nausea; right upper belly pain; unusually weak or tired; yellowing of the eyes or skin -swelling of ankles, feet, hands Side effects that usually do not require medical attention (report to your doctor or health care professional if they continue or are bothersome): -constipation -diarrhea -hair loss -loss of appetite -nausea -rash -vomiting This list may not describe all possible side effects. Call your doctor for medical advice about side effects. You may report side effects to FDA at 1-800-FDA-1088. Where should I keep my medicine? This drug is given in a hospital or clinic and will not be stored at home. NOTE: This sheet is a summary. It may not cover all possible information. If you have questions about this medicine, talk to your doctor, pharmacist, or health care provider.  2019 Elsevier/Gold Standard (2017-07-26 18:06:11)  Carboplatin injection What is this medicine? CARBOPLATIN (KAR boe pla tin) is a chemotherapy drug. It targets fast dividing cells, like cancer cells, and causes these cells to die. This medicine is used to treat ovarian cancer and many other cancers. This medicine may be used for other purposes; ask your health  care provider or pharmacist if you have questions. COMMON BRAND NAME(S): Paraplatin What should I tell my health care provider before I take this medicine? They need to know if you have any of these conditions: -blood disorders -hearing problems -kidney disease -recent or ongoing radiation therapy -an unusual or allergic reaction to carboplatin, cisplatin, other chemotherapy, other  medicines, foods, dyes, or preservatives -pregnant or trying to get pregnant -breast-feeding How should I use this medicine? This drug is usually given as an infusion into a vein. It is administered in a hospital or clinic by a specially trained health care professional. Talk to your pediatrician regarding the use of this medicine in children. Special care may be needed. Overdosage: If you think you have taken too much of this medicine contact a poison control center or emergency room at once. NOTE: This medicine is only for you. Do not share this medicine with others. What if I miss a dose? It is important not to miss a dose. Call your doctor or health care professional if you are unable to keep an appointment. What may interact with this medicine? -medicines for seizures -medicines to increase blood counts like filgrastim, pegfilgrastim, sargramostim -some antibiotics like amikacin, gentamicin, neomycin, streptomycin, tobramycin -vaccines Talk to your doctor or health care professional before taking any of these medicines: -acetaminophen -aspirin -ibuprofen -ketoprofen -naproxen This list may not describe all possible interactions. Give your health care provider a list of all the medicines, herbs, non-prescription drugs, or dietary supplements you use. Also tell them if you smoke, drink alcohol, or use illegal drugs. Some items may interact with your medicine. What should I watch for while using this medicine? Your condition will be monitored carefully while you are receiving this medicine. You will need important blood work done while you are taking this medicine. This drug may make you feel generally unwell. This is not uncommon, as chemotherapy can affect healthy cells as well as cancer cells. Report any side effects. Continue your course of treatment even though you feel ill unless your doctor tells you to stop. In some cases, you may be given additional medicines to help with side  effects. Follow all directions for their use. Call your doctor or health care professional for advice if you get a fever, chills or sore throat, or other symptoms of a cold or flu. Do not treat yourself. This drug decreases your body's ability to fight infections. Try to avoid being around people who are sick. This medicine may increase your risk to bruise or bleed. Call your doctor or health care professional if you notice any unusual bleeding. Be careful brushing and flossing your teeth or using a toothpick because you may get an infection or bleed more easily. If you have any dental work done, tell your dentist you are receiving this medicine. Avoid taking products that contain aspirin, acetaminophen, ibuprofen, naproxen, or ketoprofen unless instructed by your doctor. These medicines may hide a fever. Do not become pregnant while taking this medicine. Women should inform their doctor if they wish to become pregnant or think they might be pregnant. There is a potential for serious side effects to an unborn child. Talk to your health care professional or pharmacist for more information. Do not breast-feed an infant while taking this medicine. What side effects may I notice from receiving this medicine? Side effects that you should report to your doctor or health care professional as soon as possible: -allergic reactions like skin rash, itching or hives, swelling of the  face, lips, or tongue -signs of infection - fever or chills, cough, sore throat, pain or difficulty passing urine -signs of decreased platelets or bleeding - bruising, pinpoint red spots on the skin, black, tarry stools, nosebleeds -signs of decreased red blood cells - unusually weak or tired, fainting spells, lightheadedness -breathing problems -changes in hearing -changes in vision -chest pain -high blood pressure -low blood counts - This drug may decrease the number of white blood cells, red blood cells and platelets. You may be  at increased risk for infections and bleeding. -nausea and vomiting -pain, swelling, redness or irritation at the injection site -pain, tingling, numbness in the hands or feet -problems with balance, talking, walking -trouble passing urine or change in the amount of urine Side effects that usually do not require medical attention (report to your doctor or health care professional if they continue or are bothersome): -hair loss -loss of appetite -metallic taste in the mouth or changes in taste This list may not describe all possible side effects. Call your doctor for medical advice about side effects. You may report side effects to FDA at 1-800-FDA-1088. Where should I keep my medicine? This drug is given in a hospital or clinic and will not be stored at home. NOTE: This sheet is a summary. It may not cover all possible information. If you have questions about this medicine, talk to your doctor, pharmacist, or health care pro   lic taste in the mouth or changes in taste This list may not describe all possible side effects. Call your doctor for medical advice about side effects. You may report side effects to FDA at 1-800-FDA-1088. Where should I keep my medicine? This drug is given in a hospital or clinic and will not be stored at home. NOTE: This sheet is a summary. It may not cover all possible information. If you have questions about this medicine, talk to your doctor, pharmacist, or health care provider.  2019 Elsevier/Gold Standard (2007-08-07 14:38:05)

## 2018-06-11 NOTE — Progress Notes (Addendum)
[name] OFFICE PROGRESS NOTE   Diagnosis: Ovarian cancer  INTERVAL HISTORY:   [name] returns as scheduled.  She completed cycle 1 day 1 gemcitabine/carboplatin 05/21/2018.  The day 8 gemcitabine was held due to neutropenia.  She denies nausea/vomiting.  No mouth sores.  No diarrhea.  No rash.  No fever.  She recently completed a course of antibiotics for urinary tract infection.  Urinary symptoms have resolved.  She notes intermittent discomfort at the right lower posterior leg.  She denies swelling.  Objective:  Vital signs in last 24 hours:  Blood pressure 140/67, pulse 100, temperature (!) 97.5 F (36.4 C), temperature source Oral, resp. rate 17, height _0  (1.676 m), weight 140 lb 9.6 oz (63.8 kg), SpO2 100 %.    HEENT: No thrush or ulcers. Resp: Lungs clear bilaterally. Cardio: Regular rate and rhythm. GI: Abdomen soft and nontender.  No hepatomegaly.  No mass. Vascular: No leg edema.  Calves soft and nontender.  No erythema.  No palpable cord. Skin: No rash.   Lab Results:  Lab Results  Component Value Date   WBC 4.0 06/11/2018   HGB 10.9 (L) 06/11/2018   HCT 33.8 (L) 06/11/2018   MCV 100.3 (H) 06/11/2018   PLT 285 06/11/2018   NEUTROABS 2.1 06/11/2018    Imaging:  No results found.  Medications: I have reviewed the patient's current medications.  Assessment/Plan: 1. 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   Elevated CA 125.   Cycle 1 Taxol/carboplatin 08/16/2013.   Cycle 2 Taxol/carboplatin 09/06/2013.   Cycle 3 Taxol/carboplatin 09/27/2013   CT 10/11/2013 with a stable abdominal wall mass.  Status post resection of abdominal wall mass with abdominal wall reconstruction 11/05/2013. Final pathology showed adenocarcinoma. Specimen extensively involved with adenocarcinoma which focally  involved the edge of the specimen. Immunostains and Foundation 1 testing were nonspecific for a primary tumor location.  Adjuvant radiation to the abdominal wall 01/16/2014 through 02/27/2014  CT 01/15/2018 while in the emergency room for evaluation of back pain-midline abdominal wall mass above the pubic symphysis and adjacent to the bladder  Ultrasound-guided biopsy of the abdominal wall mass 01/25/2018-adenocarcinoma similar to the 2015 biopsy  PET scan 03/01/2018-hypermetabolism associated with a cystic and solid suprapubic mass. Focal uptake identified in the left L3-4 facets without underlying bony lesion. No other sites of unexpected or suspicious hypermetabolism in the neck, chest, abdomen or pelvis.  Exploratory laparotomy, resection of abdominal wall tumor, oversew of bladder peritoneum, cystoscopy 03/28/2018(findings included a cystic and solid mass encountered below the lower edge of the incision extending into the space ofretziusand adherent to the pubic arch inferiorly, bladder dome posteriorly and prior abdominal wall repair anteriorly. No nodularity palpated in the tumor bed. Visualized bowel and omentum free of disease. Pelvic peritoneum appeared normal). A "small "amount of tumor on the periosteum was cauterized using a Bovie.  Pathology on pelvic tumor-metastatic high-grade adenocarcinoma, favor clear-cell carcinoma of gynecologic origin, fragments up to 5.8 cm in size; ER negative, PR positive, 1+ (weak staining) to focally 2+, 30-40%, MSI-stable, mutation burden-4,CREBBP alteration  Cycle 1 gemcitabine/carboplatin 05/21/2018; day 8 held due to neutropenia  Cycle 2 gemcitabine/carboplatin 06/11/2018 (day 8 gemcitabine eliminated) 2. Ovarian cancer in 1988, low-grade adenocarcinoma with areas of "borderline" carcinoma, status post a hysterectomy and bilateral oophorectomy. 3. New onset atrial fibrillation August 2016, maintained on eliquis 4.  Recurrent urinary tract  infections   Disposition: [name] appears stable.  She has completed 1 cycle of gemcitabine/carboplatin.  The day 8 gemcitabine was held due to neutropenia.  Dr. Benay Spice recommends eliminating the day 8 gemcitabine beginning with cycle 2.    Plan to proceed with cycle 2 gemcitabine/carboplatin today as scheduled.  We reviewed the CBC from today.  Counts are adequate for treatment.  She had recent right calf pain.  Low clinical suspicion for DVT.  She is maintained on Eliquis for atrial fibrillation.  She understands to contact the office with continued pain, onset of swelling or erythema.  She will return for lab, follow-up and cycle 3 gemcitabine/carboplatin on 07/06/2018.  She will contact the office in the interim as outlined above or with any other problems.  Patient seen with Dr. Benay Spice.  Ned Card ANP/GNP-BC   06/11/2018  12:18 PM   This was a shared visit with Ned Card.  [name] tolerated the first cycle of gemcitabine/carboplatin well, though she developed neutropenia.  Day 8 chemotherapy was held.  We decided to discontinue day 8 chemotherapy with subsequent cycles.  [name], MD

## 2018-06-12 DIAGNOSIS — E039 Hypothyroidism, unspecified: Secondary | ICD-10-CM | POA: Diagnosis not present

## 2018-06-12 DIAGNOSIS — E049 Nontoxic goiter, unspecified: Secondary | ICD-10-CM | POA: Diagnosis not present

## 2018-06-18 ENCOUNTER — Ambulatory Visit: Payer: Medicare Other

## 2018-06-18 ENCOUNTER — Other Ambulatory Visit: Payer: Medicare Other

## 2018-07-06 ENCOUNTER — Other Ambulatory Visit: Payer: Self-pay | Admitting: Hematology

## 2018-07-06 ENCOUNTER — Inpatient Hospital Stay: Payer: Medicare Other

## 2018-07-06 ENCOUNTER — Telehealth: Payer: Self-pay | Admitting: Oncology

## 2018-07-06 ENCOUNTER — Inpatient Hospital Stay: Payer: Medicare Other | Attending: Oncology

## 2018-07-06 ENCOUNTER — Encounter: Payer: Self-pay | Admitting: Nurse Practitioner

## 2018-07-06 ENCOUNTER — Inpatient Hospital Stay (HOSPITAL_BASED_OUTPATIENT_CLINIC_OR_DEPARTMENT_OTHER): Payer: Medicare Other | Admitting: Nurse Practitioner

## 2018-07-06 VITALS — BP 120/65 | HR 61 | Temp 97.6°F | Resp 18 | Ht 66.0 in | Wt 143.3 lb

## 2018-07-06 DIAGNOSIS — C44509 Unspecified malignant neoplasm of skin of other part of trunk: Secondary | ICD-10-CM

## 2018-07-06 DIAGNOSIS — C569 Malignant neoplasm of unspecified ovary: Secondary | ICD-10-CM | POA: Insufficient documentation

## 2018-07-06 DIAGNOSIS — Z9079 Acquired absence of other genital organ(s): Secondary | ICD-10-CM | POA: Diagnosis not present

## 2018-07-06 DIAGNOSIS — I4891 Unspecified atrial fibrillation: Secondary | ICD-10-CM | POA: Insufficient documentation

## 2018-07-06 DIAGNOSIS — Z90710 Acquired absence of both cervix and uterus: Secondary | ICD-10-CM

## 2018-07-06 DIAGNOSIS — Z5111 Encounter for antineoplastic chemotherapy: Secondary | ICD-10-CM | POA: Insufficient documentation

## 2018-07-06 DIAGNOSIS — C7989 Secondary malignant neoplasm of other specified sites: Secondary | ICD-10-CM

## 2018-07-06 DIAGNOSIS — Z7901 Long term (current) use of anticoagulants: Secondary | ICD-10-CM

## 2018-07-06 DIAGNOSIS — Z90722 Acquired absence of ovaries, bilateral: Secondary | ICD-10-CM | POA: Diagnosis not present

## 2018-07-06 LAB — CBC WITH DIFFERENTIAL (CANCER CENTER ONLY)
Abs Immature Granulocytes: 0.03 10*3/uL (ref 0.00–0.07)
Basophils Absolute: 0 10*3/uL (ref 0.0–0.1)
Basophils Relative: 1 %
Eosinophils Absolute: 0 10*3/uL (ref 0.0–0.5)
Eosinophils Relative: 1 %
HCT: 33.4 % — ABNORMAL LOW (ref 36.0–46.0)
Hemoglobin: 10.8 g/dL — ABNORMAL LOW (ref 12.0–15.0)
Immature Granulocytes: 1 %
Lymphocytes Relative: 29 %
Lymphs Abs: 1.1 10*3/uL (ref 0.7–4.0)
MCH: 32.2 pg (ref 26.0–34.0)
MCHC: 32.3 g/dL (ref 30.0–36.0)
MCV: 99.7 fL (ref 80.0–100.0)
Monocytes Absolute: 0.7 10*3/uL (ref 0.1–1.0)
Monocytes Relative: 19 %
NRBC: 0 % (ref 0.0–0.2)
Neutro Abs: 1.9 10*3/uL (ref 1.7–7.7)
Neutrophils Relative %: 49 %
Platelet Count: 213 10*3/uL (ref 150–400)
RBC: 3.35 MIL/uL — ABNORMAL LOW (ref 3.87–5.11)
RDW: 14.9 % (ref 11.5–15.5)
WBC Count: 3.7 10*3/uL — ABNORMAL LOW (ref 4.0–10.5)

## 2018-07-06 LAB — CMP (CANCER CENTER ONLY)
ALBUMIN: 3.6 g/dL (ref 3.5–5.0)
ALT: 12 U/L (ref 0–44)
AST: 18 U/L (ref 15–41)
Alkaline Phosphatase: 55 U/L (ref 38–126)
Anion gap: 9 (ref 5–15)
BUN: 15 mg/dL (ref 8–23)
CO2: 27 mmol/L (ref 22–32)
Calcium: 9.5 mg/dL (ref 8.9–10.3)
Chloride: 106 mmol/L (ref 98–111)
Creatinine: 0.78 mg/dL (ref 0.44–1.00)
GFR, Est AFR Am: >60 mL/min (ref >60)
GFR, Estimated: >60 mL/min (ref >60)
GLUCOSE: 71 mg/dL (ref 70–99)
Potassium: 4.5 mmol/L (ref 3.5–5.1)
Sodium: 142 mmol/L (ref 135–145)
Total Bilirubin: 0.2 mg/dL — ABNORMAL LOW (ref 0.3–1.2)
Total Protein: 6.9 g/dL (ref 6.5–8.1)

## 2018-07-06 MED ORDER — FAMOTIDINE IN NACL 20-0.9 MG/50ML-% IV SOLN
INTRAVENOUS | Status: AC
  Filled 2018-07-06: qty 50

## 2018-07-06 MED ORDER — DEXAMETHASONE SODIUM PHOSPHATE 10 MG/ML IJ SOLN
10.0000 mg | Freq: Once | INTRAMUSCULAR | Status: AC
  Administered 2018-07-06: 10 mg via INTRAVENOUS

## 2018-07-06 MED ORDER — SODIUM CHLORIDE 0.9 % IV SOLN
800.0000 mg/m2 | Freq: Once | INTRAVENOUS | Status: AC
  Administered 2018-07-06: 1368 mg via INTRAVENOUS
  Filled 2018-07-06: qty 35.98

## 2018-07-06 MED ORDER — PALONOSETRON HCL INJECTION 0.25 MG/5ML
INTRAVENOUS | Status: AC
  Filled 2018-07-06: qty 5

## 2018-07-06 MED ORDER — FAMOTIDINE IN NACL 20-0.9 MG/50ML-% IV SOLN
20.0000 mg | Freq: Once | INTRAVENOUS | Status: AC
  Administered 2018-07-06: 20 mg via INTRAVENOUS

## 2018-07-06 MED ORDER — SODIUM CHLORIDE 0.9 % IV SOLN
303.2000 mg | Freq: Once | INTRAVENOUS | Status: AC
  Administered 2018-07-06: 300 mg via INTRAVENOUS
  Filled 2018-07-06: qty 30

## 2018-07-06 MED ORDER — PALONOSETRON HCL INJECTION 0.25 MG/5ML
0.2500 mg | Freq: Once | INTRAVENOUS | Status: AC
  Administered 2018-07-06: 0.25 mg via INTRAVENOUS

## 2018-07-06 MED ORDER — SODIUM CHLORIDE 0.9 % IV SOLN
Freq: Once | INTRAVENOUS | Status: AC
  Administered 2018-07-06: 13:00:00 via INTRAVENOUS
  Filled 2018-07-06: qty 250

## 2018-07-06 MED ORDER — FAMOTIDINE IN NACL 20-0.9 MG/50ML-% IV SOLN: 20.0000 mg | Freq: Once | INTRAVENOUS | Status: DC

## 2018-07-06 MED ORDER — SODIUM CHLORIDE 0.9 % IV SOLN
20.0000 mg | Freq: Once | INTRAVENOUS | Status: DC
  Filled 2018-07-06: qty 2

## 2018-07-06 MED ORDER — DEXAMETHASONE SODIUM PHOSPHATE 10 MG/ML IJ SOLN
INTRAMUSCULAR | Status: AC
  Filled 2018-07-06: qty 1

## 2018-07-06 NOTE — Telephone Encounter (Signed)
Gave avs and calendar ° °

## 2018-07-06 NOTE — Patient Instructions (Signed)
[facility] Discharge Instructions for Patients Receiving Chemotherapy  Today you received the following chemotherapy agents Gemzar and Carboplatin   To help prevent nausea and vomiting after your treatment, we encourage you to take your nausea medication as directed.    If you develop nausea and vomiting that is not controlled by your nausea medication, call the clinic.   BELOW ARE SYMPTOMS THAT SHOULD BE REPORTED IMMEDIATELY:  *FEVER GREATER THAN 100.5 F  *CHILLS WITH OR WITHOUT FEVER  NAUSEA AND VOMITING THAT IS NOT CONTROLLED WITH YOUR NAUSEA MEDICATION  *UNUSUAL SHORTNESS OF BREATH  *UNUSUAL BRUISING OR BLEEDING  TENDERNESS IN MOUTH AND THROAT WITH OR WITHOUT PRESENCE OF ULCERS  *URINARY PROBLEMS  *BOWEL PROBLEMS  UNUSUAL RASH Items with * indicate a potential emergency and should be followed up as soon as possible.  Feel free to call the clinic should you have any questions or concerns. The clinic phone number is [phone].  Please show the CHEMO ALERT CARD at check-in to the Emergency Department and triage nurse.   

## 2018-07-06 NOTE — Progress Notes (Signed)
[facility] OFFICE PROGRESS NOTE   Diagnosis: Ovarian cancer  INTERVAL HISTORY:   [name] returns as scheduled.  She completed cycle 2 gemcitabine/carboplatin on 06/11/2018.  The day 8 gemcitabine has been eliminated from the regimen.  She feels she is tolerating chemotherapy well.  She denies nausea/vomiting.  No mouth sores.  No diarrhea.  No fever or rash.  No shortness of breath or cough.  She has occasional dull discomfort at the left lower abdomen.  Objective:  Vital signs in last 24 hours:  Blood pressure 120/65, pulse 61, temperature 97.6 F (36.4 C), temperature source Oral, resp. rate 18, height 5' 6" (1.676 m), weight 143 lb 4.8 oz (65 kg), SpO2 100 %.    HEENT: No thrush or ulcers. Resp: Lungs clear bilaterally. Cardio: Regular rate and rhythm. GI: Abdomen soft and nontender.  No hepatomegaly.  No mass. Vascular: No leg edema. Skin: No rash.   Lab Results:  Lab Results  Component Value Date   WBC 3.7 (L) 07/06/2018   HGB 10.8 (L) 07/06/2018   HCT 33.4 (L) 07/06/2018   MCV 99.7 07/06/2018   PLT 213 07/06/2018   NEUTROABS 1.9 07/06/2018    Imaging:  No results found.  Medications: I have reviewed the patient's current medications.  Assessment/Plan: 1. 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   Elevated CA 125.   Cycle 1 Taxol/carboplatin 08/16/2013.   Cycle 2 Taxol/carboplatin 09/06/2013.   Cycle 3 Taxol/carboplatin 09/27/2013   CT 10/11/2013 with a stable abdominal wall mass.  Status post resection of abdominal wall mass with abdominal wall reconstruction 11/05/2013. Final pathology showed adenocarcinoma. Specimen extensively involved with adenocarcinoma which focally involved the edge of the specimen. Immunostains and Foundation 1 testing were nonspecific for a primary tumor  location.  Adjuvant radiation to the abdominal wall 01/16/2014 through 02/27/2014  CT 01/15/2018 while in the emergency room for evaluation of back pain-midline abdominal wall mass above the pubic symphysis and adjacent to the bladder  Ultrasound-guided biopsy of the abdominal wall mass 01/25/2018-adenocarcinoma similar to the 2015 biopsy  PET scan 03/01/2018-hypermetabolism associated with a cystic and solid suprapubic mass. Focal uptake identified in the left L3-4 facets without underlying bony lesion. No other sites of unexpected or suspicious hypermetabolism in the neck, chest, abdomen or pelvis.  Exploratory laparotomy, resection of abdominal wall tumor, oversew of bladder peritoneum, cystoscopy 03/28/2018(findings included a cystic and solid mass encountered below the lower edge of the incision extending into the space ofretziusand adherent to the pubic arch inferiorly, bladder dome posteriorly and prior abdominal wall repair anteriorly. No nodularity palpated in the tumor bed. Visualized bowel and omentum free of disease. Pelvic peritoneum appeared normal). A "small "amount of tumor on the periosteum was cauterized using a Bovie.  Pathology on pelvic tumor-metastatic high-grade adenocarcinoma, favor clear-cell carcinoma of gynecologic origin, fragments up to 5.8 cm in size; ER negative, PR positive, 1+ (weak staining) to focally 2+, 30-40%, MSI-stable, mutation burden-4,CREBBPalteration  Cycle 1 gemcitabine/carboplatin 05/21/2018; day 8 held due to neutropenia  Cycle 2 gemcitabine/carboplatin 06/11/2018 (day 8 gemcitabine eliminated)  Cycle 3 gemcitabine/carboplatin 07/06/2018 2. Ovarian cancer in 1988, low-grade adenocarcinoma with areas of "borderline" carcinoma, status post a hysterectomy and bilateral oophorectomy. 3. New onset atrial fibrillation August 2016, maintained on eliquis 4. Recurrent urinary tract infections  Disposition: [name] appears stable.  She has completed  2   cycles of gemcitabine/carboplatin.  Plan to proceed with cycle 3 today as scheduled.  We reviewed the CBC from today.  Counts are adequate for treatment.  She will return for lab, follow-up and cycle 4 gemcitabine/carboplatin in 3 weeks.  She will contact the office in the interim with any problems.      ANP/GNP-BC   07/06/2018  12:04 PM        

## 2018-07-13 ENCOUNTER — Telehealth: Payer: Self-pay

## 2018-07-13 NOTE — Telephone Encounter (Signed)
TC to Pt per Lattie Haw to let  her know that Dr. Benay Spice does not feel she needs a scan before her next visit. Pt verbalized understanding. No further problems or concerns at this time.

## 2018-07-25 ENCOUNTER — Encounter: Payer: Self-pay | Admitting: Pharmacist

## 2018-07-26 DIAGNOSIS — E049 Nontoxic goiter, unspecified: Secondary | ICD-10-CM | POA: Diagnosis not present

## 2018-07-26 DIAGNOSIS — E039 Hypothyroidism, unspecified: Secondary | ICD-10-CM | POA: Diagnosis not present

## 2018-07-27 ENCOUNTER — Other Ambulatory Visit: Payer: Self-pay

## 2018-07-27 ENCOUNTER — Inpatient Hospital Stay: Payer: Medicare Other | Attending: Oncology | Admitting: Oncology

## 2018-07-27 ENCOUNTER — Inpatient Hospital Stay: Payer: Medicare Other

## 2018-07-27 ENCOUNTER — Other Ambulatory Visit: Payer: Self-pay | Admitting: Oncology

## 2018-07-27 ENCOUNTER — Telehealth: Payer: Self-pay | Admitting: Oncology

## 2018-07-27 VITALS — BP 122/63 | HR 68 | Temp 98.2°F | Resp 16 | Wt 143.2 lb

## 2018-07-27 DIAGNOSIS — Z90722 Acquired absence of ovaries, bilateral: Secondary | ICD-10-CM | POA: Insufficient documentation

## 2018-07-27 DIAGNOSIS — Z5111 Encounter for antineoplastic chemotherapy: Secondary | ICD-10-CM | POA: Diagnosis not present

## 2018-07-27 DIAGNOSIS — C569 Malignant neoplasm of unspecified ovary: Secondary | ICD-10-CM

## 2018-07-27 DIAGNOSIS — I4891 Unspecified atrial fibrillation: Secondary | ICD-10-CM | POA: Insufficient documentation

## 2018-07-27 DIAGNOSIS — Z7901 Long term (current) use of anticoagulants: Secondary | ICD-10-CM | POA: Insufficient documentation

## 2018-07-27 DIAGNOSIS — D709 Neutropenia, unspecified: Secondary | ICD-10-CM | POA: Diagnosis not present

## 2018-07-27 DIAGNOSIS — Z8742 Personal history of other diseases of the female genital tract: Secondary | ICD-10-CM | POA: Insufficient documentation

## 2018-07-27 DIAGNOSIS — Z7689 Persons encountering health services in other specified circumstances: Secondary | ICD-10-CM | POA: Insufficient documentation

## 2018-07-27 DIAGNOSIS — Z90710 Acquired absence of both cervix and uterus: Secondary | ICD-10-CM | POA: Diagnosis not present

## 2018-07-27 DIAGNOSIS — Z87440 Personal history of urinary (tract) infections: Secondary | ICD-10-CM | POA: Diagnosis not present

## 2018-07-27 DIAGNOSIS — C44509 Unspecified malignant neoplasm of skin of other part of trunk: Secondary | ICD-10-CM

## 2018-07-27 LAB — CBC WITH DIFFERENTIAL (CANCER CENTER ONLY)
Abs Immature Granulocytes: 0.01 10*3/uL (ref 0.00–0.07)
BASOS PCT: 1 %
Basophils Absolute: 0 10*3/uL (ref 0.0–0.1)
Eosinophils Absolute: 0 10*3/uL (ref 0.0–0.5)
Eosinophils Relative: 1 %
HCT: 32.4 % — ABNORMAL LOW (ref 36.0–46.0)
Hemoglobin: 10.6 g/dL — ABNORMAL LOW (ref 12.0–15.0)
Immature Granulocytes: 0 %
Lymphocytes Relative: 28 %
Lymphs Abs: 0.8 10*3/uL (ref 0.7–4.0)
MCH: 33.4 pg (ref 26.0–34.0)
MCHC: 32.7 g/dL (ref 30.0–36.0)
MCV: 102.2 fL — ABNORMAL HIGH (ref 80.0–100.0)
Monocytes Absolute: 0.6 10*3/uL (ref 0.1–1.0)
Monocytes Relative: 21 %
Neutro Abs: 1.3 10*3/uL — ABNORMAL LOW (ref 1.7–7.7)
Neutrophils Relative %: 49 %
PLATELETS: 309 10*3/uL (ref 150–400)
RBC: 3.17 MIL/uL — ABNORMAL LOW (ref 3.87–5.11)
RDW: 16.4 % — ABNORMAL HIGH (ref 11.5–15.5)
WBC Count: 2.7 10*3/uL — ABNORMAL LOW (ref 4.0–10.5)
nRBC: 0 % (ref 0.0–0.2)

## 2018-07-27 LAB — CMP (CANCER CENTER ONLY)
ALT: 27 U/L (ref 0–44)
AST: 24 U/L (ref 15–41)
Albumin: 3.6 g/dL (ref 3.5–5.0)
Alkaline Phosphatase: 59 U/L (ref 38–126)
Anion gap: 12 (ref 5–15)
BUN: 14 mg/dL (ref 8–23)
CO2: 24 mmol/L (ref 22–32)
Calcium: 9.4 mg/dL (ref 8.9–10.3)
Chloride: 106 mmol/L (ref 98–111)
Creatinine: 0.82 mg/dL (ref 0.44–1.00)
GFR, Est AFR Am: >60 mL/min (ref >60)
GFR, Estimated: >60 mL/min (ref >60)
Glucose, Bld: 91 mg/dL (ref 70–99)
Potassium: 4.1 mmol/L (ref 3.5–5.1)
Sodium: 142 mmol/L (ref 135–145)
Total Bilirubin: 0.3 mg/dL (ref 0.3–1.2)
Total Protein: 6.9 g/dL (ref 6.5–8.1)

## 2018-07-27 MED ORDER — SODIUM CHLORIDE 0.9 % IV SOLN
20.0000 mg | Freq: Once | INTRAVENOUS | Status: AC
  Administered 2018-07-27: 20 mg via INTRAVENOUS
  Filled 2018-07-27: qty 2

## 2018-07-27 MED ORDER — PALONOSETRON HCL INJECTION 0.25 MG/5ML
INTRAVENOUS | Status: AC
  Filled 2018-07-27: qty 5

## 2018-07-27 MED ORDER — SODIUM CHLORIDE 0.9 % IV SOLN
800.0000 mg/m2 | Freq: Once | INTRAVENOUS | Status: AC
  Administered 2018-07-27: 1368 mg via INTRAVENOUS
  Filled 2018-07-27: qty 35.98

## 2018-07-27 MED ORDER — DIPHENHYDRAMINE HCL 25 MG PO CAPS
ORAL_CAPSULE | ORAL | Status: AC
  Filled 2018-07-27: qty 1

## 2018-07-27 MED ORDER — DIPHENHYDRAMINE HCL 25 MG PO CAPS
25.0000 mg | ORAL_CAPSULE | Freq: Once | ORAL | Status: AC
  Administered 2018-07-27: 25 mg via ORAL

## 2018-07-27 MED ORDER — DEXAMETHASONE SODIUM PHOSPHATE 10 MG/ML IJ SOLN
INTRAMUSCULAR | Status: AC
  Filled 2018-07-27: qty 1

## 2018-07-27 MED ORDER — SODIUM CHLORIDE 0.9 % IV SOLN
303.2000 mg | Freq: Once | INTRAVENOUS | Status: AC
  Administered 2018-07-27: 300 mg via INTRAVENOUS
  Filled 2018-07-27: qty 30

## 2018-07-27 MED ORDER — DEXAMETHASONE SODIUM PHOSPHATE 10 MG/ML IJ SOLN
10.0000 mg | Freq: Once | INTRAMUSCULAR | Status: AC
  Administered 2018-07-27: 10 mg via INTRAVENOUS

## 2018-07-27 MED ORDER — PALONOSETRON HCL INJECTION 0.25 MG/5ML
0.2500 mg | Freq: Once | INTRAVENOUS | Status: AC
  Administered 2018-07-27: 0.25 mg via INTRAVENOUS

## 2018-07-27 MED ORDER — SODIUM CHLORIDE 0.9 % IV SOLN
Freq: Once | INTRAVENOUS | Status: AC
  Administered 2018-07-27: 13:00:00 via INTRAVENOUS
  Filled 2018-07-27: qty 250

## 2018-07-27 NOTE — Patient Instructions (Signed)
[facility] Center Discharge Instructions for Patients Receiving Chemotherapy  Today you received the following chemotherapy agents Gemzar and Carboplatin   To help prevent nausea and vomiting after your treatment, we encourage you to take your nausea medication as directed.    If you develop nausea and vomiting that is not controlled by your nausea medication, call the clinic.   BELOW ARE SYMPTOMS THAT SHOULD BE REPORTED IMMEDIATELY:  *FEVER GREATER THAN 100.5 F  *CHILLS WITH OR WITHOUT FEVER  NAUSEA AND VOMITING THAT IS NOT CONTROLLED WITH YOUR NAUSEA MEDICATION  *UNUSUAL SHORTNESS OF BREATH  *UNUSUAL BRUISING OR BLEEDING  TENDERNESS IN MOUTH AND THROAT WITH OR WITHOUT PRESENCE OF ULCERS  *URINARY PROBLEMS  *BOWEL PROBLEMS  UNUSUAL RASH Items with * indicate a potential emergency and should be followed up as soon as possible.  Feel free to call the clinic should you have any questions or concerns. The clinic phone number is [phone].  Please show the CHEMO ALERT CARD at check-in to the Emergency Department and triage nurse.   

## 2018-07-27 NOTE — Progress Notes (Signed)
MD review of CBC: OK to treat with ANC 1.2. Patient will receive Udenyca on Monday.

## 2018-07-27 NOTE — Progress Notes (Signed)
[facility] OFFICE PROGRESS NOTE   Diagnosis: Ovarian cancer  INTERVAL HISTORY:   [name] returns as scheduled.  She completed another cycle of gemcitabine/carboplatin on 07/06/2018.  No fever, nausea, or rash.  She reports feeling cold a few days after chemotherapy.  No abdominal pain.  Objective:  Vital signs in last 24 hours:  Blood pressure 122/63, pulse 68, temperature 98.2 F (36.8 C), temperature source Oral, resp. rate 16, weight 143 lb 3.2 oz (65 kg), SpO2 100 %.    HEENT: The tongue is dry, no ulcers or thrush Resp: Lungs clear bilaterally Cardio: Regular rate and rhythm with premature beats GI: No hepatomegaly, no mass, nontender Vascular: No leg edema     Lab Results:  Lab Results  Component Value Date   WBC 2.7 (L) 07/27/2018   HGB 10.6 (L) 07/27/2018   HCT 32.4 (L) 07/27/2018   MCV 102.2 (H) 07/27/2018   PLT 309 07/27/2018   NEUTROABS 1.3 (L) 07/27/2018    CMP  Lab Results  Component Value Date   NA 142 07/27/2018   K 4.1 07/27/2018   CL 106 07/27/2018   CO2 24 07/27/2018   GLUCOSE 91 07/27/2018   BUN 14 07/27/2018   CREATININE 0.82 07/27/2018   CALCIUM 9.4 07/27/2018   PROT 6.9 07/27/2018   ALBUMIN 3.6 07/27/2018   AST 24 07/27/2018   ALT 27 07/27/2018   ALKPHOS 59 07/27/2018   BILITOT 0.3 07/27/2018   GFRNONAA >60 07/27/2018   GFRAA >60 07/27/2018     Medications: I have reviewed the patient's current medications.   Assessment/Plan: 1. 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   Elevated CA 125.   Cycle 1 Taxol/carboplatin 08/16/2013.   Cycle 2 Taxol/carboplatin 09/06/2013.   Cycle 3 Taxol/carboplatin 09/27/2013   CT 10/11/2013 with a stable abdominal wall mass.  Status post resection of abdominal wall mass with abdominal wall reconstruction 11/05/2013. Final  pathology showed adenocarcinoma. Specimen extensively involved with adenocarcinoma which focally involved the edge of the specimen. Immunostains and Foundation 1 testing were nonspecific for a primary tumor location.  Adjuvant radiation to the abdominal wall 01/16/2014 through 02/27/2014  CT 01/15/2018 while in the emergency room for evaluation of back pain-midline abdominal wall mass above the pubic symphysis and adjacent to the bladder  Ultrasound-guided biopsy of the abdominal wall mass 01/25/2018-adenocarcinoma similar to the 2015 biopsy  PET scan 03/01/2018-hypermetabolism associated with a cystic and solid suprapubic mass. Focal uptake identified in the left L3-4 facets without underlying bony lesion. No other sites of unexpected or suspicious hypermetabolism in the neck, chest, abdomen or pelvis.  Exploratory laparotomy, resection of abdominal wall tumor, oversew of bladder peritoneum, cystoscopy 03/28/2018(findings included a cystic and solid mass encountered below the lower edge of the incision extending into the space ofretziusand adherent to the pubic arch inferiorly, bladder dome posteriorly and prior abdominal wall repair anteriorly. No nodularity palpated in the tumor bed. Visualized bowel and omentum free of disease. Pelvic peritoneum appeared normal). A "small "amount of tumor on the periosteum was cauterized using a Bovie.  Pathology on pelvic tumor-metastatic high-grade adenocarcinoma, favor clear-cell carcinoma of gynecologic origin, fragments up to 5.8 cm in size; ER negative, PR positive, 1+ (weak staining) to focally 2+, 30-40%, MSI-stable, mutation burden-4,CREBBPalteration  Cycle 1 gemcitabine/carboplatin 05/21/2018; day 8 held due to neutropenia  Cycle 2 gemcitabine/carboplatin 06/11/2018 (day 8  gemcitabine eliminated)  Cycle 3 gemcitabine/carboplatin 07/06/2018  Cycle 4 gemcitabine/carboplatin 07/27/2018 (Udenyca added) 2. Ovarian cancer in 1988, low-grade  adenocarcinoma with areas of "borderline" carcinoma, status post a hysterectomy and bilateral oophorectomy. 3. New onset atrial fibrillation August 2016, maintained on eliquis 4. Recurrent urinary tract infections    Disposition: [name] appears unchanged.  She has mild neutropenia today.  We discussed the risk of infection.  She will call for a fever or symptoms of an infection.  The plan is to proceed with cycle 4 gemcitabine/carboplatin today.  She will receive Udenyca on day 2.  We reviewed potential toxicities associated with Udenyca including the chance for a rash, splenic rupture, and bone pain.  She agrees to proceed.  [name] will return for cycle 5 chemotherapy in 3 weeks.  The plan is to complete a total of 6 cycles of gemcitabine/carboplatin.  [name], MD  07/27/2018  12:34 PM

## 2018-07-27 NOTE — Telephone Encounter (Signed)
Gave avs and calendar ° °

## 2018-07-30 ENCOUNTER — Other Ambulatory Visit: Payer: Self-pay

## 2018-07-30 ENCOUNTER — Inpatient Hospital Stay: Payer: Medicare Other

## 2018-07-30 DIAGNOSIS — C44509 Unspecified malignant neoplasm of skin of other part of trunk: Secondary | ICD-10-CM

## 2018-07-30 DIAGNOSIS — Z90710 Acquired absence of both cervix and uterus: Secondary | ICD-10-CM | POA: Diagnosis not present

## 2018-07-30 DIAGNOSIS — D709 Neutropenia, unspecified: Secondary | ICD-10-CM | POA: Diagnosis not present

## 2018-07-30 DIAGNOSIS — C569 Malignant neoplasm of unspecified ovary: Secondary | ICD-10-CM

## 2018-07-30 DIAGNOSIS — Z7689 Persons encountering health services in other specified circumstances: Secondary | ICD-10-CM | POA: Diagnosis not present

## 2018-07-30 DIAGNOSIS — Z90722 Acquired absence of ovaries, bilateral: Secondary | ICD-10-CM | POA: Diagnosis not present

## 2018-07-30 DIAGNOSIS — Z5111 Encounter for antineoplastic chemotherapy: Secondary | ICD-10-CM | POA: Diagnosis not present

## 2018-07-30 MED ORDER — PEGFILGRASTIM-CBQV 6 MG/0.6ML ~~LOC~~ SOSY
6.0000 mg | PREFILLED_SYRINGE | Freq: Once | SUBCUTANEOUS | Status: AC
  Administered 2018-07-30: 6 mg via SUBCUTANEOUS

## 2018-07-30 NOTE — Patient Instructions (Signed)
Pegfilgrastim injection  What is this medicine?  PEGFILGRASTIM (PEG fil gra stim) is a long-acting granulocyte colony-stimulating factor that stimulates the growth of neutrophils, a type of white blood cell important in the body's fight against infection. It is used to reduce the incidence of fever and infection in patients with certain types of cancer who are receiving chemotherapy that affects the bone marrow, and to increase survival after being exposed to high doses of radiation.  This medicine may be used for other purposes; ask your health care provider or pharmacist if you have questions.  COMMON BRAND NAME(S): Fulphila, Neulasta, UDENYCA  What should I tell my health care provider before I take this medicine?  They need to know if you have any of these conditions:  -kidney disease  -latex allergy  -ongoing radiation therapy  -sickle cell disease  -skin reactions to acrylic adhesives (On-Body Injector only)  -an unusual or allergic reaction to pegfilgrastim, filgrastim, other medicines, foods, dyes, or preservatives  -pregnant or trying to get pregnant  -breast-feeding  How should I use this medicine?  This medicine is for injection under the skin. If you get this medicine at home, you will be taught how to prepare and give the pre-filled syringe or how to use the On-body Injector. Refer to the patient Instructions for Use for detailed instructions. Use exactly as directed. Tell your healthcare provider immediately if you suspect that the On-body Injector may not have performed as intended or if you suspect the use of the On-body Injector resulted in a missed or partial dose.  It is important that you put your used needles and syringes in a special sharps container. Do not put them in a trash can. If you do not have a sharps container, call your pharmacist or healthcare provider to get one.  Talk to your pediatrician regarding the use of this medicine in children. While this drug may be prescribed for  selected conditions, precautions do apply.  Overdosage: If you think you have taken too much of this medicine contact a poison control center or emergency room at once.  NOTE: This medicine is only for you. Do not share this medicine with others.  What if I miss a dose?  It is important not to miss your dose. Call your doctor or health care professional if you miss your dose. If you miss a dose due to an On-body Injector failure or leakage, a new dose should be administered as soon as possible using a single prefilled syringe for manual use.  What may interact with this medicine?  Interactions have not been studied.  Give your health care provider a list of all the medicines, herbs, non-prescription drugs, or dietary supplements you use. Also tell them if you smoke, drink alcohol, or use illegal drugs. Some items may interact with your medicine.  This list may not describe all possible interactions. Give your health care provider a list of all the medicines, herbs, non-prescription drugs, or dietary supplements you use. Also tell them if you smoke, drink alcohol, or use illegal drugs. Some items may interact with your medicine.  What should I watch for while using this medicine?  You may need blood work done while you are taking this medicine.  If you are going to need a MRI, CT scan, or other procedure, tell your doctor that you are using this medicine (On-Body Injector only).  What side effects may I notice from receiving this medicine?  Side effects that you should report to   your doctor or health care professional as soon as possible:  -allergic reactions like skin rash, itching or hives, swelling of the face, lips, or tongue  -back pain  -dizziness  -fever  -pain, redness, or irritation at site where injected  -pinpoint red spots on the skin  -red or dark-brown urine  -shortness of breath or breathing problems  -stomach or side pain, or pain at the shoulder  -swelling  -tiredness  -trouble passing urine or  change in the amount of urine  Side effects that usually do not require medical attention (report to your doctor or health care professional if they continue or are bothersome):  -bone pain  -muscle pain  This list may not describe all possible side effects. Call your doctor for medical advice about side effects. You may report side effects to FDA at 1-800-FDA-1088.  Where should I keep my medicine?  Keep out of the reach of children.  If you are using this medicine at home, you will be instructed on how to store it. Throw away any unused medicine after the expiration date on the label.  NOTE: This sheet is a summary. It may not cover all possible information. If you have questions about this medicine, talk to your doctor, pharmacist, or health care provider.   2019 [employer]vier/[employer] Standard (2017-08-07 16:57:08)

## 2018-08-13 ENCOUNTER — Other Ambulatory Visit: Payer: Self-pay | Admitting: Oncology

## 2018-08-17 ENCOUNTER — Other Ambulatory Visit: Payer: Self-pay | Admitting: *Deleted

## 2018-08-17 ENCOUNTER — Telehealth: Payer: Self-pay | Admitting: *Deleted

## 2018-08-17 ENCOUNTER — Inpatient Hospital Stay: Payer: Medicare Other

## 2018-08-17 ENCOUNTER — Telehealth: Payer: Self-pay | Admitting: Oncology

## 2018-08-17 ENCOUNTER — Inpatient Hospital Stay: Payer: Medicare Other | Attending: Oncology | Admitting: Oncology

## 2018-08-17 ENCOUNTER — Other Ambulatory Visit: Payer: Self-pay

## 2018-08-17 VITALS — BP 114/65 | HR 64 | Temp 98.3°F | Resp 18 | Ht 66.0 in | Wt 142.9 lb

## 2018-08-17 DIAGNOSIS — Z7689 Persons encountering health services in other specified circumstances: Secondary | ICD-10-CM

## 2018-08-17 DIAGNOSIS — C44509 Unspecified malignant neoplasm of skin of other part of trunk: Secondary | ICD-10-CM

## 2018-08-17 DIAGNOSIS — N3001 Acute cystitis with hematuria: Secondary | ICD-10-CM

## 2018-08-17 DIAGNOSIS — Z87440 Personal history of urinary (tract) infections: Secondary | ICD-10-CM | POA: Diagnosis not present

## 2018-08-17 DIAGNOSIS — N390 Urinary tract infection, site not specified: Secondary | ICD-10-CM | POA: Diagnosis not present

## 2018-08-17 DIAGNOSIS — C786 Secondary malignant neoplasm of retroperitoneum and peritoneum: Secondary | ICD-10-CM

## 2018-08-17 DIAGNOSIS — C569 Malignant neoplasm of unspecified ovary: Secondary | ICD-10-CM

## 2018-08-17 DIAGNOSIS — Z90722 Acquired absence of ovaries, bilateral: Secondary | ICD-10-CM | POA: Diagnosis not present

## 2018-08-17 DIAGNOSIS — R350 Frequency of micturition: Secondary | ICD-10-CM | POA: Insufficient documentation

## 2018-08-17 DIAGNOSIS — Z7901 Long term (current) use of anticoagulants: Secondary | ICD-10-CM | POA: Insufficient documentation

## 2018-08-17 DIAGNOSIS — Z5111 Encounter for antineoplastic chemotherapy: Secondary | ICD-10-CM | POA: Diagnosis not present

## 2018-08-17 DIAGNOSIS — I4891 Unspecified atrial fibrillation: Secondary | ICD-10-CM | POA: Insufficient documentation

## 2018-08-17 DIAGNOSIS — B373 Candidiasis of vulva and vagina: Secondary | ICD-10-CM | POA: Diagnosis not present

## 2018-08-17 DIAGNOSIS — Z90710 Acquired absence of both cervix and uterus: Secondary | ICD-10-CM

## 2018-08-17 LAB — CBC WITH DIFFERENTIAL (CANCER CENTER ONLY)
Abs Immature Granulocytes: 0.01 10*3/uL (ref 0.00–0.07)
Basophils Absolute: 0 10*3/uL (ref 0.0–0.1)
Basophils Relative: 0 %
Eosinophils Absolute: 0 10*3/uL (ref 0.0–0.5)
Eosinophils Relative: 0 %
HCT: 31.2 % — ABNORMAL LOW (ref 36.0–46.0)
Hemoglobin: 9.9 g/dL — ABNORMAL LOW (ref 12.0–15.0)
Immature Granulocytes: 0 %
Lymphocytes Relative: 15 %
Lymphs Abs: 0.9 10*3/uL (ref 0.7–4.0)
MCH: 33.1 pg (ref 26.0–34.0)
MCHC: 31.7 g/dL (ref 30.0–36.0)
MCV: 104.3 fL — ABNORMAL HIGH (ref 80.0–100.0)
Monocytes Absolute: 0.7 10*3/uL (ref 0.1–1.0)
Monocytes Relative: 12 %
Neutro Abs: 4.3 10*3/uL (ref 1.7–7.7)
Neutrophils Relative %: 73 %
Platelet Count: 268 10*3/uL (ref 150–400)
RBC: 2.99 MIL/uL — ABNORMAL LOW (ref 3.87–5.11)
RDW: 15.6 % — ABNORMAL HIGH (ref 11.5–15.5)
WBC Count: 5.9 10*3/uL (ref 4.0–10.5)
nRBC: 0 % (ref 0.0–0.2)

## 2018-08-17 LAB — URINALYSIS, COMPLETE (UACMP) WITH MICROSCOPIC
Bilirubin Urine: NEGATIVE
Glucose, UA: NEGATIVE mg/dL
Ketones, ur: NEGATIVE mg/dL
Nitrite: NEGATIVE
Protein, ur: NEGATIVE mg/dL
Specific Gravity, Urine: 1.003 — ABNORMAL LOW (ref 1.005–1.030)
pH: 7 (ref 5.0–8.0)

## 2018-08-17 LAB — CMP (CANCER CENTER ONLY)
ALT: 15 U/L (ref 0–44)
AST: 17 U/L (ref 15–41)
Albumin: 3.7 g/dL (ref 3.5–5.0)
Alkaline Phosphatase: 83 U/L (ref 38–126)
Anion gap: 9 (ref 5–15)
BUN: 15 mg/dL (ref 8–23)
CO2: 28 mmol/L (ref 22–32)
Calcium: 10.1 mg/dL (ref 8.9–10.3)
Chloride: 103 mmol/L (ref 98–111)
Creatinine: 0.81 mg/dL (ref 0.44–1.00)
GFR, Est AFR Am: >60 mL/min (ref >60)
GFR, Estimated: >60 mL/min (ref >60)
Glucose, Bld: 92 mg/dL (ref 70–99)
Potassium: 4.4 mmol/L (ref 3.5–5.1)
Sodium: 140 mmol/L (ref 135–145)
Total Bilirubin: 0.3 mg/dL (ref 0.3–1.2)
Total Protein: 7.4 g/dL (ref 6.5–8.1)

## 2018-08-17 MED ORDER — SODIUM CHLORIDE 0.9 % IV SOLN
Freq: Once | INTRAVENOUS | Status: AC
  Administered 2018-08-17: 12:00:00 via INTRAVENOUS
  Filled 2018-08-17: qty 250

## 2018-08-17 MED ORDER — CIPROFLOXACIN HCL 500 MG PO TABS: 500.0000 mg | ORAL_TABLET | Freq: Two times a day (BID) | ORAL | 0 refills | Status: DC

## 2018-08-17 MED ORDER — PALONOSETRON HCL INJECTION 0.25 MG/5ML
0.2500 mg | Freq: Once | INTRAVENOUS | Status: AC
  Administered 2018-08-17: 0.25 mg via INTRAVENOUS

## 2018-08-17 MED ORDER — SODIUM CHLORIDE 0.9 % IV SOLN
Freq: Once | INTRAVENOUS | Status: AC
  Administered 2018-08-17: 13:00:00 via INTRAVENOUS
  Filled 2018-08-17: qty 250

## 2018-08-17 MED ORDER — SODIUM CHLORIDE 0.9 % IV SOLN
20.0000 mg | Freq: Once | INTRAVENOUS | Status: AC
  Administered 2018-08-17: 12:00:00 20 mg via INTRAVENOUS
  Filled 2018-08-17: qty 2

## 2018-08-17 MED ORDER — LORATADINE 10 MG PO TABS
ORAL_TABLET | ORAL | Status: AC
  Filled 2018-08-17: qty 1

## 2018-08-17 MED ORDER — FLUCONAZOLE 150 MG PO TABS: 150.0000 mg | ORAL_TABLET | ORAL | 0 refills | Status: AC

## 2018-08-17 MED ORDER — DEXAMETHASONE SODIUM PHOSPHATE 10 MG/ML IJ SOLN
INTRAMUSCULAR | Status: AC
  Filled 2018-08-17: qty 1

## 2018-08-17 MED ORDER — PALONOSETRON HCL INJECTION 0.25 MG/5ML
INTRAVENOUS | Status: AC
  Filled 2018-08-17: qty 5

## 2018-08-17 MED ORDER — DEXAMETHASONE SODIUM PHOSPHATE 10 MG/ML IJ SOLN
10.0000 mg | Freq: Once | INTRAMUSCULAR | Status: AC
  Administered 2018-08-17: 10 mg via INTRAVENOUS

## 2018-08-17 MED ORDER — SODIUM CHLORIDE 0.9 % IV SOLN
303.2000 mg | Freq: Once | INTRAVENOUS | Status: AC
  Administered 2018-08-17: 300 mg via INTRAVENOUS
  Filled 2018-08-17: qty 30

## 2018-08-17 MED ORDER — LORATADINE 10 MG PO TABS
10.0000 mg | ORAL_TABLET | Freq: Once | ORAL | Status: AC
  Administered 2018-08-17: 10 mg via ORAL

## 2018-08-17 MED ORDER — SODIUM CHLORIDE 0.9 % IV SOLN
800.0000 mg/m2 | Freq: Once | INTRAVENOUS | Status: AC
  Administered 2018-08-17: 1368 mg via INTRAVENOUS
  Filled 2018-08-17: qty 35.98

## 2018-08-17 NOTE — Telephone Encounter (Signed)
Notified of U/A results and most likely has UTI. Will order cipro for 3 days. Call if not better. Urine culture ordered.

## 2018-08-17 NOTE — Patient Instructions (Signed)
..Coronavirus (COVID-19) Are you at risk?  Are you at risk for the Coronavirus (COVID-19)?  To be considered HIGH RISK for Coronavirus (COVID-19), you have to meet the following criteria:  . Traveled to [place], [place], [place], [place] or [place]; or in the [place] to [place], [place], [place], or [place]; and have fever, cough, and shortness of breath within the last 2 weeks of travel OR . Been in close contact with a person diagnosed with COVID-19 within the last 2 weeks and have fever, cough, and shortness of breath . IF YOU DO NOT MEET THESE CRITERIA, YOU ARE CONSIDERED LOW RISK FOR COVID-19.  What to do if you are HIGH RISK for COVID-19?  Marland Kitchen If you are having a medical emergency, call 911. . Seek medical care right away. Before you go to a doctor's office, urgent care or emergency department, call ahead and tell them about your recent travel, contact with someone diagnosed with COVID-19, and your symptoms. You should receive instructions from your physician's office regarding next steps of care.  . When you arrive at healthcare provider, tell the healthcare staff immediately you have returned from visiting [place], [place], [place], [place] or [place]; or traveled in the [place] to [place], [place], [place], or [place]; in the last two weeks or you have been in close contact with a person diagnosed with COVID-19 in the last 2 weeks.   . Tell the health care staff about your symptoms: fever, cough and shortness of breath. . After you have been seen by a medical provider, you will be either: o Tested for (COVID-19) and discharged home on quarantine except to seek medical care if symptoms worsen, and asked to  - Stay home and avoid contact with others until you get your results (4-5 days)  - Avoid travel on public transportation if possible (such as bus, train, or airplane) or o Sent to the Emergency Department by EMS for evaluation, COVID-19 testing, and possible  admission depending on your condition and test results.  What to do if you are LOW RISK for COVID-19?  Reduce your risk of any infection by using the same precautions used for avoiding the common cold or flu:  Marland Kitchen Wash your hands often with soap and warm water for at least 20 seconds.  If soap and water are not readily available, use an alcohol-based hand sanitizer with at least 60% alcohol.  . If coughing or sneezing, cover your mouth and nose by coughing or sneezing into the elbow areas of your shirt or coat, into a tissue or into your sleeve (not your hands). . Avoid shaking hands with others and consider head nods or verbal greetings only. . Avoid touching your eyes, nose, or mouth with unwashed hands.  . Avoid close contact with people who are sick. . Avoid places or events with large numbers of people in one location, like concerts or sporting events. . Carefully consider travel plans you have or are making. . If you are planning any travel outside or inside the [place], visit the CDC's Travelers' Health webpage for the latest health notices. . If you have some symptoms but not all symptoms, continue to monitor at home and seek medical attention if your symptoms worsen. . If you are having a medical emergency, call 911.   [place] / e-Visit: [email or web address]         [facility]: [place]  [facility]: [place] [facility]: [place] Discharge Instructions for Patients Receiving Chemotherapy  Today you received the following chemotherapy agents:  Gemzar and Carboplatin.  To help prevent nausea and vomiting after your treatment, we encourage you to take your nausea medication as directed.   If you develop nausea and vomiting that is not controlled by your nausea medication, call the  clinic.   BELOW ARE SYMPTOMS THAT SHOULD BE REPORTED IMMEDIATELY:  *FEVER GREATER THAN 100.5 F  *CHILLS WITH OR WITHOUT FEVER  NAUSEA AND VOMITING THAT IS NOT CONTROLLED WITH YOUR NAUSEA MEDICATION  *UNUSUAL SHORTNESS OF BREATH  *UNUSUAL BRUISING OR BLEEDING  TENDERNESS IN MOUTH AND THROAT WITH OR WITHOUT PRESENCE OF ULCERS  *URINARY PROBLEMS  *BOWEL PROBLEMS  UNUSUAL RASH Items with * indicate a potential emergency and should be followed up as soon as possible.  Feel free to call the clinic should you have any questions or concerns. The clinic phone number is [phone].  Please show the [facility] at check-in to the Emergency Department and triage nurse.

## 2018-08-17 NOTE — Telephone Encounter (Signed)
Per 4/3 los appts already scheduled.

## 2018-08-17 NOTE — Progress Notes (Signed)
[facility] OFFICE PROGRESS NOTE   Diagnosis: Ovarian cancer  INTERVAL HISTORY:   [name] returns as scheduled.  She completed another treatment with gemcitabine/carboplatin on 07/27/2018.  She reports tolerated treatment well.  No nausea.  She has intermittent mild discomfort in the low abdomen.  She takes hydrocodone as needed for "restless leg "pain.  She has vaginal itching and nighttime urinary frequency.  She request a prescription for Diflucan.  No pain after receiving Udenyca.  Objective:  Vital signs in last 24 hours:  Blood pressure 114/65, pulse 64, temperature 98.3 F (36.8 C), temperature source Oral, resp. rate 18, height 5' 6"  (1.676 m), weight 142 lb 14.4 oz (64.8 kg), SpO2 100 %.   Physical examination-not performed today  Lab Results:  Lab Results  Component Value Date   WBC 5.9 08/17/2018   HGB 9.9 (L) 08/17/2018   HCT 31.2 (L) 08/17/2018   MCV 104.3 (H) 08/17/2018   PLT 268 08/17/2018   NEUTROABS 4.3 08/17/2018    CMP  Lab Results  Component Value Date   NA 140 08/17/2018   K 4.4 08/17/2018   CL 103 08/17/2018   CO2 28 08/17/2018   GLUCOSE 92 08/17/2018   BUN 15 08/17/2018   CREATININE 0.81 08/17/2018   CALCIUM 10.1 08/17/2018   PROT 7.4 08/17/2018   ALBUMIN 3.7 08/17/2018   AST 17 08/17/2018   ALT 15 08/17/2018   ALKPHOS 83 08/17/2018   BILITOT 0.3 08/17/2018   GFRNONAA >60 08/17/2018   GFRAA >60 08/17/2018     Medications: I have reviewed the patient's current medications.   Assessment/Plan: 1. 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   Elevated CA 125.   Cycle 1 Taxol/carboplatin 08/16/2013.   Cycle 2 Taxol/carboplatin 09/06/2013.   Cycle 3 Taxol/carboplatin 09/27/2013   CT 10/11/2013 with a stable abdominal wall mass.  Status post resection of abdominal wall  mass with abdominal wall reconstruction 11/05/2013. Final pathology showed adenocarcinoma. Specimen extensively involved with adenocarcinoma which focally involved the edge of the specimen. Immunostains and Foundation 1 testing were nonspecific for a primary tumor location.  Adjuvant radiation to the abdominal wall 01/16/2014 through 02/27/2014  CT 01/15/2018 while in the emergency room for evaluation of back pain-midline abdominal wall mass above the pubic symphysis and adjacent to the bladder  Ultrasound-guided biopsy of the abdominal wall mass 01/25/2018-adenocarcinoma similar to the 2015 biopsy  PET scan 03/01/2018-hypermetabolism associated with a cystic and solid suprapubic mass. Focal uptake identified in the left L3-4 facets without underlying bony lesion. No other sites of unexpected or suspicious hypermetabolism in the neck, chest, abdomen or pelvis.  Exploratory laparotomy, resection of abdominal wall tumor, oversew of bladder peritoneum, cystoscopy 03/28/2018(findings included a cystic and solid mass encountered below the lower edge of the incision extending into the space ofretziusand adherent to the pubic arch inferiorly, bladder dome posteriorly and prior abdominal wall repair anteriorly. No nodularity palpated in the tumor bed. Visualized bowel and omentum free of disease. Pelvic peritoneum appeared normal). A "small "amount of tumor on the periosteum was cauterized using a Bovie.  Pathology on pelvic tumor-metastatic high-grade adenocarcinoma, favor clear-cell carcinoma of gynecologic origin, fragments up to 5.8 cm in size; ER negative, PR positive, 1+ (weak staining) to focally 2+, 30-40%, MSI-stable, mutation burden-4,CREBBPalteration  Cycle 1 gemcitabine/carboplatin 05/21/2018; day 8 held due to neutropenia  Cycle 2 gemcitabine/carboplatin  06/11/2018 (day 8 gemcitabine eliminated)  Cycle 3 gemcitabine/carboplatin 07/06/2018  Cycle 4 gemcitabine/carboplatin 07/27/2018  (Udenyca added)  Cycle 5 gemcitabine/carboplatin 08/17/2018 2. Ovarian cancer in 1988, low-grade adenocarcinoma with areas of "borderline" carcinoma, status post a hysterectomy and bilateral oophorectomy. 3. New onset atrial fibrillation August 2016, maintained on eliquis 4. Recurrent urinary tract infections      Disposition: [name] appears unchanged.  She has completed 4 cycles of adjuvant gemcitabine/carboplatin.  She will complete cycle 5 today.  [name] will return for an office visit and chemotherapy in 3 weeks. We prescribed Diflucan for a probable vaginal yeast infection.  We will check a urinalysis today.  [name], MD  08/17/2018  11:15 AM

## 2018-08-18 ENCOUNTER — Inpatient Hospital Stay: Payer: Medicare Other

## 2018-08-18 VITALS — BP 113/52 | HR 62 | Temp 98.1°F | Resp 17

## 2018-08-18 DIAGNOSIS — C569 Malignant neoplasm of unspecified ovary: Secondary | ICD-10-CM

## 2018-08-18 DIAGNOSIS — B373 Candidiasis of vulva and vagina: Secondary | ICD-10-CM | POA: Diagnosis not present

## 2018-08-18 DIAGNOSIS — Z5111 Encounter for antineoplastic chemotherapy: Secondary | ICD-10-CM | POA: Diagnosis not present

## 2018-08-18 DIAGNOSIS — N390 Urinary tract infection, site not specified: Secondary | ICD-10-CM | POA: Diagnosis not present

## 2018-08-18 DIAGNOSIS — C786 Secondary malignant neoplasm of retroperitoneum and peritoneum: Secondary | ICD-10-CM | POA: Diagnosis not present

## 2018-08-18 DIAGNOSIS — Z7689 Persons encountering health services in other specified circumstances: Secondary | ICD-10-CM | POA: Diagnosis not present

## 2018-08-18 MED ORDER — PEGFILGRASTIM-CBQV 6 MG/0.6ML ~~LOC~~ SOSY
PREFILLED_SYRINGE | SUBCUTANEOUS | Status: AC
  Filled 2018-08-18: qty 0.6

## 2018-08-18 MED ORDER — PEGFILGRASTIM-CBQV 6 MG/0.6ML ~~LOC~~ SOSY
6.0000 mg | PREFILLED_SYRINGE | Freq: Once | SUBCUTANEOUS | Status: AC
  Administered 2018-08-18: 6 mg via SUBCUTANEOUS

## 2018-08-19 LAB — URINE CULTURE: Culture: <10000 — AB

## 2018-08-24 ENCOUNTER — Other Ambulatory Visit: Payer: Self-pay | Admitting: Cardiology

## 2018-09-02 ENCOUNTER — Other Ambulatory Visit: Payer: Self-pay | Admitting: Oncology

## 2018-09-06 ENCOUNTER — Telehealth: Payer: Self-pay

## 2018-09-06 NOTE — Telephone Encounter (Signed)
Neulasta Onpro Patient Outreach Note  Patient was contacted on 09/06/2018 in regards to switching G-CSF therapy to Neulasta Onpro ([email or web address]). Patient was educated on the purpose of this proposed change in therapy due to COVID-19 pandemic. Patient was educated about Neulasta Onpro on-body injector and patient will be provided with an educational video while in infusion on their next scheduled date if changed to Neulasta Onpro.    []  Patient agrees to change in therapy. Begin process to change to Neulasta Onpro therapy.  [x]  Patient does not agree to change in therapy. No change to Neulasta Onpro at this time.    Thank You,  Kerri Perches  09/06/2018 1:18 PM //

## 2018-09-07 ENCOUNTER — Other Ambulatory Visit: Payer: Self-pay

## 2018-09-07 ENCOUNTER — Inpatient Hospital Stay: Payer: Medicare Other

## 2018-09-07 ENCOUNTER — Telehealth: Payer: Self-pay | Admitting: Oncology

## 2018-09-07 ENCOUNTER — Inpatient Hospital Stay (HOSPITAL_BASED_OUTPATIENT_CLINIC_OR_DEPARTMENT_OTHER): Payer: Medicare Other | Admitting: Oncology

## 2018-09-07 VITALS — BP 127/58 | HR 65 | Temp 97.8°F | Resp 17 | Ht 66.0 in | Wt 143.8 lb

## 2018-09-07 DIAGNOSIS — C569 Malignant neoplasm of unspecified ovary: Secondary | ICD-10-CM

## 2018-09-07 DIAGNOSIS — C44509 Unspecified malignant neoplasm of skin of other part of trunk: Secondary | ICD-10-CM

## 2018-09-07 DIAGNOSIS — N390 Urinary tract infection, site not specified: Secondary | ICD-10-CM

## 2018-09-07 DIAGNOSIS — B373 Candidiasis of vulva and vagina: Secondary | ICD-10-CM

## 2018-09-07 DIAGNOSIS — Z90722 Acquired absence of ovaries, bilateral: Secondary | ICD-10-CM | POA: Diagnosis not present

## 2018-09-07 DIAGNOSIS — R350 Frequency of micturition: Secondary | ICD-10-CM | POA: Diagnosis not present

## 2018-09-07 DIAGNOSIS — Z7901 Long term (current) use of anticoagulants: Secondary | ICD-10-CM | POA: Diagnosis not present

## 2018-09-07 DIAGNOSIS — C786 Secondary malignant neoplasm of retroperitoneum and peritoneum: Secondary | ICD-10-CM | POA: Diagnosis not present

## 2018-09-07 DIAGNOSIS — Z87440 Personal history of urinary (tract) infections: Secondary | ICD-10-CM

## 2018-09-07 DIAGNOSIS — Z90710 Acquired absence of both cervix and uterus: Secondary | ICD-10-CM | POA: Diagnosis not present

## 2018-09-07 DIAGNOSIS — Z5111 Encounter for antineoplastic chemotherapy: Secondary | ICD-10-CM | POA: Diagnosis not present

## 2018-09-07 DIAGNOSIS — Z7689 Persons encountering health services in other specified circumstances: Secondary | ICD-10-CM | POA: Diagnosis not present

## 2018-09-07 LAB — CBC WITH DIFFERENTIAL (CANCER CENTER ONLY)
Abs Immature Granulocytes: 0.03 10*3/uL (ref 0.00–0.07)
Basophils Absolute: 0 10*3/uL (ref 0.0–0.1)
Basophils Relative: 0 %
Eosinophils Absolute: 0 10*3/uL (ref 0.0–0.5)
Eosinophils Relative: 0 %
HCT: 30.4 % — ABNORMAL LOW (ref 36.0–46.0)
Hemoglobin: 9.4 g/dL — ABNORMAL LOW (ref 12.0–15.0)
Immature Granulocytes: 1 %
Lymphocytes Relative: 15 %
Lymphs Abs: 0.8 10*3/uL (ref 0.7–4.0)
MCH: 33.9 pg (ref 26.0–34.0)
MCHC: 30.9 g/dL (ref 30.0–36.0)
MCV: 109.7 fL — ABNORMAL HIGH (ref 80.0–100.0)
Monocytes Absolute: 1 10*3/uL (ref 0.1–1.0)
Monocytes Relative: 18 %
Neutro Abs: 3.6 10*3/uL (ref 1.7–7.7)
Neutrophils Relative %: 66 %
Platelet Count: 190 10*3/uL (ref 150–400)
RBC: 2.77 MIL/uL — ABNORMAL LOW (ref 3.87–5.11)
RDW: 17.9 % — ABNORMAL HIGH (ref 11.5–15.5)
WBC Count: 5.4 10*3/uL (ref 4.0–10.5)
nRBC: 0 % (ref 0.0–0.2)

## 2018-09-07 LAB — CMP (CANCER CENTER ONLY)
ALT: 20 U/L (ref 0–44)
AST: 18 U/L (ref 15–41)
Albumin: 3.7 g/dL (ref 3.5–5.0)
Alkaline Phosphatase: 76 U/L (ref 38–126)
Anion gap: 13 (ref 5–15)
BUN: 18 mg/dL (ref 8–23)
CO2: 23 mmol/L (ref 22–32)
Calcium: 9.6 mg/dL (ref 8.9–10.3)
Chloride: 106 mmol/L (ref 98–111)
Creatinine: 0.8 mg/dL (ref 0.44–1.00)
GFR, Est AFR Am: >60 mL/min (ref >60)
GFR, Estimated: >60 mL/min (ref >60)
Glucose, Bld: 87 mg/dL (ref 70–99)
Potassium: 4 mmol/L (ref 3.5–5.1)
Sodium: 142 mmol/L (ref 135–145)
Total Bilirubin: 0.3 mg/dL (ref 0.3–1.2)
Total Protein: 7.1 g/dL (ref 6.5–8.1)

## 2018-09-07 MED ORDER — PALONOSETRON HCL INJECTION 0.25 MG/5ML
0.2500 mg | Freq: Once | INTRAVENOUS | Status: AC
  Administered 2018-09-07: 10:00:00 0.25 mg via INTRAVENOUS

## 2018-09-07 MED ORDER — DEXAMETHASONE SODIUM PHOSPHATE 10 MG/ML IJ SOLN
INTRAMUSCULAR | Status: AC
  Filled 2018-09-07: qty 1

## 2018-09-07 MED ORDER — DEXAMETHASONE SODIUM PHOSPHATE 10 MG/ML IJ SOLN
10.0000 mg | Freq: Once | INTRAMUSCULAR | Status: AC
  Administered 2018-09-07: 10 mg via INTRAVENOUS

## 2018-09-07 MED ORDER — FAMOTIDINE IN NACL 20-0.9 MG/50ML-% IV SOLN
INTRAVENOUS | Status: AC
  Filled 2018-09-07: qty 50

## 2018-09-07 MED ORDER — SODIUM CHLORIDE 0.9 % IV SOLN
Freq: Once | INTRAVENOUS | Status: DC
  Filled 2018-09-07: qty 250

## 2018-09-07 MED ORDER — SODIUM CHLORIDE 0.9 % IV SOLN: 20.0000 mg | Freq: Once | INTRAVENOUS | Status: DC

## 2018-09-07 MED ORDER — LORATADINE 10 MG PO TABS
10.0000 mg | ORAL_TABLET | Freq: Once | ORAL | Status: AC
  Administered 2018-09-07: 10 mg via ORAL

## 2018-09-07 MED ORDER — FAMOTIDINE IN NACL 20-0.9 MG/50ML-% IV SOLN
20.0000 mg | Freq: Once | INTRAVENOUS | Status: AC
  Administered 2018-09-07: 10:00:00 20 mg via INTRAVENOUS

## 2018-09-07 MED ORDER — SODIUM CHLORIDE 0.9 % IV SOLN
Freq: Once | INTRAVENOUS | Status: AC
  Administered 2018-09-07: 10:00:00 via INTRAVENOUS
  Filled 2018-09-07: qty 250

## 2018-09-07 MED ORDER — LORATADINE 10 MG PO TABS
ORAL_TABLET | ORAL | Status: AC
  Filled 2018-09-07: qty 1

## 2018-09-07 MED ORDER — PALONOSETRON HCL INJECTION 0.25 MG/5ML
INTRAVENOUS | Status: AC
  Filled 2018-09-07: qty 5

## 2018-09-07 MED ORDER — SODIUM CHLORIDE 0.9 % IV SOLN
303.2000 mg | Freq: Once | INTRAVENOUS | Status: AC
  Administered 2018-09-07: 300 mg via INTRAVENOUS
  Filled 2018-09-07: qty 30

## 2018-09-07 MED ORDER — SODIUM CHLORIDE 0.9 % IV SOLN
800.0000 mg/m2 | Freq: Once | INTRAVENOUS | Status: AC
  Administered 2018-09-07: 1368 mg via INTRAVENOUS
  Filled 2018-09-07: qty 35.98

## 2018-09-07 NOTE — Patient Instructions (Signed)
[facility] Discharge Instructions for Patients Receiving Chemotherapy  Today you received the following chemotherapy agents Gemzar and Carboplatin   To help prevent nausea and vomiting after your treatment, we encourage you to take your nausea medication as directed.    If you develop nausea and vomiting that is not controlled by your nausea medication, call the clinic.   BELOW ARE SYMPTOMS THAT SHOULD BE REPORTED IMMEDIATELY:  *FEVER GREATER THAN 100.5 F  *CHILLS WITH OR WITHOUT FEVER  NAUSEA AND VOMITING THAT IS NOT CONTROLLED WITH YOUR NAUSEA MEDICATION  *UNUSUAL SHORTNESS OF BREATH  *UNUSUAL BRUISING OR BLEEDING  TENDERNESS IN MOUTH AND THROAT WITH OR WITHOUT PRESENCE OF ULCERS  *URINARY PROBLEMS  *BOWEL PROBLEMS  UNUSUAL RASH Items with * indicate a potential emergency and should be followed up as soon as possible.  Feel free to call the clinic should you have any questions or concerns. The clinic phone number is [phone].  Please show the CHEMO ALERT CARD at check-in to the Emergency Department and triage nurse.   

## 2018-09-07 NOTE — Telephone Encounter (Signed)
Scheduled appt per 4/24 los.

## 2018-09-07 NOTE — Progress Notes (Signed)
[facility] OFFICE PROGRESS NOTE   Diagnosis: Ovarian cancer  INTERVAL HISTORY:   [name] completed another cycle of gemcitabine/carboplatin on 08/17/2018.  She was treated for a urinary tract infection and vaginal yeast infection following the last cycle of chemotherapy.  Urinary frequency and pruritus have resolved.  No new complaint.  She continues to have intermittent discomfort in the low abdomen.  Objective:  Vital signs in last 24 hours:  Blood pressure (!) 127/58, pulse 65, temperature 97.8 F (36.6 C), temperature source Oral, resp. rate 17, height 5' 6"  (1.676 m), weight 143 lb 12.8 oz (65.2 kg), SpO2 100 %.    Physical examination-not performed today  Lab Results:  Lab Results  Component Value Date   WBC 5.4 09/07/2018   HGB 9.4 (L) 09/07/2018   HCT 30.4 (L) 09/07/2018   MCV 109.7 (H) 09/07/2018   PLT 190 09/07/2018   NEUTROABS 3.6 09/07/2018    CMP  Lab Results  Component Value Date   NA 140 08/17/2018   K 4.4 08/17/2018   CL 103 08/17/2018   CO2 28 08/17/2018   GLUCOSE 92 08/17/2018   BUN 15 08/17/2018   CREATININE 0.81 08/17/2018   CALCIUM 10.1 08/17/2018   PROT 7.4 08/17/2018   ALBUMIN 3.7 08/17/2018   AST 17 08/17/2018   ALT 15 08/17/2018   ALKPHOS 83 08/17/2018   BILITOT 0.3 08/17/2018   GFRNONAA >60 08/17/2018   GFRAA >60 08/17/2018     Medications: I have reviewed the patient's current medications.   Assessment/Plan: 1. 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   Elevated CA 125.   Cycle 1 Taxol/carboplatin 08/16/2013.   Cycle 2 Taxol/carboplatin 09/06/2013.   Cycle 3 Taxol/carboplatin 09/27/2013   CT 10/11/2013 with a stable abdominal wall mass.  Status post resection of abdominal wall mass with abdominal wall reconstruction 11/05/2013. Final pathology showed  adenocarcinoma. Specimen extensively involved with adenocarcinoma which focally involved the edge of the specimen. Immunostains and Foundation 1 testing were nonspecific for a primary tumor location.  Adjuvant radiation to the abdominal wall 01/16/2014 through 02/27/2014  CT 01/15/2018 while in the emergency room for evaluation of back pain-midline abdominal wall mass above the pubic symphysis and adjacent to the bladder  Ultrasound-guided biopsy of the abdominal wall mass 01/25/2018-adenocarcinoma similar to the 2015 biopsy  PET scan 03/01/2018-hypermetabolism associated with a cystic and solid suprapubic mass. Focal uptake identified in the left L3-4 facets without underlying bony lesion. No other sites of unexpected or suspicious hypermetabolism in the neck, chest, abdomen or pelvis.  Exploratory laparotomy, resection of abdominal wall tumor, oversew of bladder peritoneum, cystoscopy 03/28/2018(findings included a cystic and solid mass encountered below the lower edge of the incision extending into the space ofretziusand adherent to the pubic arch inferiorly, bladder dome posteriorly and prior abdominal wall repair anteriorly. No nodularity palpated in the tumor bed. Visualized bowel and omentum free of disease. Pelvic peritoneum appeared normal). A "small "amount of tumor on the periosteum was cauterized using a Bovie.  Pathology on pelvic tumor-metastatic high-grade adenocarcinoma, favor clear-cell carcinoma of gynecologic origin, fragments up to 5.8 cm in size; ER negative, PR positive, 1+ (weak staining) to focally 2+, 30-40%, MSI-stable, mutation burden-4,CREBBPalteration  Cycle 1 gemcitabine/carboplatin 05/21/2018; day 8 held due to neutropenia  Cycle 2 gemcitabine/carboplatin 06/11/2018 (day 8 gemcitabine eliminated)  Cycle 3 gemcitabine/carboplatin 07/06/2018  Cycle 4  gemcitabine/carboplatin 07/27/2018 (Udenyca added)  Cycle 5 gemcitabine/carboplatin 08/17/2018  Cycle 6  gemcitabine/carboplatin 09/07/2018 2. Ovarian cancer in 1988, low-grade adenocarcinoma with areas of "borderline" carcinoma, status post a hysterectomy and bilateral oophorectomy. 3. New onset atrial fibrillation August 2016, maintained on eliquis 4. Recurrent urinary tract infections   Disposition: [name] appears stable.  She will complete a final cycle of gemcitabine/carboplatin today.  She will return for an office visit and a restaging CT abdomen/pelvis in approximately 1 month.  She will contact us in the interim for new symptoms.  [name], MD  09/07/2018  9:01 AM

## 2018-09-08 ENCOUNTER — Inpatient Hospital Stay: Payer: Medicare Other

## 2018-09-08 ENCOUNTER — Other Ambulatory Visit: Payer: Self-pay

## 2018-09-08 VITALS — BP 107/53 | HR 52 | Temp 98.1°F | Resp 16

## 2018-09-08 DIAGNOSIS — C786 Secondary malignant neoplasm of retroperitoneum and peritoneum: Secondary | ICD-10-CM | POA: Diagnosis not present

## 2018-09-08 DIAGNOSIS — C44509 Unspecified malignant neoplasm of skin of other part of trunk: Secondary | ICD-10-CM

## 2018-09-08 DIAGNOSIS — N390 Urinary tract infection, site not specified: Secondary | ICD-10-CM | POA: Diagnosis not present

## 2018-09-08 DIAGNOSIS — B373 Candidiasis of vulva and vagina: Secondary | ICD-10-CM | POA: Diagnosis not present

## 2018-09-08 DIAGNOSIS — C569 Malignant neoplasm of unspecified ovary: Secondary | ICD-10-CM

## 2018-09-08 DIAGNOSIS — Z5111 Encounter for antineoplastic chemotherapy: Secondary | ICD-10-CM | POA: Diagnosis not present

## 2018-09-08 DIAGNOSIS — Z7689 Persons encountering health services in other specified circumstances: Secondary | ICD-10-CM | POA: Diagnosis not present

## 2018-09-08 LAB — CA 125: Cancer Antigen (CA) 125: 19.2 U/mL (ref 0.0–38.1)

## 2018-09-08 MED ORDER — PEGFILGRASTIM-CBQV 6 MG/0.6ML ~~LOC~~ SOSY
6.0000 mg | PREFILLED_SYRINGE | Freq: Once | SUBCUTANEOUS | Status: AC
  Administered 2018-09-08: 6 mg via SUBCUTANEOUS

## 2018-09-08 MED ORDER — PEGFILGRASTIM-CBQV 6 MG/0.6ML ~~LOC~~ SOSY
PREFILLED_SYRINGE | SUBCUTANEOUS | Status: AC
  Filled 2018-09-08: qty 0.6

## 2018-09-08 NOTE — Patient Instructions (Signed)
Pegfilgrastim injection  What is this medicine?  PEGFILGRASTIM (PEG fil gra stim) is a long-acting granulocyte colony-stimulating factor that stimulates the growth of neutrophils, a type of white blood cell important in the body's fight against infection. It is used to reduce the incidence of fever and infection in patients with certain types of cancer who are receiving chemotherapy that affects the bone marrow, and to increase survival after being exposed to high doses of radiation.  This medicine may be used for other purposes; ask your health care provider or pharmacist if you have questions.  COMMON BRAND NAME(S): Fulphila, Neulasta, UDENYCA  What should I tell my health care provider before I take this medicine?  They need to know if you have any of these conditions:  -kidney disease  -latex allergy  -ongoing radiation therapy  -sickle cell disease  -skin reactions to acrylic adhesives (On-Body Injector only)  -an unusual or allergic reaction to pegfilgrastim, filgrastim, other medicines, foods, dyes, or preservatives  -pregnant or trying to get pregnant  -breast-feeding  How should I use this medicine?  This medicine is for injection under the skin. If you get this medicine at home, you will be taught how to prepare and give the pre-filled syringe or how to use the On-body Injector. Refer to the patient Instructions for Use for detailed instructions. Use exactly as directed. Tell your healthcare provider immediately if you suspect that the On-body Injector may not have performed as intended or if you suspect the use of the On-body Injector resulted in a missed or partial dose.  It is important that you put your used needles and syringes in a special sharps container. Do not put them in a trash can. If you do not have a sharps container, call your pharmacist or healthcare provider to get one.  Talk to your pediatrician regarding the use of this medicine in children. While this drug may be prescribed for  selected conditions, precautions do apply.  Overdosage: If you think you have taken too much of this medicine contact a poison control center or emergency room at once.  NOTE: This medicine is only for you. Do not share this medicine with others.  What if I miss a dose?  It is important not to miss your dose. Call your doctor or health care professional if you miss your dose. If you miss a dose due to an On-body Injector failure or leakage, a new dose should be administered as soon as possible using a single prefilled syringe for manual use.  What may interact with this medicine?  Interactions have not been studied.  Give your health care provider a list of all the medicines, herbs, non-prescription drugs, or dietary supplements you use. Also tell them if you smoke, drink alcohol, or use illegal drugs. Some items may interact with your medicine.  This list may not describe all possible interactions. Give your health care provider a list of all the medicines, herbs, non-prescription drugs, or dietary supplements you use. Also tell them if you smoke, drink alcohol, or use illegal drugs. Some items may interact with your medicine.  What should I watch for while using this medicine?  You may need blood work done while you are taking this medicine.  If you are going to need a MRI, CT scan, or other procedure, tell your doctor that you are using this medicine (On-Body Injector only).  What side effects may I notice from receiving this medicine?  Side effects that you should report to   your doctor or health care professional as soon as possible:  -allergic reactions like skin rash, itching or hives, swelling of the face, lips, or tongue  -back pain  -dizziness  -fever  -pain, redness, or irritation at site where injected  -pinpoint red spots on the skin  -red or dark-brown urine  -shortness of breath or breathing problems  -stomach or side pain, or pain at the shoulder  -swelling  -tiredness  -trouble passing urine or  change in the amount of urine  Side effects that usually do not require medical attention (report to your doctor or health care professional if they continue or are bothersome):  -bone pain  -muscle pain  This list may not describe all possible side effects. Call your doctor for medical advice about side effects. You may report side effects to FDA at 1-800-FDA-1088.  Where should I keep my medicine?  Keep out of the reach of children.  If you are using this medicine at home, you will be instructed on how to store it. Throw away any unused medicine after the expiration date on the label.  NOTE: This sheet is a summary. It may not cover all possible information. If you have questions about this medicine, talk to your doctor, pharmacist, or health care provider.   2019 [employer]vier/[employer] Standard (2017-08-07 16:57:08)

## 2018-09-13 ENCOUNTER — Other Ambulatory Visit: Payer: Self-pay | Admitting: Oncology

## 2018-09-13 ENCOUNTER — Other Ambulatory Visit: Payer: Self-pay | Admitting: Nurse Practitioner

## 2018-09-13 DIAGNOSIS — C569 Malignant neoplasm of unspecified ovary: Secondary | ICD-10-CM

## 2018-09-13 MED ORDER — HYDROCODONE-ACETAMINOPHEN 5-325 MG PO TABS: 1.0000 | ORAL_TABLET | Freq: Three times a day (TID) | ORAL | 0 refills | Status: DC | PRN

## 2018-10-09 DIAGNOSIS — E039 Hypothyroidism, unspecified: Secondary | ICD-10-CM | POA: Diagnosis not present

## 2018-10-11 DIAGNOSIS — E039 Hypothyroidism, unspecified: Secondary | ICD-10-CM | POA: Diagnosis not present

## 2018-10-19 ENCOUNTER — Inpatient Hospital Stay: Payer: Medicare Other | Attending: Oncology

## 2018-10-19 ENCOUNTER — Telehealth: Payer: Self-pay | Admitting: Oncology

## 2018-10-19 ENCOUNTER — Encounter (HOSPITAL_COMMUNITY): Payer: Self-pay

## 2018-10-19 ENCOUNTER — Inpatient Hospital Stay (HOSPITAL_BASED_OUTPATIENT_CLINIC_OR_DEPARTMENT_OTHER): Payer: Medicare Other | Admitting: Oncology

## 2018-10-19 ENCOUNTER — Ambulatory Visit (HOSPITAL_COMMUNITY)
Admission: RE | Admit: 2018-10-19 | Discharge: 2018-10-19 | Disposition: A | Discharge status: Home / Self Care | Payer: Medicare Other | Source: Ambulatory Visit | Attending: Oncology | Admitting: Oncology

## 2018-10-19 ENCOUNTER — Other Ambulatory Visit: Payer: Self-pay

## 2018-10-19 VITALS — BP 140/61 | HR 93 | Temp 98.3°F | Resp 18 | Ht 66.0 in | Wt 140.3 lb

## 2018-10-19 DIAGNOSIS — I700 Atherosclerosis of aorta: Secondary | ICD-10-CM

## 2018-10-19 DIAGNOSIS — C792 Secondary malignant neoplasm of skin: Secondary | ICD-10-CM | POA: Diagnosis not present

## 2018-10-19 DIAGNOSIS — Z87442 Personal history of urinary calculi: Secondary | ICD-10-CM | POA: Insufficient documentation

## 2018-10-19 DIAGNOSIS — K5730 Diverticulosis of large intestine without perforation or abscess without bleeding: Secondary | ICD-10-CM | POA: Diagnosis not present

## 2018-10-19 DIAGNOSIS — Z90722 Acquired absence of ovaries, bilateral: Secondary | ICD-10-CM | POA: Insufficient documentation

## 2018-10-19 DIAGNOSIS — Z90710 Acquired absence of both cervix and uterus: Secondary | ICD-10-CM | POA: Diagnosis not present

## 2018-10-19 DIAGNOSIS — I4891 Unspecified atrial fibrillation: Secondary | ICD-10-CM

## 2018-10-19 DIAGNOSIS — C44509 Unspecified malignant neoplasm of skin of other part of trunk: Secondary | ICD-10-CM | POA: Insufficient documentation

## 2018-10-19 DIAGNOSIS — C569 Malignant neoplasm of unspecified ovary: Secondary | ICD-10-CM | POA: Insufficient documentation

## 2018-10-19 DIAGNOSIS — Z7901 Long term (current) use of anticoagulants: Secondary | ICD-10-CM | POA: Diagnosis not present

## 2018-10-19 LAB — CBC WITH DIFFERENTIAL (CANCER CENTER ONLY)
Abs Immature Granulocytes: 0.01 10*3/uL (ref 0.00–0.07)
Basophils Absolute: 0 10*3/uL (ref 0.0–0.1)
Basophils Relative: 1 %
Eosinophils Absolute: 0 10*3/uL (ref 0.0–0.5)
Eosinophils Relative: 1 %
HCT: 34.5 % — ABNORMAL LOW (ref 36.0–46.0)
Hemoglobin: 11.1 g/dL — ABNORMAL LOW (ref 12.0–15.0)
Immature Granulocytes: 0 %
Lymphocytes Relative: 27 %
Lymphs Abs: 1 10*3/uL (ref 0.7–4.0)
MCH: 34.7 pg — ABNORMAL HIGH (ref 26.0–34.0)
MCHC: 32.2 g/dL (ref 30.0–36.0)
MCV: 107.8 fL — ABNORMAL HIGH (ref 80.0–100.0)
Monocytes Absolute: 0.6 10*3/uL (ref 0.1–1.0)
Monocytes Relative: 16 %
Neutro Abs: 2 10*3/uL (ref 1.7–7.7)
Neutrophils Relative %: 55 %
Platelet Count: 180 10*3/uL (ref 150–400)
RBC: 3.2 MIL/uL — ABNORMAL LOW (ref 3.87–5.11)
RDW: 14.2 % (ref 11.5–15.5)
WBC Count: 3.6 10*3/uL — ABNORMAL LOW (ref 4.0–10.5)
nRBC: 0 % (ref 0.0–0.2)

## 2018-10-19 LAB — CMP (CANCER CENTER ONLY)
ALT: 18 U/L (ref 0–44)
AST: 22 U/L (ref 15–41)
Albumin: 4 g/dL (ref 3.5–5.0)
Alkaline Phosphatase: 53 U/L (ref 38–126)
Anion gap: 12 (ref 5–15)
BUN: 16 mg/dL (ref 8–23)
CO2: 25 mmol/L (ref 22–32)
Calcium: 9.6 mg/dL (ref 8.9–10.3)
Chloride: 104 mmol/L (ref 98–111)
Creatinine: 0.85 mg/dL (ref 0.44–1.00)
GFR, Est AFR Am: >60 mL/min (ref >60)
GFR, Estimated: >60 mL/min (ref >60)
Glucose, Bld: 93 mg/dL (ref 70–99)
Potassium: 3.9 mmol/L (ref 3.5–5.1)
Sodium: 141 mmol/L (ref 135–145)
Total Bilirubin: 0.4 mg/dL (ref 0.3–1.2)
Total Protein: 6.9 g/dL (ref 6.5–8.1)

## 2018-10-19 MED ORDER — IOHEXOL 300 MG/ML  SOLN
100.0000 mL | Freq: Once | INTRAMUSCULAR | Status: AC | PRN
  Administered 2018-10-19: 100 mL via INTRAVENOUS

## 2018-10-19 MED ORDER — SODIUM CHLORIDE (PF) 0.9 % IJ SOLN
INTRAMUSCULAR | Status: AC
  Filled 2018-10-19: qty 50

## 2018-10-19 NOTE — Telephone Encounter (Signed)
Scheduled appt per 6/5 los. A calendar will be mailed out. °

## 2018-10-19 NOTE — Progress Notes (Signed)
[facility] OFFICE PROGRESS NOTE   Diagnosis: Ovarian cancer  INTERVAL HISTORY:   [name] returns as scheduled.  She feels well.  Occasional mild discomfort at the region of the low abdominal scar.  No bleeding.  Good appetite and energy level.  Objective:  Vital signs in last 24 hours:  Blood pressure 140/61, pulse 93, temperature 98.3 F (36.8 C), temperature source Oral, resp. rate 18, height 5' 6"  (1.676 m), weight 140 lb 4.8 oz (63.6 kg), SpO2 100 %.    Physical examination not performed today secondary to distancing with the COVID pandemic  Lab Results:  Lab Results  Component Value Date   WBC 3.6 (L) 10/19/2018   HGB 11.1 (L) 10/19/2018   HCT 34.5 (L) 10/19/2018   MCV 107.8 (H) 10/19/2018   PLT 180 10/19/2018   NEUTROABS 2.0 10/19/2018    CMP  Lab Results  Component Value Date   NA 141 10/19/2018   K 3.9 10/19/2018   CL 104 10/19/2018   CO2 25 10/19/2018   GLUCOSE 93 10/19/2018   BUN 16 10/19/2018   CREATININE 0.85 10/19/2018   CALCIUM 9.6 10/19/2018   PROT 6.9 10/19/2018   ALBUMIN 4.0 10/19/2018   AST 22 10/19/2018   ALT 18 10/19/2018   ALKPHOS 53 10/19/2018   BILITOT 0.4 10/19/2018   GFRNONAA >60 10/19/2018   GFRAA >60 10/19/2018    Imaging:  Ct Abdomen Pelvis W Contrast  Result Date: 10/19/2018 CLINICAL DATA:  73 year old female with history of metastatic adenocarcinoma and lower abdominal wall mass. Initially treated with surgical resection, radiation therapy and chemotherapy, with recurrence in November 2019 followed by surgical resection and chemotherapy. Restaging examination. EXAM: CT ABDOMEN AND PELVIS WITH CONTRAST TECHNIQUE: Multidetector CT imaging of the abdomen and pelvis was performed using the standard protocol following bolus administration of intravenous contrast. CONTRAST:  170m OMNIPAQUE IOHEXOL 300 MG/ML  SOLN COMPARISON:  CT the abdomen and pelvis 01/15/2018. PET-CT 03/01/2018. FINDINGS: Lower chest:  Unremarkable. Hepatobiliary: No suspicious cystic or solid hepatic lesions. Status post cholecystectomy. Mild intra and extrahepatic biliary ductal dilatation, likely reflective of benign post cholecystectomy physiology. No definite calcification in the common bile duct to suggest retained choledocholithiasis. Pancreas: No pancreatic mass. No pancreatic ductal dilatation. No pancreatic or peripancreatic fluid or inflammatory changes. Spleen: Unremarkable. Adrenals/Urinary Tract: 2.3 cm low-attenuation lesion in the medial aspect of the upper pole the left kidney, compatible with a simple cyst. Right kidney and bilateral adrenal glands are normal in appearance. No hydroureteronephrosis. Urinary bladder is normal in appearance. Stomach/Bowel: Normal appearance of the stomach. No pathologic dilatation of small bowel or colon. Numerous colonic diverticulae are noted, particularly in the sigmoid colon, without surrounding inflammatory changes to suggest an acute diverticulitis at this time. The appendix is not confidently identified and may be surgically absent. Regardless, there are no inflammatory changes noted adjacent to the cecum to suggest the presence of an acute appendicitis at this time. Vascular/Lymphatic: Aortic atherosclerosis, without evidence of aneurysm or dissection in the abdominal or pelvic vasculature. No lymphadenopathy noted in the abdomen or pelvis. Reproductive: Status post hysterectomy. Ovaries are not confidently identified may be surgically absent or atrophic. Other: No significant volume of ascites.  No pneumoperitoneum. Musculoskeletal: When compared to the prior examination the previously noted soft tissue thickening in the inferior aspect of the lower anterior abdominal wall is no longer identified, presumably surgically resected. Slightly more cephalad in the anatomic pelvis there is some soft tissue thickening in the lower anterior abdominal  wall best appreciated on axial image 54 of  series 2 and sagittal image 54 of series 5. There are no aggressive appearing lytic or blastic lesions noted in the visualized portions of the skeleton. IMPRESSION: 1. Previously noted lower anterior abdominal wall mass is no longer identified, presumably surgically resected. Cephalad to the site of the prior mass within the low anterior abdominal wall there is some soft tissue thickening seen on today's examination (axial image 54 of series 2). This is nonspecific and will serve as a baseline for future follow-up examinations. 2. No signs of metastatic disease elsewhere in the abdomen or pelvis. 3. Colonic diverticulosis without evidence of acute diverticulitis at this time. 4. Aortic atherosclerosis. 5. Additional incidental findings, as above. Electronically Signed   By: Vinnie Langton M.D.   On: 10/19/2018 11:26    Medications: I have reviewed the patient's current medications.   Assessment/Plan: 1. 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   Elevated CA 125.   Cycle 1 Taxol/carboplatin 08/16/2013.   Cycle 2 Taxol/carboplatin 09/06/2013.   Cycle 3 Taxol/carboplatin 09/27/2013   CT 10/11/2013 with a stable abdominal wall mass.  Status post resection of abdominal wall mass with abdominal wall reconstruction 11/05/2013. Final pathology showed adenocarcinoma. Specimen extensively involved with adenocarcinoma which focally involved the edge of the specimen. Immunostains and Foundation 1 testing were nonspecific for a primary tumor location.  Adjuvant radiation to the abdominal wall 01/16/2014 through 02/27/2014  CT 01/15/2018 while in the emergency room for evaluation of back pain-midline abdominal wall mass above the pubic symphysis and adjacent to the bladder  Ultrasound-guided biopsy of the abdominal wall mass 01/25/2018-adenocarcinoma similar  to the 2015 biopsy  PET scan 03/01/2018-hypermetabolism associated with a cystic and solid suprapubic mass. Focal uptake identified in the left L3-4 facets without underlying bony lesion. No other sites of unexpected or suspicious hypermetabolism in the neck, chest, abdomen or pelvis.  Exploratory laparotomy, resection of abdominal wall tumor, oversew of bladder peritoneum, cystoscopy 03/28/2018(findings included a cystic and solid mass encountered below the lower edge of the incision extending into the space ofretziusand adherent to the pubic arch inferiorly, bladder dome posteriorly and prior abdominal wall repair anteriorly. No nodularity palpated in the tumor bed. Visualized bowel and omentum free of disease. Pelvic peritoneum appeared normal). A "small "amount of tumor on the periosteum was cauterized using a Bovie.  Pathology on pelvic tumor-metastatic high-grade adenocarcinoma, favor clear-cell carcinoma of gynecologic origin, fragments up to 5.8 cm in size; ER negative, PR positive, 1+ (weak staining) to focally 2+, 30-40%, MSI-stable, mutation burden-4,CREBBPalteration  Cycle 1 gemcitabine/carboplatin 05/21/2018; day 8 held due to neutropenia  Cycle 2 gemcitabine/carboplatin 06/11/2018 (day 8 gemcitabine eliminated)  Cycle 3 gemcitabine/carboplatin 07/06/2018  Cycle 4 gemcitabine/carboplatin 07/27/2018 (Udenyca added)  Cycle 5 gemcitabine/carboplatin 08/17/2018  Cycle 6 gemcitabine/carboplatin 09/07/2018   CT 10/19/2018- low anterior abdominal wall mass no longer identified, soft tissue thickening cephalad to the site of prior mass-nonspecific, no additional evidence of metastatic disease 2. Ovarian cancer in 1988, low-grade adenocarcinoma with areas of "borderline" carcinoma, status post a hysterectomy and bilateral oophorectomy. 3. New onset atrial fibrillation August 2016, maintained on eliquis 4. Recurrent urinary tract infections  Disposition: Ms. Bigos is in remission from  ovarian cancer.  I suspect the abdominal wall thickening is related to postsurgical change.  She will return for an office visit and Ca1 25 in  3 months.  She will contact us in the interim for new symptoms.  I reviewed the CT images with Ms. Mancel Bale.  [name], MD  10/19/2018  2:59 PM

## 2018-10-20 LAB — CA 125: Cancer Antigen (CA) 125: 14.4 U/mL (ref 0.0–38.1)

## 2018-10-22 ENCOUNTER — Telehealth: Payer: Self-pay

## 2018-10-22 NOTE — Telephone Encounter (Signed)
-----   Message from Ladell Pier, MD sent at 10/21/2018  1:58 PM EDT ----- Please call patient, ca125 is normal, f/u as scheduled

## 2018-10-22 NOTE — Telephone Encounter (Signed)
Spoke with pt advised ca125 is normal per Dr Benay Spice and to f/u as scheduled. Pt verbalized understanding.

## 2018-10-24 DIAGNOSIS — F419 Anxiety disorder, unspecified: Secondary | ICD-10-CM | POA: Diagnosis not present

## 2018-10-24 DIAGNOSIS — Z6822 Body mass index (BMI) 22.0-22.9, adult: Secondary | ICD-10-CM | POA: Diagnosis not present

## 2018-10-24 DIAGNOSIS — Z1389 Encounter for screening for other disorder: Secondary | ICD-10-CM | POA: Diagnosis not present

## 2018-10-24 DIAGNOSIS — G2581 Restless legs syndrome: Secondary | ICD-10-CM | POA: Diagnosis not present

## 2018-10-24 DIAGNOSIS — I4891 Unspecified atrial fibrillation: Secondary | ICD-10-CM | POA: Diagnosis not present

## 2019-01-07 ENCOUNTER — Other Ambulatory Visit: Payer: Self-pay | Admitting: Cardiology

## 2019-01-17 ENCOUNTER — Inpatient Hospital Stay: Payer: Medicare Other | Attending: Oncology | Admitting: Oncology

## 2019-01-17 ENCOUNTER — Telehealth: Payer: Self-pay | Admitting: Oncology

## 2019-01-17 ENCOUNTER — Inpatient Hospital Stay: Payer: Medicare Other

## 2019-01-17 ENCOUNTER — Other Ambulatory Visit: Payer: Self-pay

## 2019-01-17 VITALS — BP 138/67 | HR 53 | Temp 98.5°F | Resp 17 | Ht 66.0 in | Wt 139.1 lb

## 2019-01-17 DIAGNOSIS — I4891 Unspecified atrial fibrillation: Secondary | ICD-10-CM | POA: Diagnosis not present

## 2019-01-17 DIAGNOSIS — C569 Malignant neoplasm of unspecified ovary: Secondary | ICD-10-CM

## 2019-01-17 DIAGNOSIS — Z923 Personal history of irradiation: Secondary | ICD-10-CM | POA: Insufficient documentation

## 2019-01-17 DIAGNOSIS — Z8543 Personal history of malignant neoplasm of ovary: Secondary | ICD-10-CM | POA: Insufficient documentation

## 2019-01-17 DIAGNOSIS — Z87440 Personal history of urinary (tract) infections: Secondary | ICD-10-CM | POA: Insufficient documentation

## 2019-01-17 DIAGNOSIS — G2581 Restless legs syndrome: Secondary | ICD-10-CM | POA: Insufficient documentation

## 2019-01-17 DIAGNOSIS — Z9221 Personal history of antineoplastic chemotherapy: Secondary | ICD-10-CM | POA: Insufficient documentation

## 2019-01-17 DIAGNOSIS — C44509 Unspecified malignant neoplasm of skin of other part of trunk: Secondary | ICD-10-CM | POA: Diagnosis not present

## 2019-01-17 MED ORDER — HYDROCODONE-ACETAMINOPHEN 5-325 MG PO TABS: 1.0000 | ORAL_TABLET | Freq: Three times a day (TID) | ORAL | 0 refills | Status: DC | PRN

## 2019-01-17 NOTE — Progress Notes (Signed)
[place] OFFICE PROGRESS NOTE   Diagnosis: Ovarian cancer  INTERVAL HISTORY:   [name] returns as scheduled.  She feels well.  Good appetite.  No difficulty with bowel function.  She continues to have restless legs at night.  Persistent "swelling "of the low abdomen.  Objective:  Vital signs in last 24 hours:  Blood pressure 138/67, pulse (!) 53, temperature 98.5 F (36.9 C), temperature source Temporal, resp. rate 17, height 5' 6"  (1.676 m), weight 139 lb 1.6 oz (63.1 kg), SpO2 100 %.    Limited physical examination secondary to distancing with the COVID pandemic GI: No mass, nontender, no hepatosplenomegaly, no apparent ascites, soft reducible fullness in the left greater than right suprapubic area Vascular: No leg edema  Lab Results:  Lab Results  Component Value Date   WBC 3.6 (L) 10/19/2018   HGB 11.1 (L) 10/19/2018   HCT 34.5 (L) 10/19/2018   MCV 107.8 (H) 10/19/2018   PLT 180 10/19/2018   NEUTROABS 2.0 10/19/2018    CMP  Lab Results  Component Value Date   NA 141 10/19/2018   K 3.9 10/19/2018   CL 104 10/19/2018   CO2 25 10/19/2018   GLUCOSE 93 10/19/2018   BUN 16 10/19/2018   CREATININE 0.85 10/19/2018   CALCIUM 9.6 10/19/2018   PROT 6.9 10/19/2018   ALBUMIN 4.0 10/19/2018   AST 22 10/19/2018   ALT 18 10/19/2018   ALKPHOS 53 10/19/2018   BILITOT 0.4 10/19/2018   GFRNONAA >60 10/19/2018   GFRAA >60 10/19/2018     Medications: I have reviewed the patient's current medications.   Assessment/Plan:  1. 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   Elevated CA 125.   Cycle 1 Taxol/carboplatin 08/16/2013.   Cycle 2 Taxol/carboplatin 09/06/2013.   Cycle 3 Taxol/carboplatin 09/27/2013   CT 10/11/2013 with a stable abdominal wall mass.  Status post resection of abdominal wall mass  with abdominal wall reconstruction 11/05/2013. Final pathology showed adenocarcinoma. Specimen extensively involved with adenocarcinoma which focally involved the edge of the specimen. Immunostains and Foundation 1 testing were nonspecific for a primary tumor location.  Adjuvant radiation to the abdominal wall 01/16/2014 through 02/27/2014  CT 01/15/2018 while in the emergency room for evaluation of back pain-midline abdominal wall mass above the pubic symphysis and adjacent to the bladder  Ultrasound-guided biopsy of the abdominal wall mass 01/25/2018-adenocarcinoma similar to the 2015 biopsy  PET scan 03/01/2018-hypermetabolism associated with a cystic and solid suprapubic mass. Focal uptake identified in the left L3-4 facets without underlying bony lesion. No other sites of unexpected or suspicious hypermetabolism in the neck, chest, abdomen or pelvis.  Exploratory laparotomy, resection of abdominal wall tumor, oversew of bladder peritoneum, cystoscopy 03/28/2018(findings included a cystic and solid mass encountered below the lower edge of the incision extending into the space ofretziusand adherent to the pubic arch inferiorly, bladder dome posteriorly and prior abdominal wall repair anteriorly. No nodularity palpated in the tumor bed. Visualized bowel and omentum free of disease. Pelvic peritoneum appeared normal). A "small "amount of tumor on the periosteum was cauterized using a Bovie.  Pathology on pelvic tumor-metastatic high-grade adenocarcinoma, favor clear-cell carcinoma of gynecologic origin, fragments up to 5.8 cm in size; ER negative, PR positive, 1+ (weak staining) to focally 2+, 30-40%, MSI-stable, mutation burden-4,CREBBPalteration  Cycle 1 gemcitabine/carboplatin 05/21/2018; day 8 held due to neutropenia  Cycle 2 gemcitabine/carboplatin 06/11/2018 (day 8 gemcitabine eliminated)  Cycle 3 gemcitabine/carboplatin 07/06/2018  Cycle 4 gemcitabine/carboplatin 07/27/2018 (Udenyca  added)  Cycle 5 gemcitabine/carboplatin 08/17/2018  Cycle 6 gemcitabine/carboplatin 09/07/2018   CT 10/19/2018- low anterior abdominal wall mass no longer identified, soft tissue thickening cephalad to the site of prior mass-nonspecific, no additional evidence of metastatic disease 2. Ovarian cancer in 1988, low-grade adenocarcinoma with areas of "borderline" carcinoma, status post a hysterectomy and bilateral oophorectomy. 3. New onset atrial fibrillation August 2016, maintained on eliquis 4. Recurrent urinary tract infections   Disposition: [name] is in clinical remission from the gynecological Legacy.  We will follow-up on the Ca125 from today.  She will return for an office visit and restaging CT in 3 months. The soft fullness at the low abdomen may be a surgery related hernia.  I refilled her prescription for hydrocodone.  [name], MD  01/17/2019  1:04 PM

## 2019-01-17 NOTE — Telephone Encounter (Signed)
Gave avs and calendar. Patient will come back and get contrast at a later date

## 2019-01-18 ENCOUNTER — Other Ambulatory Visit: Payer: Medicare Other

## 2019-01-18 ENCOUNTER — Telehealth: Payer: Self-pay | Admitting: *Deleted

## 2019-01-18 ENCOUNTER — Ambulatory Visit: Payer: Medicare Other | Admitting: Oncology

## 2019-01-18 DIAGNOSIS — H5213 Myopia, bilateral: Secondary | ICD-10-CM | POA: Diagnosis not present

## 2019-01-18 DIAGNOSIS — H524 Presbyopia: Secondary | ICD-10-CM | POA: Diagnosis not present

## 2019-01-18 DIAGNOSIS — H52222 Regular astigmatism, left eye: Secondary | ICD-10-CM | POA: Diagnosis not present

## 2019-01-18 DIAGNOSIS — H25099 Other age-related incipient cataract, unspecified eye: Secondary | ICD-10-CM | POA: Diagnosis not present

## 2019-01-18 LAB — CA 125: Cancer Antigen (CA) 125: 18.1 U/mL (ref 0.0–38.1)

## 2019-01-18 NOTE — Telephone Encounter (Signed)
Notified of normal CA 125 results.

## 2019-01-18 NOTE — Telephone Encounter (Signed)
-----   Message from Ladell Pier, MD sent at 01/18/2019  9:37 AM EDT ----- Please call patient, the Ca1 25 is normal, follow-up as scheduled

## 2019-01-18 NOTE — Telephone Encounter (Signed)
Per Dr. Benay Spice, called pt regarding Ca125 normal results. No answer or vmail to leave message. Alternate number has busy signal. Will retry contacting pt.

## 2019-01-28 ENCOUNTER — Other Ambulatory Visit: Payer: Self-pay | Admitting: Oncology

## 2019-02-12 DIAGNOSIS — E049 Nontoxic goiter, unspecified: Secondary | ICD-10-CM | POA: Diagnosis not present

## 2019-02-13 DIAGNOSIS — E782 Mixed hyperlipidemia: Secondary | ICD-10-CM | POA: Diagnosis not present

## 2019-02-13 DIAGNOSIS — I4891 Unspecified atrial fibrillation: Secondary | ICD-10-CM | POA: Diagnosis not present

## 2019-02-15 DIAGNOSIS — E039 Hypothyroidism, unspecified: Secondary | ICD-10-CM | POA: Diagnosis not present

## 2019-02-15 DIAGNOSIS — C799 Secondary malignant neoplasm of unspecified site: Secondary | ICD-10-CM | POA: Diagnosis not present

## 2019-02-15 DIAGNOSIS — G2581 Restless legs syndrome: Secondary | ICD-10-CM | POA: Diagnosis not present

## 2019-02-15 DIAGNOSIS — Z6822 Body mass index (BMI) 22.0-22.9, adult: Secondary | ICD-10-CM | POA: Diagnosis not present

## 2019-02-15 DIAGNOSIS — Z23 Encounter for immunization: Secondary | ICD-10-CM | POA: Diagnosis not present

## 2019-02-15 DIAGNOSIS — I4891 Unspecified atrial fibrillation: Secondary | ICD-10-CM | POA: Diagnosis not present

## 2019-02-15 DIAGNOSIS — E7849 Other hyperlipidemia: Secondary | ICD-10-CM | POA: Diagnosis not present

## 2019-02-15 DIAGNOSIS — Z0000 Encounter for general adult medical examination without abnormal findings: Secondary | ICD-10-CM | POA: Diagnosis not present

## 2019-02-15 DIAGNOSIS — E559 Vitamin D deficiency, unspecified: Secondary | ICD-10-CM | POA: Diagnosis not present

## 2019-02-15 DIAGNOSIS — E063 Autoimmune thyroiditis: Secondary | ICD-10-CM | POA: Diagnosis not present

## 2019-02-15 DIAGNOSIS — Z1389 Encounter for screening for other disorder: Secondary | ICD-10-CM | POA: Diagnosis not present

## 2019-02-15 DIAGNOSIS — F419 Anxiety disorder, unspecified: Secondary | ICD-10-CM | POA: Diagnosis not present

## 2019-02-22 ENCOUNTER — Telehealth: Payer: Self-pay | Admitting: Cardiology

## 2019-02-22 MED ORDER — APIXABAN 5 MG PO TABS: ORAL_TABLET | ORAL | 0 refills | Status: DC

## 2019-02-22 NOTE — Telephone Encounter (Signed)
Patient aware that samples available for pick up.

## 2019-02-22 NOTE — Telephone Encounter (Signed)
Patient calling the office for samples of medication:   1.  What medication and dosage are you requesting samples for?    Eliquis 5mg    2.  Are you currently out of this medication?

## 2019-03-07 ENCOUNTER — Other Ambulatory Visit: Payer: Self-pay | Admitting: *Deleted

## 2019-03-07 DIAGNOSIS — Z20822 Contact with and (suspected) exposure to covid-19: Secondary | ICD-10-CM

## 2019-03-08 ENCOUNTER — Telehealth: Payer: Self-pay

## 2019-03-08 ENCOUNTER — Telehealth: Payer: Self-pay | Admitting: *Deleted

## 2019-03-08 NOTE — Telephone Encounter (Signed)
Pt called looking for covid-19 test results. No results available at this time.

## 2019-03-08 NOTE — Telephone Encounter (Signed)
Patient called stating that she was tested yesterday for COVID-19. She wants to go visit her sister because her nephew died. She was told her test was not resulted.  She will call back

## 2019-03-10 LAB — NOVEL CORONAVIRUS, NAA: SARS-CoV-2, NAA: NOT DETECTED

## 2019-03-11 ENCOUNTER — Telehealth: Payer: Self-pay

## 2019-03-11 NOTE — Telephone Encounter (Signed)
Patient called to get her COVID-19 test result.Marland Kitchen  She was told her test had not yet resulted but is seen as active.  She will call back again.

## 2019-03-12 ENCOUNTER — Telehealth: Payer: Self-pay | Admitting: *Deleted

## 2019-03-12 NOTE — Telephone Encounter (Signed)
Pt called in requesting COVID-19 test result however there is an order but no mention of receiving the specimen.   It was done 03/07/2019 at [facility].  I let the pt know we would follow up on this. She stated,   "I'm not having any symptoms" and thanked me for looking into it.

## 2019-03-13 ENCOUNTER — Telehealth: Payer: Self-pay | Admitting: *Deleted

## 2019-03-13 NOTE — Telephone Encounter (Signed)
Pt called in again today inquiring about her COVID-19 test result that was taken on 03/07/2019 at [facility].    Her result is not back yet.  I let her know there was an issue with the computer system the day she was tested and they are manually entering the orders.   They are in the process of being resulted.  I let her know someone would be calling her once they were resulted.  I offered her MyChart sign up however she does not have a computer.  She thanked me for my help.

## 2019-03-22 ENCOUNTER — Encounter: Payer: Self-pay | Admitting: Cardiology

## 2019-03-22 ENCOUNTER — Other Ambulatory Visit: Payer: Self-pay

## 2019-03-22 ENCOUNTER — Ambulatory Visit (INDEPENDENT_AMBULATORY_CARE_PROVIDER_SITE_OTHER): Payer: Medicare Other | Admitting: Cardiology

## 2019-03-22 VITALS — BP 118/70 | HR 57 | Ht 67.0 in | Wt 138.2 lb

## 2019-03-22 DIAGNOSIS — I480 Paroxysmal atrial fibrillation: Secondary | ICD-10-CM

## 2019-03-22 MED ORDER — APIXABAN 5 MG PO TABS: ORAL_TABLET | ORAL | 0 refills | Status: DC

## 2019-03-22 NOTE — Progress Notes (Signed)
Clinical Summary [name] is a 73 y.o.female seen today for follow up of the following medical problems.    1. Afib - new diagnosis during 12/2014 admission, presented with palpitations and left arm pain - converted back to NSR while admitted, discharged on low dose lopressor, higher doses caused some low heart rates and soft bp's.  - echo LVEF 55-60%, mod LAE.    - rare palpitations.  - no bleeding on eliquis  2. Metatstic Adenocardinoma Ovarian - followed by oncology - s/p surgery 03/2018     SH: works Scientist, product/process development, just recentlty retired.     Past Medical History:  Diagnosis Date  . Anxiety    new dx  . Cancer ([place]) 1988, 2015   ovarian, adenocarcinoma  . GERD (gastroesophageal reflux disease)    hx of years ago  . Hx of radiation therapy 01/16/14-02/27/14   abdomen   . Hypercholesteremia    under control with diet and fish oil  . Hypertension   . Ovarian cyst   . PONV (postoperative nausea and vomiting)   . Restless leg syndrome   . Thyroid disease      No Known Allergies   Current Outpatient Medications  Medication Sig Dispense Refill  . ALPRAZolam (XANAX) 0.25 MG tablet Take 0.25 mg by mouth at bedtime as needed.     Marland Kitchen apixaban (ELIQUIS) 5 MG TABS tablet TAKE (1) TABLET BY MOUTH TWICE DAILY. 42 tablet 0  . Calcium Carb-Cholecalciferol (CALCIUM 600 + D PO) Take 1 tablet by mouth daily.    . Cholecalciferol (VITAMIN D3 PO) Take 2,000 Units by mouth daily.     Marland Kitchen HYDROcodone-acetaminophen (NORCO) 5-325 MG tablet Take 1 tablet by mouth every 8 (eight) hours as needed for moderate pain. 30 tablet 0  . levothyroxine (SYNTHROID, LEVOTHROID) 75 MCG tablet Take 75 mcg by mouth every morning.     . metoprolol tartrate (LOPRESSOR) 25 MG tablet TAKE (1/2) TABLET BY MOUTH TWICE DAILY. 90 tablet 0  . PREMARIN 0.625 MG tablet TAKE 1 TABLET ONCE DAILY FOR 21 DAYS, THEN OFF 7 DAYS AS DIRECTED. 30 tablet 1  . rOPINIRole (REQUIP) 0.25 MG tablet Take  0.25-0.75 mg by mouth See admin instructions. Pt takes 1 tablet nightly but can take up to 3 if needed for restless leg     No current facility-administered medications for this visit.    Facility-Administered Medications Ordered in Other Visits  Medication Dose Route Frequency Provider Last Rate Last Dose  . 0.9 %  sodium chloride infusion   Intravenous Once [name] B, MD      . 0.9 %  sodium chloride infusion   Intravenous Once Ladell Pier, MD         Past Surgical History:  Procedure Laterality Date  . APPENDECTOMY    . APPLICATION OF A-CELL OF CHEST/ABDOMEN N/A 11/05/2013   Procedure: ABDOMINAL WALL RESCONTRUCTION WITH STRATUS;  Surgeon: Irene Limbo, MD;  Location: [facility];  Service: Plastics;  Laterality: N/A;  . CHOLECYSTECTOMY  2009  . LAPAROTOMY N/A 11/05/2013   Procedure: RESECTION OF PELVIC MASS;  Surgeon: Imagene Gurney A. Alycia Rossetti, MD;  Location: [facility];  Service: Gynecology;  Laterality: N/A;  . OOPHORECTOMY     BSO  . TONSILLECTOMY  as child  . TOTAL ABDOMINAL HYSTERECTOMY  1988   ovarian cyst  BSO     No Known Allergies    Family History  Problem Relation Age of Onset  . Heart disease Mother   .  Heart disease Sister   . Diabetes Sister   . Cancer Sister        melanoma-skin cancer  . Breast cancer Paternal Grandmother        Age 60's  . Cancer Paternal Grandmother        breast  . Cancer Paternal Grandfather        prostate/bladder     Social History [name] reports that she has never smoked. She has never used smokeless tobacco. [name] reports no history of alcohol use.   Review of Systems CONSTITUTIONAL: No weight loss, fever, chills, weakness or fatigue.  HEENT: Eyes: No visual loss, blurred vision, double vision or yellow sclerae.No hearing loss, sneezing, congestion, runny nose or sore throat.  SKIN: No rash or itching.  CARDIOVASCULAR: per hpi RESPIRATORY: No shortness of breath, cough or sputum.  GASTROINTESTINAL: No  anorexia, nausea, vomiting or diarrhea. No abdominal pain or blood.  GENITOURINARY: No burning on urination, no polyuria NEUROLOGICAL: No headache, dizziness, syncope, paralysis, ataxia, numbness or tingling in the extremities. No change in bowel or bladder control.  MUSCULOSKELETAL: No muscle, back pain, joint pain or stiffness.  LYMPHATICS: No enlarged nodes. No history of splenectomy.  PSYCHIATRIC: No history of depression or anxiety.  ENDOCRINOLOGIC: No reports of sweating, cold or heat intolerance. No polyuria or polydipsia.  Marland Kitchen   Physical Examination Today's Vitals   03/22/19 1456  BP: 118/70  Pulse: (!) 57  SpO2: 98%  Weight: 138 lb 3.2 oz (62.7 kg)  Height: 5\' 7"  (1.702 m)   Body mass index is 21.65 kg/m.  Gen: resting comfortably, no acute distress HEENT: no scleral icterus, pupils equal round and reactive, no palptable cervical adenopathy,  CV: RRR, no mr/g, no jvd Resp: Clear to auscultation bilaterally GI: abdomen is soft, non-tender, non-distended, normal bowel sounds, no hepatosplenomegaly MSK: extremities are warm, no edema.  Skin: warm, no rash Neuro:  no focal deficits Psych: appropriate affect   Diagnostic Studies 12/2014 echo Study Conclusions  - Left ventricle: The cavity size was normal. Wall thickness was normal. Systolic function was normal. The estimated ejection fraction was in the range of 55% to 60%. Wall motion was normal; there were no regional wall motion abnormalities. Left ventricular diastolic function parameters were normal. - Mitral valve: Mildly calcified annulus. There was mild regurgitation. - Left atrium: The atrium was moderately dilated. Volume/bsa, S: 37.9 ml/m^2. - Right atrium: The atrium was mildly to moderately dilated. - Tricuspid valve: There was mild-moderate regurgitation.    Assessment and Plan  1. PAF - mild infrequent symptoms, continue current meds - continue eliquis for stroke prevention  EKG  today shows sinus brady 57.   F/u 1 year.       Arnoldo Lenis, M.D.

## 2019-03-22 NOTE — Patient Instructions (Signed)

## 2019-03-22 NOTE — Addendum Note (Signed)
Addended by: Julian Hy T on: 03/22/2019 03:57 PM   Modules accepted: Orders

## 2019-03-25 DIAGNOSIS — L821 Other seborrheic keratosis: Secondary | ICD-10-CM | POA: Diagnosis not present

## 2019-03-25 DIAGNOSIS — L84 Corns and callosities: Secondary | ICD-10-CM | POA: Diagnosis not present

## 2019-03-25 DIAGNOSIS — B0089 Other herpesviral infection: Secondary | ICD-10-CM | POA: Diagnosis not present

## 2019-03-25 DIAGNOSIS — D225 Melanocytic nevi of trunk: Secondary | ICD-10-CM | POA: Diagnosis not present

## 2019-03-25 DIAGNOSIS — L570 Actinic keratosis: Secondary | ICD-10-CM | POA: Diagnosis not present

## 2019-04-10 ENCOUNTER — Other Ambulatory Visit: Payer: Self-pay | Admitting: Cardiology

## 2019-04-15 DIAGNOSIS — E7849 Other hyperlipidemia: Secondary | ICD-10-CM | POA: Diagnosis not present

## 2019-04-15 DIAGNOSIS — I4891 Unspecified atrial fibrillation: Secondary | ICD-10-CM | POA: Diagnosis not present

## 2019-04-18 ENCOUNTER — Other Ambulatory Visit: Payer: Self-pay

## 2019-04-18 ENCOUNTER — Encounter (HOSPITAL_COMMUNITY): Payer: Self-pay

## 2019-04-18 ENCOUNTER — Inpatient Hospital Stay: Payer: Medicare Other | Attending: Oncology

## 2019-04-18 ENCOUNTER — Ambulatory Visit (HOSPITAL_COMMUNITY)
Admission: RE | Admit: 2019-04-18 | Discharge: 2019-04-18 | Disposition: A | Discharge status: Home / Self Care | Payer: Medicare Other | Source: Ambulatory Visit | Attending: Oncology | Admitting: Oncology

## 2019-04-18 DIAGNOSIS — C569 Malignant neoplasm of unspecified ovary: Secondary | ICD-10-CM

## 2019-04-18 DIAGNOSIS — Z87440 Personal history of urinary (tract) infections: Secondary | ICD-10-CM | POA: Diagnosis not present

## 2019-04-18 DIAGNOSIS — I700 Atherosclerosis of aorta: Secondary | ICD-10-CM | POA: Diagnosis not present

## 2019-04-18 DIAGNOSIS — Z7901 Long term (current) use of anticoagulants: Secondary | ICD-10-CM | POA: Diagnosis not present

## 2019-04-18 DIAGNOSIS — C44509 Unspecified malignant neoplasm of skin of other part of trunk: Secondary | ICD-10-CM | POA: Diagnosis not present

## 2019-04-18 DIAGNOSIS — I4891 Unspecified atrial fibrillation: Secondary | ICD-10-CM | POA: Diagnosis not present

## 2019-04-18 DIAGNOSIS — M5136 Other intervertebral disc degeneration, lumbar region: Secondary | ICD-10-CM | POA: Insufficient documentation

## 2019-04-18 DIAGNOSIS — K5730 Diverticulosis of large intestine without perforation or abscess without bleeding: Secondary | ICD-10-CM | POA: Diagnosis not present

## 2019-04-18 DIAGNOSIS — N281 Cyst of kidney, acquired: Secondary | ICD-10-CM | POA: Diagnosis not present

## 2019-04-18 DIAGNOSIS — Z8543 Personal history of malignant neoplasm of ovary: Secondary | ICD-10-CM | POA: Diagnosis not present

## 2019-04-18 LAB — BASIC METABOLIC PANEL - CANCER CENTER ONLY
Anion gap: 9 (ref 5–15)
BUN: 17 mg/dL (ref 8–23)
CO2: 28 mmol/L (ref 22–32)
Calcium: 10.1 mg/dL (ref 8.9–10.3)
Chloride: 103 mmol/L (ref 98–111)
Creatinine: 0.81 mg/dL (ref 0.44–1.00)
GFR, Est AFR Am: >60 mL/min (ref >60)
GFR, Estimated: >60 mL/min (ref >60)
Glucose, Bld: 89 mg/dL (ref 70–99)
Potassium: 4.8 mmol/L (ref 3.5–5.1)
Sodium: 140 mmol/L (ref 135–145)

## 2019-04-18 MED ORDER — IOHEXOL 300 MG/ML  SOLN
100.0000 mL | Freq: Once | INTRAMUSCULAR | Status: AC | PRN
  Administered 2019-04-18: 100 mL via INTRAVENOUS

## 2019-04-18 MED ORDER — SODIUM CHLORIDE (PF) 0.9 % IJ SOLN
INTRAMUSCULAR | Status: AC
  Filled 2019-04-18: qty 50

## 2019-04-19 LAB — CA 125: Cancer Antigen (CA) 125: 19.5 U/mL (ref 0.0–38.1)

## 2019-04-22 ENCOUNTER — Inpatient Hospital Stay (HOSPITAL_BASED_OUTPATIENT_CLINIC_OR_DEPARTMENT_OTHER): Payer: Medicare Other | Admitting: Oncology

## 2019-04-22 ENCOUNTER — Other Ambulatory Visit: Payer: Self-pay

## 2019-04-22 VITALS — BP 130/66 | HR 60 | Temp 98.2°F | Resp 17 | Ht 67.0 in | Wt 138.9 lb

## 2019-04-22 DIAGNOSIS — Z8543 Personal history of malignant neoplasm of ovary: Secondary | ICD-10-CM | POA: Diagnosis not present

## 2019-04-22 DIAGNOSIS — C569 Malignant neoplasm of unspecified ovary: Secondary | ICD-10-CM | POA: Diagnosis not present

## 2019-04-22 NOTE — Progress Notes (Signed)
[place] OFFICE PROGRESS NOTE   Diagnosis: Ovarian cancer  INTERVAL HISTORY:   [name] returns as scheduled.  She feels well.  She has occasional discomfort in the suprapubic area.  No consistent pain.  No other complaint.  Objective:  Vital signs in last 24 hours:  Blood pressure 130/66, pulse 60, temperature 98.2 F (36.8 C), temperature source Temporal, resp. rate 17, height 5' 7" (1.702 m), weight 138 lb 14.4 oz (63 kg), SpO2 100 %.     Lymphatics: No cervical or supraclavicular nodes.  No inguinal nodes. GI: No hepatosplenomegaly, no apparent ascites, no mass, nontender Vascular: No leg edema    Lab Results:  Lab Results  Component Value Date   WBC 3.6 (L) 10/19/2018   HGB 11.1 (L) 10/19/2018   HCT 34.5 (L) 10/19/2018   MCV 107.8 (H) 10/19/2018   PLT 180 10/19/2018   NEUTROABS 2.0 10/19/2018    CMP  Lab Results  Component Value Date   NA 140 04/18/2019   K 4.8 04/18/2019   CL 103 04/18/2019   CO2 28 04/18/2019   GLUCOSE 89 04/18/2019   BUN 17 04/18/2019   CREATININE 0.81 04/18/2019   CALCIUM 10.1 04/18/2019   PROT 6.9 10/19/2018   ALBUMIN 4.0 10/19/2018   AST 22 10/19/2018   ALT 18 10/19/2018   ALKPHOS 53 10/19/2018   BILITOT 0.4 10/19/2018   GFRNONAA >60 04/18/2019   GFRAA >60 04/18/2019  CA-125 on 04/18/2019: 19.5  Imaging:  Ct Abdomen Pelvis W Contrast  Result Date: 04/18/2019 CLINICAL DATA:  Follow-up metastatic ovarian carcinoma. Restaging. EXAM: CT ABDOMEN AND PELVIS WITH CONTRAST TECHNIQUE: Multidetector CT imaging of the abdomen and pelvis was performed using the standard protocol following bolus administration of intravenous contrast. CONTRAST:  170m OMNIPAQUE IOHEXOL 300 MG/ML  SOLN COMPARISON:  10/19/2018 FINDINGS: Lower Chest: No acute findings. Hepatobiliary: No hepatic masses identified. Prior cholecystectomy. No evidence of biliary obstruction. Pancreas:  No mass or inflammatory changes. Spleen: Within normal  limits in size and appearance. Adrenals/Urinary Tract: No masses identified. A few small left renal cysts are again seen. No evidence of hydronephrosis. Stomach/Bowel: No evidence of obstruction, inflammatory process or abnormal fluid collections. Diverticulosis is seen mainly involving the sigmoid colon, however there is no evidence of diverticulitis. Vascular/Lymphatic: No pathologically enlarged lymph nodes. No abdominal aortic aneurysm. Aortic atherosclerosis. Reproductive: Prior hysterectomy noted. Adnexal regions are unremarkable in appearance. No evidence of mass, peritoneal thickening, or ascites. Other: Laxity of suprapubic anterior abdominal wall again seen without evidence of hernia. Musculoskeletal: No suspicious bone lesions identified. Moderate degenerative disc disease again noted at L4-5. IMPRESSION: 1. No evidence of recurrent or metastatic carcinoma within the abdomen or pelvis. 2. Colonic diverticulosis. No radiographic evidence of diverticulitis. Aortic Atherosclerosis (ICD10-I70.0). Electronically Signed   By: JMarlaine HindM.D.   On: 04/18/2019 14:03    Medications: I have reviewed the patient's current medications.   Assessment/Plan: 1. 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   Elevated CA 125.   Cycle 1 Taxol/carboplatin 08/16/2013.   Cycle 2 Taxol/carboplatin 09/06/2013.   Cycle 3 Taxol/carboplatin 09/27/2013   CT 10/11/2013 with a stable abdominal wall mass.  Status post resection of abdominal wall mass with abdominal wall reconstruction 11/05/2013. Final pathology showed adenocarcinoma. Specimen extensively involved with adenocarcinoma which focally involved the edge of the specimen. Immunostains and Foundation 1 testing were  nonspecific for a primary tumor location.  Adjuvant radiation to the abdominal wall 01/16/2014  through 02/27/2014  CT 01/15/2018 while in the emergency room for evaluation of back pain-midline abdominal wall mass above the pubic symphysis and adjacent to the bladder  Ultrasound-guided biopsy of the abdominal wall mass 01/25/2018-adenocarcinoma similar to the 2015 biopsy  PET scan 03/01/2018-hypermetabolism associated with a cystic and solid suprapubic mass. Focal uptake identified in the left L3-4 facets without underlying bony lesion. No other sites of unexpected or suspicious hypermetabolism in the neck, chest, abdomen or pelvis.  Exploratory laparotomy, resection of abdominal wall tumor, oversew of bladder peritoneum, cystoscopy 03/28/2018(findings included a cystic and solid mass encountered below the lower edge of the incision extending into the space ofretziusand adherent to the pubic arch inferiorly, bladder dome posteriorly and prior abdominal wall repair anteriorly. No nodularity palpated in the tumor bed. Visualized bowel and omentum free of disease. Pelvic peritoneum appeared normal). A "small "amount of tumor on the periosteum was cauterized using a Bovie.  Pathology on pelvic tumor-metastatic high-grade adenocarcinoma, favor clear-cell carcinoma of gynecologic origin, fragments up to 5.8 cm in size; ER negative, PR positive, 1+ (weak staining) to focally 2+, 30-40%, MSI-stable, mutation burden-4,CREBBPalteration  Cycle 1 gemcitabine/carboplatin 05/21/2018; day 8 held due to neutropenia  Cycle 2 gemcitabine/carboplatin 06/11/2018 (day 8 gemcitabine eliminated)  Cycle 3 gemcitabine/carboplatin 07/06/2018  Cycle 4 gemcitabine/carboplatin 07/27/2018 (Udenyca added)  Cycle 5 gemcitabine/carboplatin 08/17/2018  Cycle 6 gemcitabine/carboplatin 09/07/2018   CT 10/19/2018- low anterior abdominal wall mass no longer identified, soft tissue thickening cephalad to the site of prior mass-nonspecific, no additional evidence of metastatic disease  CT 04/18/2019-no evidence of recurrent or  metastatic disease 2. Ovarian cancer in 1988, low-grade adenocarcinoma with areas of "borderline" carcinoma, status post a hysterectomy and bilateral oophorectomy. 3. New onset atrial fibrillation August 2016, maintained on eliquis 4. Recurrent urinary tract infections     Disposition: [name] appears stable.  She is in clinical remission from ovarian cancer.  She will return for an office visit and CA-125 in 4 months.  She will contact us in the interim for new symptoms.  [name], MD  04/22/2019  10:16 AM

## 2019-04-23 ENCOUNTER — Telehealth: Payer: Self-pay | Admitting: Oncology

## 2019-04-23 NOTE — Telephone Encounter (Signed)
I left a message regarding schedule  

## 2019-04-24 ENCOUNTER — Telehealth: Payer: Self-pay | Admitting: Cardiology

## 2019-04-24 NOTE — Telephone Encounter (Signed)
3 Sample boxes of Eliquis Lot # [id number], Exp: 04/2021 placed at front desk for pick up. Patient notified and voiced understanding.

## 2019-04-24 NOTE — Telephone Encounter (Signed)
Pt is in the doughnut hole w/ her apixaban (ELIQUIS) 5 MG TABS tablet [[id number]]  And is wanting to know if she can get some samples

## 2019-05-19 IMAGING — CT CT RENAL STONE PROTOCOL
2 of 4 series · 16 of 46 positions shown, 18 images · non-contrast
Comparison: CT 10/11/2013

CLINICAL DATA: Recent urinary tract infections. Adenocarcinoma of
the abdominal wall diagnosed 3919 with resection and chemotherapy.

EXAM:
CT ABDOMEN AND PELVIS WITHOUT CONTRAST
TECHNIQUE: Multidetector CT imaging of the abdomen and pelvis was performed
following the standard protocol without IV contrast.

[Series 2: axial st · axial · 0.61mm/px · z∈[-489,-119]mm · 13 of 82 slices shown, 15 images]
[im 4/82  soft-tissue]
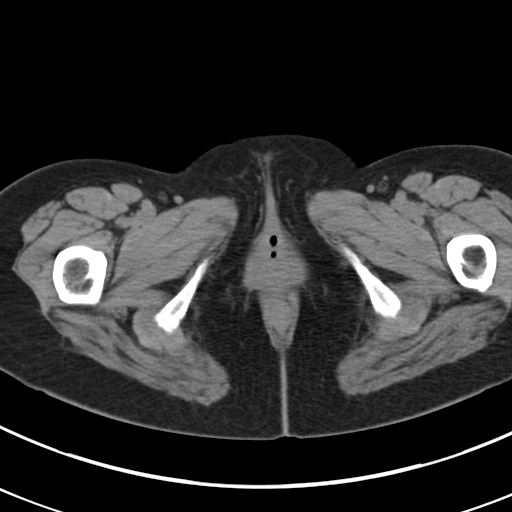
[im 4/82  bone]
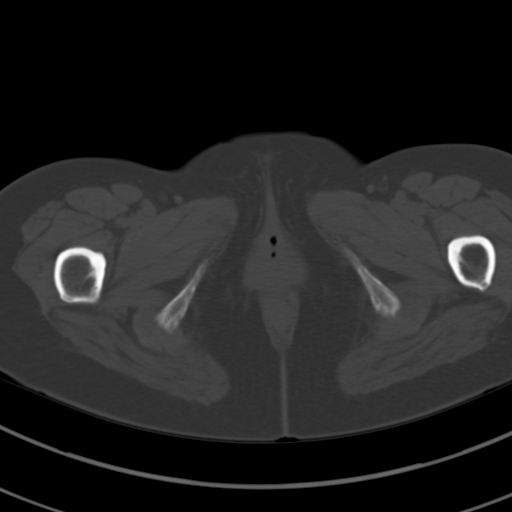
[im 12/82  soft-tissue]
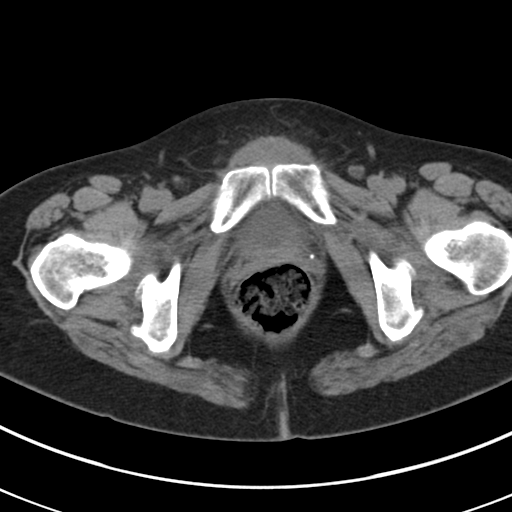
[im 19/82  soft-tissue]
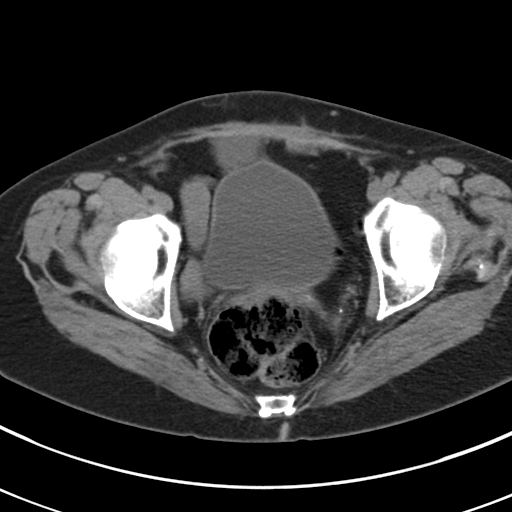
[im 23/82  soft-tissue]
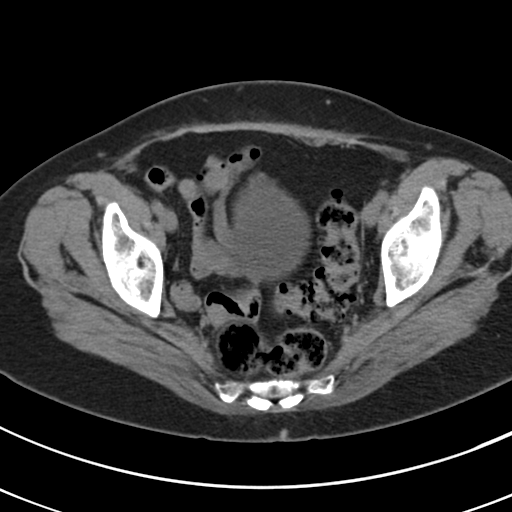
[im 30/82  soft-tissue]
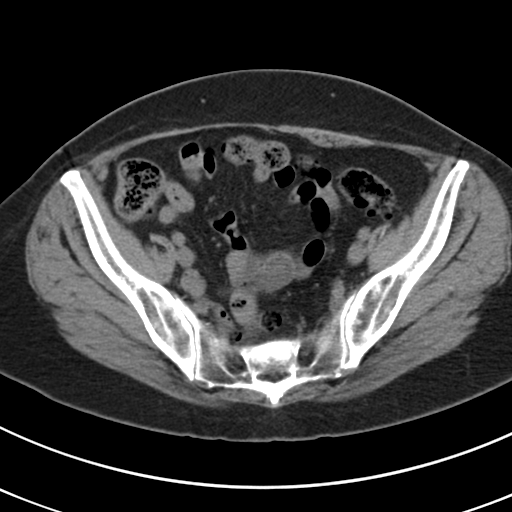
[im 34/82  soft-tissue]
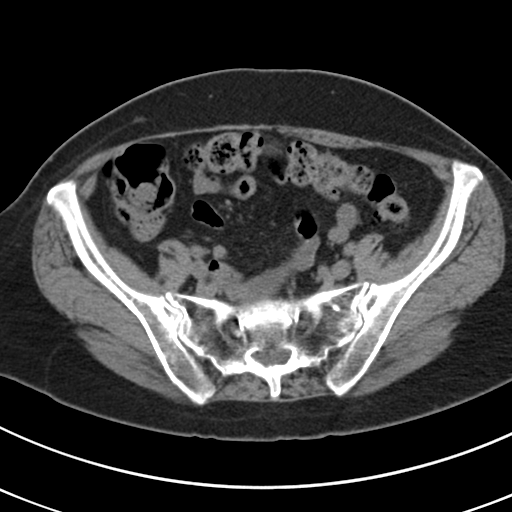
[im 41/82  soft-tissue]
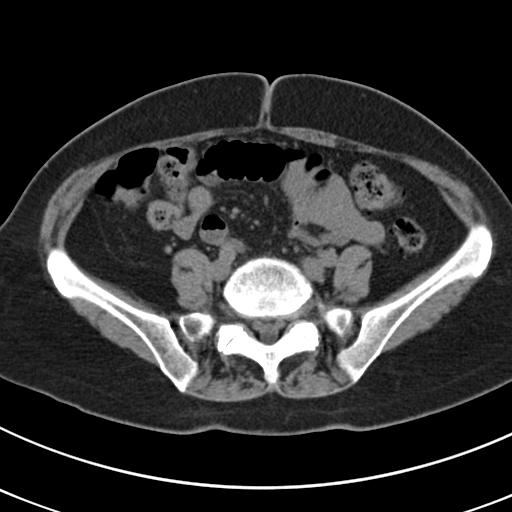
[im 48/82  soft-tissue]
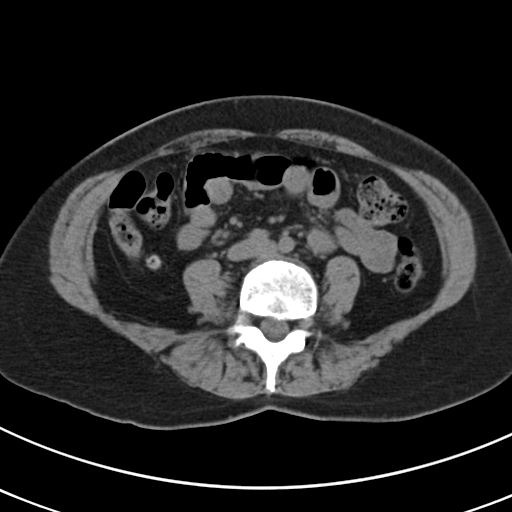
[im 52/82  soft-tissue]
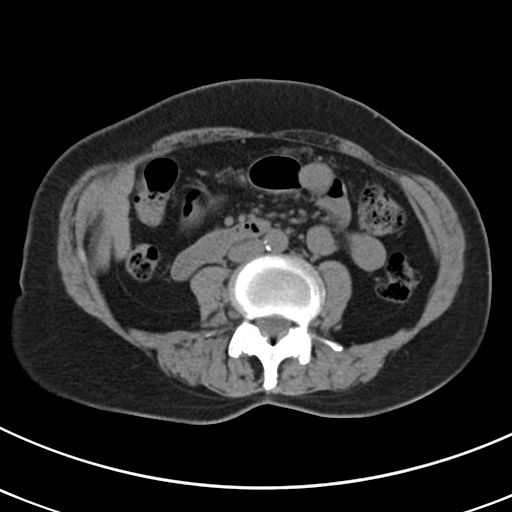
[im 52/82  bone]
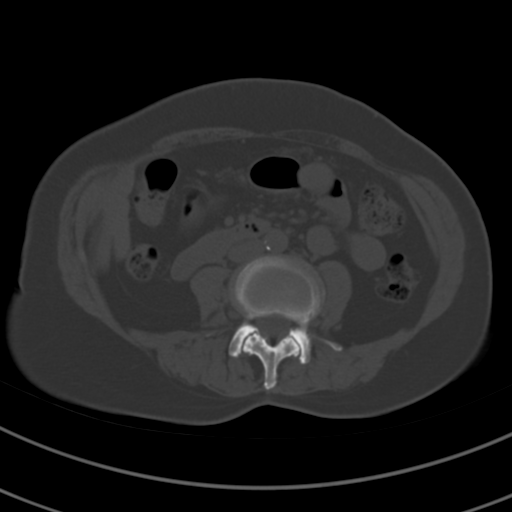
[im 59/82  soft-tissue]
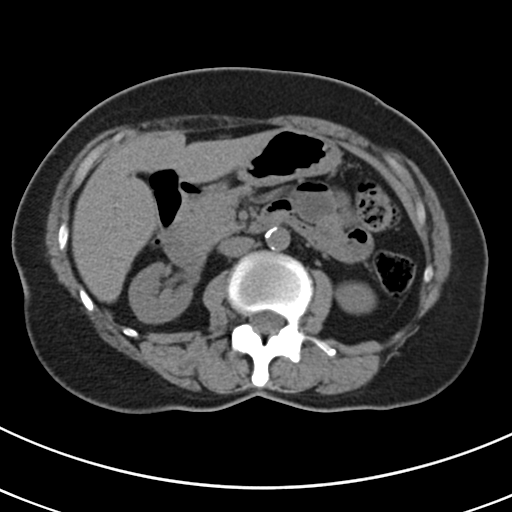
[im 63/82  soft-tissue]
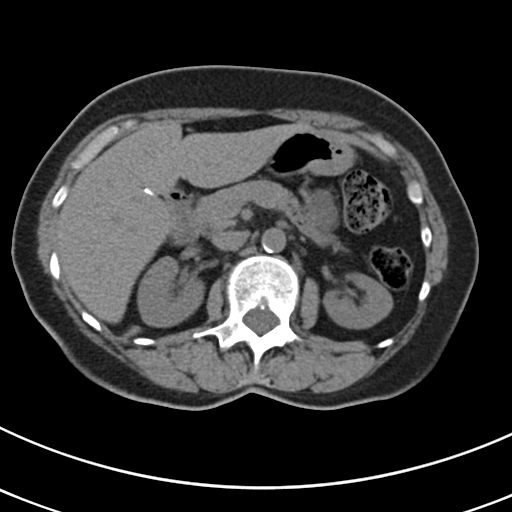
[im 70/82  soft-tissue]
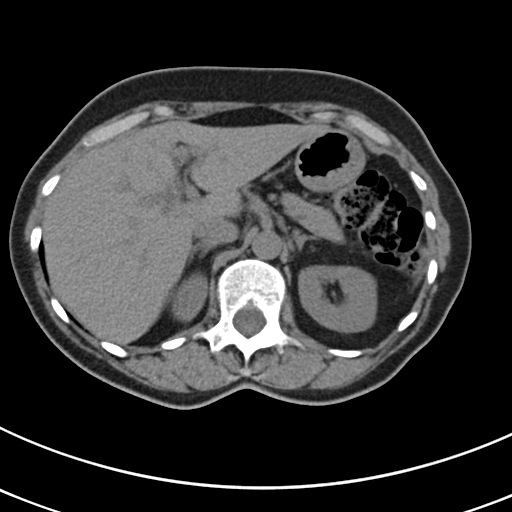
[im 78/82  soft-tissue]
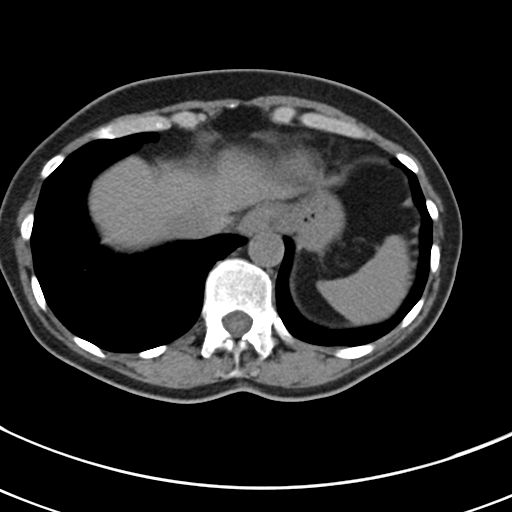

[Series 5: coronal · coronal · 0.68mm/px · 3 of 109 slices shown]
[im 37/109  soft-tissue]
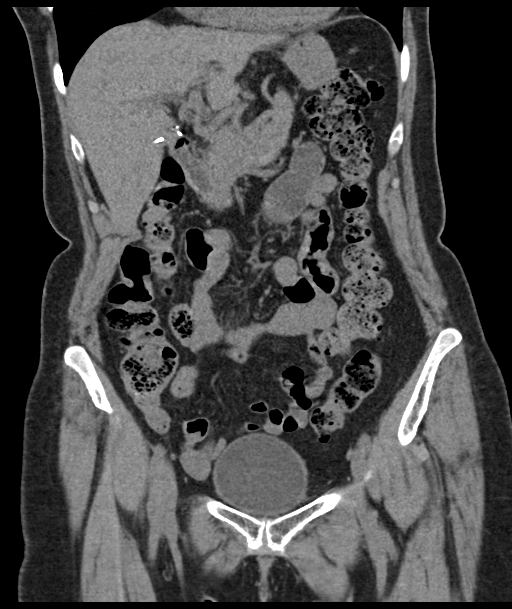
[im 49/109  soft-tissue]
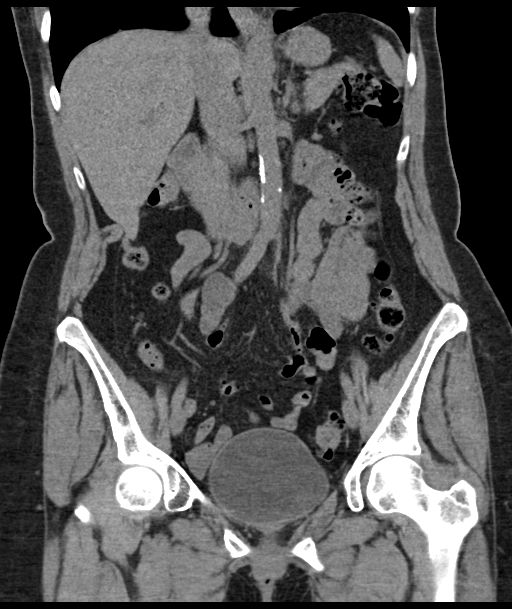
[im 61/109  soft-tissue]
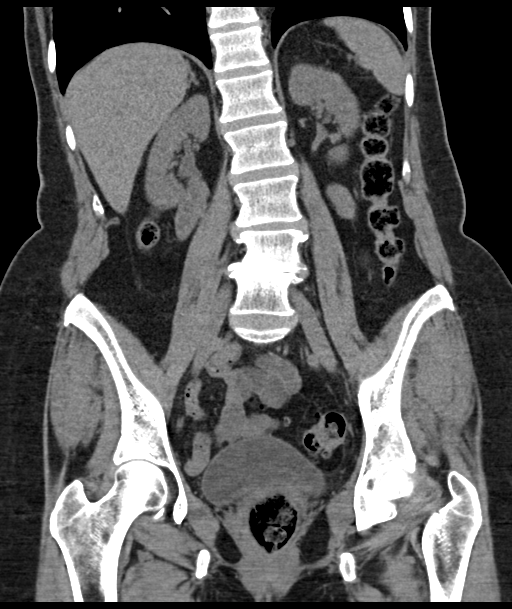

[16 of 46 positions shown; findings below may reference images not displayed]

FINDINGS: Lower chest: Lung bases are clear.

Hepatobiliary: No focal hepatic lesion. Postcholecystectomy. No
biliary dilatation.

Pancreas: Pancreas is normal. No ductal dilatation. No pancreatic
inflammation.

Spleen: Normal spleen

Adrenals/urinary tract: Adrenal glands normal. No nephrolithiasis
ureterolithiasis. Simple fluid attenuation lesion upper pole of the
LEFT kidney measures 20 mm and mildly increased from comparison exam
at 14 mm. Ureters and bladder normal. No gas within the bladder.

Anterior to the bladder just above the pubic symphysis there is a
soft tissue mass involving the midline abdominal wall measuring
by 2.5 by 3.8 cm. This mass has low attenuation centrally

On comparison CT of 07/19/2013 there was a mass more superior
measuring 5.5 x 7.8 by 7.8 which was resected by report and found to
be adenocarcinoma. Potential ovarian origin.

Stomach/Bowel: Stomach, small bowel, appendix, and cecum are normal.
Multiple diverticular of the sigmoid colon without acute
inflammation.

Vascular/Lymphatic: Abdominal aorta is normal caliber with
atherosclerotic calcification. There is no retroperitoneal or
periportal lymphadenopathy. No pelvic lymphadenopathy.

Reproductive: Post hysterectomy

Other: No free fluid or free air.

Musculoskeletal: No aggressive osseous lesion.
IMPRESSION: 1. Mass in the midline abdominal wall just above the pubic symphysis
and immediately adjacent to the bladder is concerning for recurrence
of abdominal wall adenocarcinoma resected in 3919.
2. No nephrolithiasis ureterolithiasis.
3. Probable Bosniak 1 cyst of the upper pole LEFT kidney.

## 2019-06-04 DIAGNOSIS — R8279 Other abnormal findings on microbiological examination of urine: Secondary | ICD-10-CM | POA: Diagnosis not present

## 2019-06-18 DIAGNOSIS — E039 Hypothyroidism, unspecified: Secondary | ICD-10-CM | POA: Diagnosis not present

## 2019-06-18 DIAGNOSIS — E049 Nontoxic goiter, unspecified: Secondary | ICD-10-CM | POA: Diagnosis not present

## 2019-06-27 DIAGNOSIS — E049 Nontoxic goiter, unspecified: Secondary | ICD-10-CM | POA: Diagnosis not present

## 2019-06-27 DIAGNOSIS — E039 Hypothyroidism, unspecified: Secondary | ICD-10-CM | POA: Diagnosis not present

## 2019-07-08 DIAGNOSIS — Z23 Encounter for immunization: Secondary | ICD-10-CM | POA: Diagnosis not present

## 2019-07-14 DIAGNOSIS — E7849 Other hyperlipidemia: Secondary | ICD-10-CM | POA: Diagnosis not present

## 2019-07-14 DIAGNOSIS — I4891 Unspecified atrial fibrillation: Secondary | ICD-10-CM | POA: Diagnosis not present

## 2019-07-22 DIAGNOSIS — L821 Other seborrheic keratosis: Secondary | ICD-10-CM | POA: Diagnosis not present

## 2019-07-22 DIAGNOSIS — L570 Actinic keratosis: Secondary | ICD-10-CM | POA: Diagnosis not present

## 2019-07-22 DIAGNOSIS — L820 Inflamed seborrheic keratosis: Secondary | ICD-10-CM | POA: Diagnosis not present

## 2019-07-23 ENCOUNTER — Other Ambulatory Visit: Payer: Self-pay | Admitting: Oncology

## 2019-07-29 DIAGNOSIS — Z6823 Body mass index (BMI) 23.0-23.9, adult: Secondary | ICD-10-CM | POA: Diagnosis not present

## 2019-07-29 DIAGNOSIS — G894 Chronic pain syndrome: Secondary | ICD-10-CM | POA: Diagnosis not present

## 2019-07-29 DIAGNOSIS — G4709 Other insomnia: Secondary | ICD-10-CM | POA: Diagnosis not present

## 2019-08-03 DIAGNOSIS — Z23 Encounter for immunization: Secondary | ICD-10-CM | POA: Diagnosis not present

## 2019-08-14 DIAGNOSIS — I4891 Unspecified atrial fibrillation: Secondary | ICD-10-CM | POA: Diagnosis not present

## 2019-08-14 DIAGNOSIS — E7849 Other hyperlipidemia: Secondary | ICD-10-CM | POA: Diagnosis not present

## 2019-08-19 ENCOUNTER — Other Ambulatory Visit: Payer: Self-pay | Admitting: Cardiology

## 2019-08-22 ENCOUNTER — Other Ambulatory Visit: Payer: Self-pay

## 2019-08-22 ENCOUNTER — Inpatient Hospital Stay: Payer: Medicare Other | Attending: Oncology

## 2019-08-22 ENCOUNTER — Inpatient Hospital Stay (HOSPITAL_BASED_OUTPATIENT_CLINIC_OR_DEPARTMENT_OTHER): Payer: Medicare Other | Admitting: Oncology

## 2019-08-22 VITALS — BP 112/58 | HR 56 | Temp 98.9°F | Resp 17 | Ht 67.0 in | Wt 145.8 lb

## 2019-08-22 DIAGNOSIS — Z7901 Long term (current) use of anticoagulants: Secondary | ICD-10-CM | POA: Diagnosis not present

## 2019-08-22 DIAGNOSIS — Z8542 Personal history of malignant neoplasm of other parts of uterus: Secondary | ICD-10-CM | POA: Diagnosis not present

## 2019-08-22 DIAGNOSIS — I4891 Unspecified atrial fibrillation: Secondary | ICD-10-CM | POA: Diagnosis not present

## 2019-08-22 DIAGNOSIS — H538 Other visual disturbances: Secondary | ICD-10-CM | POA: Insufficient documentation

## 2019-08-22 DIAGNOSIS — G2581 Restless legs syndrome: Secondary | ICD-10-CM | POA: Insufficient documentation

## 2019-08-22 DIAGNOSIS — C569 Malignant neoplasm of unspecified ovary: Secondary | ICD-10-CM

## 2019-08-22 DIAGNOSIS — Z87440 Personal history of urinary (tract) infections: Secondary | ICD-10-CM | POA: Diagnosis not present

## 2019-08-22 NOTE — Progress Notes (Signed)
[place] OFFICE PROGRESS NOTE   Diagnosis: Ovarian cancer  INTERVAL HISTORY:   [name] returns as scheduled.  She feels well.  Good appetite.  She continues to have "swelling "in the lower abdomen.  She developed blurred vision when she increase the Requip dose.  She takes Requip for restless legs.  The blurred vision resolved when she reduce the dose.  She has received the COVID-19 vaccine.  Objective:  Vital signs in last 24 hours:  Blood pressure (!) 112/58, pulse (!) 56, temperature 98.9 F (37.2 C), temperature source Temporal, resp. rate 17, height 5' 7"  (1.702 m), weight 145 lb 12.8 oz (66.1 kg), SpO2 100 %.    Lymph nodes: No cervical, supraclavicular, axillary, or inguinal nodes Cardio: Regular rate and rhythm GI: No apparent ascites, no mass, no hepatosplenomegaly, soft reducible fullness in the left greater than right suprapubic area Vascular: No leg edema    Lab Results:  Lab Results  Component Value Date   WBC 3.6 (L) 10/19/2018   HGB 11.1 (L) 10/19/2018   HCT 34.5 (L) 10/19/2018   MCV 107.8 (H) 10/19/2018   PLT 180 10/19/2018   NEUTROABS 2.0 10/19/2018    CMP  Lab Results  Component Value Date   NA 140 04/18/2019   K 4.8 04/18/2019   CL 103 04/18/2019   CO2 28 04/18/2019   GLUCOSE 89 04/18/2019   BUN 17 04/18/2019   CREATININE 0.81 04/18/2019   CALCIUM 10.1 04/18/2019   PROT 6.9 10/19/2018   ALBUMIN 4.0 10/19/2018   AST 22 10/19/2018   ALT 18 10/19/2018   ALKPHOS 53 10/19/2018   BILITOT 0.4 10/19/2018   GFRNONAA >60 04/18/2019   GFRAA >60 04/18/2019   Ca1 25 on 04/18/2019: 19.5  Medications: I have reviewed the patient's current medications.   Assessment/Plan: 1. 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   Elevated CA 125.   Cycle 1 Taxol/carboplatin 08/16/2013.   Cycle 2  Taxol/carboplatin 09/06/2013.   Cycle 3 Taxol/carboplatin 09/27/2013   CT 10/11/2013 with a stable abdominal wall mass.  Status post resection of abdominal wall mass with abdominal wall reconstruction 11/05/2013. Final pathology showed adenocarcinoma. Specimen extensively involved with adenocarcinoma which focally involved the edge of the specimen. Immunostains and Foundation 1 testing were nonspecific for a primary tumor location.  Adjuvant radiation to the abdominal wall 01/16/2014 through 02/27/2014  CT 01/15/2018 while in the emergency room for evaluation of back pain-midline abdominal wall mass above the pubic symphysis and adjacent to the bladder  Ultrasound-guided biopsy of the abdominal wall mass 01/25/2018-adenocarcinoma similar to the 2015 biopsy  PET scan 03/01/2018-hypermetabolism associated with a cystic and solid suprapubic mass. Focal uptake identified in the left L3-4 facets without underlying bony lesion. No other sites of unexpected or suspicious hypermetabolism in the neck, chest, abdomen or pelvis.  Exploratory laparotomy, resection of abdominal wall tumor, oversew of bladder peritoneum, cystoscopy 03/28/2018(findings included a cystic and solid mass encountered below the lower edge of the incision extending into the space ofretziusand adherent to the pubic arch inferiorly, bladder dome posteriorly and prior abdominal wall repair anteriorly. No nodularity palpated in the tumor bed. Visualized bowel and omentum free of disease. Pelvic peritoneum appeared normal). A "small "amount of tumor on the periosteum was cauterized using a Bovie.  Pathology on pelvic tumor-metastatic high-grade adenocarcinoma, favor clear-cell carcinoma of gynecologic origin, fragments  up to 5.8 cm in size; ER negative, PR positive, 1+ (weak staining) to focally 2+, 30-40%, MSI-stable, mutation burden-4,CREBBPalteration  Cycle 1 gemcitabine/carboplatin 05/21/2018; day 8 held due to  neutropenia  Cycle 2 gemcitabine/carboplatin 06/11/2018 (day 8 gemcitabine eliminated)  Cycle 3 gemcitabine/carboplatin 07/06/2018  Cycle 4 gemcitabine/carboplatin 07/27/2018 (Udenyca added)  Cycle 5 gemcitabine/carboplatin 08/17/2018  Cycle 6 gemcitabine/carboplatin 09/07/2018   CT 10/19/2018- low anterior abdominal wall mass no longer identified, soft tissue thickening cephalad to the site of prior mass-nonspecific, no additional evidence of metastatic disease  CT 04/18/2019-no evidence of recurrent or metastatic disease 2. Ovarian cancer in 1988, low-grade adenocarcinoma with areas of "borderline" carcinoma, status post a hysterectomy and bilateral oophorectomy. 3. New onset atrial fibrillation August 2016, maintained on eliquis 4. Recurrent urinary tract infections 5. Restless legs   Disposition: [name] is in clinical remission from the gynecologic malignancy.  We will follow up on the CA-125 from today.  She will return for an office visit and restaging CT in 4 months. The lower abdomen/suprapubic swelling is likely related to muscular laxity following repeat abdominal surgery.  [name], MD  08/22/2019  1:10 PM

## 2019-08-23 ENCOUNTER — Telehealth: Payer: Self-pay | Admitting: *Deleted

## 2019-08-23 ENCOUNTER — Telehealth: Payer: Self-pay | Admitting: Oncology

## 2019-08-23 LAB — CA 125: Cancer Antigen (CA) 125: 18.6 U/mL (ref 0.0–38.1)

## 2019-08-23 NOTE — Telephone Encounter (Signed)
Scheduled per los. Called and left msg. Mailed printout  °

## 2019-08-23 NOTE — Telephone Encounter (Signed)
-----   Message from Ladell Pier, MD sent at 08/23/2019  6:49 AM EDT ----- Please call patient, ca125 is normal, f/u as scheduled

## 2019-08-26 ENCOUNTER — Telehealth: Payer: Self-pay | Admitting: *Deleted

## 2019-08-26 NOTE — Telephone Encounter (Signed)
Notified of normal CA125 result and to f/u as scheduled.

## 2019-09-26 ENCOUNTER — Other Ambulatory Visit: Payer: Self-pay | Admitting: Cardiology

## 2019-12-23 ENCOUNTER — Ambulatory Visit (HOSPITAL_COMMUNITY)
Admission: RE | Admit: 2019-12-23 | Discharge: 2019-12-23 | Disposition: A | Discharge status: Home / Self Care | Payer: Medicare Other | Source: Ambulatory Visit | Attending: Oncology | Admitting: Oncology

## 2019-12-23 ENCOUNTER — Other Ambulatory Visit: Payer: Self-pay

## 2019-12-23 ENCOUNTER — Telehealth: Payer: Self-pay

## 2019-12-23 ENCOUNTER — Inpatient Hospital Stay: Payer: Medicare Other | Attending: Nurse Practitioner

## 2019-12-23 ENCOUNTER — Encounter (HOSPITAL_COMMUNITY): Payer: Self-pay

## 2019-12-23 ENCOUNTER — Telehealth: Payer: Self-pay | Admitting: Nurse Practitioner

## 2019-12-23 DIAGNOSIS — R1909 Other intra-abdominal and pelvic swelling, mass and lump: Secondary | ICD-10-CM | POA: Diagnosis not present

## 2019-12-23 DIAGNOSIS — Z7901 Long term (current) use of anticoagulants: Secondary | ICD-10-CM | POA: Diagnosis not present

## 2019-12-23 DIAGNOSIS — C569 Malignant neoplasm of unspecified ovary: Secondary | ICD-10-CM | POA: Diagnosis not present

## 2019-12-23 DIAGNOSIS — I700 Atherosclerosis of aorta: Secondary | ICD-10-CM | POA: Insufficient documentation

## 2019-12-23 DIAGNOSIS — I4891 Unspecified atrial fibrillation: Secondary | ICD-10-CM | POA: Insufficient documentation

## 2019-12-23 DIAGNOSIS — Z9221 Personal history of antineoplastic chemotherapy: Secondary | ICD-10-CM | POA: Diagnosis not present

## 2019-12-23 DIAGNOSIS — K838 Other specified diseases of biliary tract: Secondary | ICD-10-CM | POA: Diagnosis not present

## 2019-12-23 DIAGNOSIS — G2581 Restless legs syndrome: Secondary | ICD-10-CM | POA: Diagnosis not present

## 2019-12-23 DIAGNOSIS — Z87440 Personal history of urinary (tract) infections: Secondary | ICD-10-CM | POA: Diagnosis not present

## 2019-12-23 DIAGNOSIS — K5730 Diverticulosis of large intestine without perforation or abscess without bleeding: Secondary | ICD-10-CM | POA: Diagnosis not present

## 2019-12-23 LAB — BASIC METABOLIC PANEL - CANCER CENTER ONLY
Anion gap: 7 (ref 5–15)
BUN: 15 mg/dL (ref 8–23)
CO2: 28 mmol/L (ref 22–32)
Calcium: 10.9 mg/dL — ABNORMAL HIGH (ref 8.9–10.3)
Chloride: 104 mmol/L (ref 98–111)
Creatinine: 0.86 mg/dL (ref 0.44–1.00)
GFR, Est AFR Am: >60 mL/min (ref >60)
GFR, Estimated: >60 mL/min (ref >60)
Glucose, Bld: 93 mg/dL (ref 70–99)
Potassium: 4.8 mmol/L (ref 3.5–5.1)
Sodium: 139 mmol/L (ref 135–145)

## 2019-12-23 MED ORDER — IOHEXOL 300 MG/ML  SOLN
100.0000 mL | Freq: Once | INTRAMUSCULAR | Status: AC | PRN
  Administered 2019-12-23: 100 mL via INTRAVENOUS

## 2019-12-23 MED ORDER — SODIUM CHLORIDE (PF) 0.9 % IJ SOLN
INTRAMUSCULAR | Status: AC
  Filled 2019-12-23: qty 50

## 2019-12-23 NOTE — Telephone Encounter (Signed)
Spoke with pt about her lab work calcium slightly elevated and rescheduling appt from this week to next pt asked I call her tomorrow once labs are complete to go over labs with her

## 2019-12-23 NOTE — Telephone Encounter (Signed)
MESSAGE #1  Message left for patient to return call to receive lab results and rescheduling of appt

## 2019-12-23 NOTE — Telephone Encounter (Signed)
-----   Message from Ladell Pier, MD sent at 12/23/2019 12:33 PM EDT ----- Please call patient ,calcium level is mildly elevated, likely related to calcium supplement if she took this this morning, hold calcium supplement and repeat BMP next visitShe is listed as seeing Lattie Haw on Wednesday, but Lattie Haw is not here, needs to be rescheduled

## 2019-12-23 NOTE — Telephone Encounter (Signed)
Rescheduled per 8/9 sch message. LT on PAL. Unable to reach pt. Left voicemail with appt time and date. appt moved to 8/19.

## 2019-12-23 NOTE — Telephone Encounter (Signed)
Message left for pt to call back concerning labs and follow up

## 2019-12-24 ENCOUNTER — Telehealth: Payer: Self-pay | Admitting: *Deleted

## 2019-12-24 LAB — CA 125: Cancer Antigen (CA) 125: 24.4 U/mL (ref 0.0–38.1)

## 2019-12-24 NOTE — Telephone Encounter (Signed)
Per Dr. Benay Spice: See patient 8/11 at 1030. Called patient and she agrees to appointment.

## 2019-12-25 ENCOUNTER — Inpatient Hospital Stay: Payer: Medicare Other | Admitting: Nurse Practitioner

## 2019-12-25 ENCOUNTER — Inpatient Hospital Stay: Payer: Medicare Other

## 2019-12-25 ENCOUNTER — Inpatient Hospital Stay (HOSPITAL_BASED_OUTPATIENT_CLINIC_OR_DEPARTMENT_OTHER): Payer: Medicare Other | Admitting: Oncology

## 2019-12-25 ENCOUNTER — Other Ambulatory Visit: Payer: Self-pay

## 2019-12-25 ENCOUNTER — Encounter (HOSPITAL_COMMUNITY): Payer: Self-pay | Admitting: Radiology

## 2019-12-25 ENCOUNTER — Other Ambulatory Visit: Payer: Self-pay | Admitting: *Deleted

## 2019-12-25 VITALS — BP 137/62 | HR 70 | Temp 96.3°F | Resp 17 | Ht 67.0 in | Wt 145.3 lb

## 2019-12-25 DIAGNOSIS — Z87440 Personal history of urinary (tract) infections: Secondary | ICD-10-CM | POA: Diagnosis not present

## 2019-12-25 DIAGNOSIS — C569 Malignant neoplasm of unspecified ovary: Secondary | ICD-10-CM

## 2019-12-25 DIAGNOSIS — I4891 Unspecified atrial fibrillation: Secondary | ICD-10-CM | POA: Diagnosis not present

## 2019-12-25 DIAGNOSIS — Z7901 Long term (current) use of anticoagulants: Secondary | ICD-10-CM | POA: Diagnosis not present

## 2019-12-25 DIAGNOSIS — G2581 Restless legs syndrome: Secondary | ICD-10-CM | POA: Diagnosis not present

## 2019-12-25 DIAGNOSIS — Z9221 Personal history of antineoplastic chemotherapy: Secondary | ICD-10-CM | POA: Diagnosis not present

## 2019-12-25 LAB — CMP (CANCER CENTER ONLY)
ALT: 25 U/L (ref 0–44)
AST: 27 U/L (ref 15–41)
Albumin: 3.7 g/dL (ref 3.5–5.0)
Alkaline Phosphatase: 51 U/L (ref 38–126)
Anion gap: 7 (ref 5–15)
BUN: 13 mg/dL (ref 8–23)
CO2: 25 mmol/L (ref 22–32)
Calcium: 10.6 mg/dL — ABNORMAL HIGH (ref 8.9–10.3)
Chloride: 106 mmol/L (ref 98–111)
Creatinine: 0.82 mg/dL (ref 0.44–1.00)
GFR, Est AFR Am: >60 mL/min (ref >60)
GFR, Estimated: >60 mL/min (ref >60)
Glucose, Bld: 109 mg/dL — ABNORMAL HIGH (ref 70–99)
Potassium: 4.5 mmol/L (ref 3.5–5.1)
Sodium: 138 mmol/L (ref 135–145)
Total Bilirubin: 0.3 mg/dL (ref 0.3–1.2)
Total Protein: 7 g/dL (ref 6.5–8.1)

## 2019-12-25 NOTE — Progress Notes (Signed)
[place] OFFICE PROGRESS NOTE   Diagnosis: Ovarian cancer  INTERVAL HISTORY:   [name] returns for a scheduled visit.  She feels well.  She had a urinary tract infection a few months ago.  No abdominal pain.  No difficulty with bowel function.  Objective:  Vital signs in last 24 hours:  Blood pressure 137/62, pulse 70, temperature (!) 96.3 F (35.7 C), temperature source Axillary, resp. rate 17, height 5' 7"  (1.702 m), weight 145 lb 4.8 oz (65.9 kg), SpO2 100 %.    Lymphatics: No cervical, supraclavicular, axillary, or inguinal nodes Resp: Lungs with end inspiratory rales at the left posterior base, no respiratory distress Cardio: Regular rate and rhythm GI: No hepatosplenomegaly, no mass, nontender, no apparent ascites, soft reducible fullness in the suprapubic area, no mass Vascular: No leg edema   Lab Results:  Lab Results  Component Value Date   WBC 3.6 (L) 10/19/2018   HGB 11.1 (L) 10/19/2018   HCT 34.5 (L) 10/19/2018   MCV 107.8 (H) 10/19/2018   PLT 180 10/19/2018   NEUTROABS 2.0 10/19/2018    CMP  Lab Results  Component Value Date   NA 139 12/23/2019   K 4.8 12/23/2019   CL 104 12/23/2019   CO2 28 12/23/2019   GLUCOSE 93 12/23/2019   BUN 15 12/23/2019   CREATININE 0.86 12/23/2019   CALCIUM 10.9 (H) 12/23/2019   PROT 6.9 10/19/2018   ALBUMIN 4.0 10/19/2018   AST 22 10/19/2018   ALT 18 10/19/2018   ALKPHOS 53 10/19/2018   BILITOT 0.4 10/19/2018   GFRNONAA >60 12/23/2019   GFRAA >60 12/23/2019    Imaging:  CT ABDOMEN PELVIS W CONTRAST  Result Date: 12/23/2019 CLINICAL DATA:  Restaging ovarian cancer. EXAM: CT ABDOMEN AND PELVIS WITH CONTRAST TECHNIQUE: Multidetector CT imaging of the abdomen and pelvis was performed using the standard protocol following bolus administration of intravenous contrast. CONTRAST:  134m OMNIPAQUE IOHEXOL 300 MG/ML  SOLN COMPARISON:  04/18/2019 FINDINGS: Lower chest: No acute abnormality.  Hepatobiliary: No suspicious liver abnormality identified. Previous cholecystectomy. Unchanged chronic increase caliber of the common bile duct which may reflect post cholecystectomy physiology. Pancreas: Unremarkable. No pancreatic ductal dilatation or surrounding inflammatory changes. Spleen: Normal in size without focal abnormality. Adrenals/Urinary Tract: Normal appearance of the adrenal glands. Similar cyst within upper pole of left kidney measuring 2.3 cm. No suspicious mass or hydronephrosis identified. The urinary bladder is normal. Stomach/Bowel: Stomach is unremarkable. No bowel wall thickening, inflammation or distension. Extensive sigmoid diverticulosis without acute inflammation Vascular/Lymphatic: Aortic atherosclerosis. No abdominopelvic adenopathy identified. Reproductive: Status post hysterectomy.  No adnexal mass noted. Other: No free fluid or fluid collections identified. Musculoskeletal: Within the ventral, midline abdominal wall soft tissue mass is identified anterior to the pubic symphysis measuring 5.2 x 3.3 by 3.4 cm (volume = 31 cm^3), image 59/5 and image 78/2. This is compared with 4.9 x 3.0 by 2.6 (volume = 20)cm IMPRESSION: 1. Stable to slight increase in size of ventral, midline abdominal wall soft tissue mass at the level of the pubic symphysis. 2. No signs of solid organ or nodal metastases. 3. Aortic atherosclerosis. Aortic Atherosclerosis (ICD10-I70.0). Electronically Signed   By: TKerby MoorsM.D.   On: 12/23/2019 13:27    Medications: I have reviewed the patient's current medications.   Assessment/Plan: 1.  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  Elevated CA 125.   Cycle 1 Taxol/carboplatin 08/16/2013.   Cycle 2 Taxol/carboplatin 09/06/2013.   Cycle 3 Taxol/carboplatin 09/27/2013   CT 10/11/2013 with a stable  abdominal wall mass.  Status post resection of abdominal wall mass with abdominal wall reconstruction 11/05/2013. Final pathology showed adenocarcinoma. Specimen extensively involved with adenocarcinoma which focally involved the edge of the specimen. Immunostains and Foundation 1 testing were nonspecific for a primary tumor location.  Adjuvant radiation to the abdominal wall 01/16/2014 through 02/27/2014  CT 01/15/2018 while in the emergency room for evaluation of back pain-midline abdominal wall mass above the pubic symphysis and adjacent to the bladder  Ultrasound-guided biopsy of the abdominal wall mass 01/25/2018-adenocarcinoma similar to the 2015 biopsy  PET scan 03/01/2018-hypermetabolism associated with a cystic and solid suprapubic mass. Focal uptake identified in the left L3-4 facets without underlying bony lesion. No other sites of unexpected or suspicious hypermetabolism in the neck, chest, abdomen or pelvis.  Exploratory laparotomy, resection of abdominal wall tumor, oversew of bladder peritoneum, cystoscopy 03/28/2018(findings included a cystic and solid mass encountered below the lower edge of the incision extending into the space ofretziusand adherent to the pubic arch inferiorly, bladder dome posteriorly and prior abdominal wall repair anteriorly. No nodularity palpated in the tumor bed. Visualized bowel and omentum free of disease. Pelvic peritoneum appeared normal). A "small "amount of tumor on the periosteum was cauterized using a Bovie.  Pathology on pelvic tumor-metastatic high-grade adenocarcinoma, favor clear-cell carcinoma of gynecologic origin, fragments up to 5.8 cm in size; ER negative, PR positive, 1+ (weak staining) to focally 2+, 30-40%, MSI-stable, mutation burden-4,CREBBPalteration  Cycle 1 gemcitabine/carboplatin 05/21/2018; day 8 held due to neutropenia  Cycle 2 gemcitabine/carboplatin 06/11/2018 (day 8 gemcitabine eliminated)  Cycle 3  gemcitabine/carboplatin 07/06/2018  Cycle 4 gemcitabine/carboplatin 07/27/2018 (Udenyca added)  Cycle 5 gemcitabine/carboplatin 08/17/2018  Cycle 6 gemcitabine/carboplatin 09/07/2018   CT 10/19/2018- low anterior abdominal wall mass no longer identified, soft tissue thickening cephalad to the site of prior mass-nonspecific, no additional evidence of metastatic disease  CT 04/18/2019-no evidence of recurrent or metastatic disease  CT 12/23/2019-slight increase in size of ventral midline soft tissue mass near the pubis, no other evidence of disease progression 2. Ovarian cancer in 1988, low-grade adenocarcinoma with areas of "borderline" carcinoma, status post a hysterectomy and bilateral oophorectomy. 3. New onset atrial fibrillation August 2016, maintained on eliquis 4. Recurrent urinary tract infections 5. Restless legs     Disposition: [name] appears stable.  The CA-125 is normal.  I reviewed the CT images with her.  The suprapubic mass appears slightly larger.  She appears to have areas of bowel herniation in the same region.  She understands the mass could represent a slow progression of ovarian cancer versus benign soft tissue.  She would like to proceed with a diagnostic biopsy.  The elevated calcium level from 12/23/2019 is likely a spurious finding.  She will have a repeat chemistry panel today.  [name] will return for an office visit after the diagnostic biopsy.  [name], MD  12/25/2019  10:50 AM

## 2019-12-25 NOTE — Progress Notes (Signed)
[name] Female, 74 y.o., 02/20/1946 MRN:  [id number] Phone:  [phone] (H) PCP:  Sharilyn Sites, MD Primary Cvg:  Medicare/Medicare Part A And B Next Appt With Radiology ([facility]-CT 3) 01/06/2020 at 8:00 AM  RE: CT Biopsy Received: Today Arne Cleveland, MD  Jillyn Hidden Ok   CT pelvic body wall mass core bx  Could also try [place]   DD[facility]       Previous Messages   ----- Message -----  From: Garth Bigness D  Sent: 12/25/2019  1:28 PM EDT  To: Ir Procedure Requests  Subject: CT Biopsy                     Procedure: CT Biopsy   Reason:  Malignant neoplasm of ovary, unspecified laterality, ovarian cancer, biopsy suprapubic soft tissue mass   History: CT in computer   Provider:  Ladell Pier   Provider Contact: [phone]

## 2019-12-30 ENCOUNTER — Encounter (HOSPITAL_COMMUNITY): Payer: Self-pay | Admitting: Radiology

## 2019-12-30 NOTE — Progress Notes (Signed)
[name] Female, 74 y.o., June 28, 1945 MRN:  [id number] Phone:  [phone] (H) PCP:  Sharilyn Sites, MD Primary Cvg:  Medicare/Medicare Part A And B Next Appt With Radiology (MC-CT 3) 01/06/2020 at 8:00 AM  RE: CT Biopsy Received: Today Branch, Alphonse Guild, MD  Arlyn Leak to hold eliquis    Zandra Abts MD       Previous Messages   ----- Message -----  From: Garth Bigness D  Sent: 12/25/2019  1:31 PM EDT  To: Arnoldo Lenis, MD  Subject: FW: CT Biopsy                   Good afternoon, Dr. Benay Spice has placed an order for [name] to have a biopsy done. Patient is on Eliquis and will need to hold for 2 days prior to biopsy. Just need to know if you are okay with that.   Thanks Aniceto Boss  ----- Message -----  From: Garth Bigness D  Sent: 12/25/2019  1:28 PM EDT  To: Ir Procedure Requests  Subject: CT Biopsy                     Procedure: CT Biopsy   Reason:  Malignant neoplasm of ovary, unspecified laterality, ovarian cancer, biopsy suprapubic soft tissue mass   History: CT in computer   Provider:  Ladell Pier   Provider Contact: [phone]

## 2020-01-02 ENCOUNTER — Ambulatory Visit: Payer: Medicare Other | Admitting: Nurse Practitioner

## 2020-01-03 ENCOUNTER — Other Ambulatory Visit: Payer: Self-pay | Admitting: Radiology

## 2020-01-03 DIAGNOSIS — H1032 Unspecified acute conjunctivitis, left eye: Secondary | ICD-10-CM | POA: Diagnosis not present

## 2020-01-06 ENCOUNTER — Other Ambulatory Visit: Payer: Self-pay

## 2020-01-06 ENCOUNTER — Encounter (HOSPITAL_COMMUNITY): Payer: Self-pay

## 2020-01-06 ENCOUNTER — Ambulatory Visit (HOSPITAL_COMMUNITY)
Admission: RE | Admit: 2020-01-06 | Discharge: 2020-01-06 | Disposition: A | Discharge status: Home / Self Care | Payer: Medicare Other | Source: Ambulatory Visit | Attending: Oncology | Admitting: Oncology

## 2020-01-06 DIAGNOSIS — Z9079 Acquired absence of other genital organ(s): Secondary | ICD-10-CM | POA: Insufficient documentation

## 2020-01-06 DIAGNOSIS — Z923 Personal history of irradiation: Secondary | ICD-10-CM | POA: Insufficient documentation

## 2020-01-06 DIAGNOSIS — I700 Atherosclerosis of aorta: Secondary | ICD-10-CM | POA: Insufficient documentation

## 2020-01-06 DIAGNOSIS — Z7901 Long term (current) use of anticoagulants: Secondary | ICD-10-CM | POA: Diagnosis not present

## 2020-01-06 DIAGNOSIS — Z90710 Acquired absence of both cervix and uterus: Secondary | ICD-10-CM | POA: Insufficient documentation

## 2020-01-06 DIAGNOSIS — K219 Gastro-esophageal reflux disease without esophagitis: Secondary | ICD-10-CM | POA: Insufficient documentation

## 2020-01-06 DIAGNOSIS — Z8543 Personal history of malignant neoplasm of ovary: Secondary | ICD-10-CM | POA: Diagnosis not present

## 2020-01-06 DIAGNOSIS — G2581 Restless legs syndrome: Secondary | ICD-10-CM | POA: Diagnosis not present

## 2020-01-06 DIAGNOSIS — C569 Malignant neoplasm of unspecified ovary: Secondary | ICD-10-CM

## 2020-01-06 DIAGNOSIS — C7989 Secondary malignant neoplasm of other specified sites: Secondary | ICD-10-CM | POA: Diagnosis not present

## 2020-01-06 DIAGNOSIS — R1900 Intra-abdominal and pelvic swelling, mass and lump, unspecified site: Secondary | ICD-10-CM | POA: Diagnosis present

## 2020-01-06 DIAGNOSIS — E7800 Pure hypercholesterolemia, unspecified: Secondary | ICD-10-CM | POA: Insufficient documentation

## 2020-01-06 DIAGNOSIS — I10 Essential (primary) hypertension: Secondary | ICD-10-CM | POA: Diagnosis not present

## 2020-01-06 DIAGNOSIS — C763 Malignant neoplasm of pelvis: Secondary | ICD-10-CM | POA: Diagnosis not present

## 2020-01-06 DIAGNOSIS — Z79890 Hormone replacement therapy (postmenopausal): Secondary | ICD-10-CM | POA: Insufficient documentation

## 2020-01-06 DIAGNOSIS — Z79899 Other long term (current) drug therapy: Secondary | ICD-10-CM | POA: Insufficient documentation

## 2020-01-06 DIAGNOSIS — Z90722 Acquired absence of ovaries, bilateral: Secondary | ICD-10-CM | POA: Diagnosis not present

## 2020-01-06 DIAGNOSIS — F419 Anxiety disorder, unspecified: Secondary | ICD-10-CM | POA: Insufficient documentation

## 2020-01-06 DIAGNOSIS — M7989 Other specified soft tissue disorders: Secondary | ICD-10-CM | POA: Diagnosis not present

## 2020-01-06 LAB — PROTIME-INR
INR: 1 (ref 0.8–1.2)
Prothrombin Time: 12.6 seconds (ref 11.4–15.2)

## 2020-01-06 LAB — CBC
HCT: 36.6 % (ref 36.0–46.0)
Hemoglobin: 12.1 g/dL (ref 12.0–15.0)
MCH: 32.8 pg (ref 26.0–34.0)
MCHC: 33.1 g/dL (ref 30.0–36.0)
MCV: 99.2 fL (ref 80.0–100.0)
Platelets: 214 10*3/uL (ref 150–400)
RBC: 3.69 MIL/uL — ABNORMAL LOW (ref 3.87–5.11)
RDW: 12.4 % (ref 11.5–15.5)
WBC: 4.5 10*3/uL (ref 4.0–10.5)
nRBC: 0 % (ref 0.0–0.2)

## 2020-01-06 MED ORDER — MIDAZOLAM HCL 2 MG/2ML IJ SOLN
INTRAMUSCULAR | Status: AC | PRN
  Administered 2020-01-06: 1 mg via INTRAVENOUS

## 2020-01-06 MED ORDER — HYDROCODONE-ACETAMINOPHEN 5-325 MG PO TABS
1.0000 | ORAL_TABLET | ORAL | Status: DC | PRN
  Filled 2020-01-06: qty 2

## 2020-01-06 MED ORDER — SODIUM CHLORIDE 0.9 % IV SOLN: INTRAVENOUS | Status: DC

## 2020-01-06 MED ORDER — FENTANYL CITRATE (PF) 100 MCG/2ML IJ SOLN
INTRAMUSCULAR | Status: AC
  Filled 2020-01-06: qty 2

## 2020-01-06 MED ORDER — MIDAZOLAM HCL 2 MG/2ML IJ SOLN
INTRAMUSCULAR | Status: AC
  Filled 2020-01-06: qty 2

## 2020-01-06 MED ORDER — FENTANYL CITRATE (PF) 100 MCG/2ML IJ SOLN
INTRAMUSCULAR | Status: AC | PRN
  Administered 2020-01-06: 50 ug via INTRAVENOUS

## 2020-01-06 MED ORDER — LIDOCAINE HCL 1 % IJ SOLN
INTRAMUSCULAR | Status: AC
  Administered 2020-01-06: 20 mL
  Filled 2020-01-06: qty 20

## 2020-01-06 NOTE — Discharge Instructions (Addendum)

## 2020-01-06 NOTE — H&P (Signed)
Chief Complaint: Patient was seen in consultation today for supra pubic soft tissue mass biopsy at the request of Sherrill,Gary B  Referring Physician(s): Ladell Pier  Supervising Physician: Arne Cleveland  Patient Status: [facility] - Out-pt  History of Present Illness: [name] is a 74 y.o. female   Ovarian cancer in 1988, low-grade adenocarcinoma with areas of "borderline" carcinoma, status post a hysterectomy and bilateral oophorectomy.  Pt diagnosed 2015 with Ovarian cancer per bx Resection; radiation and chemo  Final pathology showed adenocarcinoma  Recurrence 2019: cautery using Bovie  Pathology on pelvic tumor-metastatic high-grade adenocarcinoma, favor clear-cell carcinoma of gynecologic origin, fragments up to 5.8 cm in size; ER negative, PR positive, 1+ (weak staining) to focally 2+, 30-40%, MSI-stable, mutation burden-4,CREBBPalteration   Follow CT: CT 04/18/2019-no evidence of recurrent or metastatic disease CT 12/23/2019-slight increase in size of ventral midline soft tissue mass near the pubis, no other evidence of disease progression  Dr Benay Spice requesting biopsy of same Imaging reviewed and approved for bx Denies abd pain  or N/V  Past Medical History:  Diagnosis Date  . Anxiety    new dx  . Cancer ([place]) 1988, 2015   ovarian, adenocarcinoma  . GERD (gastroesophageal reflux disease)    hx of years ago  . Hx of radiation therapy 01/16/14-02/27/14   abdomen   . Hypercholesteremia    under control with diet and fish oil  . Hypertension   . Ovarian cyst   . PONV (postoperative nausea and vomiting)   . Restless leg syndrome   . Thyroid disease     Past Surgical History:  Procedure Laterality Date  . APPENDECTOMY    . APPLICATION OF A-CELL OF CHEST/ABDOMEN N/A 11/05/2013   Procedure: ABDOMINAL WALL RESCONTRUCTION WITH STRATUS;  Surgeon: Irene Limbo, MD;  Location: [facility] ORS;  Service: Plastics;  Laterality: N/A;  . CHOLECYSTECTOMY  2009   . LAPAROTOMY N/A 11/05/2013   Procedure: RESECTION OF PELVIC MASS;  Surgeon: Imagene Gurney A. Alycia Rossetti, MD;  Location: [facility] ORS;  Service: Gynecology;  Laterality: N/A;  . OOPHORECTOMY     BSO  . TONSILLECTOMY  as child  . TOTAL ABDOMINAL HYSTERECTOMY  1988   ovarian cyst  BSO    Allergies: Patient has no known allergies.  Medications: Prior to Admission medications   Medication Sig Start Date End Date Taking? Authorizing Provider  ALPRAZolam (XANAX) 0.25 MG tablet Take 0.25-0.5 mg by mouth at bedtime.    Yes [provider]  cholecalciferol (VITAMIN D3) 25 MCG (1000 UNIT) tablet Take 1,000 Units by mouth daily.   Yes [provider]  ELIQUIS 5 MG TABS tablet TAKE (1) TABLET BY MOUTH TWICE DAILY. Patient taking differently: Take 5 mg by mouth 2 (two) times daily.  08/19/19  Yes BranchAlphonse Guild, MD  HYDROcodone-acetaminophen (NORCO/VICODIN) 5-325 MG tablet Take 0.5-1 tablets by mouth at bedtime.  07/29/19  Yes [provider]  levothyroxine (SYNTHROID, LEVOTHROID) 75 MCG tablet Take 75 mcg by mouth every morning.    Yes [provider]  metoprolol tartrate (LOPRESSOR) 25 MG tablet TAKE (1/2) TABLET BY MOUTH TWICE DAILY. Patient taking differently: Take 12.5 mg by mouth 2 (two) times daily.  09/26/19  Yes BranchAlphonse Guild, MD  Multiple Vitamin (MULTIVITAMIN WITH MINERALS) TABS tablet Take 1 tablet by mouth daily.   Yes [provider]  Omega-3 Fatty Acids (FISH OIL) 1200 MG CAPS Take 1,200 mg by mouth daily.   Yes [provider]  PREMARIN 0.625 MG tablet TAKE  1 TABLET ONCE DAILY FOR 21 DAYS, THEN OFF 7 DAYS AS DIRECTED. Patient taking differently: Take 0.625 mg by mouth every Monday.  07/24/19  Yes Ladell Pier, MD  rOPINIRole (REQUIP) 0.25 MG tablet Take 0.75 mg by mouth at bedtime.  12/04/17  Yes [provider]  acetaminophen (TYLENOL) 650 MG CR tablet Take 650 mg by mouth daily as needed for pain.    [provider]      Family History  Problem Relation Age of Onset  . Heart disease Mother   . Heart disease Sister   . Diabetes Sister   . Cancer Sister        melanoma-skin cancer  . Breast cancer Paternal Grandmother        Age 48's  . Cancer Paternal Grandmother        breast  . Cancer Paternal Grandfather        prostate/bladder    Social History   Socioeconomic History  . Marital status: Single    Spouse name: Not on file  . Number of children: Not on file  . Years of education: Not on file  . Highest education level: Not on file  Occupational History  . Not on file  Tobacco Use  . Smoking status: Never Smoker  . Smokeless tobacco: Never Used  Vaping Use  . Vaping Use: Never used  Substance and Sexual Activity  . Alcohol use: No    Alcohol/week: 0.0 standard drinks  . Drug use: No  . Sexual activity: Never    Birth control/protection: Surgical    Comment: HYST  Other Topics Concern  . 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 Difficulty of Paying Living Expenses: Not on file  Food Insecurity:   . Worried About Charity fundraiser in the Last Year: Not on file  . Ran Out of Food in the Last Year: Not on file  Transportation Needs:   . Lack of Transportation (Medical): Not on file  . Lack of Transportation (Non-Medical): Not on file  Physical Activity:   . Days of Exercise per Week: Not on file  . Minutes of Exercise per Session: Not on file  Stress:   . Feeling of Stress : Not on file  Social Connections:   . Frequency of Communication with Friends and Family: Not on file  . Frequency of Social Gatherings with Friends and Family: Not on file  . Attends Religious Services: Not on file  . Active Member of Clubs or  Organizations: Not on file  . Attends Archivist Meetings: Not on file  . Marital Status: Not on file    Review of Systems: A 12 point ROS discussed and pertinent positives are indicated in the HPI above.  All other systems are negative.  Review of Systems  Constitutional: Negative for activity change, fever and unexpected weight change.  Respiratory: Negative for shortness of breath.   Cardiovascular: Negative for chest pain.  Gastrointestinal: Negative for abdominal pain, blood in stool, nausea and vomiting.  Musculoskeletal: Negative for back pain.  Neurological: Negative for weakness.  Psychiatric/Behavioral: Negative for behavioral problems and confusion.    Vital Signs: BP 122/60   Pulse (!) 52  Temp 97.9 F (36.6 C) (Oral)   Resp 14   Ht 5' 6.5" (1.689 m)   Wt 145 lb (65.8 kg)   SpO2 100%   BMI 23.05 kg/m   Physical Exam Vitals reviewed.  HENT:     Mouth/Throat:     Mouth: Mucous membranes are moist.  Cardiovascular:     Rate and Rhythm: Normal rate and regular rhythm.     Heart sounds: Normal heart sounds.  Pulmonary:     Effort: Pulmonary effort is normal.     Breath sounds: Normal breath sounds.  Abdominal:     Palpations: Abdomen is soft.  Musculoskeletal:        General: Normal range of motion.  Skin:    General: Skin is warm.  Neurological:     Mental Status: She is alert and oriented to person, place, and time.  Psychiatric:        Behavior: Behavior normal.     Imaging: CT ABDOMEN PELVIS W CONTRAST  Result Date: 12/23/2019 CLINICAL DATA:  Restaging ovarian cancer. EXAM: CT ABDOMEN AND PELVIS WITH CONTRAST TECHNIQUE: Multidetector CT imaging of the abdomen and pelvis was performed using the standard protocol following bolus administration of intravenous contrast. CONTRAST:  131m OMNIPAQUE IOHEXOL 300 MG/ML  SOLN COMPARISON:  04/18/2019 FINDINGS: Lower chest: No acute abnormality. Hepatobiliary: No suspicious liver abnormality  identified. Previous cholecystectomy. Unchanged chronic increase caliber of the common bile duct which may reflect post cholecystectomy physiology. Pancreas: Unremarkable. No pancreatic ductal dilatation or surrounding inflammatory changes. Spleen: Normal in size without focal abnormality. Adrenals/Urinary Tract: Normal appearance of the adrenal glands. Similar cyst within upper pole of left kidney measuring 2.3 cm. No suspicious mass or hydronephrosis identified. The urinary bladder is normal. Stomach/Bowel: Stomach is unremarkable. No bowel wall thickening, inflammation or distension. Extensive sigmoid diverticulosis without acute inflammation Vascular/Lymphatic: Aortic atherosclerosis. No abdominopelvic adenopathy identified. Reproductive: Status post hysterectomy.  No adnexal mass noted. Other: No free fluid or fluid collections identified. Musculoskeletal: Within the ventral, midline abdominal wall soft tissue mass is identified anterior to the pubic symphysis measuring 5.2 x 3.3 by 3.4 cm (volume = 31 cm^3), image 59/5 and image 78/2. This is compared with 4.9 x 3.0 by 2.6 (volume = 20)cm IMPRESSION: 1. Stable to slight increase in size of ventral, midline abdominal wall soft tissue mass at the level of the pubic symphysis. 2. No signs of solid organ or nodal metastases. 3. Aortic atherosclerosis. Aortic Atherosclerosis (ICD10-I70.0). Electronically Signed   By: TKerby MoorsM.D.   On: 12/23/2019 13:27    Labs:  CBC: No results for input(s): WBC, HGB, HCT, PLT in the last 8760 hours.  COAGS: No results for input(s): INR, APTT in the last 8760 hours.  BMP: Recent Labs    04/18/19 1000 12/23/19 1010 12/25/19 1118  NA 140 139 138  K 4.8 4.8 4.5  CL 103 104 106  CO2 28 28 25   GLUCOSE 89 93 109*  BUN 17 15 13   CALCIUM 10.1 10.9* 10.6*  CREATININE 0.81 0.86 0.82  GFRNONAA >60 >60 >60  GFRAA >60 >60 >60    LIVER FUNCTION TESTS: Recent Labs    12/25/19 1118  BILITOT 0.3  AST 27    ALT 25  ALKPHOS 51  PROT 7.0  ALBUMIN 3.7    TUMOR MARKERS: No results for input(s): AFPTM, CEA, CA199, CHROMGRNA in the last 8760 hours.  Assessment and Plan:  Hx Ovarian Cancer 1988 Recurrence 2015 Recurrence 2019 Follow up CT revealing enlarging  suprapubic mass Scheduled for bx of same Risks and benefits of supra pubic mass biopsy was discussed with the patient and/or patient's family including, but not limited to bleeding, infection, damage to adjacent structures or low yield requiring additional tests.  All of the questions were answered and there is agreement to proceed. Consent signed and in chart.    Thank you for this interesting consult.  I greatly enjoyed meeting [name] and look forward to participating in their care.  A copy of this report was sent to the requesting provider on this date.  Electronically Signed: Lavonia Drafts, PA-C 01/06/2020, 7:20 AM   I spent a total of  30 Minutes   in face to face in clinical consultation, greater than 50% of which was counseling/coordinating care for supra pubic mass biopsy

## 2020-01-06 NOTE — Procedures (Signed)
  Procedure: CT Core biopsy anterior pelvic body wall mass   EBL:   minimal Complications:  none immediate  See full dictation in [facility].  Dillard Cannon MD Main # [phone] Pager  [phone]

## 2020-01-09 ENCOUNTER — Inpatient Hospital Stay (HOSPITAL_BASED_OUTPATIENT_CLINIC_OR_DEPARTMENT_OTHER): Payer: Medicare Other | Admitting: Nurse Practitioner

## 2020-01-09 ENCOUNTER — Telehealth: Payer: Self-pay | Admitting: *Deleted

## 2020-01-09 ENCOUNTER — Other Ambulatory Visit: Payer: Self-pay

## 2020-01-09 ENCOUNTER — Encounter: Payer: Self-pay | Admitting: Nurse Practitioner

## 2020-01-09 VITALS — BP 127/60 | HR 58 | Temp 97.8°F | Resp 17 | Ht 66.5 in | Wt 145.1 lb

## 2020-01-09 DIAGNOSIS — Z87440 Personal history of urinary (tract) infections: Secondary | ICD-10-CM | POA: Diagnosis not present

## 2020-01-09 DIAGNOSIS — C569 Malignant neoplasm of unspecified ovary: Secondary | ICD-10-CM | POA: Diagnosis not present

## 2020-01-09 DIAGNOSIS — I4891 Unspecified atrial fibrillation: Secondary | ICD-10-CM | POA: Diagnosis not present

## 2020-01-09 DIAGNOSIS — G2581 Restless legs syndrome: Secondary | ICD-10-CM | POA: Diagnosis not present

## 2020-01-09 DIAGNOSIS — Z7901 Long term (current) use of anticoagulants: Secondary | ICD-10-CM | POA: Diagnosis not present

## 2020-01-09 DIAGNOSIS — Z9221 Personal history of antineoplastic chemotherapy: Secondary | ICD-10-CM | POA: Diagnosis not present

## 2020-01-09 MED ORDER — ESTROGENS CONJUGATED 0.625 MG PO TABS: 0.6250 mg | ORAL_TABLET | ORAL | 2 refills | Status: DC

## 2020-01-09 NOTE — Progress Notes (Addendum)
[name]   Diagnosis: Ovarian cancer  INTERVAL HISTORY:   [name] returns as scheduled.  She feels well.  No abdominal pain.  No nausea or vomiting.  Bowels moving regularly.  She has a good appetite.  Objective:  Vital signs in last 24 hours:  Blood pressure 127/60, pulse (!) 58, temperature 97.8 F (36.6 C), temperature source Axillary, resp. rate 17, height 5' 6.5" (1.689 m), weight 145 lb 1.6 oz (65.8 kg), SpO2 100 %.    HEENT: No thrush or ulcers. Resp: Lungs clear bilaterally. Cardio: Regular rate and rhythm. GI: Abdomen soft and nontender.  No hepatomegaly.  No mass. Vascular: No leg edema.   Lab Results:  Lab Results  Component Value Date   WBC 4.5 01/06/2020   HGB 12.1 01/06/2020   HCT 36.6 01/06/2020   MCV 99.2 01/06/2020   PLT 214 01/06/2020   NEUTROABS 2.0 10/19/2018    Imaging:  No results found.  Medications: I have reviewed the patient's current medications.  Assessment/Plan: 1.  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   Elevated CA 125.   Cycle 1 Taxol/carboplatin 08/16/2013.   Cycle 2 Taxol/carboplatin 09/06/2013.   Cycle 3 Taxol/carboplatin 09/27/2013   CT 10/11/2013 with a stable abdominal wall mass.  Status post resection of abdominal wall mass with abdominal wall reconstruction 11/05/2013. Final pathology showed adenocarcinoma. Specimen extensively involved with adenocarcinoma which focally involved the edge of the specimen. Immunostains and Foundation 1 testing were nonspecific for a primary tumor location.  Adjuvant radiation to the abdominal wall 01/16/2014 through 02/27/2014  CT 01/15/2018 while in the emergency room for evaluation of back pain-midline abdominal wall mass above the pubic symphysis and adjacent to the  bladder  Ultrasound-guided biopsy of the abdominal wall mass 01/25/2018-adenocarcinoma similar to the 2015 biopsy  PET scan 03/01/2018-hypermetabolism associated with a cystic and solid suprapubic mass. Focal uptake identified in the left L3-4 facets without underlying bony lesion. No other sites of unexpected or suspicious hypermetabolism in the neck, chest, abdomen or pelvis.  Exploratory laparotomy, resection of abdominal wall tumor, oversew of bladder peritoneum, cystoscopy 03/28/2018(findings included a cystic and solid mass encountered below the lower edge of the incision extending into the space ofretziusand adherent to the pubic arch inferiorly, bladder dome posteriorly and prior abdominal wall repair anteriorly. No nodularity palpated in the tumor bed. Visualized bowel and omentum free of disease. Pelvic peritoneum appeared normal). A "small "amount of tumor on the periosteum was cauterized using a Bovie.  Pathology on pelvic tumor-metastatic high-grade adenocarcinoma, favor clear-cell carcinoma of gynecologic origin, fragments up to 5.8 cm in size; ER negative, PR positive, 1+ (weak staining) to focally 2+, 30-40%, MSI-stable, mutation burden-4,CREBBPalteration  Cycle 1 gemcitabine/carboplatin 05/21/2018; day 8 held due to neutropenia  Cycle2 gemcitabine/carboplatin 06/11/2018 (day 8 gemcitabine eliminated)  Cycle 3 gemcitabine/carboplatin 07/06/2018  Cycle 4 gemcitabine/carboplatin 07/27/2018 (Udenyca added)  Cycle 5 gemcitabine/carboplatin 08/17/2018  Cycle 6 gemcitabine/carboplatin 09/07/2018   CT 10/19/2018- low anterior abdominal wall mass no longer identified, soft tissue thickening cephalad to the site of prior mass-nonspecific, no additional evidence of metastatic disease  CT 04/18/2019-no evidence of recurrent or metastatic disease  CT 12/23/2019-slight increase in size of ventral midline soft tissue mass near the pubis, no other evidence of disease progression  Biopsy  pelvic body wall soft tissue mass 01/06/2020-metastatic adenocarcinoma consistent with metastatic  clear-cell carcinoma of gynecologic origin 2. Ovarian cancer in 1988, low-grade adenocarcinoma with areas of "borderline" carcinoma, status post a hysterectomy and bilateral oophorectomy. 3. New onset atrial fibrillation August 2016, maintained on eliquis 4. Recurrent urinary tract infections 5. Restless legs  Disposition: [name] appears stable.  Recent biopsy of the suprapubic mass confirms metastatic adenocarcinoma.  She remains asymptomatic.  Pathology result was reviewed with [name] and [name] at today's visit.  Dr. Benay Spice recommends a referral to GYN oncology to see if she is a candidate for surgery.  We will also follow-up with genetics to see if she has had genetic testing in the past.  She will return for follow-up in 3 weeks.  Patient seen with Dr. Benay Spice.   Ned Card ANP/GNP-BC   01/09/2020  9:47 AM  This was a shared visit with Ned Card.  We discussed the pathology findings with [name] and [name].  She appears to have another local recurrence of the ovarian cancer.  The tumor continues to behave in an indolent fashion and there is no clinical evidence of distant tumor spread.  [name] would like to consider another resection procedure.  I think it is unlikely surgery will be curative at this point.  We will refer Danielle to GYN oncology.  We will be sure she has had BRCA testing.  She will return for an office visit and further discussion in 3 weeks.  We will consider observation versus a trial of systemic therapy if surgery is not an option.   Danielle Manson, MD

## 2020-01-09 NOTE — Telephone Encounter (Signed)
Patient returned my call and scheduled a new patient appt for 9/3 at 9:45 am with Dr Berline Lopes.

## 2020-01-10 ENCOUNTER — Other Ambulatory Visit: Payer: Self-pay | Admitting: Oncology

## 2020-01-10 DIAGNOSIS — Z23 Encounter for immunization: Secondary | ICD-10-CM | POA: Diagnosis not present

## 2020-01-13 ENCOUNTER — Telehealth: Payer: Self-pay | Admitting: Genetic Counselor

## 2020-01-13 ENCOUNTER — Other Ambulatory Visit: Payer: Self-pay | Admitting: Genetic Counselor

## 2020-01-13 DIAGNOSIS — C569 Malignant neoplasm of unspecified ovary: Secondary | ICD-10-CM

## 2020-01-13 NOTE — Telephone Encounter (Signed)
error 

## 2020-01-16 NOTE — Progress Notes (Signed)
GYNECOLOGIC ONCOLOGY NEW PATIENT CONSULTATION   Patient Name: [name]  Patient Age: 74 y.o. Date of Service: 01/17/20 Referring Provider: Dr. 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  The patient is presenting with second recurrence of adenocarcinoma of the anterior abdominal wall. Luckily, current recurrence is completely asymptomatic and was identified on surveillance imaging. This seems to be an indolent cancer (whether it is truly an adenocarcinoma of the anterior abdominal wall or a very remote recurrence of her ovarian cancer) and luckily her recurrences have been localized.   She is just under two years from her last recurrence treated with resection follow by systemic treatment. The current recurrence appears to be in a very similar location to her last and I think is amenable to resection. We discussed doing this here versus at [facility] (where she had her last resection), and we will proceed with scheduling surgery for later this month at [facility]. She understands that she will need both a pre-op appointment and COVID testing there. She received her booster COVID vaccine last week.  The patient's medical oncologist has discussed germline genetic testing, which will be done here. Tissue from her last two resections was sent for genomic testing with no actionable mutation identified. We will plan to send again after upcoming resection.   We discussed holding her Eliquis 48hours before surgery and will plan to restart it likely 2-3 days post-op depending of risk of postoperative bleeding.  A copy of this note was sent to the patient's referring provider.   55 minutes of total time was spent for this patient encounter, including preparation, face-to-face counseling with the patient and coordination of care, and documentation of the encounter.   Jeral Pinch, MD  Division of Gynecologic Oncology  Department of Obstetrics and Gynecology  [facility]  ___________________________________________  Chief Complaint: No chief complaint on file.   History of Present Illness:  [name] is a 74 y.o. y.o. female who is seen in consultation at the request of Dr. Benay Spice for an evaluation of recurrent adenocarcinoma of the abdominal wall.  The patient has quite an interesting history, outlined below.  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  This was early stage disease and low-grade and she did not receive adjuvant therapy.  She presented in early 2015 with an abdominal mass and resulting pain.  The mass was located along the right inferior rectus muscle.  Biopsy of this mass showed adenocarcinoma of unknown primary.  She received 3 cycles of neoadjuvant carboplatin and paclitaxel without change in the size of the mass.  She then underwent resection of the mass with abdominal wall reconstruction (plastic surgery) and then underwent targeted radiation therapy due to positive tumor margin.  Adjuvant treatment was completed in October 2015.  Her Ca1 25 was elevated in early 2015 and had normalized by the time she completed radiation therapy after resection.  She then presented in late 2019 again with pain and a complex suprapubic mass on imaging.  Biopsy confirmed adenocarcinoma.  She underwent resection in late 2019 confirming recurrent disease.  She was then treated with 6 cycles of adjuvant gemcitabine and carboplatin which she completed in April 2020.  Foundation 1 testing was done after both resections and no targetable mutation noted.  She overall has been doing well  in her usual state of health without evidence of disease.  She had imaging in late 2020 that showed no evidence of recurrence.  Plan was for imaging for  surveillance.  Unfortunately, on her recent imaging, there is a new suprapubic mass concerning for recurrence.  This has been biopsied and consistent with metastatic adenocarcinoma.  Patient reports overall doing well.  She denies any symptoms including pain.  She reports normal bowel and bladder function.  She has a good appetite without nausea or emesis.  She denies any vaginal bleeding or discharge.  She denies any surgery since 2019.  Her atrial fibrillation is well controlled and she only very occasionally feels heart palpitations.  She denies any significant pulmonary disease and denies shortness of breath or chest pain with ambulation.  She was recently treated for an eye infection.  She has had both of her Covid vaccines and received her booster vaccine last Friday.  Treatment History: Oncology History Overview Note  History of "low grade ovarian cancer" diagnosed incidentally on hysterectomy in 1988. Pathology report says "focal well differentiated serous cystadenocarcinoma". The comment is that the adenocarcinomatous component is limited to rare foci of small invasive glandular structures. Another report states that there is a small focus of papillary adenocarcinoma in a serous cystadenofibroma of the left ovary. Uterus, cervix, left fallopian tube, right adnexa and pelvic washings all without evidence of malignancy. Appendix also removed at the time of surgery.  Her CA-125 at that time was reported to be 33. She did not receive adjuvant therapy for her initial cancer diagnosis.  She presented in 07/2013 with abdominal pain and mass was noted along right inferior rectus muscle. Biopsy of the mass showed adenocarcinoma of unknown primary. She received 3 cycles of neoadjuvant chemotherapy (no response), carboplatin/taxol. After resection, she underwent RT due to positie tumor margin. This was completed in 02/2014.  CA-125: 08/07/13: 128 09/27/13: 147 12/20/13: 12.9 03/28/14: 9 07/31/14:  8 01/2015: 8 07/2015: 17.3 11/11/15: 16.9 09/2016: 16.9 02/2017: 19.4 08/2017: 19.2 01/2018: 19.2 02/2018: 19.5 08/2018: 19.2 01/2019: 18.1 04/2019: 19.5 08/22/19: 18.6 12/23/19: 24.4   Neoplasm of abdominal wall of uncertain behavior  09/14/8839 Imaging   Ct A/P: 1. Lower abdominal/suprapubic ventral wall lobulated soft tissue  mass, arising within the lower rectus musculature more so on the  right. 5.5 x 7.9 x 9.7 cm. Favor sarcoma or other connective tissue  tumor. This should be amenable to percutaneous biopsy. In a younger  female patient, endometriosis within a Cesarean section scar would  also be a consideration for this appearance.  2. Narrow fat plane between the mass in #1 and underlying small  bowel and bladder. No lymphadenopathy. No ascites.  3. Surgically absent gallbladder, uterus, and adnexa. Diverticulosis  of the colon.  Study discussed by telephone with PA BENJAMIN MANN on 07/19/2013 at  11:35 .    07/23/2013 Initial Diagnosis   Neoplasm of abdominal wall of uncertain behavior   6/60/6301 Initial Biopsy   Soft Tissue Needle Core Biopsy, abdominal wall mass - METASTATIC ADENOCARCINOMA INVOLVING SOFT TISSUE, SEE COMMENT. Microscopic Comment Needle core biopsies demonstrate diffuse involvement by well differentiated adenocarcinoma. The adenocarcinoma has the following immunophenotype: Cytokeratin 7 - strong diffuse expression Cytokeratin 20 - negative expression CDX2 - negative expression TTF-1 - negative expression Estrogen receptor - negative expression Vimentin - patchy mild to moderate expression   08/16/2013 - 09/27/2013 Chemotherapy   3 cycle of carboplatin, paclitaxel    10/11/2013 Imaging   CT A/P: A heterogeneous mass  centered in the inferior right rectus abdominus  muscle measures 5.3 x 7.5 cm (previously 5.5 x 7.9 cm). No  pathologically enlarged lymph nodes. Scattered atherosclerotic  calcification of the arterial vasculature without abdominal aortic   aneurysm. No free fluid. No worrisome lytic or sclerotic lesions.  Degenerative changes are seen in the spine.   IMPRESSION:  Right rectus abdominus mass is stable to very minimally smaller than  on 07/19/2013.   11/05/2013 Surgery   Resection abdominal wall mass, abdominal wall reconstruction with Strattice matrix (10x11cm)  Operative findings: 8 involving the subcutaneous tissues, fascia, and rectus on the right side. No obvious intraperitoneal disease. Normal-appearing omentum   11/05/2013 Pathology Results   Soft tissue mass, simple excision, abdominal wall mass ADENOCARCINOMA. Microscopic Comment The specimen is extensively involved by adenocarcinoma which focally involves the edge of the specimen. Immunohistochemistry is performed and the tumor shows patchy positivity with Napsin-A and is negative with WT-1, estrogen receptor, progesterone receptor, gross disease cystic fluid protein, CDX-2, carcinoembryonic antigen, thyroid transcription factor-1, CD10 and CD117. The immunophenotype is nonspecific and additional immunohistochemistry will be performed and reported as an addendum. (JDP:kh 11/07/13) ADDENDUM: Additional immunohistochemistry is performed and the tumor is positive with cytokeratin AE1/AE3 and cytokeratin 7 and is negative with Calretinin, cytokeratin 5/6 and cytokeratin 20. The immunoreactivity is not specific for location of a primary tumor. (JDP:kh 11-08-13)   11/05/2013 Genetic Testing   Foundation One testing MYC amplification - equivocal CREBBP Q1765* No FDA approved therapies in patient's tumor type or another tumor type    01/16/2014 - 02/27/2014 Radiation Therapy   45 gray to the target region within the abdominal wall postoperative area. She received 45 gray using a 4 field 3-D conformal technique at 1.8 gray per fraction. The patient then received a boost using a cone down 4 field technique for an additional 9 Gray. The final dose was 54 gray.   01/29/2018  Pathology Results   Soft Tissue Needle Core Biopsy, ant abd wall mass - ADENOCARCINOMA. - SEE MICROSCOPIC DESCRIPTION. Microscopic Comment The core biopsies consist of abundant dense, hyalinized fibrous tissue with scattered glands with cytologic atypia consistent with adenocarcinoma. Immunohistochemistry shows the tumor is positive with cytokeratin AE1/AE3 and cytokeratin 7 and is negative with cytokeratin 5/6, Calretinin, WT-1, estrogen receptor, progesterone receptor and cytokeratin 20. The morphology and immunophenotype are similar to the morphology in the abdominal wall mass excision from 11/07/2013 [phone]).   03/01/2018 Imaging   PET: 1. Hypermetabolism associated with the cystic and solid suprapubic mass compatible with the reported clinical history of adenocarcinoma recurrence. Cystic components in the cranial aspect of the lesion are largely devoid of FDG accumulation with the most hypermetabolic tissue being inferiorly, immediately adjacent to the symphysis pubis. 2. Focal uptake identified in the left L3-4 facets without underlying bony lesion. This is presumed to represent uptake related to degenerative change. 3. No other sites of unexpected or suspicious hypermetabolism in the neck, chest, abdomen, or pelvis   03/02/2018 Imaging   CT A/P: 6.7 cm mass in the lower anterior abdominal wall containing cystic and solid components, suspicious for recurrence of previously resected/treated adenocarcinoma in this region. This abuts and could invade the anterior wall of the bladder.   No evidence for more distant metastatic disease within the abdomen or pelvis.    03/28/2018 Surgery   Exploratory laparotomy, resection abdominal wall tumor, oversew of bladder peritoneum, cystoscopy.  Findings: normal external genitalia, unable to palpate mass on exam. After opening the fascia, a cystic and  solid mass was encountered below the lower edge of the incision, extending into the space  of retzius and adherent to the pubic arch inferiorly, the bladder dome posteriorly, and prior abdominal wall repair anteriorly. At conclusion of the case, no nodularity was palpated in the tumor bed. Visualized bowel and omentum were free of disease. Pelvic peritoenum appeared normal. Cystoscopy revealed intact bladder mucosa with no visible suture. Bilateral ureteral orfices visualized.   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 Additional immunohistochemical studies performed on block A5 in the current specimen demonstrates that the tumor is positive for Napsin A and PAX-8, and is negative for TTF-1. 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   03/28/2018 Genetic Testing   Foundation One No reportable alterations with companion diagnostic claims MS-stable, TMB - 4 Muts/Mb, CREBBP P7948*   05/21/2018 -  Chemotherapy   The patient had palonosetron (ALOXI) injection 0.25 mg, 0.25 mg, Intravenous,  Once, 6 of 6 cycles Administration: 0.25 mg (05/21/2018), 0.25 mg (07/27/2018), 0.25 mg (06/11/2018), 0.25 mg (07/06/2018), 0.25 mg (08/17/2018), 0.25 mg (09/07/2018) pegfilgrastim-cbqv (UDENYCA) injection 6 mg, 6 mg, Subcutaneous, Once, 3 of 3 cycles Administration: 6 mg (07/30/2018), 6 mg (09/08/2018) CARBOplatin (PARAPLATIN) 310 mg in sodium chloride 0.9 % 250 mL chemo infusion, 310 mg (100 % of original dose 306.4 mg), Intravenous,  Once, 6 of 6 cycles Dose  modification:   (original dose 306.4 mg, Cycle 1),   (original dose 306.4 mg, Cycle 4) Administration: 310 mg (05/21/2018), 300 mg (07/27/2018), 300 mg (06/11/2018), 300 mg (07/06/2018), 300 mg (08/17/2018), 300 mg (09/07/2018) gemcitabine (GEMZAR) 1,368 mg in sodium chloride 0.9 % 250 mL chemo infusion, 800 mg/m2 = 1,368 mg (100 % of original dose 800 mg/m2), Intravenous,  Once, 6 of 6 cycles Dose modification: 800 mg/m2 (original dose 800 mg/m2, Cycle 1, Reason: Provider Judgment) Administration: 1,368 mg (05/21/2018), 1,368 mg (07/27/2018), 1,368 mg (06/11/2018), 1,368 mg (07/06/2018), 1,368 mg (08/17/2018), 1,368 mg (09/07/2018)  for chemotherapy treatment.     Pathology Results   A. SOFT TISSUE MASS, PELVIC BODY WALL, NEEDLE CORE BIOPSY:  - Metastatic adenocarcinoma, see comment.   COMMENT:   The tumor has malignant cells with cleared cytoplasm.  Immunohistochemistry is positive for cytokeratin 7, PAX8, racemase, and NapsinA. The cells are negative for ER, WT-1, and CD10. 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 Dr. Benay Spice was notified on 01/08/20.    10/19/2018 Imaging   CT A/P: 1. Previously noted lower anterior abdominal wall mass is no longer identified, presumably surgically resected. Cephalad to the site of the prior mass within the low anterior abdominal wall there is some soft tissue thickening seen on today's examination (axial image 54 of series 2). This is nonspecific and will serve as a baseline for future follow-up examinations. 2. No signs of metastatic disease elsewhere in the abdomen or pelvis. 3. Colonic diverticulosis without evidence of acute diverticulitis at this time. 4. Aortic atherosclerosis. 5. Additional incidental findings, as  above.   04/18/2019 Imaging   CT A/P: 1. No evidence of recurrent or metastatic carcinoma within the abdomen or pelvis. 2. Colonic diverticulosis. No  radiographic evidence of diverticulitis.   12/23/2019 Imaging   CT A/P: Within the ventral, midline abdominal wall soft tissue mass is identified anterior to the pubic symphysis measuring 5.2 x 3.3 by 3.4 cm (volume = 31 cm^3), image 59/5 and image 78/2. This is compared with 4.9 x 3.0 by 2.6 (volume = 20)cm IMPRESSION: 1. Stable to slight increase in size of ventral, midline abdominal wall soft tissue mass at the level of the pubic symphysis. 2. No signs of solid organ or nodal metastases. 3. Aortic atherosclerosis.   Adenocarcinoma of abdominal wall, unclear primary  08/09/2013 Initial Diagnosis   Adenocarcinoma of abdominal wall, unclear primary   08/16/2013 - 09/25/2013 Chemotherapy   paclitaxel and carboplatin   11/05/2013 Surgery   resection abdominal wall mass    - 02/27/2014 Radiation Therapy   Completed volume directed radiation   Malignant neoplasm of ovary ([place])  05/03/2018 Initial Diagnosis   Malignant neoplasm of ovary ([place])   05/21/2018 -  Chemotherapy   The patient had palonosetron (ALOXI) injection 0.25 mg, 0.25 mg, Intravenous,  Once, 6 of 6 cycles Administration: 0.25 mg (05/21/2018), 0.25 mg (07/27/2018), 0.25 mg (06/11/2018), 0.25 mg (07/06/2018), 0.25 mg (08/17/2018), 0.25 mg (09/07/2018) pegfilgrastim-cbqv (UDENYCA) injection 6 mg, 6 mg, Subcutaneous, Once, 3 of 3 cycles Administration: 6 mg (07/30/2018), 6 mg (09/08/2018) CARBOplatin (PARAPLATIN) 310 mg in sodium chloride 0.9 % 250 mL chemo infusion, 310 mg (100 % of original dose 306.4 mg), Intravenous,  Once, 6 of 6 cycles Dose modification:   (original dose 306.4 mg, Cycle 1),   (original dose 306.4 mg, Cycle 4) Administration: 310 mg (05/21/2018), 300 mg (07/27/2018), 300 mg (06/11/2018), 300 mg (07/06/2018), 300 mg (08/17/2018), 300 mg (09/07/2018) gemcitabine (GEMZAR) 1,368 mg in sodium chloride 0.9 % 250 mL chemo infusion, 800 mg/m2 = 1,368 mg (100 % of original dose 800 mg/m2), Intravenous,  Once, 6 of 6 cycles Dose  modification: 800 mg/m2 (original dose 800 mg/m2, Cycle 1, Reason: Provider Judgment) Administration: 1,368 mg (05/21/2018), 1,368 mg (07/27/2018), 1,368 mg (06/11/2018), 1,368 mg (07/06/2018), 1,368 mg (08/17/2018), 1,368 mg (09/07/2018)  for chemotherapy treatment.      PAST MEDICAL HISTORY:  Past Medical History:  Diagnosis Date  . Anxiety    new dx  . Cancer ([place]) 1988, 2015   ovarian, adenocarcinoma  . GERD (gastroesophageal reflux disease)    hx of years ago  . Hx of radiation therapy 01/16/14-02/27/14   abdomen   . Hypercholesteremia    under control with diet and fish oil  . Hypertension   . Ovarian cyst   . PONV (postoperative nausea and vomiting)   . Restless leg syndrome   . Thyroid disease      PAST SURGICAL HISTORY:  Past Surgical History:  Procedure Laterality Date  . APPENDECTOMY    . APPLICATION OF A-CELL OF CHEST/ABDOMEN N/A 11/05/2013   Procedure: ABDOMINAL WALL RESCONTRUCTION WITH STRATUS;  Surgeon: Irene Limbo, MD;  Location: [facility];  Service: Plastics;  Laterality: N/A;  . CHOLECYSTECTOMY  2009  . LAPAROTOMY N/A 11/05/2013   Procedure: RESECTION OF PELVIC MASS;  Surgeon: Imagene Gurney A. Alycia Rossetti, MD;  Location: [facility];  Service: Gynecology;  Laterality: N/A;  . OOPHORECTOMY     BSO  . TONSILLECTOMY  as child  . TOTAL ABDOMINAL HYSTERECTOMY  1988   ovarian cyst  BSO  OB/GYN HISTORY:  OB History  Gravida Para Term Preterm AB Living  0            SAB TAB Ectopic Multiple Live Births               No LMP recorded. Patient has had a hysterectomy.  Age at menarche: 22 Age at menopause: 20 Hx of HRT: Yes Hx of STDs: Denies Last pap: 2014 History of abnormal pap smears: No  SCREENING STUDIES:  Last mammogram: 2018  Last colonoscopy: 2018  MEDICATIONS: Outpatient Encounter Medications as of 01/17/2020  Medication Sig  . acetaminophen (TYLENOL) 650 MG CR tablet Take 650 mg by mouth daily as needed for pain.  Marland Kitchen ALPRAZolam (XANAX) 0.25 MG tablet Take  0.25-0.5 mg by mouth at bedtime.   . cholecalciferol (VITAMIN D3) 25 MCG (1000 UNIT) tablet Take 1,000 Units by mouth daily.  Marland Kitchen ELIQUIS 5 MG TABS tablet TAKE (1) TABLET BY MOUTH TWICE DAILY. (Patient taking differently: Take 5 mg by mouth 2 (two) times daily. )  . estrogens, conjugated, (PREMARIN) 0.625 MG tablet Take 1 tablet (0.625 mg total) by mouth every Monday.  Marland Kitchen HYDROcodone-acetaminophen (NORCO/VICODIN) 5-325 MG tablet Take 0.5-1 tablets by mouth at bedtime.   Marland Kitchen levothyroxine (SYNTHROID, LEVOTHROID) 75 MCG tablet Take 75 mcg by mouth every morning.   . metoprolol tartrate (LOPRESSOR) 25 MG tablet TAKE (1/2) TABLET BY MOUTH TWICE DAILY. (Patient taking differently: Take 12.5 mg by mouth 2 (two) times daily. )  . Multiple Vitamin (MULTIVITAMIN WITH MINERALS) TABS tablet Take 1 tablet by mouth daily.  . Omega-3 Fatty Acids (FISH OIL) 1200 MG CAPS Take 1,200 mg by mouth daily.  Marland Kitchen rOPINIRole (REQUIP) 0.25 MG tablet Take 0.75 mg by mouth at bedtime.   . [DISCONTINUED] ZYLET 0.5-0.3 % SUSP Place 1 drop into the left eye 4 (four) times daily. (Patient not taking: Reported on 01/17/2020)   Facility-Administered Encounter Medications as of 01/17/2020  Medication  . 0.9 %  sodium chloride infusion  . 0.9 %  sodium chloride infusion    ALLERGIES:  No Known Allergies   FAMILY HISTORY:  Family History  Problem Relation Age of Onset  . Heart disease Mother   . Heart disease Sister   . Diabetes Sister   . Cancer Sister        melanoma-skin cancer  . Breast cancer Paternal Grandmother        Age 75's  . Cancer Paternal Grandmother        breast  . Cancer Paternal Grandfather        prostate/bladder     SOCIAL HISTORY:    Social Connections:   . Frequency of Communication with Friends and Family: Not on file  . Frequency of Social Gatherings with Friends and Family: Not on file  . Attends Religious Services: Not on file  . Active Member of Clubs or Organizations: Not on file  . Attends  Archivist Meetings: Not on file  . Marital Status: Not on file    REVIEW OF SYSTEMS:  Denies appetite changes, fevers, chills, fatigue, unexplained weight changes. Denies hearing loss, neck lumps or masses, mouth sores, ringing in ears or voice changes. Denies cough or wheezing.  Denies shortness of breath. Denies chest pain or palpitations. Denies leg swelling. Denies abdominal distention, pain, blood in stools, constipation, diarrhea, nausea, vomiting, or early satiety. Denies pain with intercourse, dysuria, frequency, hematuria or incontinence. Denies hot flashes, pelvic pain, vaginal bleeding or vaginal discharge.  Denies joint pain, back pain or muscle pain/cramps. Denies itching, rash, or wounds. Denies dizziness, headaches, numbness or seizures. Denies swollen lymph nodes or glands, denies easy bruising or bleeding. Denies anxiety, depression, confusion, or decreased concentration.  Physical Exam:  Vital Signs for this encounter:  Blood pressure (!) 136/58, pulse 69, temperature (!) 97 F (36.1 C), temperature source Tympanic, resp. rate 18, height 5' 6.5" (1.689 m), weight 144 lb 11.2 oz (65.6 kg), SpO2 100 %. Body mass index is 23.01 kg/m. General: Alert, oriented, no acute distress.  HEENT: Normocephalic, atraumatic. Sclera anicteric.  Chest: Clear to auscultation bilaterally. No wheezes, rhonchi, or rales. Cardiovascular: Regular rate and rhythm, no murmurs, rubs, or gallops.  Abdomen: Obese. Normoactive bowel sounds. Soft, nondistended, nontender to palpation. No masses or hepatosplenomegaly appreciated. No palpable fluid wave.  Low horizontal incision, well-healed.  There is a palpable and fairly nonmobile mass above the mons in the midline into the right that on palpation is approximately 3 x 4 cm.  This area is mildly tender with deep palpation. Extremities: Grossly normal range of motion. Warm, well perfused. No edema bilaterally.  Skin: No rashes or lesions.   Lymphatics: No cervical, supraclavicular, or inguinal adenopathy.  GU:  Normal external female genitalia.   No discharge or bleeding.  Some loss of architecture.             On gentle bimanual exam, no masses or nodularity appreciated.  Speculum exam and rectal exam deferred at today's visit.  LABORATORY AND RADIOLOGIC DATA:  Outside medical records were reviewed to synthesize the above history, along with the history and physical obtained during the visit.   Lab Results  Component Value Date   WBC 4.5 01/06/2020   HGB 12.1 01/06/2020   HCT 36.6 01/06/2020   PLT 214 01/06/2020   GLUCOSE 109 (H) 12/25/2019   ALT 25 12/25/2019   AST 27 12/25/2019   NA 138 12/25/2019   K 4.5 12/25/2019   CL 106 12/25/2019   CREATININE 0.82 12/25/2019   BUN 13 12/25/2019   CO2 25 12/25/2019   TSH 1.350 01/13/2015   INR 1.0 01/06/2020   CT A/P on 12/23/19: Within the ventral, midline abdominal wall soft tissue mass is identified anterior to the pubic symphysis measuring 5.2 x 3.3 by 3.4 cm (volume = 31 cm^3), image 59/5 and image 78/2. This is compared with 4.9 x 3.0 by 2.6 (volume = 20)cm IMPRESSION: 1. Stable to slight increase in size of ventral, midline abdominal wall soft tissue mass at the level of the pubic symphysis. 2. No signs of solid organ or nodal metastases. 3. Aortic atherosclerosis.  CT biopsy of the mass on 8/23: A. SOFT TISSUE MASS, PELVIC BODY WALL, NEEDLE CORE BIOPSY:  - Metastatic adenocarcinoma, see comment.  COMMENT:  The tumor has malignant cells with cleared cytoplasm.  Immunohistochemistry is positive for cytokeratin 7, PAX8, racemase, and  NapsinA. The cells are negative for ER, WT-1, and CD10. 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 Dr. Benay Spice was  notified on 01/08/20.

## 2020-01-17 ENCOUNTER — Encounter: Payer: Self-pay | Admitting: Gynecologic Oncology

## 2020-01-17 ENCOUNTER — Other Ambulatory Visit: Payer: Self-pay

## 2020-01-17 ENCOUNTER — Inpatient Hospital Stay: Payer: Medicare Other | Attending: Gynecologic Oncology | Admitting: Gynecologic Oncology

## 2020-01-17 ENCOUNTER — Inpatient Hospital Stay: Payer: Medicare Other

## 2020-01-17 VITALS — BP 136/58 | HR 69 | Temp 97.0°F | Resp 18 | Ht 66.5 in | Wt 144.7 lb

## 2020-01-17 DIAGNOSIS — I4891 Unspecified atrial fibrillation: Secondary | ICD-10-CM

## 2020-01-17 DIAGNOSIS — G2581 Restless legs syndrome: Secondary | ICD-10-CM

## 2020-01-17 DIAGNOSIS — Z9079 Acquired absence of other genital organ(s): Secondary | ICD-10-CM | POA: Insufficient documentation

## 2020-01-17 DIAGNOSIS — Z9221 Personal history of antineoplastic chemotherapy: Secondary | ICD-10-CM

## 2020-01-17 DIAGNOSIS — I10 Essential (primary) hypertension: Secondary | ICD-10-CM | POA: Insufficient documentation

## 2020-01-17 DIAGNOSIS — Z87440 Personal history of urinary (tract) infections: Secondary | ICD-10-CM | POA: Diagnosis not present

## 2020-01-17 DIAGNOSIS — C569 Malignant neoplasm of unspecified ovary: Secondary | ICD-10-CM | POA: Insufficient documentation

## 2020-01-17 DIAGNOSIS — Z79899 Other long term (current) drug therapy: Secondary | ICD-10-CM | POA: Insufficient documentation

## 2020-01-17 DIAGNOSIS — Z923 Personal history of irradiation: Secondary | ICD-10-CM | POA: Insufficient documentation

## 2020-01-17 DIAGNOSIS — C7989 Secondary malignant neoplasm of other specified sites: Secondary | ICD-10-CM | POA: Diagnosis not present

## 2020-01-17 DIAGNOSIS — Z90722 Acquired absence of ovaries, bilateral: Secondary | ICD-10-CM | POA: Diagnosis not present

## 2020-01-17 DIAGNOSIS — Z7901 Long term (current) use of anticoagulants: Secondary | ICD-10-CM | POA: Diagnosis not present

## 2020-01-17 DIAGNOSIS — Z90710 Acquired absence of both cervix and uterus: Secondary | ICD-10-CM | POA: Diagnosis not present

## 2020-01-17 DIAGNOSIS — E7800 Pure hypercholesterolemia, unspecified: Secondary | ICD-10-CM | POA: Diagnosis not present

## 2020-01-17 DIAGNOSIS — C801 Malignant (primary) neoplasm, unspecified: Secondary | ICD-10-CM

## 2020-01-17 NOTE — Patient Instructions (Signed)
It was a pleasure seeing you today.  I will ask the team at [facility] to call you to set up your presurgery appointment and Covid testing.  Our tentative plan for surgery is on Thursday, 9/23.  I will see you in [facility] for follow-up afterwards.

## 2020-01-21 ENCOUNTER — Telehealth: Payer: Self-pay | Admitting: *Deleted

## 2020-01-21 DIAGNOSIS — H52222 Regular astigmatism, left eye: Secondary | ICD-10-CM | POA: Diagnosis not present

## 2020-01-21 DIAGNOSIS — H25099 Other age-related incipient cataract, unspecified eye: Secondary | ICD-10-CM | POA: Diagnosis not present

## 2020-01-21 DIAGNOSIS — H5213 Myopia, bilateral: Secondary | ICD-10-CM | POA: Diagnosis not present

## 2020-01-21 DIAGNOSIS — H524 Presbyopia: Secondary | ICD-10-CM | POA: Diagnosis not present

## 2020-01-21 NOTE — Telephone Encounter (Signed)
Faxed records to [facility] for the patient's surgery on 9/23

## 2020-01-24 DIAGNOSIS — E063 Autoimmune thyroiditis: Secondary | ICD-10-CM | POA: Diagnosis not present

## 2020-01-24 DIAGNOSIS — C801 Malignant (primary) neoplasm, unspecified: Secondary | ICD-10-CM | POA: Diagnosis not present

## 2020-01-24 DIAGNOSIS — G894 Chronic pain syndrome: Secondary | ICD-10-CM | POA: Diagnosis not present

## 2020-01-24 DIAGNOSIS — Z6822 Body mass index (BMI) 22.0-22.9, adult: Secondary | ICD-10-CM | POA: Diagnosis not present

## 2020-01-30 ENCOUNTER — Inpatient Hospital Stay: Payer: Medicare Other

## 2020-01-30 ENCOUNTER — Inpatient Hospital Stay (HOSPITAL_BASED_OUTPATIENT_CLINIC_OR_DEPARTMENT_OTHER): Payer: Medicare Other | Admitting: Oncology

## 2020-01-30 ENCOUNTER — Telehealth: Payer: Self-pay | Admitting: Oncology

## 2020-01-30 ENCOUNTER — Other Ambulatory Visit: Payer: Self-pay

## 2020-01-30 VITALS — BP 121/59 | HR 59 | Temp 98.0°F | Resp 17 | Ht 66.5 in | Wt 145.0 lb

## 2020-01-30 DIAGNOSIS — I10 Essential (primary) hypertension: Secondary | ICD-10-CM | POA: Diagnosis not present

## 2020-01-30 DIAGNOSIS — C7989 Secondary malignant neoplasm of other specified sites: Secondary | ICD-10-CM | POA: Diagnosis not present

## 2020-01-30 DIAGNOSIS — Z9221 Personal history of antineoplastic chemotherapy: Secondary | ICD-10-CM | POA: Diagnosis not present

## 2020-01-30 DIAGNOSIS — Z923 Personal history of irradiation: Secondary | ICD-10-CM | POA: Diagnosis not present

## 2020-01-30 DIAGNOSIS — E7800 Pure hypercholesterolemia, unspecified: Secondary | ICD-10-CM | POA: Diagnosis not present

## 2020-01-30 DIAGNOSIS — C569 Malignant neoplasm of unspecified ovary: Secondary | ICD-10-CM

## 2020-01-30 NOTE — Progress Notes (Signed)
[facility] OFFICE PROGRESS NOTE   Diagnosis: Ovarian cancer  INTERVAL HISTORY:   [name] returns as scheduled.  She reports mild discomfort in the lower abdomen/pelvis since undergoing the recent pelvic biopsy.  She saw Dr. Berline Lopes and is scheduled for resection of the abdominal wall mass next week.  Objective:  Vital signs in last 24 hours:  Blood pressure (!) 121/59, pulse (!) 59, temperature 98 F (36.7 C), temperature source Tympanic, resp. rate 17, height 5' 6.5" (1.689 m), weight 145 lb (65.8 kg), SpO2 100 %.    Lymphatics: No cervical, supraclavicular, axillary, or inguinal nodes Resp: Lungs clear bilaterally Cardio: Regular rate and rhythm GI: No hepatosplenomegaly, no mass, nontender, no palpable tumor at the low transverse scar, reducible soft fullness in the suprapubic area, hernia? Vascular: No leg edema  Lab Results:  Lab Results  Component Value Date   WBC 4.5 01/06/2020   HGB 12.1 01/06/2020   HCT 36.6 01/06/2020   MCV 99.2 01/06/2020   PLT 214 01/06/2020   NEUTROABS 2.0 10/19/2018    CMP  Lab Results  Component Value Date   NA 138 12/25/2019   K 4.5 12/25/2019   CL 106 12/25/2019   CO2 25 12/25/2019   GLUCOSE 109 (H) 12/25/2019   BUN 13 12/25/2019   CREATININE 0.82 12/25/2019   CALCIUM 10.6 (H) 12/25/2019   PROT 7.0 12/25/2019   ALBUMIN 3.7 12/25/2019   AST 27 12/25/2019   ALT 25 12/25/2019   ALKPHOS 51 12/25/2019   BILITOT 0.3 12/25/2019   GFRNONAA >60 12/25/2019   GFRAA >60 12/25/2019     Medications: I have reviewed the patient's current medications.   Assessment/Plan: 1.  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   Elevated CA 125.   Cycle 1 Taxol/carboplatin 08/16/2013.   Cycle 2 Taxol/carboplatin 09/06/2013.   Cycle 3 Taxol/carboplatin 09/27/2013   CT  10/11/2013 with a stable abdominal wall mass.  Status post resection of abdominal wall mass with abdominal wall reconstruction 11/05/2013. Final pathology showed adenocarcinoma. Specimen extensively involved with adenocarcinoma which focally involved the edge of the specimen. Immunostains and Foundation 1 testing were nonspecific for a primary tumor location.  Adjuvant radiation to the abdominal wall 01/16/2014 through 02/27/2014  CT 01/15/2018 while in the emergency room for evaluation of back pain-midline abdominal wall mass above the pubic symphysis and adjacent to the bladder  Ultrasound-guided biopsy of the abdominal wall mass 01/25/2018-adenocarcinoma similar to the 2015 biopsy  PET scan 03/01/2018-hypermetabolism associated with a cystic and solid suprapubic mass. Focal uptake identified in the left L3-4 facets without underlying bony lesion. No other sites of unexpected or suspicious hypermetabolism in the neck, chest, abdomen or pelvis.  Exploratory laparotomy, resection of abdominal wall tumor, oversew of bladder peritoneum, cystoscopy 03/28/2018(findings included a cystic and solid mass encountered below the lower edge of the incision extending into the space ofretziusand adherent to the pubic arch inferiorly, bladder dome posteriorly and prior abdominal wall repair anteriorly. No nodularity palpated in the tumor bed. Visualized bowel and omentum free of disease. Pelvic peritoneum appeared normal). A "small "amount of tumor on the periosteum was cauterized using a Bovie.  Pathology on pelvic tumor-metastatic high-grade adenocarcinoma, favor clear-cell carcinoma of gynecologic origin, fragments up to 5.8 cm in size; ER negative, PR positive, 1+ (weak staining) to focally 2+, 30-40%, MSI-stable, mutation burden-4,CREBBPalteration  Cycle 1  gemcitabine/carboplatin 05/21/2018; day 8 held due to neutropenia  Cycle2 gemcitabine/carboplatin 06/11/2018 (day 8 gemcitabine  eliminated)  Cycle 3 gemcitabine/carboplatin 07/06/2018  Cycle 4 gemcitabine/carboplatin 07/27/2018 (Udenyca added)  Cycle 5 gemcitabine/carboplatin 08/17/2018  Cycle 6 gemcitabine/carboplatin 09/07/2018   CT 10/19/2018- low anterior abdominal wall mass no longer identified, soft tissue thickening cephalad to the site of prior mass-nonspecific, no additional evidence of metastatic disease  CT 04/18/2019-no evidence of recurrent or metastatic disease  CT 12/23/2019-slight increase in size of ventral midline soft tissue mass near the pubis, no other evidence of disease progression  Biopsy pelvic body wall soft tissue mass 01/06/2020-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 2. Ovarian cancer in 1988, low-grade adenocarcinoma with areas of "borderline" carcinoma, status post a hysterectomy and bilateral oophorectomy. 3. New onset atrial fibrillation August 2016, maintained on eliquis 4. Recurrent urinary tract infections 5. Restless legs    Disposition: Ms. Opie appears stable.  We discussed treatment options.  We discussed observation versus resection of the mass.  She prefers to proceed with resection.  Ms. Esteve is scheduled for surgery 02/06/2020 at [facility].  She will hold Eliquis prior to the procedure.  She will return for an office visit here on 03/03/2020.  [name], MD  01/30/2020  9:38 AM

## 2020-01-30 NOTE — Telephone Encounter (Signed)
Scheduled appointments per 9/16 los. Patient is aware of upcoming appointment and asked specifically to see Dr. Benay Spice that day. Gave patient calendar print out.

## 2020-02-03 ENCOUNTER — Telehealth: Payer: Self-pay | Admitting: *Deleted

## 2020-02-03 DIAGNOSIS — G2581 Restless legs syndrome: Secondary | ICD-10-CM | POA: Diagnosis not present

## 2020-02-03 DIAGNOSIS — C801 Malignant (primary) neoplasm, unspecified: Secondary | ICD-10-CM | POA: Diagnosis not present

## 2020-02-03 DIAGNOSIS — Z01812 Encounter for preprocedural laboratory examination: Secondary | ICD-10-CM | POA: Diagnosis not present

## 2020-02-03 DIAGNOSIS — I480 Paroxysmal atrial fibrillation: Secondary | ICD-10-CM | POA: Diagnosis not present

## 2020-02-03 DIAGNOSIS — Z20822 Contact with and (suspected) exposure to covid-19: Secondary | ICD-10-CM | POA: Diagnosis not present

## 2020-02-03 DIAGNOSIS — E039 Hypothyroidism, unspecified: Secondary | ICD-10-CM | POA: Insufficient documentation

## 2020-02-03 NOTE — Telephone Encounter (Addendum)
   Primary Cardiologist: Carlyle Dolly, MD  Chart reviewed as part of pre-operative protocol coverage. Patient was contacted 02/03/2020 in reference to pre-operative risk assessment for pending surgery as outlined below.  [name] was last seen on 03/22/19 by Dr. Harl Bowie with history of atrial fib, metastatic ovarian cancer, GERD, anxiety, HLD, thyroid disease, RLS. 2D Echo 2016 with normal LVEF and mild MR. RCRI 0.4% indicating low risk of CV complications but just need to discuss with patient to ensure she has been symptomatically stable.  Our pharmacists are gone for the day so I reached out to DOD Dr. Harrington Challenger for review on holding anticoagulation given that procedure is only a few days away. The patient has no history of stroke/TIA and has normal kidney function. Dr. Harrington Challenger recommends that she is OK to take tonight's dose of Eliquis then hold thereafter in anticipation of surgery 9/23. I tried to call her but got VM so left message on primary number. I asked her to please call us back even after-hours line so we can relay these acute instructions to her. We also need to review if she has had any recent cardiac symptoms so that we can finalize formal clearance. The mobile number in her chart does not work. Await callback from patient.  Charlie Pitter, PA-C 02/03/2020, 5:16 PM

## 2020-02-03 NOTE — Telephone Encounter (Signed)
   [facility] Pre-operative Risk Assessment    HEARTCARE STAFF: - Please ensure there is not already an duplicate clearance open for this procedure. - Under Visit Info/Reason for Call, type in Other and utilize the format Clearance MM/DD/YY or Clearance TBD. Do not use dashes or single digits. - If request is for dental extraction, please clarify the # of teeth to be extracted.  Request for surgical clearance:  1. What type of surgery is being performed? Abdominal wall resection  2. When is this surgery scheduled? 02/06/2020   3. What type of clearance is required (medical clearance vs. Pharmacy clearance to hold med vs. Both)? Both  4. Are there any medications that need to be held prior to surgery and how long?Eliquis   5. Practice name and name of physician performing surgery? [facility]  6. What is the office phone number? [phone]   7.   What is the office fax number? [phone]  8.   Anesthesia type (None, local, MAC, general) ? General   [name] L 02/03/2020, 4:50 PM  _________________________________________________________________   (provider comments below)

## 2020-02-04 NOTE — Telephone Encounter (Signed)
Patient with diagnosis of A Fib on Eliquis for anticoagulation.    Procedure: abdominal wall resection Date of procedure: 02/06/20   CHADS2-VASc score of  2 (AGE, female)  CrCl 63 mL/min Platelet count 214K  Per office protocol, patient can hold Eliquis for 2 days prior to procedure.    Patient will not need bridging with Lovenox (enoxaparin) around procedure.  If not bridging, patient should restart Eliquis on the evening of procedure or day after, at discretion of procedure MD

## 2020-02-04 NOTE — Telephone Encounter (Signed)
   Primary Cardiologist: Carlyle Dolly, MD  Chart reviewed and patient contacted by phone today as part of pre-operative protocol coverage. Given past medical history and time since last visit, based on ACC/AHA guidelines, [name] would be at acceptable risk for the planned procedure without further cardiovascular testing.   The patient was advised that if she develops new symptoms prior to surgery to contact our office to arrange for a follow-up visit, and she verbalized understanding.  OK to hold Eliquis 2 days pre op and resume as soon as safe post op.  I have informed the patient of these instructions.   I will route this recommendation to the requesting party via Epic fax function and remove from pre-op pool.  Please call with questions.  Kerin Ransom, PA-C 02/04/2020, 8:19 AM

## 2020-02-06 DIAGNOSIS — C7982 Secondary malignant neoplasm of genital organs: Secondary | ICD-10-CM | POA: Diagnosis present

## 2020-02-06 DIAGNOSIS — C801 Malignant (primary) neoplasm, unspecified: Secondary | ICD-10-CM | POA: Diagnosis not present

## 2020-02-06 DIAGNOSIS — C569 Malignant neoplasm of unspecified ovary: Secondary | ICD-10-CM | POA: Diagnosis not present

## 2020-02-06 DIAGNOSIS — I10 Essential (primary) hypertension: Secondary | ICD-10-CM | POA: Diagnosis present

## 2020-02-06 DIAGNOSIS — Z20822 Contact with and (suspected) exposure to covid-19: Secondary | ICD-10-CM | POA: Diagnosis present

## 2020-02-06 DIAGNOSIS — E039 Hypothyroidism, unspecified: Secondary | ICD-10-CM | POA: Diagnosis present

## 2020-02-06 DIAGNOSIS — Z7901 Long term (current) use of anticoagulants: Secondary | ICD-10-CM | POA: Diagnosis not present

## 2020-02-06 DIAGNOSIS — C495 Malignant neoplasm of connective and soft tissue of pelvis: Secondary | ICD-10-CM | POA: Diagnosis not present

## 2020-02-06 DIAGNOSIS — F419 Anxiety disorder, unspecified: Secondary | ICD-10-CM | POA: Diagnosis present

## 2020-02-06 DIAGNOSIS — I480 Paroxysmal atrial fibrillation: Secondary | ICD-10-CM | POA: Diagnosis present

## 2020-02-06 DIAGNOSIS — E785 Hyperlipidemia, unspecified: Secondary | ICD-10-CM | POA: Diagnosis present

## 2020-02-06 DIAGNOSIS — C579 Malignant neoplasm of female genital organ, unspecified: Secondary | ICD-10-CM | POA: Diagnosis not present

## 2020-02-06 DIAGNOSIS — F0390 Unspecified dementia without behavioral disturbance: Secondary | ICD-10-CM | POA: Diagnosis present

## 2020-02-06 DIAGNOSIS — Z9221 Personal history of antineoplastic chemotherapy: Secondary | ICD-10-CM | POA: Diagnosis not present

## 2020-02-06 DIAGNOSIS — Z9049 Acquired absence of other specified parts of digestive tract: Secondary | ICD-10-CM | POA: Diagnosis not present

## 2020-02-10 ENCOUNTER — Other Ambulatory Visit: Payer: Self-pay | Admitting: *Deleted

## 2020-02-10 MED ORDER — APIXABAN 5 MG PO TABS
ORAL_TABLET | ORAL | 0 refills | Status: DC
Start: 2020-02-10 — End: 2020-03-03

## 2020-02-10 NOTE — Telephone Encounter (Signed)
Brought [employer]-PAF application back to office and requested eliquis samples. Samples provided and awaiting pick up.

## 2020-02-13 DIAGNOSIS — I4891 Unspecified atrial fibrillation: Secondary | ICD-10-CM | POA: Diagnosis not present

## 2020-02-13 DIAGNOSIS — E7849 Other hyperlipidemia: Secondary | ICD-10-CM | POA: Diagnosis not present

## 2020-02-19 ENCOUNTER — Telehealth: Payer: Self-pay | Admitting: Cardiology

## 2020-02-19 NOTE — Telephone Encounter (Signed)
Called to check on status of assistance she dropped off last week.   Also wanting to know if she can get some samples.

## 2020-02-19 NOTE — Telephone Encounter (Signed)
Informed that app was faxed today and samples are available for pick up.

## 2020-02-25 ENCOUNTER — Telehealth: Payer: Self-pay | Admitting: *Deleted

## 2020-02-25 NOTE — Telephone Encounter (Signed)
Called the patient and moved her appt from 10/22 to 10/14

## 2020-02-27 ENCOUNTER — Inpatient Hospital Stay: Payer: Medicare Other

## 2020-02-27 ENCOUNTER — Encounter: Payer: Self-pay | Admitting: Gynecologic Oncology

## 2020-02-27 ENCOUNTER — Inpatient Hospital Stay: Payer: Medicare Other | Attending: Gynecologic Oncology | Admitting: Gynecologic Oncology

## 2020-02-27 ENCOUNTER — Other Ambulatory Visit: Payer: Self-pay

## 2020-02-27 VITALS — BP 136/61 | HR 56 | Temp 97.0°F | Resp 18 | Wt 146.2 lb

## 2020-02-27 DIAGNOSIS — C569 Malignant neoplasm of unspecified ovary: Secondary | ICD-10-CM

## 2020-02-27 DIAGNOSIS — C7989 Secondary malignant neoplasm of other specified sites: Secondary | ICD-10-CM | POA: Insufficient documentation

## 2020-02-27 DIAGNOSIS — I4891 Unspecified atrial fibrillation: Secondary | ICD-10-CM | POA: Insufficient documentation

## 2020-02-27 DIAGNOSIS — Z803 Family history of malignant neoplasm of breast: Secondary | ICD-10-CM | POA: Diagnosis not present

## 2020-02-27 DIAGNOSIS — C801 Malignant (primary) neoplasm, unspecified: Secondary | ICD-10-CM

## 2020-02-27 NOTE — Patient Instructions (Signed)
You are healing well from surgery! I think the swelling underneath your incision is healing tissue or may have been related to some bleeding after surgery. That swelling should improve with time. I would like to see you back in a month to take another look. I have reached out to Dr. Sondra Come to see if there would be any room to give you more radiation since the tumor was significantly lower along your abdominal wall than when you first had radiation.

## 2020-02-27 NOTE — Progress Notes (Signed)
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 Pathology report says "focal well differentiated serous cystadenocarcinoma". The comment is that the adenocarcinomatous component is limited to rare foci of small invasive glandular structures. Another report states that there is a small focus of papillary adenocarcinoma in a serous cystadenofibroma of the left ovary. Uterus, cervix, left fallopian tube, right adnexa and pelvic washings all without evidence of malignancy. Appendix also removed at the time of surgery.  Her CA-125 at that time was reported to be 33. She did not receive adjuvant therapy for her initial cancer diagnosis.  She presented in 07/2013 with abdominal pain and mass was noted along right inferior rectus muscle. Biopsy of the mass showed adenocarcinoma of unknown primary. She received 3 cycles of neoadjuvant chemotherapy (no response), carboplatin/taxol. After resection, she underwent RT due to positie tumor margin. This was completed in 02/2014.  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 Lower abdominal/suprapubic ventral wall lobulated soft tissue  mass, arising within the lower rectus musculature more so on the  right. 5.5 x 7.9 x 9.7 cm. Favor sarcoma or other connective tissue  tumor. This should be amenable to percutaneous biopsy. In a younger  female patient, endometriosis within a Cesarean section scar would  also be a consideration for this appearance.  2.  Narrow fat plane between the mass in #1 and underlying small  bowel and bladder. No lymphadenopathy. No ascites.  3. Surgically absent gallbladder, uterus, and adnexa. Diverticulosis  of the colon.  Study discussed by telephone with PA BENJAMIN MANN on 07/19/2013 at  11:35 .    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 Microscopic Comment Needle core biopsies demonstrate diffuse involvement by well differentiated adenocarcinoma. The adenocarcinoma has the following immunophenotype: Cytokeratin 7 - strong diffuse expression Cytokeratin 20 - negative expression CDX2 - negative expression TTF-1 - negative expression Estrogen receptor - negative expression Vimentin - patchy mild to moderate expression   08/16/2013 - 09/27/2013 Chemotherapy   3 cycle of carboplatin, paclitaxel    10/11/2013 Imaging   CT A/P: A heterogeneous mass centered in the inferior right rectus abdominus  muscle measures 5.3 x 7.5 cm (previously 5.5 x 7.9 cm). No  pathologically enlarged lymph nodes. Scattered atherosclerotic  calcification of the arterial vasculature without abdominal aortic  aneurysm. No free fluid. No worrisome lytic or sclerotic lesions.  Degenerative changes are seen in the spine.   IMPRESSION:  Right rectus abdominus mass is stable to very minimally smaller than  on 07/19/2013.   11/05/2013 Surgery   Resection abdominal wall mass, abdominal wall reconstruction with Strattice matrix (10x11cm)  Operative findings: 8 involving the subcutaneous tissues, fascia, and rectus on the right side. No obvious intraperitoneal disease. Normal-appearing omentum   11/05/2013 Pathology Results   Soft tissue mass, simple excision, abdominal wall mass ADENOCARCINOMA. Microscopic Comment The specimen is extensively involved by  adenocarcinoma which focally involves the edge  of the specimen. Immunohistochemistry is performed and the tumor shows patchy positivity with Napsin-A and is negative with WT-1, estrogen receptor, progesterone receptor, gross disease cystic fluid protein, CDX-2, carcinoembryonic antigen, thyroid transcription factor-1, CD10 and CD117. The immunophenotype is nonspecific and additional immunohistochemistry will be performed and reported as an addendum. (JDP:kh 11/07/13) ADDENDUM: Additional immunohistochemistry is performed and the tumor is positive with cytokeratin AE1/AE3 and cytokeratin 7 and is negative with Calretinin, cytokeratin 5/6 and cytokeratin 20. The immunoreactivity is not specific for location of a primary tumor. (JDP:kh 11-08-13)   11/05/2013 Genetic Testing   [facility] testing MYC amplification - equivocal CREBBP [id number]* No FDA approved therapies in patient's tumor type or another tumor type    01/16/2014 - 02/27/2014 Radiation Therapy   45 gray to the target region within the abdominal wall postoperative area. She received 45 gray using a 4 field 3-D conformal technique at 1.8 gray per fraction. The patient then received a boost using a cone down 4 field technique for an additional 9 Gray. The final dose was 54 gray.   01/29/2018 Pathology Results   Soft Tissue Needle Core Biopsy, ant abd wall mass - ADENOCARCINOMA. - SEE MICROSCOPIC DESCRIPTION. Microscopic Comment The core biopsies consist of abundant dense, hyalinized fibrous tissue with scattered glands with cytologic atypia consistent with adenocarcinoma. Immunohistochemistry shows the tumor is positive with cytokeratin AE1/AE3 and cytokeratin 7 and is negative with cytokeratin 5/6, Calretinin, WT-1, estrogen receptor, progesterone receptor and cytokeratin 20. The morphology and immunophenotype are similar to the morphology in the abdominal wall mass excision from 11/07/2013 [phone]).   03/01/2018 Imaging   PET: 1.  Hypermetabolism associated with the cystic and solid suprapubic mass compatible with the reported clinical history of adenocarcinoma recurrence. Cystic components in the cranial aspect of the lesion are largely devoid of FDG accumulation with the most hypermetabolic tissue being inferiorly, immediately adjacent to the symphysis pubis. 2. Focal uptake identified in the left L3-4 facets without underlying bony lesion. This is presumed to represent uptake related to degenerative change. 3. No other sites of unexpected or suspicious hypermetabolism in the neck, chest, abdomen, or pelvis   03/02/2018 Imaging   CT A/P: 6.7 cm mass in the lower anterior abdominal wall containing cystic and solid components, suspicious for recurrence of previously resected/treated adenocarcinoma in this region. This abuts and could invade the anterior wall of the bladder.   No evidence for more distant metastatic disease within the abdomen or pelvis.    03/28/2018 Surgery   Exploratory laparotomy, resection abdominal wall tumor, oversew of bladder peritoneum, cystoscopy.  Findings: normal external genitalia, unable to palpate mass on exam. After opening the fascia, a cystic and solid mass was encountered below the lower edge of the incision, extending into the space of retzius and adherent to the pubic arch inferiorly, the bladder dome posteriorly, and prior abdominal wall repair anteriorly. At conclusion of the case, no nodularity was palpated in the tumor bed. Visualized bowel and omentum were free of disease. Pelvic peritoenum appeared normal. Cystoscopy revealed intact bladder mucosa with no visible suture. Bilateral ureteral orfices visualized.   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 Additional immunohistochemical studies performed on block A5 in the current specimen demonstrates  that the tumor is positive for Napsin A and PAX-8, and is negative for TTF-1. 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   03/28/2018 Genetic Testing   [employer] No reportable alterations with companion diagnostic claims MS-stable, TMB - 4 Muts/Mb, CREBBP [id number]*   05/21/2018 -  Chemotherapy   The patient had palonosetron (ALOXI) injection 0.25 mg, 0.25 mg, Intravenous,  Once, 6 of 6 cycles Administration: 0.25 mg (05/21/2018), 0.25 mg (07/27/2018), 0.25 mg (06/11/2018), 0.25 mg (07/06/2018), 0.25 mg (08/17/2018), 0.25 mg (09/07/2018) pegfilgrastim-cbqv (UDENYCA) injection 6 mg, 6 mg, Subcutaneous, Once, 3 of 3 cycles Administration: 6 mg (07/30/2018), 6 mg (09/08/2018) CARBOplatin (PARAPLATIN) 310 mg in sodium chloride 0.9 % 250 mL chemo infusion, 310 mg (100 % of original dose 306.4 mg), Intravenous,  Once, 6 of 6 cycles Dose modification:   (original dose 306.4 mg, Cycle 1),   (original dose 306.4 mg, Cycle 4) Administration: 310 mg (05/21/2018), 300 mg (07/27/2018), 300 mg (06/11/2018), 300 mg (07/06/2018), 300 mg (08/17/2018), 300 mg (09/07/2018) gemcitabine (GEMZAR) 1,368 mg in sodium chloride 0.9 % 250 mL chemo infusion, 800 mg/m2 = 1,368 mg (100 % of original dose 800 mg/m2), Intravenous,  Once, 6 of 6 cycles Dose modification: 800 mg/m2 (original dose 800 mg/m2, Cycle 1, Reason: Provider Judgment) Administration: 1,368 mg (05/21/2018), 1,368 mg (07/27/2018), 1,368 mg (06/11/2018), 1,368 mg (07/06/2018), 1,368 mg (08/17/2018), 1,368 mg (09/07/2018)  for chemotherapy treatment.     Pathology  Results   A. SOFT TISSUE MASS, PELVIC BODY WALL, NEEDLE CORE BIOPSY:  - Metastatic adenocarcinoma, see comment.   COMMENT:   The tumor has malignant cells with cleared cytoplasm.  Immunohistochemistry is positive for cytokeratin 7, PAX8, racemase, and NapsinA. The cells are negative for ER, WT-1, and CD10. 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 Dr. Benay Spice was notified on 01/08/20.    10/19/2018 Imaging   CT A/P: 1. Previously noted lower anterior abdominal wall mass is no longer identified, presumably surgically resected. Cephalad to the site of the prior mass within the low anterior abdominal wall there is some soft tissue thickening seen on today's examination (axial image 54 of series 2). This is nonspecific and will serve as a baseline for future follow-up examinations. 2. No signs of metastatic disease elsewhere in the abdomen or pelvis. 3. Colonic diverticulosis without evidence of acute diverticulitis at this time. 4. Aortic atherosclerosis. 5. Additional incidental findings, as above.   04/18/2019 Imaging   CT A/P: 1. No evidence of recurrent or metastatic carcinoma within the abdomen or pelvis. 2. Colonic diverticulosis. No radiographic evidence of diverticulitis.   12/23/2019 Imaging   CT A/P: Within the ventral, midline abdominal wall soft tissue mass is identified anterior to the pubic symphysis measuring 5.2 x 3.3 by 3.4 cm (volume = 31 cm^3), image 59/5 and image 78/2. This is compared with 4.9 x 3.0 by 2.6 (volume = 20)cm IMPRESSION: 1. Stable to slight increase in size of ventral, midline abdominal wall soft tissue mass at the level of the pubic symphysis. 2. No signs of solid organ or nodal metastases. 3. Aortic atherosclerosis.   01/06/2020 Relapse/Recurrence   Anterior abdominal wall mass biopsy: A. SOFT TISSUE MASS, PELVIC BODY WALL, NEEDLE CORE BIOPSY:  -  Metastatic  adenocarcinoma, see comment.   COMMENT:   The tumor has malignant cells with cleared cytoplasm.  Immunohistochemistry is positive for cytokeratin 7, PAX8, racemase, and  NapsinA. The cells are negative for ER, WT-1, and CD10. The morphology  and immunophenotype are consistent with metastatic clear cell carcinoma  of gynecologic origin (by definition  high grade). Dr. Benay Spice was  notified on 01/08/20.    02/06/2020 Surgery   Exlap with resection of pelvic tumor densely adherent to the anterior aspect of the pubic arch. Surgery at [facility]  nodular mass palpable, fixed to the pubic arch on exam. On dissection into the mons, an irregularly shaped and nodular tumor was implanted on the pubic arch. No evidence of disease on visualized bowel or omentum. The palpated anterior abdominal wall was free of disease. The bladder dome peritoneum was free of disease. Few soft nodules on the anterior wall peritoneum were ablated using the bovie. No palpable tumor on or surrounding the pubic arch following resection.   02/06/2020 Pathology Results   A: "Pelvic cavity, pubic mons tumor, excision - Clear cell carcinoma, received fragmented, 5.5 cm in aggregate (see comment) - Carcinoma involves multiple cauterized tissue edges  ER negative, PR 1+ (20%)   Adenocarcinoma of abdominal wall, unclear primary  08/09/2013 Initial Diagnosis   Adenocarcinoma of abdominal wall, unclear primary   08/16/2013 - 09/25/2013 Chemotherapy   paclitaxel and carboplatin   11/05/2013 Surgery   resection abdominal wall mass    - 02/27/2014 Radiation Therapy   Completed volume directed radiation   01/06/2020 Relapse/Recurrence   Anterior abdominal wall mass biopsy: A. SOFT TISSUE MASS, PELVIC BODY WALL, NEEDLE CORE BIOPSY:  - Metastatic adenocarcinoma, see comment.   COMMENT:   The tumor has malignant cells with cleared cytoplasm.  Immunohistochemistry is positive for cytokeratin 7, PAX8, racemase, and  NapsinA.  The cells are negative for ER, WT-1, and CD10. The morphology  and immunophenotype are consistent with metastatic clear cell carcinoma  of gynecologic origin (by definition  high grade). Dr. Benay Spice was  notified on 01/08/20.    02/06/2020 Surgery   Exlap with resection of pelvic tumor densely adherent to the anterior aspect of the pubic arch. Surgery at [facility]  nodular mass palpable, fixed to the pubic arch on exam. On dissection into the mons, an irregularly shaped and nodular tumor was implanted on the pubic arch. No evidence of disease on visualized bowel or omentum. The palpated anterior abdominal wall was free of disease. The bladder dome peritoneum was free of disease. Few soft nodules on the anterior wall peritoneum were ablated using the bovie. No palpable tumor on or surrounding the pubic arch following resection.   02/06/2020 Pathology Results   A: "Pelvic cavity, pubic mons tumor, excision - Clear cell carcinoma, received fragmented, 5.5 cm in aggregate (see comment) - Carcinoma involves multiple cauterized tissue edges  ER negative, PR 1+ (20%)   Malignant neoplasm of ovary ([place])  05/03/2018 Initial Diagnosis   Malignant neoplasm of ovary ([place])   05/21/2018 -  Chemotherapy   The patient had palonosetron (ALOXI) injection 0.25 mg, 0.25 mg, Intravenous,  Once, 6 of 6 cycles Administration: 0.25 mg (05/21/2018), 0.25 mg (07/27/2018), 0.25 mg (06/11/2018), 0.25 mg (07/06/2018), 0.25 mg (08/17/2018), 0.25 mg (09/07/2018) pegfilgrastim-cbqv (UDENYCA) injection 6 mg, 6 mg, Subcutaneous, Once, 3 of 3 cycles Administration: 6 mg (07/30/2018), 6 mg (09/08/2018) CARBOplatin (PARAPLATIN) 310 mg in sodium chloride 0.9 % 250 mL chemo infusion, 310 mg (100 % of original dose 306.4 mg), Intravenous,  Once, 6 of 6 cycles Dose modification:   (original dose 306.4 mg, Cycle 1),   (original dose 306.4 mg, Cycle 4) Administration: 310 mg (05/21/2018), 300 mg (07/27/2018), 300 mg (06/11/2018), 300 mg (07/06/2018), 300  mg (08/17/2018), 300 mg (09/07/2018) gemcitabine (GEMZAR) 1,368 mg in sodium chloride 0.9 % 250 mL chemo infusion, 800 mg/m2 = 1,368 mg (100 % of original dose 800 mg/m2), Intravenous,  Once, 6 of 6 cycles Dose modification: 800 mg/m2 (original dose 800 mg/m2, Cycle 1, Reason: Provider Judgment) Administration: 1,368 mg (05/21/2018), 1,368 mg (07/27/2018), 1,368 mg (06/11/2018), 1,368 mg (07/06/2018), 1,368 mg (08/17/2018), 1,368 mg (09/07/2018)  for chemotherapy treatment.      Interval History: Patient notes doing well since surgery.  She denies any significant pelvic pain.  She endorses having a good appetite without nausea or emesis.  She reports regular bowel and bladder function.  Past Medical/Surgical History: Past Medical History:  Diagnosis Date  . Anxiety    new dx  . Atrial fibrillation ([place])   . Cancer ([place]) 1988, 2015   ovarian, adenocarcinoma  . GERD (gastroesophageal reflux disease)    hx of years ago  . Hx of radiation therapy 01/16/14-02/27/14   abdomen   . Hypercholesteremia    under control with diet and fish oil  . Hypertension   . Ovarian cyst   . PONV (postoperative nausea and vomiting)   . Restless leg syndrome   . Thyroid disease     Past Surgical History:  Procedure Laterality Date  . APPENDECTOMY    . APPLICATION OF A-CELL OF CHEST/ABDOMEN N/A 11/05/2013   Procedure: ABDOMINAL WALL RESCONTRUCTION WITH STRATUS;  Surgeon: Irene Limbo, MD;  Location: [facility];  Service: Plastics;  Laterality: N/A;  . CHOLECYSTECTOMY  2009  . LAPAROTOMY N/A 11/05/2013   Procedure: RESECTION OF PELVIC MASS;  Surgeon: Imagene Gurney A. Alycia Rossetti, MD;  Location: [facility];  Service: Gynecology;  Laterality: N/A;  . LAPAROTOMY  03/2018   excision of suprapubic mass at [facility]  . OOPHORECTOMY     BSO  . TONSILLECTOMY  as child  . TOTAL ABDOMINAL HYSTERECTOMY  1988   ovarian cyst  BSO    Family History  Problem Relation Age of Onset  . Heart disease Mother   . Heart disease Sister   . Diabetes  Sister   . Cancer Sister        melanoma-skin cancer  . Breast cancer Paternal Grandmother        Age 90's  . Cancer Paternal Grandmother        breast  . Cancer Paternal Grandfather        prostate/bladder    Social History   Socioeconomic History  . Marital status: Single    Spouse name: Not on file  . Number of children: Not on file  . Years of education: Not on file  . Highest education level: Not on file  Occupational History  . Not on file  Tobacco Use  . Smoking status: Never Smoker  . Smokeless tobacco: Never Used  Vaping Use  . Vaping Use: Never used  Substance and Sexual Activity  . Alcohol use: No    Alcohol/week: 0.0 standard drinks  . Drug use: No  . Sexual activity: Never    Birth control/protection: Surgical    Comment: HYST  Other Topics Concern  . 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 Difficulty of Paying Living Expenses: Not on file  Food Insecurity:   . Worried About Charity fundraiser in the Last Year: Not on file  . Ran Out of Food in the Last Year: Not on file  Transportation Needs:   . Lack of Transportation (Medical): Not on file  . Lack of Transportation (Non-Medical): Not on file  Physical Activity:   . Days of Exercise per Week: Not on file  . Minutes of Exercise per Session: Not on file  Stress:   . Feeling of Stress : Not on file  Social Connections:   . Frequency of Communication with Friends and Family: Not on file  . Frequency of Social Gatherings with Friends and Family: Not on file  . Attends Religious Services: Not on file  . Active Member of Clubs or Organizations: Not on file  . Attends Archivist Meetings: Not on file  . Marital Status: Not on file     Current Medications:  Current Outpatient Medications:  .  ALPRAZolam (XANAX) 0.25 MG tablet, Take 0.25-0.5 mg by mouth at bedtime. , Disp: , Rfl:  .  apixaban (ELIQUIS) 5 MG TABS tablet, TAKE (1) TABLET BY MOUTH TWICE DAILY., Disp: 28 tablet, Rfl: 0 .  cholecalciferol (VITAMIN D3) 25 MCG (1000 UNIT) tablet, Take 1,000 Units by mouth daily., Disp: , Rfl:  .  estrogens, conjugated, (PREMARIN) 0.625 MG tablet, Take 1 tablet (0.625 mg total) by mouth every Monday., Disp: 30 tablet, Rfl: 2 .  HYDROcodone-acetaminophen (NORCO/VICODIN) 5-325 MG tablet, Take 0.5-1 tablets by mouth at bedtime. , Disp: , Rfl:  .  levothyroxine (SYNTHROID, LEVOTHROID) 75 MCG tablet, Take 75 mcg by mouth every morning. , Disp: , Rfl:  .  metoprolol tartrate (LOPRESSOR) 25 MG tablet, TAKE (1/2) TABLET BY MOUTH TWICE DAILY. (Patient taking differently: Take 12.5 mg by mouth 2 (two) times daily. ), Disp: 90 tablet, Rfl: 3 .  Multiple Vitamin (MULTIVITAMIN WITH MINERALS) TABS tablet, Take 1 tablet by mouth daily., Disp: , Rfl:  .  Omega-3 Fatty Acids (FISH OIL) 1200 MG CAPS, Take 1,200 mg by mouth daily., Disp: , Rfl:  .  rOPINIRole (REQUIP) 0.25 MG tablet, Take 0.75 mg by mouth at bedtime. , Disp: , Rfl:  No current facility-administered medications for this visit.  Facility-Administered Medications Ordered in Other Visits:  .  0.9 %  sodium chloride infusion, , Intravenous, Once, [name] B, MD .  0.9 %  sodium chloride infusion, , Intravenous, Once, Ladell Pier, MD  Review of Systems: Denies appetite changes, fevers, chills, fatigue, unexplained weight changes. Denies hearing loss, neck lumps or masses, mouth sores, ringing in ears or voice changes. Denies cough or wheezing.  Denies shortness of breath. Denies chest pain or palpitations. Denies leg swelling. Denies abdominal distention, pain, blood in stools, constipation, diarrhea, nausea, vomiting, or early satiety. Denies pain with intercourse,  dysuria, frequency, hematuria or incontinence. Denies hot flashes, pelvic pain, vaginal bleeding or vaginal discharge.   Denies joint pain, back pain or muscle pain/cramps. Denies itching, rash, or wounds. Denies dizziness, headaches, numbness or seizures. Denies swollen lymph nodes or glands, denies easy bruising or bleeding. Denies anxiety, depression, confusion, or decreased concentration.  Physical Exam: BP 136/61 (BP Location: Left Arm, Patient Position: Sitting)   Pulse (!) 56  Temp (!) 97 F (36.1 C) (Tympanic)   Resp 18   Wt 146 lb 3.2 oz (66.3 kg)   SpO2 100%   BMI 23.24 kg/m  General: Alert, oriented, no acute distress. HEENT: Normocephalic, atraumatic, sclera anicteric. Chest: Unlabored breathing on room air. Abdomen: Obese, soft, nontender.  Normoactive bowel sounds.  No masses or hepatosplenomegaly appreciated.  Well-healing transverse suprapubic incision, remaining Dermabond removed.  There is some fullness for several centimeters around the incision, which I suspect is related to either a seroma or hematoma that developed postoperatively.  This area is not erythematous nor warm or tender to the touch.  There is no drainage from the incision.  There is some minimal fullness of the bilateral labia, right slightly more than left, which I suspect was related to dependent drainage from either the seroma or hematoma. Extremities: Grossly normal range of motion.  Warm, well perfused.  No edema bilaterally. Skin: No rashes or lesions noted.  Laboratory & Radiologic Studies: None new  Assessment & Plan: [name] is a 74 y.o. 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  The patient is doing well from a postoperative standpoint.  We discussed that the fullness around her incision is likely related to the development of seroma or hematoma postoperatively.  I expect this to improve and ultimately resolve  over the upcoming weeks.  I had like to see her back in about 4 weeks to repeat an exam.  We discussed pathology findings and recommendations made at the tumor board discussion at [facility].  I have reached out to Dr. Sondra Come here to see if there would be any additional room for targeted radiation.  The patient's most recent tumor was significantly more inferior than prior tumors and really at the level of the symphysis and inferior.  If there is room for additional radiation, I think the patient would benefit from this to sterilize the area.  This is proven to be an indolent tumor, which is rather surprising given the high-grade histology.  Tumor stains revealed weak progesterone receptor positivity.  Plan is for germline and somatic testing to see if the patient has a targetable mutation.  She is scheduled to see her medical oncologist, Dr. Benay Spice, next week.  I suspect that we may delay making any decision about systemic therapy until we have the results of her testing.  25 minutes of total time was spent for this patient encounter, including preparation, face-to-face counseling with the patient and coordination of care, and documentation of the encounter.  Jeral Pinch, MD  Division of Gynecologic Oncology  Department of Obstetrics and Gynecology  [facility]

## 2020-02-28 LAB — GENETIC SCREENING ORDER

## 2020-03-03 ENCOUNTER — Ambulatory Visit (INDEPENDENT_AMBULATORY_CARE_PROVIDER_SITE_OTHER): Payer: Medicare Other | Admitting: Family Medicine

## 2020-03-03 ENCOUNTER — Other Ambulatory Visit: Payer: Self-pay

## 2020-03-03 ENCOUNTER — Encounter: Payer: Self-pay | Admitting: Family Medicine

## 2020-03-03 ENCOUNTER — Inpatient Hospital Stay: Payer: Medicare Other

## 2020-03-03 ENCOUNTER — Inpatient Hospital Stay (HOSPITAL_BASED_OUTPATIENT_CLINIC_OR_DEPARTMENT_OTHER): Payer: Medicare Other | Admitting: Oncology

## 2020-03-03 VITALS — BP 116/55 | HR 60 | Temp 97.8°F | Resp 18 | Ht 66.5 in | Wt 143.5 lb

## 2020-03-03 VITALS — BP 100/64 | HR 64 | Ht 66.5 in | Wt 145.4 lb

## 2020-03-03 DIAGNOSIS — C7989 Secondary malignant neoplasm of other specified sites: Secondary | ICD-10-CM | POA: Diagnosis not present

## 2020-03-03 DIAGNOSIS — C569 Malignant neoplasm of unspecified ovary: Secondary | ICD-10-CM | POA: Diagnosis present

## 2020-03-03 DIAGNOSIS — I480 Paroxysmal atrial fibrillation: Secondary | ICD-10-CM | POA: Diagnosis not present

## 2020-03-03 DIAGNOSIS — I4891 Unspecified atrial fibrillation: Secondary | ICD-10-CM | POA: Diagnosis not present

## 2020-03-03 MED ORDER — APIXABAN 5 MG PO TABS: ORAL_TABLET | ORAL | 0 refills | Status: DC

## 2020-03-03 NOTE — Progress Notes (Signed)
Cardiology Office Note  Date: 03/03/2020   ID: [name], DOB Apr 18, 1946, MRN [id number]  PCP:  Sharilyn Sites, MD  Cardiologist:  Carlyle Dolly, MD Electrophysiologist:  None   Chief Complaint: Follow-up paroxysmal atrial fibrillation.  History of Present Illness: [name] is a 74 y.o. female with a history of atrial fibrillation, metastatic ovarian adenocarcinoma, HTN, hypothyroidism, GERD, hyperlipidemia.  Last encounter with Dr. Harl Bowie 03/22/2019.  History of atrial fibrillation with a new diagnosis in 2016 during an admission presenting with palpitations and left arm pain.  Converted back to normal sinus rhythm while admitted.  Discharged on low-dose Lopressor.  Echo LVEF 55 to 60%, moderate LAE.  Having rare palpitations and no bleeding on Eliquis.  Metastatic ovarian adenocarcinoma followed by oncology status post surgery 04/04/2018.  Having mild infrequent symptoms with PAF.  Continuing Eliquis.  EKG showed sinus bradycardia at 57.  She is here today for 1 year follow-up.  She recently had some abdominal surgery for recurring abdominal cancer per her statement at [facility].  Notes from [facility] states she was admitted postop for resection of abdominal wall mass for recurrent clear cell cancer gynecological origin on 02/06/2020.  Her primary oncologist is Dr. Berline Lopes.  In 2015 she was diagnosed with recurrent ovarian cancer and underwent resection of the recurrence in her abdominal wall with subsequent carbo/Taxol, chemo September 2019.  Had recurrent abdominal wall mass with subsequent resection and carbo/gem treatment 01/03/2020.  CT showed recurrence of ventral midline soft tissue mass with sliding increase in size.  This was the reason for her most recent surgery.  From a cardiac standpoint  she denies any anginal or exertional symptoms, palpitations or arrhythmias, orthostatic symptoms, CVA or TIA-like symptoms, bleeding, claudication,  DVT or PE-like symptoms, lower extremity edema.    Past Medical History:  Diagnosis Date   Anxiety    new dx   Atrial fibrillation ([place])    Cancer ([place]) 1988, 2015   ovarian, adenocarcinoma   GERD (gastroesophageal reflux disease)    hx of years ago   Hx of radiation therapy 01/16/14-02/27/14   abdomen    Hypercholesteremia    under control with diet and fish oil   Hypertension    Ovarian cyst    PONV (postoperative nausea and vomiting)    Restless leg syndrome    Thyroid disease     Past Surgical History:  Procedure Laterality Date   APPENDECTOMY     APPLICATION OF A-CELL OF CHEST/ABDOMEN N/A 11/05/2013   Procedure: ABDOMINAL WALL RESCONTRUCTION WITH STRATUS;  Surgeon: Irene Limbo, MD;  Location: [facility];  Service: Plastics;  Laterality: N/A;   CHOLECYSTECTOMY  2009   LAPAROTOMY N/A 11/05/2013   Procedure: RESECTION OF PELVIC MASS;  Surgeon: Imagene Gurney A. Alycia Rossetti, MD;  Location: [facility];  Service: Gynecology;  Laterality: N/A;   LAPAROTOMY  03/2018   excision of suprapubic mass at [facility]   OOPHORECTOMY     BSO   TONSILLECTOMY  as child   TOTAL ABDOMINAL HYSTERECTOMY  1988   ovarian cyst  BSO    Current Outpatient Medications  Medication Sig Dispense Refill   ALPRAZolam (XANAX) 0.25 MG tablet Take 0.25-0.5 mg by mouth at bedtime.      apixaban (ELIQUIS) 5 MG TABS tablet TAKE (1) TABLET BY MOUTH TWICE DAILY. 28 tablet 0   cholecalciferol (VITAMIN D3) 25 MCG (1000 UNIT) tablet Take 1,000 Units by mouth daily.     estrogens, conjugated, (PREMARIN) 0.625 MG  tablet Take 1 tablet (0.625 mg total) by mouth every Monday. 30 tablet 2   HYDROcodone-acetaminophen (NORCO/VICODIN) 5-325 MG tablet Take 0.5-1 tablets by mouth at bedtime.      levothyroxine (SYNTHROID, LEVOTHROID) 75 MCG tablet Take 75 mcg by mouth every morning.      metoprolol tartrate (LOPRESSOR) 25 MG tablet TAKE (1/2) TABLET BY MOUTH TWICE DAILY. (Patient taking differently: Take 12.5 mg by mouth  2 (two) times daily. ) 90 tablet 3   Multiple Vitamin (MULTIVITAMIN WITH MINERALS) TABS tablet Take 1 tablet by mouth daily.     Omega-3 Fatty Acids (FISH OIL) 1200 MG CAPS Take 1,200 mg by mouth daily.     rOPINIRole (REQUIP) 0.25 MG tablet Take 0.75 mg by mouth at bedtime.      No current facility-administered medications for this visit.   Facility-Administered Medications Ordered in Other Visits  Medication Dose Route Frequency Provider Last Rate Last Admin   0.9 %  sodium chloride infusion   Intravenous Once Ladell Pier, MD       0.9 %  sodium chloride infusion   Intravenous Once Ladell Pier, MD       Allergies:  Patient has no known allergies.   Social History: The patient  reports that she has never smoked. She has never used smokeless tobacco. She reports that she does not drink alcohol and does not use drugs.   Family History: The patient's family history includes Breast cancer in her paternal grandmother; Cancer in her paternal grandfather, paternal grandmother, and sister; Diabetes in her sister; Heart disease in her mother and sister.   ROS:  Please see the history of present illness. Otherwise, complete review of systems is positive for none.  All other systems are reviewed and negative.   Physical Exam: VS:  BP 100/64    Pulse 64    Ht 5' 6.5" (1.689 m)    Wt 145 lb 6.4 oz (66 kg)    SpO2 (!) 64%    BMI 23.12 kg/m , BMI Body mass index is 23.12 kg/m.  Wt Readings from Last 3 Encounters:  03/03/20 145 lb 6.4 oz (66 kg)  03/03/20 143 lb 8 oz (65.1 kg)  02/27/20 146 lb 3.2 oz (66.3 kg)    General: Patient appears comfortable at rest. Neck: Supple, no elevated JVP or carotid bruits, no thyromegaly. Lungs: Clear to auscultation, nonlabored breathing at rest. Cardiac: Regular rate and rhythm, no S3 or significant systolic murmur, no pericardial rub. Extremities: No pitting edema, distal pulses 2+. Skin: Warm and dry. Musculoskeletal: No  kyphosis. Neuropsychiatric: Alert and oriented x3, affect grossly appropriate.  ECG:  An ECG dated 02/03/2020 was personally reviewed today and demonstrated:  Sinus bradycardia rate of 47 at [place]: 12/25/2019: ALT 25; AST 27; BUN 13; Creatinine 0.82; Potassium 4.5; Sodium 138 01/06/2020: Hemoglobin 12.1; Platelets 214  No results found for: CHOL, TRIG, HDL, CHOLHDL, VLDL, LDLCALC, LDLDIRECT  Other Studies Reviewed Today:  Diagnostic Studies 12/2014 echo Study Conclusions  - Left ventricle: The cavity size was normal. Wall thickness was normal. Systolic function was normal. The estimated ejection fraction was in the range of 55% to 60%. Wall motion was normal; there were no regional wall motion abnormalities. Left ventricular diastolic function parameters were normal. - Mitral valve: Mildly calcified annulus. There was mild regurgitation. - Left atrium: The atrium was moderately dilated. Volume/bsa, S: 37.9 ml/m^2. - Right atrium: The atrium was mildly to moderately dilated. - Tricuspid valve:  There was mild-moderate regurgitation.  Assessment and Plan:  1. Paroxysmal atrial fibrillation (HCC)    Denies any recent palpitations or arrhythmias.  Pulses regular and strong with a rate of 64.  Continue Eliquis 5 mg p.o. twice daily.  Continue metoprolol 12.5 mg p.o. twice daily.  Medication Adjustments/Labs and Tests Ordered: Current medicines are reviewed at length with the patient today.  Concerns regarding medicines are outlined above.   Disposition: Follow-up with Dr. Harl Bowie or APP 1 year  Signed, Levell July, NP 03/03/2020 4:49 PM    [facility] [place], [place], [place] [address] Phone: [phone]; Fax: [phone]

## 2020-03-03 NOTE — Patient Instructions (Addendum)

## 2020-03-03 NOTE — Progress Notes (Signed)
[facility] OFFICE PROGRESS NOTE   Diagnosis: Gynecologic malignancy  INTERVAL HISTORY:   [name] underwent surgical room movement of the abdominal/pelvic mass at [facility] on 02/06/2020.  This included midline excision of tumor and soft tissue of the abdominal wall.  There was a nodular mass fixed to the pubic arch.  No evidence of disease on the omentum or bowel.  The anterior abdominal wall and bladder dome were free of disease.  Soft tissue nodules on the anterior peritoneum were ablated.  No palpable tumor at the end of the resection.  She reports an uneventful operative recovery.  The pathology revealed a clear cell carcinoma involving the cauterized tissue edges.  The tumor measured 5.5 cm in aggregate.  She saw Dr. Berline Lopes in follow-up on 02/27/2020.  Dr. Berline Lopes recommends considering adjuvant radiation.  Objective:  Vital signs in last 24 hours:  Blood pressure (!) 116/55, pulse 60, temperature 97.8 F (36.6 C), temperature source Tympanic, resp. rate 18, height 5' 6.5" (1.689 m), weight 143 lb 8 oz (65.1 kg), SpO2 100 %.    Resp: Lungs clear bilaterally Cardio: Regular rate and rhythm GI: No mass, nontender, no apparent ascites, healed low transverse incision inferior to the remote incision.  Soft fullness surrounding the incision with an apparent reducible hernia.  No mass. Vascular: No leg edema  Lab Results:  Lab Results  Component Value Date   WBC 4.5 01/06/2020   HGB 12.1 01/06/2020   HCT 36.6 01/06/2020   MCV 99.2 01/06/2020   PLT 214 01/06/2020   NEUTROABS 2.0 10/19/2018    CMP  Lab Results  Component Value Date   NA 138 12/25/2019   K 4.5 12/25/2019   CL 106 12/25/2019   CO2 25 12/25/2019   GLUCOSE 109 (H) 12/25/2019   BUN 13 12/25/2019   CREATININE 0.82 12/25/2019   CALCIUM 10.6 (H) 12/25/2019   PROT 7.0 12/25/2019   ALBUMIN 3.7 12/25/2019   AST 27 12/25/2019   ALT 25 12/25/2019   ALKPHOS 51 12/25/2019   BILITOT 0.3 12/25/2019    GFRNONAA >60 12/25/2019   GFRAA >60 12/25/2019    Medications: I have reviewed the patient's current medications.   Assessment/Plan:  1.  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   Elevated CA 125.   Cycle 1 Taxol/carboplatin 08/16/2013.   Cycle 2 Taxol/carboplatin 09/06/2013.   Cycle 3 Taxol/carboplatin 09/27/2013   CT 10/11/2013 with a stable abdominal wall mass.  Status post resection of abdominal wall mass with abdominal wall reconstruction 11/05/2013. Final pathology showed adenocarcinoma. Specimen extensively involved with adenocarcinoma which focally involved the edge of the specimen. Immunostains and Foundation 1 testing were nonspecific for a primary tumor location.  Adjuvant radiation to the abdominal wall 01/16/2014 through 02/27/2014  CT 01/15/2018 while in the emergency room for evaluation of back pain-midline abdominal wall mass above the pubic symphysis and adjacent to the bladder  Ultrasound-guided biopsy of the abdominal wall mass 01/25/2018-adenocarcinoma similar to the 2015 biopsy  PET scan 03/01/2018-hypermetabolism associated with a cystic and solid suprapubic mass. Focal uptake identified in the left L3-4 facets without underlying bony lesion. No other sites of unexpected or suspicious hypermetabolism in the neck, chest, abdomen or pelvis.  Exploratory laparotomy, resection of abdominal wall tumor, oversew of bladder peritoneum, cystoscopy 03/28/2018(findings included a cystic and solid mass encountered below the lower edge of the  incision extending into the space ofretziusand adherent to the pubic arch inferiorly, bladder dome posteriorly and prior abdominal wall repair anteriorly. No nodularity palpated in the tumor bed. Visualized bowel and omentum free of disease. Pelvic peritoneum appeared normal). A "small  "amount of tumor on the periosteum was cauterized using a Bovie.  Pathology on pelvic tumor-metastatic high-grade adenocarcinoma, favor clear-cell carcinoma of gynecologic origin, fragments up to 5.8 cm in size; ER negative, PR positive, 1+ (weak staining) to focally 2+, 30-40%, MSI-stable, mutation burden-4,CREBBPalteration  Cycle 1 gemcitabine/carboplatin 05/21/2018; day 8 held due to neutropenia  Cycle2 gemcitabine/carboplatin 06/11/2018 (day 8 gemcitabine eliminated)  Cycle 3 gemcitabine/carboplatin 07/06/2018  Cycle 4 gemcitabine/carboplatin 07/27/2018 (Udenyca added)  Cycle 5 gemcitabine/carboplatin 08/17/2018  Cycle 6 gemcitabine/carboplatin 09/07/2018   CT 10/19/2018- low anterior abdominal wall mass no longer identified, soft tissue thickening cephalad to the site of prior mass-nonspecific, no additional evidence of metastatic disease  CT 04/18/2019-no evidence of recurrent or metastatic disease  CT 12/23/2019-slight increase in size of ventral midline soft tissue mass near the pubis, no other evidence of disease progression  Biopsy pelvic body wall soft tissue mass 01/06/2020-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  Surgical excision of the soft tissue mass adherent to the pubis 02/06/2020-clear cell carcinoma, positive surgical margins 2. Ovarian cancer in 1988, low-grade adenocarcinoma with areas of "borderline" carcinoma, status post a hysterectomy and bilateral oophorectomy. 3. New onset atrial fibrillation August 2016, maintained on eliquis 4. Recurrent urinary tract infections 5. Restless legs     Disposition: [name] underwent excision of the locally recurrent tumor.  The pathology confirmed clear cell carcinoma similar to the previous pathology.  The resection margins are positive.  She has previously received abdominal wall radiation, but she may be a candidate for additional radiation at this inferior site.  I see  no role for adjuvant systemic therapy.  The tumor did not respond to chemotherapy in the past.  We will follow up on results of germline testing to see whether she will be a candidate for a targeted therapy.  [name] will return for an office visit in 3 months.  [name], MD  03/03/2020  11:11 AM

## 2020-03-06 ENCOUNTER — Encounter: Payer: Medicare Other | Admitting: Gynecologic Oncology

## 2020-03-09 ENCOUNTER — Telehealth: Payer: Self-pay | Admitting: Radiation Oncology

## 2020-03-09 NOTE — Telephone Encounter (Signed)
Per Shona Simpson, PA in basket message to offer patient a FUN appt. From referral of Dr. Benay Spice. I have scheduled the patient for a telephone visit for 10/28 beginning at 8am.

## 2020-03-10 DIAGNOSIS — E039 Hypothyroidism, unspecified: Secondary | ICD-10-CM | POA: Diagnosis not present

## 2020-03-10 DIAGNOSIS — E049 Nontoxic goiter, unspecified: Secondary | ICD-10-CM | POA: Diagnosis not present

## 2020-03-11 ENCOUNTER — Other Ambulatory Visit: Payer: Self-pay

## 2020-03-11 ENCOUNTER — Encounter: Payer: Self-pay | Admitting: Radiation Oncology

## 2020-03-11 DIAGNOSIS — E039 Hypothyroidism, unspecified: Secondary | ICD-10-CM | POA: Diagnosis not present

## 2020-03-11 DIAGNOSIS — E049 Nontoxic goiter, unspecified: Secondary | ICD-10-CM | POA: Diagnosis not present

## 2020-03-12 ENCOUNTER — Ambulatory Visit
Admission: RE | Admit: 2020-03-12 | Discharge: 2020-03-12 | Disposition: A | Discharge status: Home / Self Care | Payer: Medicare Other | Source: Ambulatory Visit | Attending: Radiation Oncology | Admitting: Radiation Oncology

## 2020-03-12 DIAGNOSIS — Z923 Personal history of irradiation: Secondary | ICD-10-CM | POA: Diagnosis not present

## 2020-03-12 DIAGNOSIS — C801 Malignant (primary) neoplasm, unspecified: Secondary | ICD-10-CM

## 2020-03-12 DIAGNOSIS — C44599 Other specified malignant neoplasm of skin of other part of trunk: Secondary | ICD-10-CM | POA: Diagnosis not present

## 2020-03-12 DIAGNOSIS — Z8543 Personal history of malignant neoplasm of ovary: Secondary | ICD-10-CM | POA: Diagnosis not present

## 2020-03-12 DIAGNOSIS — C44509 Unspecified malignant neoplasm of skin of other part of trunk: Secondary | ICD-10-CM

## 2020-03-12 NOTE — Progress Notes (Signed)
Radiation Oncology         [phone] ________________________________  Initial Outpatient Consultation - Conducted via telephone due to current COVID-19 concerns for limiting patient exposure  I spoke with the patient to conduct this consult visit via telephone to spare the patient unnecessary potential exposure in the healthcare setting during the current COVID-19 pandemic. The patient was notified in advance and was offered a [place] meeting to allow for face to face communication but unfortunately reported that they did not have the appropriate resources/technology to support such a visit and instead preferred to proceed with a telephone consult.    Name: [name]        MRN: [id number]  Date of Service: 03/12/2020 DOB: 1945/05/24  IO:[id number], Jenny Reichmann, MD  Ladell Pier, MD     REFERRING PHYSICIAN: Ladell Pier, MD   DIAGNOSIS: The primary encounter diagnosis was Adenocarcinoma of abdominal wall, unclear primary. A diagnosis of Neoplasm of abdominal wall of uncertain behavior was also pertinent to this visit.   HISTORY OF PRESENT ILLNESS: [name] is a 74 y.o. female seen at the request of Dr. 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  Apparently her disease was originally diagnosed in 1988 incidentally at the time of a hysterectomy and classified this left ovarian tissue is focal well differentiated serous cystadenocarcinoma, and that this was a rare foci in a separate report apparently showed a small focus of papillary adenocarcinoma and a serous cystadenofibroma of the left ovary.  Apparently her cervix uterus left fallopian tube and right adnexa and pelvic washings were all negative.  Her appendectomy performed at that time was also negative for disease.  She had a CA-125 at that time that was in the low 30s and did not receive adjuvant therapy, in 2015 she was diagnosed with a abdominal wall recurrence in the right rectus muscle, a biopsy  showed adenocarcinoma of unknown primary and she received 3 cycles of chemotherapy with Taxol and carboplatin apparently there was no response and she underwent surgical resection and then subsequently adjuvant radiotherapy because of a positive margin, she has been followed expectantly since that time, and had recurrence requiring resection of abdominal wall tumor oversewing of bladder peritoneum and cystoscopy in November 2019 with her pathology revealing 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  She received carboplatin and Gemzar therapy.  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  Her tumor was ER negative PR 1+20%.  Additional genetic testing both germline and somatic testing is being performed, and this will determine possible systemic therapy.  She is contacted today to discuss the options of additional radiotherapy as the site of recent recurrence is below the level of her prior treatment.    PREVIOUS RADIATION THERAPY: Yes   01/16/2014-02/27/2014:  The patient was treated initially to a dose of 45 gray to the target region within the abdominal wall postoperative area. She received 45 gray using a 4 field 3-D conformal technique at  1.8 gray per fraction. The patient then received a boost using a cone down 4 field technique for an additional 9 Gray. The final dose was 54 gray.   PAST MEDICAL HISTORY:  Past Medical History:  Diagnosis Date  .  Anxiety    new dx  . Atrial fibrillation ([place])   . Cancer ([place]) 1988, 2015   ovarian, adenocarcinoma  . GERD (gastroesophageal reflux disease)    hx of years ago  . Hx of radiation therapy 01/16/14-02/27/14   abdomen   . Hypercholesteremia    under control with diet and fish oil  . Hypertension   . Ovarian cyst   . PONV (postoperative nausea and vomiting)   . Restless leg syndrome   . Thyroid disease        PAST SURGICAL HISTORY: Past Surgical History:  Procedure Laterality Date  . APPENDECTOMY    . APPLICATION OF A-CELL OF CHEST/ABDOMEN N/A 11/05/2013   Procedure: ABDOMINAL WALL RESCONTRUCTION WITH STRATUS;  Surgeon: Irene Limbo, MD;  Location: [facility];  Service: Plastics;  Laterality: N/A;  . CHOLECYSTECTOMY  2009  . LAPAROTOMY N/A 11/05/2013   Procedure: RESECTION OF PELVIC MASS;  Surgeon: Imagene Gurney A. Alycia Rossetti, MD;  Location: [facility];  Service: Gynecology;  Laterality: N/A;  . LAPAROTOMY  03/2018   excision of suprapubic mass at [facility]  . OOPHORECTOMY     BSO  . TONSILLECTOMY  as child  . TOTAL ABDOMINAL HYSTERECTOMY  1988   ovarian cyst  BSO     FAMILY HISTORY:  Family History  Problem Relation Age of Onset  . Heart disease Mother   . Heart disease Sister   . Diabetes Sister   . Cancer Sister        melanoma-skin cancer  . Breast cancer Paternal Grandmother        Age 67's  . Cancer Paternal Grandmother        breast  . Cancer Paternal Grandfather        prostate/bladder     SOCIAL HISTORY:  reports that she has never smoked. She has never used smokeless tobacco. She reports that she does not drink alcohol and does not use drugs.  The patient is single and resides in [place]. She is retired from working as a Solicitor.    ALLERGIES: Patient has no known allergies.   MEDICATIONS:  Current Outpatient Medications  Medication Sig Dispense Refill  . ALPRAZolam (XANAX) 0.25 MG tablet Take 0.25-0.5 mg by mouth at bedtime.     Marland Kitchen apixaban (ELIQUIS) 5 MG  TABS tablet TAKE (1) TABLET BY MOUTH TWICE DAILY. 28 tablet 0  . cholecalciferol (VITAMIN D3) 25 MCG (1000 UNIT) tablet Take 1,000 Units by mouth daily.    Marland Kitchen estrogens, conjugated, (PREMARIN) 0.625 MG tablet Take 1 tablet (0.625 mg total) by mouth every Monday. 30 tablet 2  . HYDROcodone-acetaminophen (NORCO/VICODIN) 5-325 MG tablet Take 0.5-1 tablets by mouth at bedtime.     Marland Kitchen levothyroxine (SYNTHROID, LEVOTHROID) 75 MCG tablet Take 75 mcg by mouth every morning.     . metoprolol tartrate (LOPRESSOR) 25 MG tablet TAKE (1/2) TABLET BY MOUTH TWICE DAILY. (Patient taking differently: Take 12.5 mg by mouth 2 (two) times daily. ) 90 tablet 3  . Multiple Vitamin (MULTIVITAMIN WITH MINERALS) TABS tablet Take 1 tablet by mouth daily.    Marland Kitchen rOPINIRole (REQUIP) 0.25 MG tablet Take 0.75 mg by mouth at bedtime.     . Omega-3 Fatty Acids (FISH OIL) 1200 MG CAPS Take 1,200  mg by mouth daily. (Patient not taking: Reported on 03/11/2020)     No current facility-administered medications for this encounter.   Facility-Administered Medications Ordered in Other Encounters  Medication Dose Route Frequency Provider Last Rate Last Admin  . 0.9 %  sodium chloride infusion   Intravenous Once [name] B, MD      . 0.9 %  sodium chloride infusion   Intravenous Once Ladell Pier, MD         REVIEW OF SYSTEMS: On review of systems, the patient reports that she is doing well without abdominal or pelvic pain. She denies any nausea, vomiting, difficulty with bowel or bladder activity. She is hopeful that this treatment will delay further recurrence.    PHYSICAL EXAM:  Unable to assess given encounter type.  ECOG = 0  0 - Asymptomatic (Fully active, able to carry on all predisease activities without restriction)  1 - Symptomatic but completely ambulatory (Restricted in physically strenuous activity but ambulatory and able to carry out work of a light or sedentary nature. For example, light housework, office  work)  2 - Symptomatic, <50% in bed during the day (Ambulatory and capable of all self care but unable to carry out any work activities. Up and about more than 50% of waking hours)  3 - Symptomatic, >50% in bed, but not bedbound (Capable of only limited self-care, confined to bed or chair 50% or more of waking hours)  4 - Bedbound (Completely disabled. Cannot carry on any self-care. Totally confined to bed or chair)  5 - Death   Eustace Pen MM, Creech RH, Tormey DC, et al. [phone]). "Toxicity and response criteria of the [facility]". [place] Oncol. 5 (6): 649-55    LABORATORY DATA:  Lab Results  Component Value Date   WBC 4.5 01/06/2020   HGB 12.1 01/06/2020   HCT 36.6 01/06/2020   MCV 99.2 01/06/2020   PLT 214 01/06/2020   Lab Results  Component Value Date   NA 138 12/25/2019   K 4.5 12/25/2019   CL 106 12/25/2019   CO2 25 12/25/2019   Lab Results  Component Value Date   ALT 25 12/25/2019   AST 27 12/25/2019   ALKPHOS 51 12/25/2019   BILITOT 0.3 12/25/2019      RADIOGRAPHY: No results found.     IMPRESSION/PLAN: 1. Progressive metastatic carcinoma clear cell variety arising in the left adnexa involving theabdominal wall at the level of the pubic symphysis. Dr. Lisbeth Renshaw discusses the pathology findings and reviews the nature of recurrent GYN disease and the rationale to consider another course of radiotherapy as this site is below her prior treatment. We discussed the risks, benefits, short, and long term effects of radiotherapy, and the patient is interested in proceeding. Dr. Lisbeth Renshaw discusses the delivery and logistics of radiotherapy and anticipates a course of about 6 weeks of radiotherapy. We will plan for simulation on  03/16/20 at which time she will sign written consent to proceed.   Given current concerns for patient exposure during the COVID-19 pandemic, this encounter was conducted via telephone.  The patient has provided two factor  identification and has given verbal consent for this type of encounter and has been advised to only accept a meeting of this type in a secure network environment. The time spent during this encounter was 45 minutes including preparation, discussion, and coordination of the patient's care. The attendants for this meeting include Dr. Lisbeth Renshaw, Hayden Pedro  and Acquanetta Sit  Mancel Bale.  During the encounter, Dr. Lisbeth Renshaw, and Hayden Pedro were located at [facility] Radiation Oncology Department.  [name] was located at home.   The above documentation reflects my direct findings during this shared patient visit. Please see the separate note by Dr. Lisbeth Renshaw on this date for the remainder of the patient's plan of care.    Carola Rhine, PAC

## 2020-03-14 DIAGNOSIS — E7849 Other hyperlipidemia: Secondary | ICD-10-CM | POA: Diagnosis not present

## 2020-03-14 DIAGNOSIS — I4891 Unspecified atrial fibrillation: Secondary | ICD-10-CM | POA: Diagnosis not present

## 2020-03-16 ENCOUNTER — Other Ambulatory Visit: Payer: Self-pay

## 2020-03-16 ENCOUNTER — Ambulatory Visit
Admission: RE | Admit: 2020-03-16 | Discharge: 2020-03-16 | Disposition: A | Discharge status: Home / Self Care | Payer: Medicare Other | Source: Ambulatory Visit | Attending: Radiation Oncology | Admitting: Radiation Oncology

## 2020-03-16 DIAGNOSIS — C7982 Secondary malignant neoplasm of genital organs: Secondary | ICD-10-CM | POA: Insufficient documentation

## 2020-03-16 DIAGNOSIS — C494 Malignant neoplasm of connective and soft tissue of abdomen: Secondary | ICD-10-CM | POA: Diagnosis not present

## 2020-03-16 DIAGNOSIS — Z510 Encounter for antineoplastic radiation therapy: Secondary | ICD-10-CM | POA: Diagnosis not present

## 2020-03-20 DIAGNOSIS — G2581 Restless legs syndrome: Secondary | ICD-10-CM | POA: Diagnosis not present

## 2020-03-20 DIAGNOSIS — E7849 Other hyperlipidemia: Secondary | ICD-10-CM | POA: Diagnosis not present

## 2020-03-20 DIAGNOSIS — E538 Deficiency of other specified B group vitamins: Secondary | ICD-10-CM | POA: Diagnosis not present

## 2020-03-20 DIAGNOSIS — Z0000 Encounter for general adult medical examination without abnormal findings: Secondary | ICD-10-CM | POA: Diagnosis not present

## 2020-03-20 DIAGNOSIS — Z1389 Encounter for screening for other disorder: Secondary | ICD-10-CM | POA: Diagnosis not present

## 2020-03-20 DIAGNOSIS — Z6822 Body mass index (BMI) 22.0-22.9, adult: Secondary | ICD-10-CM | POA: Diagnosis not present

## 2020-03-20 DIAGNOSIS — E063 Autoimmune thyroiditis: Secondary | ICD-10-CM | POA: Diagnosis not present

## 2020-03-20 DIAGNOSIS — I4891 Unspecified atrial fibrillation: Secondary | ICD-10-CM | POA: Diagnosis not present

## 2020-03-20 DIAGNOSIS — Z1331 Encounter for screening for depression: Secondary | ICD-10-CM | POA: Diagnosis not present

## 2020-03-20 DIAGNOSIS — E559 Vitamin D deficiency, unspecified: Secondary | ICD-10-CM | POA: Diagnosis not present

## 2020-03-25 ENCOUNTER — Ambulatory Visit
Admission: RE | Admit: 2020-03-25 | Discharge: 2020-03-25 | Disposition: A | Discharge status: Home / Self Care | Payer: Medicare Other | Source: Ambulatory Visit | Attending: Radiation Oncology | Admitting: Radiation Oncology

## 2020-03-25 ENCOUNTER — Other Ambulatory Visit: Payer: Self-pay

## 2020-03-25 DIAGNOSIS — Z510 Encounter for antineoplastic radiation therapy: Secondary | ICD-10-CM | POA: Diagnosis not present

## 2020-03-25 DIAGNOSIS — C7982 Secondary malignant neoplasm of genital organs: Secondary | ICD-10-CM | POA: Diagnosis not present

## 2020-03-25 DIAGNOSIS — C494 Malignant neoplasm of connective and soft tissue of abdomen: Secondary | ICD-10-CM | POA: Diagnosis not present

## 2020-03-26 ENCOUNTER — Encounter: Payer: Self-pay | Admitting: Gynecologic Oncology

## 2020-03-26 ENCOUNTER — Telehealth: Payer: Self-pay

## 2020-03-26 ENCOUNTER — Ambulatory Visit
Admission: RE | Admit: 2020-03-26 | Discharge: 2020-03-26 | Disposition: A | Discharge status: Home / Self Care | Payer: Medicare Other | Source: Ambulatory Visit | Attending: Radiation Oncology | Admitting: Radiation Oncology

## 2020-03-26 DIAGNOSIS — Z510 Encounter for antineoplastic radiation therapy: Secondary | ICD-10-CM | POA: Diagnosis not present

## 2020-03-26 DIAGNOSIS — C494 Malignant neoplasm of connective and soft tissue of abdomen: Secondary | ICD-10-CM | POA: Diagnosis not present

## 2020-03-26 DIAGNOSIS — C7982 Secondary malignant neoplasm of genital organs: Secondary | ICD-10-CM | POA: Diagnosis not present

## 2020-03-26 NOTE — Telephone Encounter (Signed)
TC to patient to review meaningful use questions.  No answer, left message to return call.   

## 2020-03-26 NOTE — Progress Notes (Signed)
Pt here for patient teaching.  Pt given Radiation and You booklet and skin care instructions.  Reviewed areas of pertinence such as diarrhea, fatigue, hair loss, nausea and vomiting, sexual and fertility changes, skin changes and urinary and bladder changes . Pt able to give teach back of to pat skin, use unscented/gentle soap, use baby wipes, have Imodium on hand, drink plenty of water and sitz bath,avoid applying anything to skin within 4 hours of treatment. Pt verbalizes understanding of information given and will contact nursing with any questions or concerns.     Gloriajean Dell. Leonie Green, BSN

## 2020-03-26 NOTE — Progress Notes (Signed)
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 Pathology report says "focal well differentiated serous cystadenocarcinoma". The comment is that the adenocarcinomatous component is limited to rare foci of small invasive glandular structures. Another report states that there is a small focus of papillary adenocarcinoma in a serous cystadenofibroma of the left ovary. Uterus, cervix, left fallopian tube, right adnexa and pelvic washings all without evidence of malignancy. Appendix also removed at the time of surgery.  Her CA-125 at that time was reported to be 33. She did not receive adjuvant therapy for her initial cancer diagnosis.  She presented in 07/2013 with abdominal pain and mass was noted along right inferior rectus muscle. Biopsy of the mass showed adenocarcinoma of unknown primary. She received 3 cycles of neoadjuvant chemotherapy (no response), carboplatin/taxol. After resection, she underwent RT due to positie tumor margin. This was completed in 02/2014.  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 Lower abdominal/suprapubic ventral wall lobulated soft tissue  mass, arising within the lower rectus musculature more so on the  right. 5.5 x 7.9 x 9.7 cm. Favor sarcoma or other connective tissue  tumor. This should be amenable to percutaneous biopsy. In a younger  female patient, endometriosis within a Cesarean section scar would  also be a consideration for  this appearance.  2. Narrow fat plane between the mass in #1 and underlying small  bowel and bladder. No lymphadenopathy. No ascites.  3. Surgically absent gallbladder, uterus, and adnexa. Diverticulosis  of the colon.  Study discussed by telephone with PA BENJAMIN MANN on 07/19/2013 at  11:35 .    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 Microscopic Comment Needle core biopsies demonstrate diffuse involvement by well differentiated adenocarcinoma. The adenocarcinoma has the following immunophenotype: Cytokeratin 7 - strong diffuse expression Cytokeratin 20 - negative expression CDX2 - negative expression TTF-1 - negative expression Estrogen receptor - negative expression Vimentin - patchy mild to moderate expression   08/16/2013 - 09/27/2013 Chemotherapy   3 cycle of carboplatin, paclitaxel    10/11/2013 Imaging   CT A/P: A heterogeneous mass centered in the inferior right rectus abdominus  muscle measures 5.3 x 7.5 cm (previously 5.5 x 7.9 cm). No  pathologically enlarged lymph nodes. Scattered atherosclerotic  calcification of the arterial vasculature without abdominal aortic  aneurysm. No free fluid. No worrisome lytic or sclerotic lesions.  Degenerative changes are seen in the spine.   IMPRESSION:  Right rectus abdominus mass is stable to very minimally smaller than  on 07/19/2013.   11/05/2013 Surgery   Resection abdominal wall mass, abdominal wall reconstruction with Strattice matrix (10x11cm)  Operative findings: 8 involving the subcutaneous tissues, fascia, and rectus on the right side. No obvious intraperitoneal disease. Normal-appearing omentum   11/05/2013 Pathology Results   Soft tissue mass, simple excision, abdominal wall mass ADENOCARCINOMA. Microscopic Comment The specimen is  extensively  involved by adenocarcinoma which focally involves the edge of the specimen. Immunohistochemistry is performed and the tumor shows patchy positivity with Napsin-A and is negative with WT-1, estrogen receptor, progesterone receptor, gross disease cystic fluid protein, CDX-2, carcinoembryonic antigen, thyroid transcription factor-1, CD10 and CD117. The immunophenotype is nonspecific and additional immunohistochemistry will be performed and reported as an addendum. (JDP:kh 11/07/13) ADDENDUM: Additional immunohistochemistry is performed and the tumor is positive with cytokeratin AE1/AE3 and cytokeratin 7 and is negative with Calretinin, cytokeratin 5/6 and cytokeratin 20. The immunoreactivity is not specific for location of a primary tumor. (JDP:kh 11-08-13)   11/05/2013 Genetic Testing   [facility] testing MYC amplification - equivocal CREBBP Q1765* No FDA approved therapies in patient's tumor type or another tumor type    01/16/2014 - 02/27/2014 Radiation Therapy   45 gray to the target region within the abdominal wall postoperative area. She received 45 gray using a 4 field 3-D conformal technique at 1.8 gray per fraction. The patient then received a boost using a cone down 4 field technique for an additional 9 Gray. The final dose was 54 gray.   01/29/2018 Pathology Results   Soft Tissue Needle Core Biopsy, ant abd wall mass - ADENOCARCINOMA. - SEE MICROSCOPIC DESCRIPTION. Microscopic Comment The core biopsies consist of abundant dense, hyalinized fibrous tissue with scattered glands with cytologic atypia consistent with adenocarcinoma. Immunohistochemistry shows the tumor is positive with cytokeratin AE1/AE3 and cytokeratin 7 and is negative with cytokeratin 5/6, Calretinin, WT-1, estrogen receptor, progesterone receptor and cytokeratin 20. The morphology and immunophenotype are similar to the morphology in the abdominal wall mass excision from 11/07/2013 [phone]).   03/01/2018  Imaging   PET: 1. Hypermetabolism associated with the cystic and solid suprapubic mass compatible with the reported clinical history of adenocarcinoma recurrence. Cystic components in the cranial aspect of the lesion are largely devoid of FDG accumulation with the most hypermetabolic tissue being inferiorly, immediately adjacent to the symphysis pubis. 2. Focal uptake identified in the left L3-4 facets without underlying bony lesion. This is presumed to represent uptake related to degenerative change. 3. No other sites of unexpected or suspicious hypermetabolism in the neck, chest, abdomen, or pelvis   03/02/2018 Imaging   CT A/P: 6.7 cm mass in the lower anterior abdominal wall containing cystic and solid components, suspicious for recurrence of previously resected/treated adenocarcinoma in this region. This abuts and could invade the anterior wall of the bladder.   No evidence for more distant metastatic disease within the abdomen or pelvis.    03/28/2018 Surgery   Exploratory laparotomy, resection abdominal wall tumor, oversew of bladder peritoneum, cystoscopy.  Findings: normal external genitalia, unable to palpate mass on exam. After opening the fascia, a cystic and solid mass was encountered below the lower edge of the incision, extending into the space of retzius and adherent to the pubic arch inferiorly, the bladder dome posteriorly, and prior abdominal wall repair anteriorly. At conclusion of the case, no nodularity was palpated in the tumor bed. Visualized bowel and omentum were free of disease. Pelvic peritoenum appeared normal. Cystoscopy revealed intact bladder mucosa with no visible suture. Bilateral ureteral orfices visualized.   03/28/2018 Pathology Results   Pelvic tumor, excision - Metastatic high grade adenocarcinoma, favor clear cell carcinoma of gynecologic origin, fragments up to 5.8 cm in size - See comment In the prior biopsy specimen [id number], the malignant  glands were positive for AE1/AE3 and CK7, and negative for CK20, CK5/6, calretinin, WT-1, ER, and PR. Additional immunohistochemical studies performed  on block A5 in the current specimen demonstrates that the tumor is positive for Napsin A and PAX-8, and is negative for TTF-1. 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   03/28/2018 Genetic Testing   [employer] No reportable alterations with companion diagnostic claims MS-stable, TMB - 4 Muts/Mb, CREBBP P3790*   05/21/2018 -  Chemotherapy   The patient had palonosetron (ALOXI) injection 0.25 mg, 0.25 mg, Intravenous,  Once, 6 of 6 cycles Administration: 0.25 mg (05/21/2018), 0.25 mg (07/27/2018), 0.25 mg (06/11/2018), 0.25 mg (07/06/2018), 0.25 mg (08/17/2018), 0.25 mg (09/07/2018) pegfilgrastim-cbqv (UDENYCA) injection 6 mg, 6 mg, Subcutaneous, Once, 3 of 3 cycles Administration: 6 mg (07/30/2018), 6 mg (09/08/2018) CARBOplatin (PARAPLATIN) 310 mg in sodium chloride 0.9 % 250 mL chemo infusion, 310 mg (100 % of original dose 306.4 mg), Intravenous,  Once, 6 of 6 cycles Dose modification:   (original dose 306.4 mg, Cycle 1),   (original dose 306.4 mg, Cycle 4) Administration: 310 mg (05/21/2018), 300 mg (07/27/2018), 300 mg (06/11/2018), 300 mg (07/06/2018), 300 mg (08/17/2018), 300 mg (09/07/2018) gemcitabine (GEMZAR) 1,368 mg in sodium chloride 0.9 % 250 mL chemo infusion, 800 mg/m2 = 1,368 mg (100 % of original dose 800 mg/m2), Intravenous,  Once, 6 of 6 cycles Dose modification: 800 mg/m2 (original dose 800 mg/m2, Cycle 1, Reason: Provider Judgment) Administration: 1,368 mg (05/21/2018), 1,368 mg (07/27/2018), 1,368 mg (06/11/2018), 1,368 mg (07/06/2018), 1,368 mg (08/17/2018), 1,368 mg (09/07/2018)  for chemotherapy  treatment.     Pathology Results   A. SOFT TISSUE MASS, PELVIC BODY WALL, NEEDLE CORE BIOPSY:  - Metastatic adenocarcinoma, see comment.   COMMENT:   The tumor has malignant cells with cleared cytoplasm.  Immunohistochemistry is positive for cytokeratin 7, PAX8, racemase, and NapsinA. The cells are negative for ER, WT-1, and CD10. 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 Dr. Benay Spice was notified on 01/08/20.    10/19/2018 Imaging   CT A/P: 1. Previously noted lower anterior abdominal wall mass is no longer identified, presumably surgically resected. Cephalad to the site of the prior mass within the low anterior abdominal wall there is some soft tissue thickening seen on today's examination (axial image 54 of series 2). This is nonspecific and will serve as a baseline for future follow-up examinations. 2. No signs of metastatic disease elsewhere in the abdomen or pelvis. 3. Colonic diverticulosis without evidence of acute diverticulitis at this time. 4. Aortic atherosclerosis. 5. Additional incidental findings, as above.   04/18/2019 Imaging   CT A/P: 1. No evidence of recurrent or metastatic carcinoma within the abdomen or pelvis. 2. Colonic diverticulosis. No radiographic evidence of diverticulitis.   12/23/2019 Imaging   CT A/P: Within the ventral, midline abdominal wall soft tissue mass is identified anterior to the pubic symphysis measuring 5.2 x 3.3 by 3.4 cm (volume = 31 cm^3), image 59/5 and image 78/2. This is compared with 4.9 x 3.0 by 2.6 (volume = 20)cm IMPRESSION: 1. Stable to slight increase in size of ventral, midline abdominal wall soft tissue mass at the level of the pubic symphysis. 2. No signs of solid organ or nodal metastases. 3. Aortic atherosclerosis.   01/06/2020 Relapse/Recurrence   Anterior abdominal wall mass biopsy: A. SOFT TISSUE MASS, PELVIC BODY  WALL,  NEEDLE CORE BIOPSY:  - Metastatic adenocarcinoma, see comment.   COMMENT:   The tumor has malignant cells with cleared cytoplasm.  Immunohistochemistry is positive for cytokeratin 7, PAX8, racemase, and  NapsinA. The cells are negative for ER, WT-1, and CD10. The morphology  and immunophenotype are consistent with metastatic clear cell carcinoma  of gynecologic origin (by definition  high grade). Dr. Benay Spice was  notified on 01/08/20.    02/06/2020 Surgery   Exlap with resection of pelvic tumor densely adherent to the anterior aspect of the pubic arch. Surgery at [facility]  nodular mass palpable, fixed to the pubic arch on exam. On dissection into the mons, an irregularly shaped and nodular tumor was implanted on the pubic arch. No evidence of disease on visualized bowel or omentum. The palpated anterior abdominal wall was free of disease. The bladder dome peritoneum was free of disease. Few soft nodules on the anterior wall peritoneum were ablated using the bovie. No palpable tumor on or surrounding the pubic arch following resection.   02/06/2020 Pathology Results   A: "Pelvic cavity, pubic mons tumor, excision - Clear cell carcinoma, received fragmented, 5.5 cm in aggregate (see comment) - Carcinoma involves multiple cauterized tissue edges  ER negative, PR 1+ (20%)   Adenocarcinoma of abdominal wall, unclear primary  08/09/2013 Initial Diagnosis   Adenocarcinoma of abdominal wall, unclear primary   08/16/2013 - 09/25/2013 Chemotherapy   paclitaxel and carboplatin   11/05/2013 Surgery   resection abdominal wall mass    - 02/27/2014 Radiation Therapy   Completed volume directed radiation   01/06/2020 Relapse/Recurrence   Anterior abdominal wall mass biopsy: A. SOFT TISSUE MASS, PELVIC BODY WALL, NEEDLE CORE BIOPSY:  - Metastatic adenocarcinoma, see comment.   COMMENT:   The tumor has malignant cells with cleared cytoplasm.  Immunohistochemistry is positive for cytokeratin 7, PAX8,  racemase, and  NapsinA. The cells are negative for ER, WT-1, and CD10. The morphology  and immunophenotype are consistent with metastatic clear cell carcinoma  of gynecologic origin (by definition  high grade). Dr. Benay Spice was  notified on 01/08/20.    02/06/2020 Surgery   Exlap with resection of pelvic tumor densely adherent to the anterior aspect of the pubic arch. Surgery at [facility]  nodular mass palpable, fixed to the pubic arch on exam. On dissection into the mons, an irregularly shaped and nodular tumor was implanted on the pubic arch. No evidence of disease on visualized bowel or omentum. The palpated anterior abdominal wall was free of disease. The bladder dome peritoneum was free of disease. Few soft nodules on the anterior wall peritoneum were ablated using the bovie. No palpable tumor on or surrounding the pubic arch following resection.   02/06/2020 Pathology Results   A: "Pelvic cavity, pubic mons tumor, excision - Clear cell carcinoma, received fragmented, 5.5 cm in aggregate (see comment) - Carcinoma involves multiple cauterized tissue edges  ER negative, PR 1+ (20%)   Malignant neoplasm of ovary ([place])  05/03/2018 Initial Diagnosis   Malignant neoplasm of ovary ([place])   05/21/2018 -  Chemotherapy   The patient had palonosetron (ALOXI) injection 0.25 mg, 0.25 mg, Intravenous,  Once, 6 of 6 cycles Administration: 0.25 mg (05/21/2018), 0.25 mg (07/27/2018), 0.25 mg (06/11/2018), 0.25 mg (07/06/2018), 0.25 mg (08/17/2018), 0.25 mg (09/07/2018) pegfilgrastim-cbqv (UDENYCA) injection 6 mg, 6 mg, Subcutaneous, Once, 3 of 3 cycles Administration: 6 mg (07/30/2018), 6 mg (09/08/2018) CARBOplatin (PARAPLATIN) 310 mg in sodium chloride 0.9 % 250 mL chemo infusion, 310 mg (  100 % of original dose 306.4 mg), Intravenous,  Once, 6 of 6 cycles Dose modification:   (original dose 306.4 mg, Cycle 1),   (original dose 306.4 mg, Cycle 4) Administration: 310 mg (05/21/2018), 300 mg (07/27/2018), 300 mg (06/11/2018),  300 mg (07/06/2018), 300 mg (08/17/2018), 300 mg (09/07/2018) gemcitabine (GEMZAR) 1,368 mg in sodium chloride 0.9 % 250 mL chemo infusion, 800 mg/m2 = 1,368 mg (100 % of original dose 800 mg/m2), Intravenous,  Once, 6 of 6 cycles Dose modification: 800 mg/m2 (original dose 800 mg/m2, Cycle 1, Reason: Provider Judgment) Administration: 1,368 mg (05/21/2018), 1,368 mg (07/27/2018), 1,368 mg (06/11/2018), 1,368 mg (07/06/2018), 1,368 mg (08/17/2018), 1,368 mg (09/07/2018)  for chemotherapy treatment.      Interval History: Patient presents today for an incision check.  Overall, she endorses feeling better since I saw her last.  She has occasional discomfort related to her incision.  She started IMRT on 11/10 with Dr. Lisbeth Renshaw. Anticipate treating to 54 Gy in 30 fractions.  She denies any vaginal bleeding or discharge.  She reports regular bowel and bladder function.  Past Medical/Surgical History: Past Medical History:  Diagnosis Date  . Anxiety    new dx  . Atrial fibrillation ([place])   . Cancer ([place]) 1988, 2015   ovarian, adenocarcinoma  . GERD (gastroesophageal reflux disease)    hx of years ago  . Hx of radiation therapy 01/16/14-02/27/14   abdomen   . Hypercholesteremia    under control with diet and fish oil  . Hypertension   . Ovarian cyst   . PONV (postoperative nausea and vomiting)   . Restless leg syndrome   . Thyroid disease     Past Surgical History:  Procedure Laterality Date  . APPENDECTOMY    . APPLICATION OF A-CELL OF CHEST/ABDOMEN N/A 11/05/2013   Procedure: ABDOMINAL WALL RESCONTRUCTION WITH STRATUS;  Surgeon: Irene Limbo, MD;  Location: [facility];  Service: Plastics;  Laterality: N/A;  . CHOLECYSTECTOMY  2009  . LAPAROTOMY N/A 11/05/2013   Procedure: RESECTION OF PELVIC MASS;  Surgeon: Imagene Gurney A. Alycia Rossetti, MD;  Location: [facility];  Service: Gynecology;  Laterality: N/A;  . LAPAROTOMY  03/2018   excision of suprapubic mass at [facility]  . OOPHORECTOMY     BSO  . TONSILLECTOMY  as child   . TOTAL ABDOMINAL HYSTERECTOMY  1988   ovarian cyst  BSO    Family History  Problem Relation Age of Onset  . Heart disease Mother   . Heart disease Sister   . Diabetes Sister   . Cancer Sister        melanoma-skin cancer  . Breast cancer Paternal Grandmother        Age 26's  . Cancer Paternal Grandmother        breast  . Cancer Paternal Grandfather        prostate/bladder    Social History   Socioeconomic History  . Marital status: Single    Spouse name: Not on file  . Number of children: Not on file  . Years of education: Not on file  . Highest education level: Not on file  Occupational History  . Not on file  Tobacco Use  . Smoking status: Never Smoker  . Smokeless tobacco: Never Used  Vaping Use  . Vaping Use: Never used  Substance and Sexual Activity  . Alcohol use: No    Alcohol/week: 0.0 standard drinks  . Drug use: No  . Sexual activity: Never    Birth control/protection: Surgical  Comment: HYST  Other Topics Concern  . 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 Difficulty of Paying Living Expenses: Not on file  Food Insecurity:   . Worried About Charity fundraiser in the Last Year: Not on file  . Ran Out of Food in the Last Year: Not on file  Transportation Needs:   . Lack of Transportation (Medical): Not on file  . Lack of Transportation (Non-Medical): Not on file  Physical Activity:   . Days of Exercise per Week: Not on file  . Minutes of Exercise per Session: Not on file  Stress:   . Feeling of Stress : Not on file  Social Connections:   . Frequency of Communication with Friends and Family: Not on file  . Frequency of Social Gatherings with Friends and Family: Not on file  . Attends Religious  Services: Not on file  . Active Member of Clubs or Organizations: Not on file  . Attends Archivist Meetings: Not on file  . Marital Status: Not on file    Current Medications:  Current Outpatient Medications:  .  ALPRAZolam (XANAX) 0.25 MG tablet, Take 0.25-0.5 mg by mouth at bedtime. , Disp: , Rfl:  .  apixaban (ELIQUIS) 5 MG TABS tablet, TAKE (1) TABLET BY MOUTH TWICE DAILY., Disp: 28 tablet, Rfl: 0 .  cholecalciferol (VITAMIN D3) 25 MCG (1000 UNIT) tablet, Take 1,000 Units by mouth daily., Disp: , Rfl:  .  estrogens, conjugated, (PREMARIN) 0.625 MG tablet, Take 1 tablet (0.625 mg total) by mouth every Monday., Disp: 30 tablet, Rfl: 2 .  HYDROcodone-acetaminophen (NORCO/VICODIN) 5-325 MG tablet, Take 0.5-1 tablets by mouth at bedtime. , Disp: , Rfl:  .  levothyroxine (SYNTHROID, LEVOTHROID) 75 MCG tablet, Take 75 mcg by mouth every morning. , Disp: , Rfl:  .  metoprolol tartrate (LOPRESSOR) 25 MG tablet, TAKE (1/2) TABLET BY MOUTH TWICE DAILY. (Patient taking differently: Take 12.5 mg by mouth 2 (two) times daily. ), Disp: 90 tablet, Rfl: 3 .  Multiple Vitamin (MULTIVITAMIN WITH MINERALS) TABS tablet, Take 1 tablet by mouth daily., Disp: , Rfl:  .  rOPINIRole (REQUIP) 0.25 MG tablet, Take 0.25-0.5 mg by mouth at bedtime. , Disp: , Rfl:  .  Omega-3 Fatty Acids (FISH OIL) 1200 MG CAPS, Take 1,200 mg by mouth daily. (Patient not taking: Reported on 03/11/2020), Disp: , Rfl:  No current facility-administered medications for this visit.  Facility-Administered Medications Ordered in Other Visits:  .  0.9 %  sodium chloride infusion, , Intravenous, Once, [name] B, MD .  0.9 %  sodium chloride infusion, , Intravenous, Once, Ladell Pier, MD  Review of Systems: Denies appetite changes, fevers, chills, fatigue, unexplained weight changes. Denies hearing loss, neck lumps or masses, mouth sores, ringing in ears or voice changes. Denies cough or wheezing.  Denies shortness of  breath. Denies chest pain or palpitations. Denies leg swelling. Denies abdominal distention, pain, blood in stools, constipation, diarrhea, nausea, vomiting, or early satiety. Denies pain with intercourse, dysuria, frequency, hematuria or incontinence. Denies hot flashes, pelvic pain, vaginal bleeding or vaginal discharge.   Denies joint pain, back pain or muscle pain/cramps. Denies itching, rash, or wounds. Denies dizziness, headaches,  numbness or seizures. Denies swollen lymph nodes or glands, denies easy bruising or bleeding. Denies anxiety, depression, confusion, or decreased concentration.  Physical Exam: BP 101/61 (BP Location: Left Arm, Patient Position: Sitting)   Pulse 64   Temp 97.6 F (36.4 C) (Tympanic)   Resp 18   Wt 144 lb 6.4 oz (65.5 kg)   SpO2 100%   BMI 22.96 kg/m  General: Alert, oriented, no acute distress. HEENT: Atraumatic, normocephalic, sclera anicteric. Chest: Unlabored breathing on room air. Abdomen: soft, nontender.  Normoactive bowel sounds.  No masses or hepatosplenomegaly appreciated.  Well-healed Pfannenstiel incision and horizontal suprapubic incision.  More recent surgical incision with what appears to be the tail of the fascial suture visible along the midportion of the incision.  Continues to be some fullness inferior and superior to the incision that is somewhat less than previous. GU: Almost complete resolution of the left labial swelling, moderate edema extending down the right labia with some mild discoloration of this labia.  On palpation, this is nontender and not fluctuant.  Laboratory & Radiologic Studies: None new  Assessment & Plan: [name] is a 74 y.o. 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  Overall, patient continues to do well after surgery.  We discussed her exam.  I still think she may  have developed a postoperative seroma or hematoma that is slowly resolving.  She initially had bilateral labial swelling, this now is almost solely on the right.  I reached out to her radiation oncologist to assure that no small bowel had been visualized on her simulation CT scan.  Given the extension of the tumor and attachment to the mons, the superior aspect of the field is in close proximity to the small bowel.  It is not possible to say whether there is any fascial defect above the level of the mons based on her CT simulation.  I had this discussion with the radiation oncologist after the patient had left clinic, and I let her know that I would call her with this information.  I think it would be reasonable to move forward with a CT scan at this time or wait until she completes radiation therapy.  If we wait, then this will serve to both assess for a hernia and also as her post treatment completion scan.  28 minutes of total time was spent for this patient encounter, including preparation, face-to-face counseling with the patient and coordination of care, and documentation of the encounter.  Jeral Pinch, MD  Division of Gynecologic Oncology  Department of Obstetrics and Gynecology  [facility]

## 2020-03-27 ENCOUNTER — Ambulatory Visit
Admission: RE | Admit: 2020-03-27 | Discharge: 2020-03-27 | Disposition: A | Discharge status: Home / Self Care | Payer: Medicare Other | Source: Ambulatory Visit | Attending: Radiation Oncology | Admitting: Radiation Oncology

## 2020-03-27 ENCOUNTER — Inpatient Hospital Stay: Payer: Medicare Other | Attending: Gynecologic Oncology | Admitting: Gynecologic Oncology

## 2020-03-27 ENCOUNTER — Other Ambulatory Visit: Payer: Self-pay

## 2020-03-27 VITALS — BP 101/61 | HR 64 | Temp 97.6°F | Resp 18 | Wt 144.4 lb

## 2020-03-27 DIAGNOSIS — Z9221 Personal history of antineoplastic chemotherapy: Secondary | ICD-10-CM | POA: Diagnosis not present

## 2020-03-27 DIAGNOSIS — Z923 Personal history of irradiation: Secondary | ICD-10-CM | POA: Diagnosis not present

## 2020-03-27 DIAGNOSIS — C7989 Secondary malignant neoplasm of other specified sites: Secondary | ICD-10-CM | POA: Insufficient documentation

## 2020-03-27 DIAGNOSIS — Z90722 Acquired absence of ovaries, bilateral: Secondary | ICD-10-CM | POA: Diagnosis not present

## 2020-03-27 DIAGNOSIS — Z7901 Long term (current) use of anticoagulants: Secondary | ICD-10-CM | POA: Diagnosis not present

## 2020-03-27 DIAGNOSIS — F419 Anxiety disorder, unspecified: Secondary | ICD-10-CM | POA: Insufficient documentation

## 2020-03-27 DIAGNOSIS — Z79899 Other long term (current) drug therapy: Secondary | ICD-10-CM | POA: Diagnosis not present

## 2020-03-27 DIAGNOSIS — C801 Malignant (primary) neoplasm, unspecified: Secondary | ICD-10-CM | POA: Diagnosis not present

## 2020-03-27 DIAGNOSIS — Z90710 Acquired absence of both cervix and uterus: Secondary | ICD-10-CM | POA: Insufficient documentation

## 2020-03-27 DIAGNOSIS — C494 Malignant neoplasm of connective and soft tissue of abdomen: Secondary | ICD-10-CM | POA: Diagnosis not present

## 2020-03-27 DIAGNOSIS — Z510 Encounter for antineoplastic radiation therapy: Secondary | ICD-10-CM | POA: Diagnosis not present

## 2020-03-27 DIAGNOSIS — C7982 Secondary malignant neoplasm of genital organs: Secondary | ICD-10-CM | POA: Diagnosis not present

## 2020-03-27 NOTE — Patient Instructions (Signed)
Plan to follow up in four months with Dr. Berline Lopes or sooner if needed. You will need to call the office closer to the date to schedule your appointment at [phone]. Please call sooner for any issues or concerns or new symptoms.

## 2020-03-30 ENCOUNTER — Ambulatory Visit
Admission: RE | Admit: 2020-03-30 | Discharge: 2020-03-30 | Disposition: A | Discharge status: Home / Self Care | Payer: Medicare Other | Source: Ambulatory Visit | Attending: Radiation Oncology | Admitting: Radiation Oncology

## 2020-03-30 ENCOUNTER — Other Ambulatory Visit: Payer: Self-pay | Admitting: Gynecologic Oncology

## 2020-03-30 ENCOUNTER — Telehealth: Payer: Self-pay | Admitting: *Deleted

## 2020-03-30 DIAGNOSIS — C577 Malignant neoplasm of other specified female genital organs: Secondary | ICD-10-CM

## 2020-03-30 DIAGNOSIS — C801 Malignant (primary) neoplasm, unspecified: Secondary | ICD-10-CM

## 2020-03-30 DIAGNOSIS — Z510 Encounter for antineoplastic radiation therapy: Secondary | ICD-10-CM | POA: Diagnosis not present

## 2020-03-30 DIAGNOSIS — C7982 Secondary malignant neoplasm of genital organs: Secondary | ICD-10-CM | POA: Diagnosis not present

## 2020-03-30 DIAGNOSIS — C494 Malignant neoplasm of connective and soft tissue of abdomen: Secondary | ICD-10-CM | POA: Diagnosis not present

## 2020-03-30 NOTE — Telephone Encounter (Signed)
Per Dr Berline Lopes, scheduled the patient for a CT scan in January with a lab appt before. Called and left the patient a message to call the office back. Patient needs to be given the CT scan appt for 05/19/2020 at 8am and CT instructions. Patient to pick up instructions and CT contrast at any of her radiation appts. Patient is scheduled for a lab appt on 12/22 after her radiation.

## 2020-03-30 NOTE — Telephone Encounter (Signed)
Patient called and given appts and instructions

## 2020-03-31 ENCOUNTER — Other Ambulatory Visit: Payer: Self-pay

## 2020-03-31 ENCOUNTER — Ambulatory Visit
Admission: RE | Admit: 2020-03-31 | Discharge: 2020-03-31 | Disposition: A | Discharge status: Home / Self Care | Payer: Medicare Other | Source: Ambulatory Visit | Attending: Radiation Oncology | Admitting: Radiation Oncology

## 2020-03-31 DIAGNOSIS — Z510 Encounter for antineoplastic radiation therapy: Secondary | ICD-10-CM | POA: Diagnosis not present

## 2020-03-31 DIAGNOSIS — C494 Malignant neoplasm of connective and soft tissue of abdomen: Secondary | ICD-10-CM | POA: Diagnosis not present

## 2020-03-31 DIAGNOSIS — C7982 Secondary malignant neoplasm of genital organs: Secondary | ICD-10-CM | POA: Diagnosis not present

## 2020-04-01 ENCOUNTER — Ambulatory Visit
Admission: RE | Admit: 2020-04-01 | Discharge: 2020-04-01 | Disposition: A | Discharge status: Home / Self Care | Payer: Medicare Other | Source: Ambulatory Visit | Attending: Radiation Oncology | Admitting: Radiation Oncology

## 2020-04-01 DIAGNOSIS — C7982 Secondary malignant neoplasm of genital organs: Secondary | ICD-10-CM | POA: Diagnosis not present

## 2020-04-01 DIAGNOSIS — Z510 Encounter for antineoplastic radiation therapy: Secondary | ICD-10-CM | POA: Diagnosis not present

## 2020-04-01 DIAGNOSIS — C494 Malignant neoplasm of connective and soft tissue of abdomen: Secondary | ICD-10-CM | POA: Diagnosis not present

## 2020-04-02 ENCOUNTER — Ambulatory Visit
Admission: RE | Admit: 2020-04-02 | Discharge: 2020-04-02 | Disposition: A | Discharge status: Home / Self Care | Payer: Medicare Other | Source: Ambulatory Visit | Attending: Radiation Oncology | Admitting: Radiation Oncology

## 2020-04-02 DIAGNOSIS — Z510 Encounter for antineoplastic radiation therapy: Secondary | ICD-10-CM | POA: Diagnosis not present

## 2020-04-02 DIAGNOSIS — C7982 Secondary malignant neoplasm of genital organs: Secondary | ICD-10-CM | POA: Diagnosis not present

## 2020-04-02 DIAGNOSIS — C494 Malignant neoplasm of connective and soft tissue of abdomen: Secondary | ICD-10-CM | POA: Diagnosis not present

## 2020-04-03 ENCOUNTER — Ambulatory Visit
Admission: RE | Admit: 2020-04-03 | Discharge: 2020-04-03 | Disposition: A | Discharge status: Home / Self Care | Payer: Medicare Other | Source: Ambulatory Visit | Attending: Radiation Oncology | Admitting: Radiation Oncology

## 2020-04-03 DIAGNOSIS — Z510 Encounter for antineoplastic radiation therapy: Secondary | ICD-10-CM | POA: Diagnosis not present

## 2020-04-03 DIAGNOSIS — C7982 Secondary malignant neoplasm of genital organs: Secondary | ICD-10-CM | POA: Diagnosis not present

## 2020-04-03 DIAGNOSIS — C494 Malignant neoplasm of connective and soft tissue of abdomen: Secondary | ICD-10-CM | POA: Diagnosis not present

## 2020-04-05 ENCOUNTER — Other Ambulatory Visit: Payer: Self-pay

## 2020-04-05 ENCOUNTER — Ambulatory Visit
Admission: RE | Admit: 2020-04-05 | Discharge: 2020-04-05 | Disposition: A | Discharge status: Home / Self Care | Payer: Medicare Other | Source: Ambulatory Visit | Attending: Radiation Oncology | Admitting: Radiation Oncology

## 2020-04-05 DIAGNOSIS — C494 Malignant neoplasm of connective and soft tissue of abdomen: Secondary | ICD-10-CM | POA: Diagnosis not present

## 2020-04-05 DIAGNOSIS — C7982 Secondary malignant neoplasm of genital organs: Secondary | ICD-10-CM | POA: Diagnosis not present

## 2020-04-05 DIAGNOSIS — Z510 Encounter for antineoplastic radiation therapy: Secondary | ICD-10-CM | POA: Diagnosis not present

## 2020-04-06 ENCOUNTER — Ambulatory Visit
Admission: RE | Admit: 2020-04-06 | Discharge: 2020-04-06 | Disposition: A | Discharge status: Home / Self Care | Payer: Medicare Other | Source: Ambulatory Visit | Attending: Radiation Oncology | Admitting: Radiation Oncology

## 2020-04-06 ENCOUNTER — Other Ambulatory Visit: Payer: Self-pay

## 2020-04-06 ENCOUNTER — Encounter: Payer: Self-pay | Admitting: Genetic Counselor

## 2020-04-06 DIAGNOSIS — C7982 Secondary malignant neoplasm of genital organs: Secondary | ICD-10-CM | POA: Diagnosis not present

## 2020-04-06 DIAGNOSIS — Z510 Encounter for antineoplastic radiation therapy: Secondary | ICD-10-CM | POA: Diagnosis not present

## 2020-04-06 DIAGNOSIS — C494 Malignant neoplasm of connective and soft tissue of abdomen: Secondary | ICD-10-CM | POA: Diagnosis not present

## 2020-04-06 DIAGNOSIS — Z1379 Encounter for other screening for genetic and chromosomal anomalies: Secondary | ICD-10-CM | POA: Insufficient documentation

## 2020-04-07 ENCOUNTER — Ambulatory Visit
Admission: RE | Admit: 2020-04-07 | Discharge: 2020-04-07 | Disposition: A | Discharge status: Home / Self Care | Payer: Medicare Other | Source: Ambulatory Visit | Attending: Radiation Oncology | Admitting: Radiation Oncology

## 2020-04-07 DIAGNOSIS — C7982 Secondary malignant neoplasm of genital organs: Secondary | ICD-10-CM | POA: Diagnosis not present

## 2020-04-07 DIAGNOSIS — C494 Malignant neoplasm of connective and soft tissue of abdomen: Secondary | ICD-10-CM | POA: Diagnosis not present

## 2020-04-07 DIAGNOSIS — Z510 Encounter for antineoplastic radiation therapy: Secondary | ICD-10-CM | POA: Diagnosis not present

## 2020-04-08 ENCOUNTER — Ambulatory Visit
Admission: RE | Admit: 2020-04-08 | Discharge: 2020-04-08 | Disposition: A | Discharge status: Home / Self Care | Payer: Medicare Other | Source: Ambulatory Visit | Attending: Radiation Oncology | Admitting: Radiation Oncology

## 2020-04-08 DIAGNOSIS — Z510 Encounter for antineoplastic radiation therapy: Secondary | ICD-10-CM | POA: Diagnosis not present

## 2020-04-08 DIAGNOSIS — C7982 Secondary malignant neoplasm of genital organs: Secondary | ICD-10-CM | POA: Diagnosis not present

## 2020-04-08 DIAGNOSIS — C494 Malignant neoplasm of connective and soft tissue of abdomen: Secondary | ICD-10-CM | POA: Diagnosis not present

## 2020-04-12 ENCOUNTER — Ambulatory Visit: Payer: Medicare Other

## 2020-04-13 ENCOUNTER — Ambulatory Visit
Admission: RE | Admit: 2020-04-13 | Discharge: 2020-04-13 | Disposition: A | Discharge status: Home / Self Care | Payer: Medicare Other | Source: Ambulatory Visit | Attending: Radiation Oncology | Admitting: Radiation Oncology

## 2020-04-13 DIAGNOSIS — C7982 Secondary malignant neoplasm of genital organs: Secondary | ICD-10-CM | POA: Diagnosis not present

## 2020-04-13 DIAGNOSIS — C494 Malignant neoplasm of connective and soft tissue of abdomen: Secondary | ICD-10-CM | POA: Diagnosis not present

## 2020-04-13 DIAGNOSIS — Z510 Encounter for antineoplastic radiation therapy: Secondary | ICD-10-CM | POA: Diagnosis not present

## 2020-04-14 ENCOUNTER — Ambulatory Visit
Admission: RE | Admit: 2020-04-14 | Discharge: 2020-04-14 | Disposition: A | Discharge status: Home / Self Care | Payer: Medicare Other | Source: Ambulatory Visit | Attending: Radiation Oncology | Admitting: Radiation Oncology

## 2020-04-14 DIAGNOSIS — I4891 Unspecified atrial fibrillation: Secondary | ICD-10-CM | POA: Diagnosis not present

## 2020-04-14 DIAGNOSIS — Z510 Encounter for antineoplastic radiation therapy: Secondary | ICD-10-CM | POA: Diagnosis not present

## 2020-04-14 DIAGNOSIS — C494 Malignant neoplasm of connective and soft tissue of abdomen: Secondary | ICD-10-CM | POA: Diagnosis not present

## 2020-04-14 DIAGNOSIS — C7982 Secondary malignant neoplasm of genital organs: Secondary | ICD-10-CM | POA: Diagnosis not present

## 2020-04-14 DIAGNOSIS — E7849 Other hyperlipidemia: Secondary | ICD-10-CM | POA: Diagnosis not present

## 2020-04-15 ENCOUNTER — Ambulatory Visit
Admission: RE | Admit: 2020-04-15 | Discharge: 2020-04-15 | Disposition: A | Discharge status: Home / Self Care | Payer: Medicare Other | Source: Ambulatory Visit | Attending: Radiation Oncology | Admitting: Radiation Oncology

## 2020-04-15 DIAGNOSIS — Z510 Encounter for antineoplastic radiation therapy: Secondary | ICD-10-CM | POA: Insufficient documentation

## 2020-04-15 DIAGNOSIS — C494 Malignant neoplasm of connective and soft tissue of abdomen: Secondary | ICD-10-CM | POA: Diagnosis not present

## 2020-04-16 ENCOUNTER — Ambulatory Visit
Admission: RE | Admit: 2020-04-16 | Discharge: 2020-04-16 | Disposition: A | Discharge status: Home / Self Care | Payer: Medicare Other | Source: Ambulatory Visit | Attending: Radiation Oncology | Admitting: Radiation Oncology

## 2020-04-16 DIAGNOSIS — Z510 Encounter for antineoplastic radiation therapy: Secondary | ICD-10-CM | POA: Diagnosis not present

## 2020-04-16 DIAGNOSIS — C494 Malignant neoplasm of connective and soft tissue of abdomen: Secondary | ICD-10-CM | POA: Diagnosis not present

## 2020-04-17 ENCOUNTER — Ambulatory Visit
Admission: RE | Admit: 2020-04-17 | Discharge: 2020-04-17 | Disposition: A | Discharge status: Home / Self Care | Payer: Medicare Other | Source: Ambulatory Visit | Attending: Radiation Oncology | Admitting: Radiation Oncology

## 2020-04-17 DIAGNOSIS — Z510 Encounter for antineoplastic radiation therapy: Secondary | ICD-10-CM | POA: Diagnosis not present

## 2020-04-17 DIAGNOSIS — C494 Malignant neoplasm of connective and soft tissue of abdomen: Secondary | ICD-10-CM | POA: Diagnosis not present

## 2020-04-20 ENCOUNTER — Ambulatory Visit
Admission: RE | Admit: 2020-04-20 | Discharge: 2020-04-20 | Disposition: A | Discharge status: Home / Self Care | Payer: Medicare Other | Source: Ambulatory Visit | Attending: Radiation Oncology | Admitting: Radiation Oncology

## 2020-04-20 DIAGNOSIS — C494 Malignant neoplasm of connective and soft tissue of abdomen: Secondary | ICD-10-CM | POA: Diagnosis not present

## 2020-04-20 DIAGNOSIS — Z510 Encounter for antineoplastic radiation therapy: Secondary | ICD-10-CM | POA: Diagnosis not present

## 2020-04-21 ENCOUNTER — Ambulatory Visit
Admission: RE | Admit: 2020-04-21 | Discharge: 2020-04-21 | Disposition: A | Discharge status: Home / Self Care | Payer: Medicare Other | Source: Ambulatory Visit | Attending: Radiation Oncology | Admitting: Radiation Oncology

## 2020-04-21 DIAGNOSIS — Z510 Encounter for antineoplastic radiation therapy: Secondary | ICD-10-CM | POA: Diagnosis not present

## 2020-04-21 DIAGNOSIS — C494 Malignant neoplasm of connective and soft tissue of abdomen: Secondary | ICD-10-CM | POA: Diagnosis not present

## 2020-04-22 ENCOUNTER — Ambulatory Visit
Admission: RE | Admit: 2020-04-22 | Discharge: 2020-04-22 | Disposition: A | Discharge status: Home / Self Care | Payer: Medicare Other | Source: Ambulatory Visit | Attending: Radiation Oncology | Admitting: Radiation Oncology

## 2020-04-22 ENCOUNTER — Other Ambulatory Visit: Payer: Self-pay

## 2020-04-22 DIAGNOSIS — C494 Malignant neoplasm of connective and soft tissue of abdomen: Secondary | ICD-10-CM | POA: Diagnosis not present

## 2020-04-22 DIAGNOSIS — Z510 Encounter for antineoplastic radiation therapy: Secondary | ICD-10-CM | POA: Diagnosis not present

## 2020-04-23 ENCOUNTER — Ambulatory Visit
Admission: RE | Admit: 2020-04-23 | Discharge: 2020-04-23 | Disposition: A | Discharge status: Home / Self Care | Payer: Medicare Other | Source: Ambulatory Visit | Attending: Radiation Oncology | Admitting: Radiation Oncology

## 2020-04-23 DIAGNOSIS — Z510 Encounter for antineoplastic radiation therapy: Secondary | ICD-10-CM | POA: Diagnosis not present

## 2020-04-23 DIAGNOSIS — C494 Malignant neoplasm of connective and soft tissue of abdomen: Secondary | ICD-10-CM | POA: Diagnosis not present

## 2020-04-24 ENCOUNTER — Ambulatory Visit
Admission: RE | Admit: 2020-04-24 | Discharge: 2020-04-24 | Disposition: A | Discharge status: Home / Self Care | Payer: Medicare Other | Source: Ambulatory Visit | Attending: Radiation Oncology | Admitting: Radiation Oncology

## 2020-04-24 DIAGNOSIS — Z510 Encounter for antineoplastic radiation therapy: Secondary | ICD-10-CM | POA: Diagnosis not present

## 2020-04-24 DIAGNOSIS — C494 Malignant neoplasm of connective and soft tissue of abdomen: Secondary | ICD-10-CM | POA: Diagnosis not present

## 2020-04-27 ENCOUNTER — Ambulatory Visit
Admission: RE | Admit: 2020-04-27 | Discharge: 2020-04-27 | Disposition: A | Discharge status: Home / Self Care | Payer: Medicare Other | Source: Ambulatory Visit | Attending: Radiation Oncology | Admitting: Radiation Oncology

## 2020-04-27 DIAGNOSIS — Z510 Encounter for antineoplastic radiation therapy: Secondary | ICD-10-CM | POA: Diagnosis not present

## 2020-04-27 DIAGNOSIS — C494 Malignant neoplasm of connective and soft tissue of abdomen: Secondary | ICD-10-CM | POA: Diagnosis not present

## 2020-04-28 ENCOUNTER — Ambulatory Visit
Admission: RE | Admit: 2020-04-28 | Discharge: 2020-04-28 | Disposition: A | Discharge status: Home / Self Care | Payer: Medicare Other | Source: Ambulatory Visit | Attending: Radiation Oncology | Admitting: Radiation Oncology

## 2020-04-28 DIAGNOSIS — C494 Malignant neoplasm of connective and soft tissue of abdomen: Secondary | ICD-10-CM | POA: Diagnosis not present

## 2020-04-28 DIAGNOSIS — Z510 Encounter for antineoplastic radiation therapy: Secondary | ICD-10-CM | POA: Diagnosis not present

## 2020-04-29 ENCOUNTER — Ambulatory Visit
Admission: RE | Admit: 2020-04-29 | Discharge: 2020-04-29 | Disposition: A | Discharge status: Home / Self Care | Payer: Medicare Other | Source: Ambulatory Visit | Attending: Radiation Oncology | Admitting: Radiation Oncology

## 2020-04-29 ENCOUNTER — Encounter: Payer: Self-pay | Admitting: Radiation Oncology

## 2020-04-29 ENCOUNTER — Other Ambulatory Visit: Payer: Self-pay

## 2020-04-29 DIAGNOSIS — Z510 Encounter for antineoplastic radiation therapy: Secondary | ICD-10-CM | POA: Diagnosis not present

## 2020-04-29 DIAGNOSIS — C494 Malignant neoplasm of connective and soft tissue of abdomen: Secondary | ICD-10-CM | POA: Diagnosis not present

## 2020-04-30 ENCOUNTER — Ambulatory Visit: Payer: Medicare Other

## 2020-05-01 ENCOUNTER — Ambulatory Visit: Payer: Medicare Other

## 2020-05-04 ENCOUNTER — Ambulatory Visit: Payer: Medicare Other

## 2020-05-05 ENCOUNTER — Ambulatory Visit: Payer: Medicare Other

## 2020-05-06 ENCOUNTER — Other Ambulatory Visit: Payer: Self-pay

## 2020-05-06 ENCOUNTER — Inpatient Hospital Stay: Payer: Medicare Other | Attending: Gynecologic Oncology

## 2020-05-06 ENCOUNTER — Other Ambulatory Visit: Payer: Self-pay | Admitting: Radiation Oncology

## 2020-05-06 ENCOUNTER — Other Ambulatory Visit: Payer: Self-pay | Admitting: *Deleted

## 2020-05-06 ENCOUNTER — Ambulatory Visit: Payer: Medicare Other

## 2020-05-06 DIAGNOSIS — C7989 Secondary malignant neoplasm of other specified sites: Secondary | ICD-10-CM | POA: Diagnosis not present

## 2020-05-06 DIAGNOSIS — Z8544 Personal history of malignant neoplasm of other female genital organs: Secondary | ICD-10-CM | POA: Insufficient documentation

## 2020-05-06 DIAGNOSIS — C801 Malignant (primary) neoplasm, unspecified: Secondary | ICD-10-CM | POA: Diagnosis not present

## 2020-05-06 DIAGNOSIS — C569 Malignant neoplasm of unspecified ovary: Secondary | ICD-10-CM

## 2020-05-06 LAB — CMP (CANCER CENTER ONLY)
ALT: 15 U/L (ref 0–44)
AST: 19 U/L (ref 15–41)
Albumin: 3.8 g/dL (ref 3.5–5.0)
Alkaline Phosphatase: 54 U/L (ref 38–126)
Anion gap: 10 (ref 5–15)
BUN: 15 mg/dL (ref 8–23)
CO2: 27 mmol/L (ref 22–32)
Calcium: 10.3 mg/dL (ref 8.9–10.3)
Chloride: 104 mmol/L (ref 98–111)
Creatinine: 0.79 mg/dL (ref 0.44–1.00)
GFR, Estimated: >60 mL/min (ref >60)
Glucose, Bld: 97 mg/dL (ref 70–99)
Potassium: 4.9 mmol/L (ref 3.5–5.1)
Sodium: 141 mmol/L (ref 135–145)
Total Bilirubin: 0.2 mg/dL — ABNORMAL LOW (ref 0.3–1.2)
Total Protein: 7.2 g/dL (ref 6.5–8.1)

## 2020-05-06 LAB — CBC WITH DIFFERENTIAL (CANCER CENTER ONLY)
Abs Immature Granulocytes: 0.02 10*3/uL (ref 0.00–0.07)
Basophils Absolute: 0 10*3/uL (ref 0.0–0.1)
Basophils Relative: 1 %
Eosinophils Absolute: 0.1 10*3/uL (ref 0.0–0.5)
Eosinophils Relative: 2 %
HCT: 36.1 % (ref 36.0–46.0)
Hemoglobin: 12.3 g/dL (ref 12.0–15.0)
Immature Granulocytes: 0 %
Lymphocytes Relative: 22 %
Lymphs Abs: 1.3 10*3/uL (ref 0.7–4.0)
MCH: 32.6 pg (ref 26.0–34.0)
MCHC: 34.1 g/dL (ref 30.0–36.0)
MCV: 95.8 fL (ref 80.0–100.0)
Monocytes Absolute: 0.7 10*3/uL (ref 0.1–1.0)
Monocytes Relative: 11 %
Neutro Abs: 4 10*3/uL (ref 1.7–7.7)
Neutrophils Relative %: 64 %
Platelet Count: 209 10*3/uL (ref 150–400)
RBC: 3.77 MIL/uL — ABNORMAL LOW (ref 3.87–5.11)
RDW: 12.5 % (ref 11.5–15.5)
WBC Count: 6.2 10*3/uL (ref 4.0–10.5)
nRBC: 0 % (ref 0.0–0.2)

## 2020-05-06 MED ORDER — SILVER SULFADIAZINE 1 % EX CREA: 1.0000 "application " | TOPICAL_CREAM | Freq: Two times a day (BID) | CUTANEOUS | 0 refills | Status: DC

## 2020-05-07 ENCOUNTER — Ambulatory Visit: Payer: Medicare Other

## 2020-05-07 LAB — CA 125: Cancer Antigen (CA) 125: 21.7 U/mL (ref 0.0–38.1)

## 2020-05-15 DIAGNOSIS — I4891 Unspecified atrial fibrillation: Secondary | ICD-10-CM | POA: Diagnosis not present

## 2020-05-15 DIAGNOSIS — E7849 Other hyperlipidemia: Secondary | ICD-10-CM | POA: Diagnosis not present

## 2020-05-19 ENCOUNTER — Telehealth: Payer: Self-pay | Admitting: Gynecologic Oncology

## 2020-05-19 ENCOUNTER — Other Ambulatory Visit: Payer: Self-pay

## 2020-05-19 ENCOUNTER — Ambulatory Visit (HOSPITAL_COMMUNITY)
Admission: RE | Admit: 2020-05-19 | Discharge: 2020-05-19 | Disposition: A | Discharge status: Home / Self Care | Payer: Medicare Other | Source: Ambulatory Visit | Attending: Gynecologic Oncology | Admitting: Gynecologic Oncology

## 2020-05-19 ENCOUNTER — Encounter (HOSPITAL_COMMUNITY): Payer: Self-pay

## 2020-05-19 DIAGNOSIS — C577 Malignant neoplasm of other specified female genital organs: Secondary | ICD-10-CM | POA: Insufficient documentation

## 2020-05-19 DIAGNOSIS — K439 Ventral hernia without obstruction or gangrene: Secondary | ICD-10-CM | POA: Diagnosis not present

## 2020-05-19 DIAGNOSIS — K4020 Bilateral inguinal hernia, without obstruction or gangrene, not specified as recurrent: Secondary | ICD-10-CM | POA: Diagnosis not present

## 2020-05-19 DIAGNOSIS — C801 Malignant (primary) neoplasm, unspecified: Secondary | ICD-10-CM | POA: Diagnosis not present

## 2020-05-19 DIAGNOSIS — C569 Malignant neoplasm of unspecified ovary: Secondary | ICD-10-CM | POA: Diagnosis not present

## 2020-05-19 DIAGNOSIS — K469 Unspecified abdominal hernia without obstruction or gangrene: Secondary | ICD-10-CM | POA: Diagnosis not present

## 2020-05-19 MED ORDER — IOHEXOL 300 MG/ML  SOLN
100.0000 mL | Freq: Once | INTRAMUSCULAR | Status: AC | PRN
  Administered 2020-05-19: 100 mL via INTRAVENOUS

## 2020-05-19 NOTE — Telephone Encounter (Signed)
Called patient to review CT results - called cell phone with busy signal multiple times. Home number rang and then ultimately received a message saying call could not be completed at this time.   Jeral Pinch MD Gynecologic Oncology

## 2020-05-21 ENCOUNTER — Telehealth: Payer: Self-pay | Admitting: Gynecologic Oncology

## 2020-05-21 ENCOUNTER — Encounter: Payer: Self-pay | Admitting: Oncology

## 2020-05-21 NOTE — Telephone Encounter (Signed)
I called the patient again to discuss CT results. No evidence of cancer but there is a hernia vs bilateral hernias - I suspect this is secondary to separation of the fascia near the pubic symphysis during her recovery from surgery. There are loops of small bowel noted anterior to the mons and into the right labia. I recommend that she see one of the general surgeons to discuss risks and benefits of hernia repair. Patient amenable - will place referral for [facility].  Jeral Pinch MD Gynecologic Oncology

## 2020-05-21 NOTE — Progress Notes (Signed)
Faxed referral to Dr. Rosendo Gros to [facility] per Dr. Berline Lopes.

## 2020-06-01 ENCOUNTER — Telehealth: Payer: Self-pay | Admitting: Radiation Oncology

## 2020-06-01 NOTE — Telephone Encounter (Signed)
  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  Interval Since Last Radiation: 5 weeks   03/25/20-04/29/20: The tumor in and involving the pelvis was treated to 45 Gy in 25 fractions.  01/16/2014-02/27/2014: The patient was treated initially to a dose of 45 gray to the target region within the abdominal wall postoperative area. She received 45 gray using a 4 field 3-D conformal technique at 1.8 gray per fraction. The patient then received a boost using a cone down 4 field technique for an additional 9 Gray. The final dose was 103 Gray.  Narrative:  The patient was contacted today for routine follow-up. During treatment she did very well with radiotherapy and did not have significant desquamation. She reports she is doing better with her skin in the abdominal wall and she denies any concerns with bowel or bladder activity though she is meeting with a surgeon next week about hernia repair.  Impression/Plan: 1. 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 The patient has been doing well since completion of radiotherapy. We discussed that we would be happy to continue to follow her as needed, but she will also continue to follow up with Dr. Benay Spice  in medical oncology.      Carola Rhine, PAC

## 2020-06-04 ENCOUNTER — Inpatient Hospital Stay (HOSPITAL_BASED_OUTPATIENT_CLINIC_OR_DEPARTMENT_OTHER): Payer: Medicare Other | Admitting: Oncology

## 2020-06-04 ENCOUNTER — Other Ambulatory Visit: Payer: Self-pay

## 2020-06-04 ENCOUNTER — Inpatient Hospital Stay: Payer: Medicare Other | Attending: Gynecologic Oncology

## 2020-06-04 VITALS — BP 119/68 | HR 69 | Temp 96.6°F | Resp 18 | Ht 66.5 in | Wt 144.4 lb

## 2020-06-04 DIAGNOSIS — I4891 Unspecified atrial fibrillation: Secondary | ICD-10-CM | POA: Insufficient documentation

## 2020-06-04 DIAGNOSIS — Z87440 Personal history of urinary (tract) infections: Secondary | ICD-10-CM | POA: Insufficient documentation

## 2020-06-04 DIAGNOSIS — Z923 Personal history of irradiation: Secondary | ICD-10-CM | POA: Insufficient documentation

## 2020-06-04 DIAGNOSIS — Z9221 Personal history of antineoplastic chemotherapy: Secondary | ICD-10-CM | POA: Insufficient documentation

## 2020-06-04 DIAGNOSIS — K4090 Unilateral inguinal hernia, without obstruction or gangrene, not specified as recurrent: Secondary | ICD-10-CM | POA: Insufficient documentation

## 2020-06-04 DIAGNOSIS — Z8589 Personal history of malignant neoplasm of other organs and systems: Secondary | ICD-10-CM | POA: Diagnosis not present

## 2020-06-04 DIAGNOSIS — G2581 Restless legs syndrome: Secondary | ICD-10-CM | POA: Diagnosis not present

## 2020-06-04 DIAGNOSIS — C569 Malignant neoplasm of unspecified ovary: Secondary | ICD-10-CM | POA: Diagnosis not present

## 2020-06-04 DIAGNOSIS — R971 Elevated cancer antigen 125 [CA 125]: Secondary | ICD-10-CM | POA: Diagnosis not present

## 2020-06-04 NOTE — Progress Notes (Signed)
[facility] OFFICE PROGRESS NOTE   Diagnosis: Ovarian cancer  INTERVAL HISTORY:   [name] returns for a scheduled visit.  She completed adjuvant radiation on 04/29/2020.  She reports tolerating radiation other than skin breakdown in the groin that has healed.  She has persistent hernias in the pubic area.  She recently had transient discomfort in the right abdomen.  No complaint today.  Objective:  Vital signs in last 24 hours:  Blood pressure 119/68, pulse 69, temperature (!) 96.6 F (35.9 C), temperature source Tympanic, resp. rate 18, height 5' 6.5" (1.689 m), weight 144 lb 6.4 oz (65.5 kg), SpO2 100 %.    Lymphatics: No axillary or inguinal nodes Resp: Lungs clear bilaterally Cardio: Regular rate and rhythm GI: No hepatosplenomegaly, no apparent ascites, no mass, low transverse scars without evidence of recurrent tumor, mild erythema surrounding the suprapubic scar.  Reducible bilateral low abdomen hernias Vascular: No leg edema   Lab Results:  Lab Results  Component Value Date   WBC 6.2 05/06/2020   HGB 12.3 05/06/2020   HCT 36.1 05/06/2020   MCV 95.8 05/06/2020   PLT 209 05/06/2020   NEUTROABS 4.0 05/06/2020    CMP  Lab Results  Component Value Date   NA 141 05/06/2020   K 4.9 05/06/2020   CL 104 05/06/2020   CO2 27 05/06/2020   GLUCOSE 97 05/06/2020   BUN 15 05/06/2020   CREATININE 0.79 05/06/2020   CALCIUM 10.3 05/06/2020   PROT 7.2 05/06/2020   ALBUMIN 3.8 05/06/2020   AST 19 05/06/2020   ALT 15 05/06/2020   ALKPHOS 54 05/06/2020   BILITOT 0.2 (L) 05/06/2020   GFRNONAA >60 05/06/2020   GFRAA >60 12/25/2019      Imaging: CT images from 05/19/2020 reviewed  Medications: I have reviewed the patient's current medications.   Assessment/Plan: 1.  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    Elevated CA 125.   Cycle 1 Taxol/carboplatin 08/16/2013.   Cycle 2 Taxol/carboplatin 09/06/2013.   Cycle 3 Taxol/carboplatin 09/27/2013   CT 10/11/2013 with a stable abdominal wall mass.  Status post resection of abdominal wall mass with abdominal wall reconstruction 11/05/2013. Final pathology showed adenocarcinoma. Specimen extensively involved with adenocarcinoma which focally involved the edge of the specimen. Immunostains and Foundation 1 testing were nonspecific for a primary tumor location.  Adjuvant radiation to the abdominal wall 01/16/2014 through 02/27/2014  CT 01/15/2018 while in the emergency room for evaluation of back pain-midline abdominal wall mass above the pubic symphysis and adjacent to the bladder  Ultrasound-guided biopsy of the abdominal wall mass 01/25/2018-adenocarcinoma similar to the 2015 biopsy  PET scan 03/01/2018-hypermetabolism associated with a cystic and solid suprapubic mass. Focal uptake identified in the left L3-4 facets without underlying bony lesion. No other sites of unexpected or suspicious hypermetabolism in the neck, chest, abdomen or pelvis.  Exploratory laparotomy, resection of abdominal wall tumor, oversew of bladder peritoneum, cystoscopy 03/28/2018(findings included a cystic and solid mass encountered below the lower edge of the incision extending into the space ofretziusand adherent to the pubic arch inferiorly, bladder dome posteriorly and prior abdominal wall repair anteriorly. No nodularity palpated in the tumor bed. Visualized bowel and omentum free of disease. Pelvic peritoneum appeared normal). A "small "amount of tumor on the periosteum was cauterized using a Bovie.  Pathology on pelvic tumor-metastatic high-grade adenocarcinoma, favor clear-cell   carcinoma of gynecologic origin, fragments up to 5.8 cm in size; ER negative, PR positive, 1+ (weak staining) to focally 2+, 30-40%, MSI-stable, mutation  burden-4,CREBBPalteration  Cycle 1 gemcitabine/carboplatin 05/21/2018; day 8 held due to neutropenia  Cycle2 gemcitabine/carboplatin 06/11/2018 (day 8 gemcitabine eliminated)  Cycle 3 gemcitabine/carboplatin 07/06/2018  Cycle 4 gemcitabine/carboplatin 07/27/2018 (Udenyca added)  Cycle 5 gemcitabine/carboplatin 08/17/2018  Cycle 6 gemcitabine/carboplatin 09/07/2018   CT 10/19/2018- low anterior abdominal wall mass no longer identified, soft tissue thickening cephalad to the site of prior mass-nonspecific, no additional evidence of metastatic disease  CT 04/18/2019-no evidence of recurrent or metastatic disease  CT 12/23/2019-slight increase in size of ventral midline soft tissue mass near the pubis, no other evidence of disease progression  Biopsy pelvic body wall soft tissue mass 01/06/2020-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  Surgical excision of the soft tissue mass adherent to the pubis 02/06/2020-clear cell carcinoma, positive surgical margins  Adjuvant radiation 03/25/2020- 04/29/2020  CT abdomen/pelvis 05/19/2020- resolution of suprapubic abdominal wall mass, no evidence of metastatic disease, moderate right and small left hernias 2. Ovarian cancer in 1988, low-grade adenocarcinoma with areas of "borderline" carcinoma, status post a hysterectomy and bilateral oophorectomy. 3. New onset atrial fibrillation August 2016, maintained on eliquis 4. Recurrent urinary tract infections 5. Restless legs    Disposition: [name] is in clinical remission from the gynecologic tumor.  She completed a course of adjuvant radiation last month after undergoing excision of the recurrent soft tissue mass in September.  We will follow-up on the CA125 from today.  She is scheduled to see Dr. Berline Lopes in March.  [name] will return for an office visit in 6 months. She will see Dr. Rosendo Gros to discuss management of the low abdominal hernias. [name], MD  06/04/2020  10:35 AM

## 2020-06-05 LAB — CA 125: Cancer Antigen (CA) 125: 20.5 U/mL (ref 0.0–38.1)

## 2020-06-09 ENCOUNTER — Telehealth: Payer: Self-pay | Admitting: *Deleted

## 2020-06-09 NOTE — Telephone Encounter (Signed)
Called to request CA 125 results. Stable results provided.

## 2020-06-13 DIAGNOSIS — E7849 Other hyperlipidemia: Secondary | ICD-10-CM | POA: Diagnosis not present

## 2020-06-13 DIAGNOSIS — I4891 Unspecified atrial fibrillation: Secondary | ICD-10-CM | POA: Diagnosis not present

## 2020-06-14 NOTE — Progress Notes (Signed)
  Radiation Oncology         [phone] ________________________________  Name: [name] MRN: [id number]  Date: 04/29/2020  DOB: [birth year]  End of Treatment Note  Diagnosis:   Metastatic adenocarcinoma     Indication for treatment::  palliative       Radiation treatment dates:   03/25/20 - 04/29/20  Site/dose:   The low anterior abdomen was initially planned to receive 54 Gy in 30 fractions using a 3-field IMRT technique. Because of anatomy change/ bowel in the treatment field, this was discontinued after 45 Gy in 25 fractions.   Narrative: The patient tolerated radiation treatment relatively well.   She had expected skin irritation in the treatment area.    Plan: The patient has completed radiation treatment. The patient will return to radiation oncology clinic for routine followup in one month. I advised the patient to call or return sooner if they have any questions or concerns related to their recovery or treatment. ________________________________  [name], M.D., Ph.D.

## 2020-06-17 DIAGNOSIS — C482 Malignant neoplasm of peritoneum, unspecified: Secondary | ICD-10-CM | POA: Diagnosis not present

## 2020-06-17 DIAGNOSIS — Z6823 Body mass index (BMI) 23.0-23.9, adult: Secondary | ICD-10-CM | POA: Diagnosis not present

## 2020-06-17 DIAGNOSIS — Z1331 Encounter for screening for depression: Secondary | ICD-10-CM | POA: Diagnosis not present

## 2020-06-17 DIAGNOSIS — G894 Chronic pain syndrome: Secondary | ICD-10-CM | POA: Diagnosis not present

## 2020-06-18 DIAGNOSIS — K432 Incisional hernia without obstruction or gangrene: Secondary | ICD-10-CM | POA: Diagnosis not present

## 2020-06-19 ENCOUNTER — Other Ambulatory Visit: Payer: Self-pay | Admitting: Cardiology

## 2020-06-23 ENCOUNTER — Other Ambulatory Visit: Payer: Self-pay | Admitting: Cardiology

## 2020-06-25 ENCOUNTER — Telehealth: Payer: Self-pay | Admitting: *Deleted

## 2020-06-25 NOTE — Telephone Encounter (Signed)
Returned the patient's call and left a message with the address and phone number for the clinic

## 2020-07-06 DIAGNOSIS — B370 Candidal stomatitis: Secondary | ICD-10-CM | POA: Diagnosis not present

## 2020-07-09 DIAGNOSIS — E039 Hypothyroidism, unspecified: Secondary | ICD-10-CM | POA: Diagnosis not present

## 2020-07-14 DIAGNOSIS — B370 Candidal stomatitis: Secondary | ICD-10-CM | POA: Diagnosis not present

## 2020-07-15 DIAGNOSIS — B370 Candidal stomatitis: Secondary | ICD-10-CM | POA: Diagnosis not present

## 2020-07-15 DIAGNOSIS — E039 Hypothyroidism, unspecified: Secondary | ICD-10-CM | POA: Diagnosis not present

## 2020-07-21 DIAGNOSIS — K141 Geographic tongue: Secondary | ICD-10-CM | POA: Diagnosis not present

## 2020-07-28 ENCOUNTER — Encounter: Payer: Self-pay | Admitting: Gynecologic Oncology

## 2020-07-28 ENCOUNTER — Inpatient Hospital Stay: Payer: Medicare Other | Attending: Gynecologic Oncology | Admitting: Gynecologic Oncology

## 2020-07-28 ENCOUNTER — Other Ambulatory Visit: Payer: Self-pay

## 2020-07-28 VITALS — BP 101/52 | HR 64 | Temp 97.9°F | Resp 18 | Wt 142.0 lb

## 2020-07-28 DIAGNOSIS — I10 Essential (primary) hypertension: Secondary | ICD-10-CM | POA: Diagnosis not present

## 2020-07-28 DIAGNOSIS — Z7901 Long term (current) use of anticoagulants: Secondary | ICD-10-CM | POA: Diagnosis not present

## 2020-07-28 DIAGNOSIS — F419 Anxiety disorder, unspecified: Secondary | ICD-10-CM | POA: Insufficient documentation

## 2020-07-28 DIAGNOSIS — Z79899 Other long term (current) drug therapy: Secondary | ICD-10-CM | POA: Insufficient documentation

## 2020-07-28 DIAGNOSIS — I4891 Unspecified atrial fibrillation: Secondary | ICD-10-CM | POA: Diagnosis not present

## 2020-07-28 DIAGNOSIS — Z90710 Acquired absence of both cervix and uterus: Secondary | ICD-10-CM | POA: Diagnosis not present

## 2020-07-28 DIAGNOSIS — E7800 Pure hypercholesterolemia, unspecified: Secondary | ICD-10-CM | POA: Insufficient documentation

## 2020-07-28 DIAGNOSIS — C801 Malignant (primary) neoplasm, unspecified: Secondary | ICD-10-CM

## 2020-07-28 DIAGNOSIS — Z9221 Personal history of antineoplastic chemotherapy: Secondary | ICD-10-CM | POA: Insufficient documentation

## 2020-07-28 DIAGNOSIS — Z923 Personal history of irradiation: Secondary | ICD-10-CM | POA: Insufficient documentation

## 2020-07-28 DIAGNOSIS — Z8540 Personal history of malignant neoplasm of unspecified female genital organ: Secondary | ICD-10-CM | POA: Insufficient documentation

## 2020-07-28 DIAGNOSIS — Z90722 Acquired absence of ovaries, bilateral: Secondary | ICD-10-CM | POA: Diagnosis not present

## 2020-07-28 MED ORDER — ESTROGENS, CONJUGATED 0.625 MG/GM VA CREA: 1.0000 | TOPICAL_CREAM | VAGINAL | 3 refills | Status: DC

## 2020-07-28 NOTE — Patient Instructions (Signed)
It was great to see you today.  I will plan to see you in 3 months to take a look at the irritated area on your vagina after a few months of using the topical estrogen cream. If things have not improved, I will plan to biopsy then.   We will get your CT ordered and scheduled at your visit in June with plan to get this done in July or August.

## 2020-07-28 NOTE — Progress Notes (Signed)
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 Pathology report says "focal well differentiated serous cystadenocarcinoma". The comment is that the adenocarcinomatous component is limited to rare foci of small invasive glandular structures. Another report states that there is a small focus of papillary adenocarcinoma in a serous cystadenofibroma of the left ovary. Uterus, cervix, left fallopian tube, right adnexa and pelvic washings all without evidence of malignancy. Appendix also removed at the time of surgery.  Her CA-125 at that time was reported to be 33. She did not receive adjuvant therapy for her initial cancer diagnosis.  She presented in 07/2013 with abdominal pain and mass was noted along right inferior rectus muscle. Biopsy of the mass showed adenocarcinoma of unknown primary. She received 3 cycles of neoadjuvant chemotherapy (no response), carboplatin/taxol. After resection, she underwent RT due to positie tumor margin. This was completed in 02/2014.  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 Lower abdominal/suprapubic ventral wall lobulated soft tissue  mass, arising within the lower rectus musculature more so on the  right. 5.5 x 7.9 x 9.7 cm. Favor sarcoma or other connective tissue  tumor. This should be amenable to percutaneous biopsy. In a younger  female patient, endometriosis within a Cesarean section scar would  also be a consideration for  this appearance.  2. Narrow fat plane between the mass in #1 and underlying small  bowel and bladder. No lymphadenopathy. No ascites.  3. Surgically absent gallbladder, uterus, and adnexa. Diverticulosis  of the colon.  Study discussed by telephone with PA BENJAMIN MANN on 07/19/2013 at  11:35 .    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 Microscopic Comment Needle core biopsies demonstrate diffuse involvement by well differentiated adenocarcinoma. The adenocarcinoma has the following immunophenotype: Cytokeratin 7 - strong diffuse expression Cytokeratin 20 - negative expression CDX2 - negative expression TTF-1 - negative expression Estrogen receptor - negative expression Vimentin - patchy mild to moderate expression   08/16/2013 - 09/27/2013 Chemotherapy   3 cycle of carboplatin, paclitaxel    10/11/2013 Imaging   CT A/P: A heterogeneous mass centered in the inferior right rectus abdominus  muscle measures 5.3 x 7.5 cm (previously 5.5 x 7.9 cm). No  pathologically enlarged lymph nodes. Scattered atherosclerotic  calcification of the arterial vasculature without abdominal aortic  aneurysm. No free fluid. No worrisome lytic or sclerotic lesions.  Degenerative changes are seen in the spine.   IMPRESSION:  Right rectus abdominus mass is stable to very minimally smaller than  on 07/19/2013.   11/05/2013 Surgery   Resection abdominal wall mass, abdominal wall reconstruction with Strattice matrix (10x11cm)  Operative findings: 8 involving the subcutaneous tissues, fascia, and rectus on the right side. No obvious intraperitoneal disease. Normal-appearing omentum   11/05/2013 Pathology Results   Soft tissue mass, simple excision, abdominal wall mass ADENOCARCINOMA. Microscopic Comment The specimen is  extensively involved  by adenocarcinoma which focally involves the edge of the specimen. Immunohistochemistry is performed and the tumor shows patchy positivity with Napsin-A and is negative with WT-1, estrogen receptor, progesterone receptor, gross disease cystic fluid protein, CDX-2, carcinoembryonic antigen, thyroid transcription factor-1, CD10 and CD117. The immunophenotype is nonspecific and additional immunohistochemistry will be performed and reported as an addendum. (JDP:kh 11/07/13) ADDENDUM: Additional immunohistochemistry is performed and the tumor is positive with cytokeratin AE1/AE3 and cytokeratin 7 and is negative with Calretinin, cytokeratin 5/6 and cytokeratin 20. The immunoreactivity is not specific for location of a primary tumor. (JDP:kh 11-08-13)   11/05/2013 Genetic Testing   [facility] testing MYC amplification - equivocal CREBBP [id number]* No FDA approved therapies in patient's tumor type or another tumor type    01/16/2014 - 02/27/2014 Radiation Therapy   45 gray to the target region within the abdominal wall postoperative area. She received 45 gray using a 4 field 3-D conformal technique at 1.8 gray per fraction. The patient then received a boost using a cone down 4 field technique for an additional 9 Gray. The final dose was 54 gray.   01/29/2018 Pathology Results   Soft Tissue Needle Core Biopsy, ant abd wall mass - ADENOCARCINOMA. - SEE MICROSCOPIC DESCRIPTION. Microscopic Comment The core biopsies consist of abundant dense, hyalinized fibrous tissue with scattered glands with cytologic atypia consistent with adenocarcinoma. Immunohistochemistry shows the tumor is positive with cytokeratin AE1/AE3 and cytokeratin 7 and is negative with cytokeratin 5/6, Calretinin, WT-1, estrogen receptor, progesterone receptor and cytokeratin 20. The morphology and immunophenotype are similar to the morphology in the abdominal wall mass excision from 11/07/2013 [phone]).   03/01/2018  Imaging   PET: 1. Hypermetabolism associated with the cystic and solid suprapubic mass compatible with the reported clinical history of adenocarcinoma recurrence. Cystic components in the cranial aspect of the lesion are largely devoid of FDG accumulation with the most hypermetabolic tissue being inferiorly, immediately adjacent to the symphysis pubis. 2. Focal uptake identified in the left L3-4 facets without underlying bony lesion. This is presumed to represent uptake related to degenerative change. 3. No other sites of unexpected or suspicious hypermetabolism in the neck, chest, abdomen, or pelvis   03/02/2018 Imaging   CT A/P: 6.7 cm mass in the lower anterior abdominal wall containing cystic and solid components, suspicious for recurrence of previously resected/treated adenocarcinoma in this region. This abuts and could invade the anterior wall of the bladder.   No evidence for more distant metastatic disease within the abdomen or pelvis.    03/28/2018 Surgery   Exploratory laparotomy, resection abdominal wall tumor, oversew of bladder peritoneum, cystoscopy.  Findings: normal external genitalia, unable to palpate mass on exam. After opening the fascia, a cystic and solid mass was encountered below the lower edge of the incision, extending into the space of retzius and adherent to the pubic arch inferiorly, the bladder dome posteriorly, and prior abdominal wall repair anteriorly. At conclusion of the case, no nodularity was palpated in the tumor bed. Visualized bowel and omentum were free of disease. Pelvic peritoenum appeared normal. Cystoscopy revealed intact bladder mucosa with no visible suture. Bilateral ureteral orfices visualized.   03/28/2018 Pathology Results   Pelvic tumor, excision - Metastatic high grade adenocarcinoma, favor clear cell carcinoma of gynecologic origin, fragments up to 5.8 cm in size - See comment In the prior biopsy specimen [id number], the malignant  glands were positive for AE1/AE3 and CK7, and negative for CK20, CK5/6, calretinin, WT-1, ER, and PR. Additional immunohistochemical studies performed on  block A5 in the current specimen demonstrates that the tumor is positive for Napsin A and PAX-8, and is negative for TTF-1. 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   03/28/2018 Genetic Testing   [facility] No reportable alterations with companion diagnostic claims MS-stable, TMB - 4 Muts/Mb, CREBBP [id number]*   05/21/2018 -  Chemotherapy   The patient had palonosetron (ALOXI) injection 0.25 mg, 0.25 mg, Intravenous,  Once, 6 of 6 cycles Administration: 0.25 mg (05/21/2018), 0.25 mg (07/27/2018), 0.25 mg (06/11/2018), 0.25 mg (07/06/2018), 0.25 mg (08/17/2018), 0.25 mg (09/07/2018) pegfilgrastim-cbqv (UDENYCA) injection 6 mg, 6 mg, Subcutaneous, Once, 3 of 3 cycles Administration: 6 mg (07/30/2018), 6 mg (09/08/2018) CARBOplatin (PARAPLATIN) 310 mg in sodium chloride 0.9 % 250 mL chemo infusion, 310 mg (100 % of original dose 306.4 mg), Intravenous,  Once, 6 of 6 cycles Dose modification:   (original dose 306.4 mg, Cycle 1),   (original dose 306.4 mg, Cycle 4) Administration: 310 mg (05/21/2018), 300 mg (07/27/2018), 300 mg (06/11/2018), 300 mg (07/06/2018), 300 mg (08/17/2018), 300 mg (09/07/2018) gemcitabine (GEMZAR) 1,368 mg in sodium chloride 0.9 % 250 mL chemo infusion, 800 mg/m2 = 1,368 mg (100 % of original dose 800 mg/m2), Intravenous,  Once, 6 of 6 cycles Dose modification: 800 mg/m2 (original dose 800 mg/m2, Cycle 1, Reason: Provider Judgment) Administration: 1,368 mg (05/21/2018), 1,368 mg (07/27/2018), 1,368 mg (06/11/2018), 1,368 mg (07/06/2018), 1,368 mg (08/17/2018), 1,368 mg (09/07/2018)  for chemotherapy  treatment.     Pathology Results   A. SOFT TISSUE MASS, PELVIC BODY WALL, NEEDLE CORE BIOPSY:  - Metastatic adenocarcinoma, see comment.   COMMENT:   The tumor has malignant cells with cleared cytoplasm.  Immunohistochemistry is positive for cytokeratin 7, PAX8, racemase, and NapsinA. The cells are negative for ER, WT-1, and CD10. 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 Dr. Truett Perna was notified on 01/08/20.    10/19/2018 Imaging   CT A/P: 1. Previously noted lower anterior abdominal wall mass is no longer identified, presumably surgically resected. Cephalad to the site of the prior mass within the low anterior abdominal wall there is some soft tissue thickening seen on today's examination (axial image 54 of series 2). This is nonspecific and will serve as a baseline for future follow-up examinations. 2. No signs of metastatic disease elsewhere in the abdomen or pelvis. 3. Colonic diverticulosis without evidence of acute diverticulitis at this time. 4. Aortic atherosclerosis. 5. Additional incidental findings, as above.   04/18/2019 Imaging   CT A/P: 1. No evidence of recurrent or metastatic carcinoma within the abdomen or pelvis. 2. Colonic diverticulosis. No radiographic evidence of diverticulitis.   12/23/2019 Imaging   CT A/P: Within the ventral, midline abdominal wall soft tissue mass is identified anterior to the pubic symphysis measuring 5.2 x 3.3 by 3.4 cm (volume = 31 cm^3), image 59/5 and image 78/2. This is compared with 4.9 x 3.0 by 2.6 (volume = 20)cm IMPRESSION: 1. Stable to slight increase in size of ventral, midline abdominal wall soft tissue mass at the level of the pubic symphysis. 2. No signs of solid organ or nodal metastases. 3. Aortic atherosclerosis.   01/06/2020 Relapse/Recurrence   Anterior abdominal wall mass biopsy: A. SOFT TISSUE MASS, PELVIC BODY WALL,  NEEDLE CORE BIOPSY:  - Metastatic adenocarcinoma, see comment.   COMMENT:   The tumor has malignant cells with cleared cytoplasm.  Immunohistochemistry is positive for cytokeratin 7, PAX8, racemase, and  NapsinA. The cells are negative for ER, WT-1, and CD10. The morphology  and immunophenotype are consistent with metastatic clear cell carcinoma  of gynecologic origin (by definition  high grade). Dr. Benay Spice was  notified on 01/08/20.    02/06/2020 Surgery   Exlap with resection of pelvic tumor densely adherent to the anterior aspect of the pubic arch. Surgery at [facility]  nodular mass palpable, fixed to the pubic arch on exam. On dissection into the mons, an irregularly shaped and nodular tumor was implanted on the pubic arch. No evidence of disease on visualized bowel or omentum. The palpated anterior abdominal wall was free of disease. The bladder dome peritoneum was free of disease. Few soft nodules on the anterior wall peritoneum were ablated using the bovie. No palpable tumor on or surrounding the pubic arch following resection.   02/06/2020 Pathology Results   A: "Pelvic cavity, pubic mons tumor, excision - Clear cell carcinoma, received fragmented, 5.5 cm in aggregate (see comment) - Carcinoma involves multiple cauterized tissue edges  ER negative, PR 1+ (20%)   04/06/2020 Genetic Testing   Negative genetic testing on the TumorNext-[facility]CancerNext testing.  Somatic testing was negative and no germline mutations identified.  The CancerNext gene panel offered by [employer] includes sequencing and rearrangement analysis for the following 34 genes:   APC, ATM, BARD1, BMPR1A, BRCA1, BRCA2, BRIP1, CDH1, CDK4, CDKN2A, CHEK2, DICER1, HOXB13, EPCAM, GREM1, MLH1, MRE11A, MSH2, MSH6, MUTYH, NBN, NF1, PALB2, PMS2, POLD1, POLE, PTEN, RAD50, RAD51C, RAD51D, SMAD4, SMARCA4, STK11, and TP53.  The report date is April 06, 2020.   Adenocarcinoma of abdominal wall, unclear primary  08/09/2013  Initial Diagnosis   Adenocarcinoma of abdominal wall, unclear primary   08/16/2013 - 09/25/2013 Chemotherapy   paclitaxel and carboplatin   11/05/2013 Surgery   resection abdominal wall mass    - 02/27/2014 Radiation Therapy   Completed volume directed radiation   01/06/2020 Relapse/Recurrence   Anterior abdominal wall mass biopsy: A. SOFT TISSUE MASS, PELVIC BODY WALL, NEEDLE CORE BIOPSY:  - Metastatic adenocarcinoma, see comment.   COMMENT:   The tumor has malignant cells with cleared cytoplasm.  Immunohistochemistry is positive for cytokeratin 7, PAX8, racemase, and  NapsinA. The cells are negative for ER, WT-1, and CD10. The morphology  and immunophenotype are consistent with metastatic clear cell carcinoma  of gynecologic origin (by definition  high grade). Dr. Benay Spice was  notified on 01/08/20.    02/06/2020 Surgery   Exlap with resection of pelvic tumor densely adherent to the anterior aspect of the pubic arch. [facility] at [facility]  nodular mass palpable, fixed to the pubic arch on exam. On dissection into the mons, an irregularly shaped and nodular tumor was implanted on the pubic arch. No evidence of disease on visualized bowel or omentum. The palpated anterior abdominal wall was free of disease. The bladder dome peritoneum was free of disease. Few soft nodules on the anterior wall peritoneum were ablated using the bovie. No palpable tumor on or surrounding the pubic arch following resection.   02/06/2020 Pathology Results   A: "Pelvic cavity, pubic mons tumor, excision - Clear cell carcinoma, received fragmented, 5.5 cm in aggregate (see comment) - Carcinoma involves multiple cauterized tissue edges  ER negative, PR 1+ (20%)   Metastatic adenocarcinoma of ovary  05/03/2018 Initial Diagnosis  Malignant neoplasm of ovary ([place])   05/21/2018 -  Chemotherapy   The patient had palonosetron (ALOXI) injection 0.25 mg, 0.25 mg, Intravenous,  Once, 6 of 6 cycles Administration: 0.25 mg  (05/21/2018), 0.25 mg (07/27/2018), 0.25 mg (06/11/2018), 0.25 mg (07/06/2018), 0.25 mg (08/17/2018), 0.25 mg (09/07/2018) pegfilgrastim-cbqv (UDENYCA) injection 6 mg, 6 mg, Subcutaneous, Once, 3 of 3 cycles Administration: 6 mg (07/30/2018), 6 mg (09/08/2018) CARBOplatin (PARAPLATIN) 310 mg in sodium chloride 0.9 % 250 mL chemo infusion, 310 mg (100 % of original dose 306.4 mg), Intravenous,  Once, 6 of 6 cycles Dose modification:   (original dose 306.4 mg, Cycle 1),   (original dose 306.4 mg, Cycle 4) Administration: 310 mg (05/21/2018), 300 mg (07/27/2018), 300 mg (06/11/2018), 300 mg (07/06/2018), 300 mg (08/17/2018), 300 mg (09/07/2018) gemcitabine (GEMZAR) 1,368 mg in sodium chloride 0.9 % 250 mL chemo infusion, 800 mg/m2 = 1,368 mg (100 % of original dose 800 mg/m2), Intravenous,  Once, 6 of 6 cycles Dose modification: 800 mg/m2 (original dose 800 mg/m2, Cycle 1, Reason: Provider Judgment) Administration: 1,368 mg (05/21/2018), 1,368 mg (07/27/2018), 1,368 mg (06/11/2018), 1,368 mg (07/06/2018), 1,368 mg (08/17/2018), 1,368 mg (09/07/2018)  for chemotherapy treatment.      Interval History: The patient reports overall doing well since her last visit with me.  She saw Dr. Rosendo Gros to talk about hernia repair and given her complex cancer history as well as prior radiation, his recommendation was not to proceed with surgery unless she becomes symptomatic.  If this needed to be done, it would likely benefit from being done at [facility].  Patient is aware of the signs and symptoms that would be concerning for bowel obstruction or incarceration and knows to present to the emergency department if she has any of these.  More recently, she developed mouth sores.  She has seen a dermatologist and was diagnosed with a geographical tongue.  She is using a swish and spit solution and will be picking up the gel for the symptoms.  Her pain is overall improving.  She denies any pelvic or abdominal pain.  She reports occasional diarrhea,  otherwise notes normal bowel and bladder function.  She has a good appetite without nausea or vomiting.  She reports no vaginal bleeding or discharge.  Past Medical/Surgical History: Past Medical History:  Diagnosis Date  . Anxiety    new dx  . Atrial fibrillation ([place])   . Cancer ([place]) 1988, 2015   ovarian, adenocarcinoma  . GERD (gastroesophageal reflux disease)    hx of years ago  . Hx of radiation therapy 01/16/14-02/27/14   abdomen   . Hypercholesteremia    under control with diet and fish oil  . Hypertension   . Ovarian cyst   . PONV (postoperative nausea and vomiting)   . Restless leg syndrome   . Thyroid disease     Past Surgical History:  Procedure Laterality Date  . APPENDECTOMY    . APPLICATION OF A-CELL OF CHEST/ABDOMEN N/A 11/05/2013   Procedure: ABDOMINAL WALL RESCONTRUCTION WITH STRATUS;  Surgeon: Irene Limbo, MD;  Location: [facility];  Service: Plastics;  Laterality: N/A;  . CHOLECYSTECTOMY  2009  . LAPAROTOMY N/A 11/05/2013   Procedure: RESECTION OF PELVIC MASS;  Surgeon: Imagene Gurney A. Alycia Rossetti, MD;  Location: [facility];  Service: Gynecology;  Laterality: N/A;  . LAPAROTOMY  03/2018   [facility]ision [facility] suprapubic [facility]  . OOPHORECTOMY     BSO  . TONSILLECTOMY  as child  . TOTAL ABDOMINAL HYSTERECTOMY  1988   ovarian cyst  BSO    Family History  Problem Relation Age of Onset  . Heart disease Mother   . Heart disease Sister   . Diabetes Sister   . Cancer Sister        melanoma-skin cancer  . Breast cancer Paternal Grandmother        Age 23's  . Cancer Paternal Grandmother        breast  . Cancer Paternal Grandfather        prostate/bladder    Social History   Socioeconomic History  . Marital status: Single    Spouse name: Not on file  . Number of children: Not on file  . Years of education: Not on file  . Highest education level: Not on file  Occupational History  . Not on file  Tobacco Use  . Smoking status: Never Smoker  . Smokeless tobacco:  Never Used  Vaping Use  . Vaping Use: Never used  Substance and Sexual Activity  . Alcohol use: No    Alcohol/week: 0.0 standard drinks  . Drug use: No  . Sexual activity: Never    Birth control/protection: Surgical    Comment: HYST  Other Topics Concern  . 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  ALPRAZolam (XANAX) 0.25 MG tablet, Take 0.25-0.5 mg by mouth at bedtime. , Disp: , Rfl:  .  cholecalciferol (VITAMIN D3) 25 MCG (1000 UNIT) tablet, Take 1,000 Units by mouth daily., Disp: , Rfl:  .  [START ON 07/29/2020] conjugated estrogens (PREMARIN) vaginal cream, Place 1 Applicatorful vaginally 3 (three) times a week. Finger tip amount of cream, applied to vulva and outer vagina at night 3 x a week., Disp: 42.5 g, Rfl: 3 .  ELIQUIS 5 MG TABS tablet, TAKE (1) TABLET BY MOUTH TWICE DAILY., Disp: 180 tablet, Rfl: 1 .  HYDROcodone-acetaminophen (NORCO/VICODIN) 5-325 MG tablet, Take 0.5-1 tablets by mouth at bedtime. , Disp: , Rfl:  .  levothyroxine (SYNTHROID) 50 MCG tablet, Take 50 mcg by mouth daily., Disp: , Rfl:  .  magic mouthwash SOLN, Take 5 mLs by mouth 4 (four) times daily as needed for mouth pain., Disp: , Rfl:  .  metoprolol tartrate (LOPRESSOR) 25 MG tablet, TAKE (1/2) TABLET BY MOUTH TWICE DAILY., Disp: 90 tablet, Rfl: 3 .  Multiple Vitamin (MULTIVITAMIN WITH MINERALS) TABS tablet, Take 1 tablet by mouth daily., Disp: , Rfl:  .  Omega-3 Fatty Acids (FISH OIL) 1200 MG CAPS, Take 1,200 mg by mouth daily., Disp: ,  Rfl:  .  rOPINIRole (REQUIP) 0.25 MG tablet, Take 0.25-0.5 mg by mouth at bedtime. , Disp: , Rfl:  No current facility-administered medications for this visit.  Facility-Administered Medications Ordered in Other Visits:  .  0.9 %  sodium chloride infusion, , Intravenous, Once, [name] B, MD .  0.9 %  sodium chloride infusion, , Intravenous, Once, Ladell Pier, MD  Review of Systems: + mouth sores Denies appetite  changes, fevers, chills, fatigue, unexplained weight changes. Denies hearing loss, neck lumps or masses, ringing in ears or voice changes. Denies cough or wheezing.  Denies shortness of breath. Denies chest pain or palpitations. Denies leg swelling. Denies abdominal distention, pain, blood in stools, constipation, diarrhea, nausea, vomiting, or early satiety. Denies pain with intercourse, dysuria, frequency, hematuria or incontinence. Denies hot flashes, pelvic pain, vaginal bleeding or vaginal discharge.   Denies joint pain, back pain or muscle pain/cramps. Denies itching, rash, or wounds. Denies dizziness, headaches, numbness or seizures. Denies swollen lymph nodes or glands, denies easy bruising or bleeding. Denies anxiety, depression, confusion, or decreased concentration.  Physical Exam: BP (!) 101/52 (BP Location: Left Arm, Patient Position: Sitting)   Pulse 64   Temp 97.9 F (36.6 C) (Tympanic)   Resp 18   Wt 142 lb (64.4 kg)   SpO2 100%   BMI 22.58 kg/m  General: Alert, oriented, no acute distress. HEENT: Normocephalic, atraumatic, sclera anicteric. Chest: Unlabored breathing on room air. Abdomen: soft, nontender.  Normoactive bowel sounds.  No masses or hepatosplenomegaly appreciated.  Well-healed scars.  There is some fullness in the suprapubic/mons area that extends down to the right labia.  The right labia is mildly erythematous and significantly larger than the left. Extremities: Grossly normal range of motion.  Warm, well perfused.  No edema  bilaterally. Skin: No rashes or lesions noted. Lymphatics: No cervical, supraclavicular, or inguinal adenopathy. GU: Normal appearing external genitalia without erythema.  There is an approximately 2 x 2 cm area of desquamation at the left apex of the vagina between the labia minora and majora.  This is not painful to the touch.  No atypical vessels or hyperkeratosis noted.  Speculum exam reveals atrophic vaginal mucosa, exam tolerated poorly, no bleeding or discharge noted.  Bimanual exam reveals no vaginal nodularity or masses.  Rectovaginal exam confirms findings.  Laboratory & Radiologic Studies: None new  Assessment & Plan: [name] is a 75 y.o. woman with  recurrent abdominal wall high-grade adenocarcinomaof GYN origin consistent with clear cell carcinoma status post resection for recurrent disease in 2021 treated with adjuvant radiation who is NED on exam today.  Patient is overall doing well today and is NED.  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  These are asymptomatic.  She is aware of the signs and symptoms that would be concerning for obstruction or incarceration.  She has met with a general surgeon and plan is for expectant management unless she were to become symptomatic.  I discussed findings on her vulva today.  She had previously been using intravaginal Premarin once a week.  I suggested we do a several month course of Premarin cream on the vulva.  If this area is persistent on the labia at her follow-up visit in several months, then I am recommending biopsy.  I suspect that this is irritation caused by pressure from the herniation of her small bowel into the right labia.  From a cancer standpoint, CA-125 has not been a great marker for her recently.  She has had mild fluctuations that sometimes have correlated with her disease recurrences.  She had a CA-125 drawn in January.  We  can repeat this at her follow-up visit over the summer.  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  I will plan to order this at her follow-up visit in June with me.  32 minutes of total time was spent for this patient encounter, including preparation, face-to-face counseling with the patient and coordination of care, and documentation of the encounter.  Jeral Pinch, MD  Division of Gynecologic Oncology  Department of Obstetrics and Gynecology  [facility]

## 2020-07-29 ENCOUNTER — Telehealth: Payer: Self-pay | Admitting: *Deleted

## 2020-07-29 ENCOUNTER — Other Ambulatory Visit: Payer: Self-pay | Admitting: Gynecologic Oncology

## 2020-07-29 NOTE — Telephone Encounter (Signed)
Patient called and left a message stating "I saw Dr Berline Lopes yesterday and she sent in a prescription for estrogen/premarin cream. I called the pharmacy and the price is going to be $120. Is there something else she can call in or I can take or the counter?"  Message given to the desk RN and Dr Berline Lopes

## 2020-07-29 NOTE — Telephone Encounter (Signed)
[name] states that she understands that the tube will last three months according to the directions.  The insurance company is charging her 3 co-pays for the medication as a result. Danielle Faeth will go ahead and pay the ~ $126.00. Notified [employer] of above.

## 2020-08-12 DIAGNOSIS — E7849 Other hyperlipidemia: Secondary | ICD-10-CM | POA: Diagnosis not present

## 2020-08-12 DIAGNOSIS — I4891 Unspecified atrial fibrillation: Secondary | ICD-10-CM | POA: Diagnosis not present

## 2020-09-09 DIAGNOSIS — K141 Geographic tongue: Secondary | ICD-10-CM | POA: Diagnosis not present

## 2020-09-09 DIAGNOSIS — D485 Neoplasm of uncertain behavior of skin: Secondary | ICD-10-CM | POA: Diagnosis not present

## 2020-09-12 DIAGNOSIS — E7849 Other hyperlipidemia: Secondary | ICD-10-CM | POA: Diagnosis not present

## 2020-09-12 DIAGNOSIS — I4891 Unspecified atrial fibrillation: Secondary | ICD-10-CM | POA: Diagnosis not present

## 2020-09-14 DIAGNOSIS — G894 Chronic pain syndrome: Secondary | ICD-10-CM | POA: Diagnosis not present

## 2020-09-14 DIAGNOSIS — Z6823 Body mass index (BMI) 23.0-23.9, adult: Secondary | ICD-10-CM | POA: Diagnosis not present

## 2020-09-14 DIAGNOSIS — G2581 Restless legs syndrome: Secondary | ICD-10-CM | POA: Diagnosis not present

## 2020-10-27 ENCOUNTER — Encounter: Payer: Self-pay | Admitting: Gynecologic Oncology

## 2020-10-28 ENCOUNTER — Encounter: Payer: Self-pay | Admitting: Gynecologic Oncology

## 2020-10-28 ENCOUNTER — Inpatient Hospital Stay: Payer: Medicare Other | Attending: Gynecologic Oncology | Admitting: Gynecologic Oncology

## 2020-10-28 ENCOUNTER — Other Ambulatory Visit: Payer: Self-pay

## 2020-10-28 VITALS — BP 107/62 | HR 64 | Temp 97.3°F | Resp 18 | Wt 139.6 lb

## 2020-10-28 DIAGNOSIS — F419 Anxiety disorder, unspecified: Secondary | ICD-10-CM | POA: Diagnosis not present

## 2020-10-28 DIAGNOSIS — Z7901 Long term (current) use of anticoagulants: Secondary | ICD-10-CM | POA: Insufficient documentation

## 2020-10-28 DIAGNOSIS — K219 Gastro-esophageal reflux disease without esophagitis: Secondary | ICD-10-CM | POA: Diagnosis not present

## 2020-10-28 DIAGNOSIS — C801 Malignant (primary) neoplasm, unspecified: Secondary | ICD-10-CM

## 2020-10-28 DIAGNOSIS — Z9221 Personal history of antineoplastic chemotherapy: Secondary | ICD-10-CM | POA: Insufficient documentation

## 2020-10-28 DIAGNOSIS — I4891 Unspecified atrial fibrillation: Secondary | ICD-10-CM | POA: Diagnosis not present

## 2020-10-28 DIAGNOSIS — Z79899 Other long term (current) drug therapy: Secondary | ICD-10-CM | POA: Insufficient documentation

## 2020-10-28 DIAGNOSIS — C578 Malignant neoplasm of overlapping sites of female genital organs: Secondary | ICD-10-CM | POA: Insufficient documentation

## 2020-10-28 DIAGNOSIS — Z923 Personal history of irradiation: Secondary | ICD-10-CM | POA: Diagnosis not present

## 2020-10-28 DIAGNOSIS — I10 Essential (primary) hypertension: Secondary | ICD-10-CM | POA: Diagnosis not present

## 2020-10-28 DIAGNOSIS — E7800 Pure hypercholesterolemia, unspecified: Secondary | ICD-10-CM | POA: Diagnosis not present

## 2020-10-28 NOTE — Progress Notes (Signed)
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 Pathology report says "focal well differentiated serous cystadenocarcinoma". The comment is that the adenocarcinomatous component is limited to rare foci of small invasive glandular structures. Another report states that there is a small focus of papillary adenocarcinoma in a serous cystadenofibroma of the left ovary. Uterus, cervix, left fallopian tube, right adnexa and pelvic washings all without evidence of malignancy. Appendix also removed at the time of surgery.  Her CA-125 at that time was reported to be 33. She did not receive adjuvant therapy for her initial cancer diagnosis.  She presented in 07/2013 with abdominal pain and mass was noted along right inferior rectus muscle. Biopsy of the mass showed adenocarcinoma of unknown primary. She received 3 cycles of neoadjuvant chemotherapy (no response), carboplatin/taxol. After resection, she underwent RT due to positie tumor margin. This was completed in 02/2014.  CA-125: 08/07/13: 128 09/27/13: 147 12/20/13: 12.9 03/28/14: 9 07/31/14: 8 01/2015: 8 07/2015: 17.3 11/11/15: 16.9 09/2016: 16.9 02/2017: 19.4 08/2017: 19.2 01/2018: 19.2 02/2018: 19.5 08/2018: 19.2 01/2019: 18.1 04/2019: 19.5 08/22/19: 18.6 12/23/19: 24.4   Neoplasm of abdominal wall of uncertain behavior  05/19/9700 Imaging   Ct A/P: 1. Lower abdominal/suprapubic ventral wall lobulated soft tissue  mass, arising within the lower rectus musculature more so on the  right. 5.5 x 7.9 x 9.7 cm. Favor sarcoma or other connective tissue  tumor. This should be amenable to percutaneous biopsy. In a younger  female patient, endometriosis within a Cesarean section scar would  also be a consideration for  this appearance.  2. Narrow fat plane between the mass in #1 and underlying small  bowel and bladder. No lymphadenopathy. No ascites.  3. Surgically absent gallbladder, uterus, and adnexa. Diverticulosis  of the colon.  Study discussed by telephone with PA BENJAMIN MANN on 07/19/2013 at  11:35 .    07/23/2013 Initial Diagnosis   Neoplasm of abdominal wall of uncertain behavior    6/37/8588 Initial Biopsy   Soft Tissue Needle Core Biopsy, abdominal wall mass - METASTATIC ADENOCARCINOMA INVOLVING SOFT TISSUE, SEE COMMENT. Microscopic Comment Needle core biopsies demonstrate diffuse involvement by well differentiated adenocarcinoma. The adenocarcinoma has the following immunophenotype: Cytokeratin 7 - strong diffuse expression Cytokeratin 20 - negative expression CDX2 - negative expression TTF-1 - negative expression Estrogen receptor - negative expression Vimentin - patchy mild to moderate expression   08/16/2013 - 09/27/2013 Chemotherapy   3 cycle of carboplatin, paclitaxel    10/11/2013 Imaging   CT A/P: A heterogeneous mass centered in the inferior right rectus abdominus  muscle measures 5.3 x 7.5 cm (previously 5.5 x 7.9 cm). No  pathologically enlarged lymph nodes. Scattered atherosclerotic  calcification of the arterial vasculature without abdominal aortic  aneurysm. No free fluid. No worrisome lytic or sclerotic lesions.  Degenerative changes are seen in the spine.   IMPRESSION:  Right rectus abdominus mass is stable to very minimally smaller than  on 07/19/2013.   11/05/2013 Surgery   Resection abdominal wall mass, abdominal wall reconstruction with Strattice matrix (10x11cm)  Operative findings: 8 involving the subcutaneous tissues, fascia, and rectus on the right side. No obvious intraperitoneal disease. Normal-appearing omentum   11/05/2013 Pathology Results   Soft tissue mass, simple excision, abdominal wall mass ADENOCARCINOMA. Microscopic Comment The specimen is  extensively  involved by adenocarcinoma which focally involves the edge of the specimen. Immunohistochemistry is performed and the tumor shows patchy positivity with Napsin-A and is negative with WT-1, estrogen receptor, progesterone receptor, gross disease cystic fluid protein, CDX-2, carcinoembryonic antigen, thyroid transcription factor-1, CD10 and CD117. The immunophenotype is nonspecific and additional immunohistochemistry will be performed and reported as an addendum. (JDP:kh 11/07/13) ADDENDUM: Additional immunohistochemistry is performed and the tumor is positive with cytokeratin AE1/AE3 and cytokeratin 7 and is negative with Calretinin, cytokeratin 5/6 and cytokeratin 20. The immunoreactivity is not specific for location of a primary tumor. (JDP:kh 11-08-13)   11/05/2013 Genetic Testing   [facility] testing MYC amplification - equivocal CREBBP [id number]* No FDA approved therapies in patient's tumor type or another tumor type    01/16/2014 - 02/27/2014 Radiation Therapy   45 gray to the target region within the abdominal wall postoperative area. She received 45 gray using a 4 field 3-D conformal technique at 1.8 gray per fraction. The patient then received a boost using a cone down 4 field technique for an additional 9 Gray. The final dose was 54 gray.   01/29/2018 Pathology Results   Soft Tissue Needle Core Biopsy, ant abd wall mass - ADENOCARCINOMA. - SEE MICROSCOPIC DESCRIPTION. Microscopic Comment The core biopsies consist of abundant dense, hyalinized fibrous tissue with scattered glands with cytologic atypia consistent with adenocarcinoma. Immunohistochemistry shows the tumor is positive with cytokeratin AE1/AE3 and cytokeratin 7 and is negative with cytokeratin 5/6, Calretinin, WT-1, estrogen receptor, progesterone receptor and cytokeratin 20. The morphology and immunophenotype are similar to the morphology in the abdominal wall mass excision from 11/07/[phone]).   03/01/2018  Imaging   PET: 1. Hypermetabolism associated with the cystic and solid suprapubic mass compatible with the reported clinical history of adenocarcinoma recurrence. Cystic components in the cranial aspect of the lesion are largely devoid of FDG accumulation with the most hypermetabolic tissue being inferiorly, immediately adjacent to the symphysis pubis. 2. Focal uptake identified in the left L3-4 facets without underlying bony lesion. This is presumed to represent uptake related to degenerative change. 3. No other sites of unexpected or suspicious hypermetabolism in the neck, chest, abdomen, or pelvis   03/02/2018 Imaging   CT A/P: 6.7 cm mass in the lower anterior abdominal wall containing cystic and solid components, suspicious for recurrence of previously resected/treated adenocarcinoma in this region. This abuts and could invade the anterior wall of the bladder.   No evidence for more distant metastatic disease within the abdomen or pelvis.    03/28/2018 Surgery   Exploratory laparotomy, resection abdominal wall tumor, oversew of bladder peritoneum, cystoscopy.  Findings: normal external genitalia, unable to palpate mass on exam. After opening the fascia, a cystic and solid mass was encountered below the lower edge of the incision, extending into the space of retzius and adherent to the pubic arch inferiorly, the bladder dome posteriorly, and prior abdominal wall repair anteriorly. At conclusion of the case, no nodularity was palpated in the tumor bed. Visualized bowel and omentum were free of disease. Pelvic peritoenum appeared normal. Cystoscopy revealed intact bladder mucosa with no visible suture. Bilateral ureteral orfices visualized.   03/28/2018 Pathology Results   Pelvic tumor, excision - Metastatic high grade adenocarcinoma, favor clear cell carcinoma of gynecologic origin, fragments up to 5.8 cm in size - See comment In the prior biopsy specimen [id number], the malignant  glands were positive for AE1/AE3 and CK7, and negative for CK20, CK5/6, calretinin, WT-1, ER, and PR. Additional immunohistochemical studies performed  on block A5 in the current specimen demonstrates that the tumor is positive for Napsin A and PAX-8, and is negative for TTF-1. 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   03/28/2018 Genetic Testing   [facility] No reportable alterations with companion diagnostic claims MS-stable, TMB - 4 Muts/Mb, CREBBP [id number]*   05/21/2018 -  Chemotherapy   The patient had palonosetron (ALOXI) injection 0.25 mg, 0.25 mg, Intravenous,  Once, 6 of 6 cycles Administration: 0.25 mg (05/21/2018), 0.25 mg (07/27/2018), 0.25 mg (06/11/2018), 0.25 mg (07/06/2018), 0.25 mg (08/17/2018), 0.25 mg (09/07/2018) pegfilgrastim-cbqv (UDENYCA) injection 6 mg, 6 mg, Subcutaneous, Once, 3 of 3 cycles Administration: 6 mg (07/30/2018), 6 mg (09/08/2018) CARBOplatin (PARAPLATIN) 310 mg in sodium chloride 0.9 % 250 mL chemo infusion, 310 mg (100 % of original dose 306.4 mg), Intravenous,  Once, 6 of 6 cycles Dose modification:   (original dose 306.4 mg, Cycle 1),   (original dose 306.4 mg, Cycle 4) Administration: 310 mg (05/21/2018), 300 mg (07/27/2018), 300 mg (06/11/2018), 300 mg (07/06/2018), 300 mg (08/17/2018), 300 mg (09/07/2018) gemcitabine (GEMZAR) 1,368 mg in sodium chloride 0.9 % 250 mL chemo infusion, 800 mg/m2 = 1,368 mg (100 % of original dose 800 mg/m2), Intravenous,  Once, 6 of 6 cycles Dose modification: 800 mg/m2 (original dose 800 mg/m2, Cycle 1, Reason: Provider Judgment) Administration: 1,368 mg (05/21/2018), 1,368 mg (07/27/2018), 1,368 mg (06/11/2018), 1,368 mg (07/06/2018), 1,368 mg (08/17/2018), 1,368 mg (09/07/2018)   for chemotherapy  treatment.      Pathology Results   A. SOFT TISSUE MASS, PELVIC BODY WALL, NEEDLE CORE BIOPSY:  - Metastatic adenocarcinoma, see comment.   COMMENT:   The tumor has malignant cells with cleared cytoplasm.  Immunohistochemistry is positive for cytokeratin 7, PAX8, racemase, and NapsinA. The cells are negative for ER, WT-1, and CD10. 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 Dr. Benay Spice was notified on 01/08/20.    10/19/2018 Imaging   CT A/P: 1. Previously noted lower anterior abdominal wall mass is no longer identified, presumably surgically resected. Cephalad to the site of the prior mass within the low anterior abdominal wall there is some soft tissue thickening seen on today's examination (axial image 54 of series 2). This is nonspecific and will serve as a baseline for future follow-up examinations. 2. No signs of metastatic disease elsewhere in the abdomen or pelvis. 3. Colonic diverticulosis without evidence of acute diverticulitis at this time. 4. Aortic atherosclerosis. 5. Additional incidental findings, as above.   04/18/2019 Imaging   CT A/P: 1. No evidence of recurrent or metastatic carcinoma within the abdomen or pelvis. 2. Colonic diverticulosis. No radiographic evidence of diverticulitis.   12/23/2019 Imaging   CT A/P: Within the ventral, midline abdominal wall soft tissue mass is identified anterior to the pubic symphysis measuring 5.2 x 3.3 by 3.4 cm (volume = 31 cm^3), image 59/5 and image 78/2. This is compared with 4.9 x 3.0 by 2.6 (volume = 20)cm IMPRESSION: 1. Stable to slight increase in size of ventral, midline abdominal wall soft tissue mass at the level of the pubic symphysis. 2. No signs of solid organ or nodal metastases. 3. Aortic atherosclerosis.   01/06/2020 Relapse/Recurrence   Anterior abdominal wall mass biopsy: A. SOFT TISSUE MASS,  PELVIC BODY  WALL, NEEDLE CORE BIOPSY:  - Metastatic adenocarcinoma, see comment.   COMMENT:   The tumor has malignant cells with cleared cytoplasm.  Immunohistochemistry is positive for cytokeratin 7, PAX8, racemase, and  NapsinA. The cells are negative for ER, WT-1, and CD10. The morphology  and immunophenotype are consistent with metastatic clear cell carcinoma  of gynecologic origin (by definition  high grade). Dr. Benay Spice was  notified on 01/08/20.    02/06/2020 Surgery   Exlap with resection of pelvic tumor densely adherent to the anterior aspect of the pubic arch. Surgery at [facility]  nodular mass palpable, fixed to the pubic arch on exam. On dissection into the mons, an irregularly shaped and nodular tumor was implanted on the pubic arch. No evidence of disease on visualized bowel or omentum. The palpated anterior abdominal wall was free of disease. The bladder dome peritoneum was free of disease. Few soft nodules on the anterior wall peritoneum were ablated using the bovie. No palpable tumor on or surrounding the pubic arch following resection.   02/06/2020 Pathology Results   A: "Pelvic cavity, pubic mons tumor, excision - Clear cell carcinoma, received fragmented, 5.5 cm in aggregate (see comment) - Carcinoma involves multiple cauterized tissue edges  ER negative, PR 1+ (20%)   04/06/2020 Genetic Testing   Negative genetic testing on the TumorNext-[facility]CancerNext testing.  Somatic testing was negative and no germline mutations identified.  The CancerNext gene panel offered by [employer] includes sequencing and rearrangement analysis for the following 34 genes:   APC, ATM, BARD1, BMPR1A, BRCA1, BRCA2, BRIP1, CDH1, CDK4, CDKN2A, CHEK2, DICER1, HOXB13, EPCAM, GREM1, MLH1, MRE11A, MSH2, MSH6, MUTYH, NBN, NF1, PALB2, PMS2, POLD1, POLE, PTEN, RAD50, RAD51C, RAD51D, SMAD4, SMARCA4, STK11, and TP53.  The report date is April 06, 2020.   Adenocarcinoma of abdominal wall, unclear primary  08/09/2013  Initial Diagnosis   Adenocarcinoma of abdominal wall, unclear primary    08/16/2013 - 09/25/2013 Chemotherapy   paclitaxel and carboplatin    11/05/2013 Surgery   resection abdominal wall mass     - 02/27/2014 Radiation Therapy   Completed volume directed radiation    01/06/2020 Relapse/Recurrence   Anterior abdominal wall mass biopsy: A. SOFT TISSUE MASS, PELVIC BODY WALL, NEEDLE CORE BIOPSY:  - Metastatic adenocarcinoma, see comment.   COMMENT:   The tumor has malignant cells with cleared cytoplasm.  Immunohistochemistry is positive for cytokeratin 7, PAX8, racemase, and  NapsinA. The cells are negative for ER, WT-1, and CD10. The morphology  and immunophenotype are consistent with metastatic clear cell carcinoma  of gynecologic origin (by definition  high grade). Dr. Benay Spice was  notified on 01/08/20.    02/06/2020 Surgery   Exlap with resection of pelvic tumor densely adherent to the anterior aspect of the pubic arch. Surgery at [facility]  nodular mass palpable, fixed to the pubic arch on exam. On dissection into the mons, an irregularly shaped and nodular tumor was implanted on the pubic arch. No evidence of disease on visualized bowel or omentum. The palpated anterior abdominal wall was free of disease. The bladder dome peritoneum was free of disease. Few soft nodules on the anterior wall peritoneum were ablated using the bovie. No palpable tumor on or surrounding the pubic arch following resection.   02/06/2020 Pathology Results   A: "Pelvic cavity, pubic mons tumor, excision - Clear cell carcinoma, received fragmented, 5.5 cm in aggregate (see comment) - Carcinoma involves multiple cauterized tissue edges  ER negative, PR 1+ (20%)   Metastatic  adenocarcinoma of ovary  05/03/2018 Initial Diagnosis   Malignant neoplasm of ovary ([place])    05/21/2018 -  Chemotherapy   The patient had palonosetron (ALOXI) injection 0.25 mg, 0.25 mg, Intravenous,  Once, 6 of 6  cycles Administration: 0.25 mg (05/21/2018), 0.25 mg (07/27/2018), 0.25 mg (06/11/2018), 0.25 mg (07/06/2018), 0.25 mg (08/17/2018), 0.25 mg (09/07/2018) pegfilgrastim-cbqv (UDENYCA) injection 6 mg, 6 mg, Subcutaneous, Once, 3 of 3 cycles Administration: 6 mg (07/30/2018), 6 mg (09/08/2018) CARBOplatin (PARAPLATIN) 310 mg in sodium chloride 0.9 % 250 mL chemo infusion, 310 mg (100 % of original dose 306.4 mg), Intravenous,  Once, 6 of 6 cycles Dose modification:   (original dose 306.4 mg, Cycle 1),   (original dose 306.4 mg, Cycle 4) Administration: 310 mg (05/21/2018), 300 mg (07/27/2018), 300 mg (06/11/2018), 300 mg (07/06/2018), 300 mg (08/17/2018), 300 mg (09/07/2018) gemcitabine (GEMZAR) 1,368 mg in sodium chloride 0.9 % 250 mL chemo infusion, 800 mg/m2 = 1,368 mg (100 % of original dose 800 mg/m2), Intravenous,  Once, 6 of 6 cycles Dose modification: 800 mg/m2 (original dose 800 mg/m2, Cycle 1, Reason: Provider Judgment) Administration: 1,368 mg (05/21/2018), 1,368 mg (07/27/2018), 1,368 mg (06/11/2018), 1,368 mg (07/06/2018), 1,368 mg (08/17/2018), 1,368 mg (09/07/2018)   for chemotherapy treatment.       Interval History: The patient presents today for follow-up.  She reports overall doing well since her last visit with me.  She endorses a good appetite without nausea or emesis.  She denies any abdominal or pelvic pain.  She reports occasional mild discomfort associated with her hernia, more on the right than left side.  She denies any vaginal bleeding or discharge.  She has been using Premarin 3 times a week and has not noticed any difference (was not having any symptoms previously).  She reports normal bowel and bladder function.  Past Medical/Surgical History: Past Medical History:  Diagnosis Date   Anxiety    new dx   Atrial fibrillation ([place])    Cancer ([place]) 1988, 2015   ovarian, adenocarcinoma   GERD (gastroesophageal reflux disease)    hx of years ago   Hx of radiation therapy 01/16/14-02/27/14    abdomen    Hypercholesteremia    under control with diet and fish oil   Hypertension    Ovarian cyst    PONV (postoperative nausea and vomiting)    Restless leg syndrome    Thyroid disease     Past Surgical History:  Procedure Laterality Date   APPENDECTOMY     APPLICATION OF A-CELL OF CHEST/ABDOMEN N/A 11/05/2013   Procedure: ABDOMINAL WALL RESCONTRUCTION WITH STRATUS;  Surgeon: Irene Limbo, MD;  Location: [facility];  Service: Plastics;  Laterality: N/A;   CHOLECYSTECTOMY  2009   LAPAROTOMY N/A 11/05/2013   Procedure: RESECTION OF PELVIC MASS;  Surgeon: Imagene Gurney A. Alycia Rossetti, MD;  Location: [facility];  Service: Gynecology;  Laterality: N/A;   LAPAROTOMY  03/2018   excision of suprapubic mass at [place]  as child   TOTAL ABDOMINAL HYSTERECTOMY  1988   ovarian cyst  BSO    Family History  Problem Relation Age of Onset   Heart disease Mother    Heart disease Sister    Diabetes Sister    Cancer Sister        melanoma-skin cancer   Breast cancer Paternal Grandmother        Age 28's   Cancer Paternal Grandmother  breast   Cancer Paternal Grandfather        prostate/bladder    Social History   Socioeconomic History   Marital status: Single    Spouse name: Not on file   Number of children: Not on file   Years of education: Not on file   Highest education level: Not on file  Occupational History   Not on file  Tobacco Use   Smoking status: Never   Smokeless tobacco: Never  Vaping Use   Vaping Use: Never used  Substance and Sexual Activity   Alcohol use: No    Alcohol/week: 0.0 standard drinks   Drug use: No   Sexual activity: Never    Birth control/protection: Surgical    Comment: HYST  Other Topics Concern   Not on file  Social History Narrative   Single-never married   No children or pets   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 , Disp: , Rfl:    cholecalciferol (VITAMIN D3) 25 MCG (1000 UNIT) tablet, Take 1,000 Units by mouth daily., Disp: , Rfl:    conjugated estrogens (PREMARIN) vaginal cream, Place 1 Applicatorful vaginally 3 (three) times a week. Finger tip amount of cream, applied to vulva and outer vagina at night 3 x a week., Disp: 42.5 g, Rfl: 3   ELIQUIS 5 MG TABS tablet, TAKE (1) TABLET BY MOUTH TWICE DAILY., Disp: 180 tablet, Rfl: 1   levothyroxine (SYNTHROID) 50 MCG tablet, Take 50 mcg by mouth daily., Disp: , Rfl:    metoprolol tartrate (LOPRESSOR) 25 MG tablet, TAKE (1/2) TABLET BY MOUTH TWICE DAILY., Disp: 90 tablet, Rfl: 3   Multiple Vitamin (MULTIVITAMIN WITH MINERALS) TABS tablet, Take 1 tablet by mouth daily., Disp: , Rfl:    pramipexole (MIRAPEX) 0.125 MG tablet, Take 0.25 mg by mouth 2 (two) times daily., Disp: , Rfl:    HYDROcodone-acetaminophen (NORCO/VICODIN) 5-325 MG tablet, Take 0.5-1 tablets by mouth at bedtime.  (Patient not taking: Reported on 10/27/2020), Disp: , Rfl:    Omega-3 Fatty Acids (FISH OIL) 1200 MG CAPS, Take 1,200 mg by mouth daily. (Patient not taking: Reported on 10/27/2020), Disp: , Rfl:  No current facility-administered medications for this visit.  Facility-Administered Medications Ordered in Other Visits:    0.9 %  sodium chloride infusion, , Intravenous, Once, [name] B, MD   0.9 %  sodium chloride infusion, , Intravenous, Once, Ladell Pier, MD  Review of Systems: Denies appetite changes, fevers, chills, fatigue, unexplained weight changes. Denies hearing loss, neck lumps or masses, mouth  sores, ringing in ears or voice changes. Denies cough or wheezing.  Denies shortness of breath. Denies chest pain or palpitations. Denies leg swelling. Denies abdominal distention, pain, blood in stools, constipation, diarrhea, nausea, vomiting, or early satiety. Denies pain with intercourse, dysuria, frequency, hematuria or incontinence. Denies hot flashes, pelvic pain, vaginal bleeding or vaginal discharge.   Denies joint pain, back pain or muscle pain/cramps. Denies itching, rash, or wounds. Denies dizziness, headaches, numbness or seizures.  Denies swollen lymph nodes or glands, denies easy bruising or bleeding. Denies anxiety, depression, confusion, or decreased concentration.  Physical Exam: BP 107/62   Pulse 64   Temp (!) 97.3 F (36.3 C)   Resp 18   Wt 139 lb 9.6 oz (63.3 kg)   SpO2 100%   BMI 22.19 kg/m  General: Alert, oriented, no acute distress. HEENT: Normocephalic, atraumatic, sclera anicteric. Chest: Clear to auscultation bilaterally.  Unlabored breathing on room air, no wheezing. Abdomen: soft, nontender.  Normoactive bowel sounds.  No masses or hepatosplenomegaly appreciated in upper mid abdomen.  The patient has some fullness over her mons and extending down to her labia, more on the right than the left.  There is mild erythema over this area, minimal tenderness to palpation on the right mons and labia.  Well-healed incisions. Extremities: Grossly normal range of motion.  Warm, well perfused.  No edema bilaterally. Skin: No rashes or lesions noted. Lymphatics: No cervical, supraclavicular, or inguinal adenopathy. GU: External genitalia notable as above with fullness in the right labia extending about to the posterior aspect of the vaginal introitus, consistent with known hernia.  The skin over the right labia is very mildly erythematous although nonblanching.  Atrophy is less than noted on her last exam with still some atrophy prominent between the fold of the labia  minora and majora, this area measures now a little under 1 cm x 1-2 cm.  There is some loss of architecture of the vulva.  Given patient's discomfort with exam last time, single-digit exam performed as well as rectovaginal exam.  No masses, nodularity appreciated on either exam.  Laboratory & Radiologic Studies: None new  Assessment & Plan: [name] is a 75 y.o. 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  The patient is doing well today and is NED on exam.  She is relatively asymptomatic from her bilateral hernias with more prominent small bowel noted along the right labia.  She has previously seen general surgery with plan for expectant management unless she were to become symptomatic.  She has been using vaginal estrogen, and had less pain with exam today.  Additionally, the spot on her left vulva has improved somewhat since her last exam.  I recommend that she continue using the Premarin cream 3 times a week.  Her posttreatment CT scan was in January.  I recommend that we get a CT of her abdomen and pelvis to evaluate for recurrent disease given her multiple recurrent cysts, some of which were asymptomatic.  I have ordered this and will try to schedule it either the same day or several days before her visit with Dr. Benay Spice.  In terms of follow-up, we discussed signs and symptoms that should prompt a phone call to see me sooner than her neck scheduled visit.  I will plan to see her back in October, which will be 3 months after her visit with Dr. Benay Spice.   36 minutes of total time was spent for this patient encounter, including preparation, face-to-face counseling with the patient and coordination of care, and documentation of the encounter.  Jeral Pinch, MD  Division of Gynecologic Oncology   Department of Obstetrics and Gynecology  [facility]

## 2020-10-28 NOTE — Patient Instructions (Signed)
It was great to see you today!  I am glad to hear that you are doing so well.  I did not feel any evidence of cancer recurrence on your exam.  Given that it has been 5 months since we did a CT scan after you have finished radiation, I would like to get a CT scan again in mid July.  I have ordered this and we will get it scheduled and call you with the time.  You are seeing Dr. Benay Spice next month, and we will plan to have you follow-up with me 3 months after that in October.

## 2020-11-12 DIAGNOSIS — E7849 Other hyperlipidemia: Secondary | ICD-10-CM | POA: Diagnosis not present

## 2020-11-12 DIAGNOSIS — I4891 Unspecified atrial fibrillation: Secondary | ICD-10-CM | POA: Diagnosis not present

## 2020-11-12 DIAGNOSIS — K136 Irritative hyperplasia of oral mucosa: Secondary | ICD-10-CM | POA: Diagnosis not present

## 2020-11-20 DIAGNOSIS — E785 Hyperlipidemia, unspecified: Secondary | ICD-10-CM | POA: Diagnosis not present

## 2020-11-20 DIAGNOSIS — E040 Nontoxic diffuse goiter: Secondary | ICD-10-CM | POA: Diagnosis not present

## 2020-11-20 DIAGNOSIS — R5383 Other fatigue: Secondary | ICD-10-CM | POA: Diagnosis not present

## 2020-11-20 DIAGNOSIS — E039 Hypothyroidism, unspecified: Secondary | ICD-10-CM | POA: Diagnosis not present

## 2020-11-20 DIAGNOSIS — I959 Hypotension, unspecified: Secondary | ICD-10-CM | POA: Diagnosis not present

## 2020-11-25 DIAGNOSIS — E040 Nontoxic diffuse goiter: Secondary | ICD-10-CM | POA: Diagnosis not present

## 2020-11-25 DIAGNOSIS — I959 Hypotension, unspecified: Secondary | ICD-10-CM | POA: Diagnosis not present

## 2020-11-25 DIAGNOSIS — E039 Hypothyroidism, unspecified: Secondary | ICD-10-CM | POA: Diagnosis not present

## 2020-11-25 DIAGNOSIS — R5383 Other fatigue: Secondary | ICD-10-CM | POA: Diagnosis not present

## 2020-11-25 DIAGNOSIS — E785 Hyperlipidemia, unspecified: Secondary | ICD-10-CM | POA: Diagnosis not present

## 2020-11-26 ENCOUNTER — Ambulatory Visit (HOSPITAL_COMMUNITY)
Admission: RE | Admit: 2020-11-26 | Discharge: 2020-11-26 | Disposition: A | Discharge status: Home / Self Care | Payer: Medicare Other | Source: Ambulatory Visit | Attending: Gynecologic Oncology | Admitting: Gynecologic Oncology

## 2020-11-26 ENCOUNTER — Encounter (HOSPITAL_COMMUNITY): Payer: Self-pay

## 2020-11-26 ENCOUNTER — Other Ambulatory Visit: Payer: Self-pay

## 2020-11-26 DIAGNOSIS — K439 Ventral hernia without obstruction or gangrene: Secondary | ICD-10-CM | POA: Diagnosis not present

## 2020-11-26 DIAGNOSIS — C563 Malignant neoplasm of bilateral ovaries: Secondary | ICD-10-CM | POA: Diagnosis not present

## 2020-11-26 DIAGNOSIS — C578 Malignant neoplasm of overlapping sites of female genital organs: Secondary | ICD-10-CM | POA: Diagnosis not present

## 2020-11-26 DIAGNOSIS — N281 Cyst of kidney, acquired: Secondary | ICD-10-CM | POA: Diagnosis not present

## 2020-11-26 DIAGNOSIS — C801 Malignant (primary) neoplasm, unspecified: Secondary | ICD-10-CM | POA: Insufficient documentation

## 2020-11-26 DIAGNOSIS — K5730 Diverticulosis of large intestine without perforation or abscess without bleeding: Secondary | ICD-10-CM | POA: Diagnosis not present

## 2020-11-26 LAB — POCT I-STAT CREATININE: Creatinine, Ser: 0.8 mg/dL (ref 0.44–1.00)

## 2020-11-26 MED ORDER — SODIUM CHLORIDE (PF) 0.9 % IJ SOLN
INTRAMUSCULAR | Status: AC
  Filled 2020-11-26: qty 50

## 2020-11-26 MED ORDER — IOHEXOL 350 MG/ML SOLN
80.0000 mL | Freq: Once | INTRAVENOUS | Status: AC | PRN
  Administered 2020-11-26: 80 mL via INTRAVENOUS

## 2020-11-30 ENCOUNTER — Telehealth: Payer: Self-pay | Admitting: Gynecologic Oncology

## 2020-11-30 NOTE — Telephone Encounter (Signed)
Called patient to review CT findings. No answer, left message requesting callback.  Valarie Cones MD

## 2020-11-30 NOTE — Telephone Encounter (Signed)
[name] returned my call, we discussed CT results.  She is very happy with this news.  All questions answered.  Valarie Cones MD

## 2020-12-03 ENCOUNTER — Inpatient Hospital Stay: Payer: Medicare Other | Attending: Gynecologic Oncology | Admitting: Oncology

## 2020-12-03 ENCOUNTER — Other Ambulatory Visit: Payer: Medicare Other

## 2020-12-03 ENCOUNTER — Inpatient Hospital Stay: Payer: Medicare Other

## 2020-12-03 ENCOUNTER — Other Ambulatory Visit: Payer: Self-pay

## 2020-12-03 VITALS — BP 116/49 | HR 60 | Temp 98.5°F | Resp 16 | Wt 138.8 lb

## 2020-12-03 DIAGNOSIS — C801 Malignant (primary) neoplasm, unspecified: Secondary | ICD-10-CM | POA: Insufficient documentation

## 2020-12-03 DIAGNOSIS — Z87440 Personal history of urinary (tract) infections: Secondary | ICD-10-CM | POA: Diagnosis not present

## 2020-12-03 DIAGNOSIS — Z8543 Personal history of malignant neoplasm of ovary: Secondary | ICD-10-CM | POA: Insufficient documentation

## 2020-12-03 DIAGNOSIS — C569 Malignant neoplasm of unspecified ovary: Secondary | ICD-10-CM

## 2020-12-03 DIAGNOSIS — C786 Secondary malignant neoplasm of retroperitoneum and peritoneum: Secondary | ICD-10-CM | POA: Insufficient documentation

## 2020-12-03 DIAGNOSIS — I4891 Unspecified atrial fibrillation: Secondary | ICD-10-CM | POA: Diagnosis not present

## 2020-12-03 DIAGNOSIS — G2581 Restless legs syndrome: Secondary | ICD-10-CM | POA: Insufficient documentation

## 2020-12-03 DIAGNOSIS — Z7901 Long term (current) use of anticoagulants: Secondary | ICD-10-CM | POA: Diagnosis not present

## 2020-12-03 NOTE — Progress Notes (Signed)
[name]   Diagnosis: Gynecologic cancer  INTERVAL HISTORY:   [name] returns as scheduled.  She feels well.  She has a good appetite.  She relates weight loss to a change in her diet.  She saw Dr. Rosendo Gros and reports he does not recommend repair of the incisional hernias.  No new complaint.  Objective:  Vital signs in last 24 hours:  Blood pressure (!) 116/49, pulse 60, temperature 98.5 F (36.9 C), temperature source Oral, resp. rate 16, weight 138 lb 12.8 oz (63 kg), SpO2 100 %.    Lymphatics: No cervical, supraclavicular, axillary, or inguinal nodes Resp: Lungs clear bilaterally Cardio: Regular rate and rhythm GI: No mass, nontender, no hepatosplenomegaly, no apparent ascites, reducible hernias in the suprapubic area Vascular: No leg edema   Lab Results:  Lab Results  Component Value Date   WBC 6.2 05/06/2020   HGB 12.3 05/06/2020   HCT 36.1 05/06/2020   MCV 95.8 05/06/2020   PLT 209 05/06/2020   NEUTROABS 4.0 05/06/2020    CMP  Lab Results  Component Value Date   NA 141 05/06/2020   K 4.9 05/06/2020   CL 104 05/06/2020   CO2 27 05/06/2020   GLUCOSE 97 05/06/2020   BUN 15 05/06/2020   CREATININE 0.80 11/26/2020   CALCIUM 10.3 05/06/2020   PROT 7.2 05/06/2020   ALBUMIN 3.8 05/06/2020   AST 19 05/06/2020   ALT 15 05/06/2020   ALKPHOS 54 05/06/2020   BILITOT 0.2 (L) 05/06/2020   GFRNONAA >60 05/06/2020   GFRAA >60 12/25/2019    Lab Results  Component Value Date   CA125 10 03/17/2016      Medications: I have reviewed the patient's current medications.   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   Elevated CA 125.   Cycle 1 Taxol/carboplatin 08/16/2013.   Cycle 2 Taxol/carboplatin 09/06/2013.   Cycle 3 Taxol/carboplatin 09/27/2013   CT 10/11/2013  with a stable abdominal wall mass. Status post resection of abdominal wall mass with abdominal wall reconstruction 11/05/2013. Final pathology showed adenocarcinoma. Specimen extensively involved with adenocarcinoma which focally involved the edge of the specimen. Immunostains and Foundation 1 testing were nonspecific for a primary tumor location. Adjuvant radiation to the abdominal wall 01/16/2014 through 02/27/2014 CT 01/15/2018 while in the emergency room for evaluation of back pain- midline abdominal wall mass above the pubic symphysis and adjacent to the bladder Ultrasound-guided biopsy of the abdominal wall mass 01/25/2018-adenocarcinoma similar to the 2015 biopsy PET scan 03/01/2018-hypermetabolism associated with a cystic and solid suprapubic mass.  Focal uptake identified in the left L3-4 facets without underlying bony lesion.  No other sites of unexpected or suspicious hypermetabolism in the neck, chest, abdomen or pelvis. Exploratory laparotomy, resection of abdominal wall tumor, oversew of bladder peritoneum, cystoscopy 03/28/2018 (findings included a cystic and solid mass encountered below the lower edge of the incision extending into the space of retzius and adherent to the pubic arch inferiorly, bladder dome posteriorly and prior abdominal wall repair anteriorly.  No nodularity palpated in the tumor bed.  Visualized bowel and omentum free of disease.  Pelvic peritoneum appeared normal).  A "small "amount of tumor on the periosteum was cauterized using a Bovie. Pathology on pelvic tumor-metastatic high-grade adenocarcinoma, favor clear-cell carcinoma of gynecologic origin, fragments up to 5.8 cm in size; ER negative, PR  positive, 1+ (weak staining) to focally 2+, 30-40%, MSI-stable, mutation burden-4,CREBBP alteration Cycle 1 gemcitabine/carboplatin 05/21/2018; day 8 held due to neutropenia Cycle 2 gemcitabine/carboplatin 06/11/2018 (day 8 gemcitabine eliminated) Cycle 3 gemcitabine/carboplatin  07/06/2018 Cycle 4 gemcitabine/carboplatin 07/27/2018 (Udenyca added) Cycle 5 gemcitabine/carboplatin 08/17/2018 Cycle 6 gemcitabine/carboplatin 09/07/2018  CT 10/19/2018- low anterior abdominal wall mass no longer identified, soft tissue thickening cephalad to the site of prior mass-nonspecific, no additional evidence of metastatic disease CT 04/18/2019-no evidence of recurrent or metastatic disease CT 12/23/2019-slight increase in size of ventral midline soft tissue mass near the pubis, no other evidence of disease progression Biopsy pelvic body wall soft tissue mass 01/06/2020-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 Surgical excision of the soft tissue mass adherent to the pubis 02/06/2020-clear cell carcinoma, positive surgical margins Adjuvant radiation 03/25/2020- 04/29/2020 CT abdomen/pelvis 05/19/2020- resolution of suprapubic abdominal wall mass, no evidence of metastatic disease, moderate right and small left hernias CT abdomen/pelvis 11/26/2020-no recurrence of the lower pelvic soft tissue mass, small bilateral lower pelvic hernias, no evidence of recurrent or metastatic disease Ovarian cancer in 1988, low-grade adenocarcinoma with areas of "borderline" carcinoma, status post a hysterectomy and bilateral oophorectomy. New onset atrial fibrillation August 2016, maintained on eliquis Recurrent urinary tract infections Restless legs      Disposition: 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  We will follow-up on the CA125 from today.  She is scheduled to see Dr. Berline Lopes in October.  She will return for an office visit in 6 months.  She will contact us in the interim as needed.  [name], MD  12/03/2020  10:38 AM

## 2020-12-04 LAB — CA 125: Cancer Antigen (CA) 125: 19.2 U/mL (ref 0.0–38.1)

## 2020-12-25 ENCOUNTER — Other Ambulatory Visit: Payer: Self-pay | Admitting: Family Medicine

## 2020-12-25 NOTE — Telephone Encounter (Signed)
Pt last saw [name] July, NP on 03/03/20, last labs 11/26/20 Creat 0.80, age 74, weight 63kg, based on specified criteria pt is on appropriate dosage of Eliquis 5mg  BID.  Will refill rx.

## 2021-01-19 DIAGNOSIS — M1991 Primary osteoarthritis, unspecified site: Secondary | ICD-10-CM | POA: Diagnosis not present

## 2021-01-19 DIAGNOSIS — I700 Atherosclerosis of aorta: Secondary | ICD-10-CM | POA: Diagnosis not present

## 2021-01-19 DIAGNOSIS — E063 Autoimmune thyroiditis: Secondary | ICD-10-CM | POA: Diagnosis not present

## 2021-01-19 DIAGNOSIS — I4891 Unspecified atrial fibrillation: Secondary | ICD-10-CM | POA: Diagnosis not present

## 2021-01-19 DIAGNOSIS — G894 Chronic pain syndrome: Secondary | ICD-10-CM | POA: Diagnosis not present

## 2021-01-19 DIAGNOSIS — C799 Secondary malignant neoplasm of unspecified site: Secondary | ICD-10-CM | POA: Diagnosis not present

## 2021-01-19 DIAGNOSIS — M2550 Pain in unspecified joint: Secondary | ICD-10-CM | POA: Diagnosis not present

## 2021-01-19 DIAGNOSIS — Z6821 Body mass index (BMI) 21.0-21.9, adult: Secondary | ICD-10-CM | POA: Diagnosis not present

## 2021-03-01 ENCOUNTER — Other Ambulatory Visit: Payer: Self-pay

## 2021-03-01 ENCOUNTER — Inpatient Hospital Stay: Payer: Medicare Other | Attending: Gynecologic Oncology | Admitting: Gynecologic Oncology

## 2021-03-01 ENCOUNTER — Encounter: Payer: Self-pay | Admitting: Gynecologic Oncology

## 2021-03-01 ENCOUNTER — Inpatient Hospital Stay: Payer: Medicare Other

## 2021-03-01 VITALS — BP 115/63 | HR 56 | Temp 98.1°F | Resp 16 | Ht 66.0 in | Wt 135.8 lb

## 2021-03-01 DIAGNOSIS — Z9221 Personal history of antineoplastic chemotherapy: Secondary | ICD-10-CM | POA: Insufficient documentation

## 2021-03-01 DIAGNOSIS — Z90710 Acquired absence of both cervix and uterus: Secondary | ICD-10-CM | POA: Insufficient documentation

## 2021-03-01 DIAGNOSIS — Z8543 Personal history of malignant neoplasm of ovary: Secondary | ICD-10-CM | POA: Insufficient documentation

## 2021-03-01 DIAGNOSIS — Z90722 Acquired absence of ovaries, bilateral: Secondary | ICD-10-CM | POA: Diagnosis not present

## 2021-03-01 DIAGNOSIS — Z923 Personal history of irradiation: Secondary | ICD-10-CM | POA: Insufficient documentation

## 2021-03-01 DIAGNOSIS — C569 Malignant neoplasm of unspecified ovary: Secondary | ICD-10-CM

## 2021-03-01 DIAGNOSIS — C577 Malignant neoplasm of other specified female genital organs: Secondary | ICD-10-CM

## 2021-03-01 DIAGNOSIS — C801 Malignant (primary) neoplasm, unspecified: Secondary | ICD-10-CM

## 2021-03-01 NOTE — Patient Instructions (Signed)
It was good to see you today. I don't see or feel any evidence of cancer. I have ordered a CT scan to be done several days before your visit with Dr. Benay Spice in January. If you develop any new symptoms before that visit, please call to see me sooner.  I will plan to see you in April. Please call the clinic sometime after 05/16/21 to schedule a visit with me.

## 2021-03-01 NOTE — Progress Notes (Signed)
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 Pathology report says "focal well differentiated serous cystadenocarcinoma". The comment is that the adenocarcinomatous component is limited to rare foci of small invasive glandular structures. Another report states that there is a small focus of papillary adenocarcinoma in a serous cystadenofibroma of the left ovary. Uterus, cervix, left fallopian tube, right adnexa and pelvic washings all without evidence of malignancy. Appendix also removed at the time of surgery.  Her CA-125 at that time was reported to be 33. She did not receive adjuvant therapy for her initial cancer diagnosis.  She presented in 07/2013 with abdominal pain and mass was noted along right inferior rectus muscle. Biopsy of the mass showed adenocarcinoma of unknown primary. She received 3 cycles of neoadjuvant chemotherapy (no response), carboplatin/taxol. After resection, she underwent RT due to positie tumor margin. This was completed in 02/2014.  CA-125: 08/07/13: 128 09/27/13: 147 12/20/13: 12.9 03/28/14: 9 07/31/14: 8 01/2015: 8 07/2015: 17.3 11/11/15: 16.9 09/2016: 16.9 02/2017: 19.4 08/2017: 19.2 01/2018: 19.2 02/2018: 19.5 08/2018: 19.2 01/2019: 18.1 04/2019: 19.5 08/22/19: 18.6 12/23/19: 24.4   Neoplasm of abdominal wall of uncertain behavior  09/20/175 Imaging   Ct A/P: 1. Lower abdominal/suprapubic ventral wall lobulated soft tissue  mass, arising within the lower rectus musculature more so on the  right. 5.5 x 7.9 x 9.7 cm. Favor sarcoma or other connective tissue  tumor. This should be amenable to percutaneous biopsy. In a younger  female patient, endometriosis within a Cesarean section scar would  also be a consideration for  this appearance.  2. Narrow fat plane between the mass in #1 and underlying small  bowel and bladder. No lymphadenopathy. No ascites.  3. Surgically absent gallbladder, uterus, and adnexa. Diverticulosis  of the colon.  Study discussed by telephone with PA BENJAMIN MANN on 07/19/2013 at  11:35 .    07/23/2013 Initial Diagnosis   Neoplasm of abdominal wall of uncertain behavior   9/39/0300 Initial Biopsy   Soft Tissue Needle Core Biopsy, abdominal wall mass - METASTATIC ADENOCARCINOMA INVOLVING SOFT TISSUE, SEE COMMENT. Microscopic Comment Needle core biopsies demonstrate diffuse involvement by well differentiated adenocarcinoma. The adenocarcinoma has the following immunophenotype: Cytokeratin 7 - strong diffuse expression Cytokeratin 20 - negative expression CDX2 - negative expression TTF-1 - negative expression Estrogen receptor - negative expression Vimentin - patchy mild to moderate expression   08/16/2013 - 09/27/2013 Chemotherapy   3 cycle of carboplatin, paclitaxel    10/11/2013 Imaging   CT A/P: A heterogeneous mass centered in the inferior right rectus abdominus  muscle measures 5.3 x 7.5 cm (previously 5.5 x 7.9 cm). No  pathologically enlarged lymph nodes. Scattered atherosclerotic  calcification of the arterial vasculature without abdominal aortic  aneurysm. No free fluid. No worrisome lytic or sclerotic lesions.  Degenerative changes are seen in the spine.   IMPRESSION:  Right rectus abdominus mass is stable to very minimally smaller than  on 07/19/2013.   11/05/2013 Surgery   Resection abdominal wall mass, abdominal wall reconstruction with Strattice matrix (10x11cm)  Operative findings: 8 involving the subcutaneous tissues, fascia, and rectus on the right side. No obvious intraperitoneal disease. Normal-appearing omentum   11/05/2013 Pathology Results   Soft tissue mass, simple excision, abdominal wall mass ADENOCARCINOMA. Microscopic Comment The specimen is  extensively involved  by adenocarcinoma which focally involves the edge of the specimen. Immunohistochemistry is performed and the tumor shows patchy positivity with Napsin-A and is negative with WT-1, estrogen receptor, progesterone receptor, gross disease cystic fluid protein, CDX-2, carcinoembryonic antigen, thyroid transcription factor-1, CD10 and CD117. The immunophenotype is nonspecific and additional immunohistochemistry will be performed and reported as an addendum. (JDP:kh 11/07/13) ADDENDUM: Additional immunohistochemistry is performed and the tumor is positive with cytokeratin AE1/AE3 and cytokeratin 7 and is negative with Calretinin, cytokeratin 5/6 and cytokeratin 20. The immunoreactivity is not specific for location of a primary tumor. (JDP:kh 11-08-13)   11/05/2013 Genetic Testing   [facility] testing MYC amplification - equivocal CREBBP Q1765* No FDA approved therapies in patient's tumor type or another tumor type    01/16/2014 - 02/27/2014 Radiation Therapy   45 gray to the target region within the abdominal wall postoperative area. She received 45 gray using a 4 field 3-D conformal technique at 1.8 gray per fraction. The patient then received a boost using a cone down 4 field technique for an additional 9 Gray. The final dose was 54 gray.   01/29/2018 Pathology Results   Soft Tissue Needle Core Biopsy, ant abd wall mass - ADENOCARCINOMA. - SEE MICROSCOPIC DESCRIPTION. Microscopic Comment The core biopsies consist of abundant dense, hyalinized fibrous tissue with scattered glands with cytologic atypia consistent with adenocarcinoma. Immunohistochemistry shows the tumor is positive with cytokeratin AE1/AE3 and cytokeratin 7 and is negative with cytokeratin 5/6, Calretinin, WT-1, estrogen receptor, progesterone receptor and cytokeratin 20. The morphology and immunophenotype are similar to the morphology in the abdominal wall mass excision from 11/07/2013 [phone]).   03/01/2018  Imaging   PET: 1. Hypermetabolism associated with the cystic and solid suprapubic mass compatible with the reported clinical history of adenocarcinoma recurrence. Cystic components in the cranial aspect of the lesion are largely devoid of FDG accumulation with the most hypermetabolic tissue being inferiorly, immediately adjacent to the symphysis pubis. 2. Focal uptake identified in the left L3-4 facets without underlying bony lesion. This is presumed to represent uptake related to degenerative change. 3. No other sites of unexpected or suspicious hypermetabolism in the neck, chest, abdomen, or pelvis   03/02/2018 Imaging   CT A/P: 6.7 cm mass in the lower anterior abdominal wall containing cystic and solid components, suspicious for recurrence of previously resected/treated adenocarcinoma in this region. This abuts and could invade the anterior wall of the bladder.   No evidence for more distant metastatic disease within the abdomen or pelvis.    03/28/2018 Surgery   Exploratory laparotomy, resection abdominal wall tumor, oversew of bladder peritoneum, cystoscopy.  Findings: normal external genitalia, unable to palpate mass on exam. After opening the fascia, a cystic and solid mass was encountered below the lower edge of the incision, extending into the space of retzius and adherent to the pubic arch inferiorly, the bladder dome posteriorly, and prior abdominal wall repair anteriorly. At conclusion of the case, no nodularity was palpated in the tumor bed. Visualized bowel and omentum were free of disease. Pelvic peritoenum appeared normal. Cystoscopy revealed intact bladder mucosa with no visible suture. Bilateral ureteral orfices visualized.   03/28/2018 Pathology Results   Pelvic tumor, excision - Metastatic high grade adenocarcinoma, favor clear cell carcinoma of gynecologic origin, fragments up to 5.8 cm in size - See comment In the prior biopsy specimen [id number], the malignant  glands were positive for AE1/AE3 and CK7, and negative for CK20, CK5/6, calretinin, WT-1, ER, and PR. Additional immunohistochemical studies performed on  block A5 in the current specimen demonstrates that the tumor is positive for Napsin A and PAX-8, and is negative for TTF-1. 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   03/28/2018 Genetic Testing   [facility] No reportable alterations with companion diagnostic claims MS-stable, TMB - 4 Muts/Mb, CREBBP [id number]*   05/21/2018 -  Chemotherapy   The patient had palonosetron (ALOXI) injection 0.25 mg, 0.25 mg, Intravenous,  Once, 6 of 6 cycles Administration: 0.25 mg (05/21/2018), 0.25 mg (07/27/2018), 0.25 mg (06/11/2018), 0.25 mg (07/06/2018), 0.25 mg (08/17/2018), 0.25 mg (09/07/2018) pegfilgrastim-cbqv (UDENYCA) injection 6 mg, 6 mg, Subcutaneous, Once, 3 of 3 cycles Administration: 6 mg (07/30/2018), 6 mg (09/08/2018) CARBOplatin (PARAPLATIN) 310 mg in sodium chloride 0.9 % 250 mL chemo infusion, 310 mg (100 % of original dose 306.4 mg), Intravenous,  Once, 6 of 6 cycles Dose modification:   (original dose 306.4 mg, Cycle 1),   (original dose 306.4 mg, Cycle 4) Administration: 310 mg (05/21/2018), 300 mg (07/27/2018), 300 mg (06/11/2018), 300 mg (07/06/2018), 300 mg (08/17/2018), 300 mg (09/07/2018) gemcitabine (GEMZAR) 1,368 mg in sodium chloride 0.9 % 250 mL chemo infusion, 800 mg/m2 = 1,368 mg (100 % of original dose 800 mg/m2), Intravenous,  Once, 6 of 6 cycles Dose modification: 800 mg/m2 (original dose 800 mg/m2, Cycle 1, Reason: Provider Judgment) Administration: 1,368 mg (05/21/2018), 1,368 mg (07/27/2018), 1,368 mg (06/11/2018), 1,368 mg (07/06/2018), 1,368 mg (08/17/2018), 1,368 mg (09/07/2018)   for chemotherapy  treatment.      Pathology Results   A. SOFT TISSUE MASS, PELVIC BODY WALL, NEEDLE CORE BIOPSY:  - Metastatic adenocarcinoma, see comment.   COMMENT:   The tumor has malignant cells with cleared cytoplasm.  Immunohistochemistry is positive for cytokeratin 7, PAX8, racemase, and NapsinA. The cells are negative for ER, WT-1, and CD10. 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 Dr. Benay Spice was notified on 01/08/20.    10/19/2018 Imaging   CT A/P: 1. Previously noted lower anterior abdominal wall mass is no longer identified, presumably surgically resected. Cephalad to the site of the prior mass within the low anterior abdominal wall there is some soft tissue thickening seen on today's examination (axial image 54 of series 2). This is nonspecific and will serve as a baseline for future follow-up examinations. 2. No signs of metastatic disease elsewhere in the abdomen or pelvis. 3. Colonic diverticulosis without evidence of acute diverticulitis at this time. 4. Aortic atherosclerosis. 5. Additional incidental findings, as above.   04/18/2019 Imaging   CT A/P: 1. No evidence of recurrent or metastatic carcinoma within the abdomen or pelvis. 2. Colonic diverticulosis. No radiographic evidence of diverticulitis.   12/23/2019 Imaging   CT A/P: Within the ventral, midline abdominal wall soft tissue mass is identified anterior to the pubic symphysis measuring 5.2 x 3.3 by 3.4 cm (volume = 31 cm^3), image 59/5 and image 78/2. This is compared with 4.9 x 3.0 by 2.6 (volume = 20)cm IMPRESSION: 1. Stable to slight increase in size of ventral, midline abdominal wall soft tissue mass at the level of the pubic symphysis. 2. No signs of solid organ or nodal metastases. 3. Aortic atherosclerosis.   01/06/2020 Relapse/Recurrence   Anterior abdominal wall mass biopsy: A. SOFT TISSUE MASS, PELVIC  BODY  WALL, NEEDLE CORE BIOPSY:  - Metastatic adenocarcinoma, see comment.   COMMENT:   The tumor has malignant cells with cleared cytoplasm.  Immunohistochemistry is positive for cytokeratin 7, PAX8, racemase, and  NapsinA. The cells are negative for ER, WT-1, and CD10. The morphology  and immunophenotype are consistent with metastatic clear cell carcinoma  of gynecologic origin (by definition  high grade). Dr. Benay Spice was  notified on 01/08/20.    02/06/2020 Surgery   Exlap with resection of pelvic tumor densely adherent to the anterior aspect of the pubic arch. Surgery at [facility]  nodular mass palpable, fixed to the pubic arch on exam. On dissection into the mons, an irregularly shaped and nodular tumor was implanted on the pubic arch. No evidence of disease on visualized bowel or omentum. The palpated anterior abdominal wall was free of disease. The bladder dome peritoneum was free of disease. Few soft nodules on the anterior wall peritoneum were ablated using the bovie. No palpable tumor on or surrounding the pubic arch following resection.   02/06/2020 Pathology Results   A: "Pelvic cavity, pubic mons tumor, excision - Clear cell carcinoma, received fragmented, 5.5 cm in aggregate (see comment) - Carcinoma involves multiple cauterized tissue edges  ER negative, PR 1+ (20%)   04/06/2020 Genetic Testing   Negative genetic testing on the TumorNext-[facility]CancerNext testing.  Somatic testing was negative and no germline mutations identified.  The CancerNext gene panel offered by [employer] includes sequencing and rearrangement analysis for the following 34 genes:   APC, ATM, BARD1, BMPR1A, BRCA1, BRCA2, BRIP1, CDH1, CDK4, CDKN2A, CHEK2, DICER1, HOXB13, EPCAM, GREM1, MLH1, MRE11A, MSH2, MSH6, MUTYH, NBN, NF1, PALB2, PMS2, POLD1, POLE, PTEN, RAD50, RAD51C, RAD51D, SMAD4, SMARCA4, STK11, and TP53.  The report date is April 06, 2020.   Adenocarcinoma of abdominal wall, unclear primary  08/09/2013  Initial Diagnosis   Adenocarcinoma of abdominal wall, unclear primary   08/16/2013 - 09/25/2013 Chemotherapy   paclitaxel and carboplatin   11/05/2013 Surgery   resection abdominal wall mass    - 02/27/2014 Radiation Therapy   Completed volume directed radiation   01/06/2020 Relapse/Recurrence   Anterior abdominal wall mass biopsy: A. SOFT TISSUE MASS, PELVIC BODY WALL, NEEDLE CORE BIOPSY:  - Metastatic adenocarcinoma, see comment.   COMMENT:   The tumor has malignant cells with cleared cytoplasm.  Immunohistochemistry is positive for cytokeratin 7, PAX8, racemase, and  NapsinA. The cells are negative for ER, WT-1, and CD10. The morphology  and immunophenotype are consistent with metastatic clear cell carcinoma  of gynecologic origin (by definition  high grade). Dr. Benay Spice was  notified on 01/08/20.    02/06/2020 Surgery   Exlap with resection of pelvic tumor densely adherent to the anterior aspect of the pubic arch. Surgery at [facility]  nodular mass palpable, fixed to the pubic arch on exam. On dissection into the mons, an irregularly shaped and nodular tumor was implanted on the pubic arch. No evidence of disease on visualized bowel or omentum. The palpated anterior abdominal wall was free of disease. The bladder dome peritoneum was free of disease. Few soft nodules on the anterior wall peritoneum were ablated using the bovie. No palpable tumor on or surrounding the pubic arch following resection.   02/06/2020 Pathology Results   A: "Pelvic cavity, pubic mons tumor, excision - Clear cell carcinoma, received fragmented, 5.5 cm in aggregate (see comment) - Carcinoma involves multiple cauterized tissue edges  ER negative, PR 1+ (20%)   Metastatic adenocarcinoma of ovary  05/03/2018  Initial Diagnosis   Malignant neoplasm of ovary ([place])   05/21/2018 -  Chemotherapy   The patient had palonosetron (ALOXI) injection 0.25 mg, 0.25 mg, Intravenous,  Once, 6 of 6 cycles Administration: 0.25 mg  (05/21/2018), 0.25 mg (07/27/2018), 0.25 mg (06/11/2018), 0.25 mg (07/06/2018), 0.25 mg (08/17/2018), 0.25 mg (09/07/2018) pegfilgrastim-cbqv (UDENYCA) injection 6 mg, 6 mg, Subcutaneous, Once, 3 of 3 cycles Administration: 6 mg (07/30/2018), 6 mg (09/08/2018) CARBOplatin (PARAPLATIN) 310 mg in sodium chloride 0.9 % 250 mL chemo infusion, 310 mg (100 % of original dose 306.4 mg), Intravenous,  Once, 6 of 6 cycles Dose modification:   (original dose 306.4 mg, Cycle 1),   (original dose 306.4 mg, Cycle 4) Administration: 310 mg (05/21/2018), 300 mg (07/27/2018), 300 mg (06/11/2018), 300 mg (07/06/2018), 300 mg (08/17/2018), 300 mg (09/07/2018) gemcitabine (GEMZAR) 1,368 mg in sodium chloride 0.9 % 250 mL chemo infusion, 800 mg/m2 = 1,368 mg (100 % of original dose 800 mg/m2), Intravenous,  Once, 6 of 6 cycles Dose modification: 800 mg/m2 (original dose 800 mg/m2, Cycle 1, Reason: Provider Judgment) Administration: 1,368 mg (05/21/2018), 1,368 mg (07/27/2018), 1,368 mg (06/11/2018), 1,368 mg (07/06/2018), 1,368 mg (08/17/2018), 1,368 mg (09/07/2018)   for chemotherapy treatment.       Interval History: Last saw Dr. Benay Spice on 7/21.  Patient presents today overall doing well.  She reports good appetite without nausea or emesis.  She denies any abdominal or pelvic pain.  She denies any pain related to hernias.  Reports regular bowel and bladder function.  Denies any vaginal bleeding or discharge.  She continues to use Premarin 3 times a week and thinks that this has helped her vulvar mucosa.  Past Medical/Surgical History: Past Medical History:  Diagnosis Date   Anxiety    new dx   Atrial fibrillation ([place])    Cancer ([place]) 1988, 2015   ovarian, adenocarcinoma   GERD (gastroesophageal reflux disease)    hx of years ago   Hx of radiation therapy 01/16/14-02/27/14   abdomen    Hypercholesteremia    under control with diet and fish oil   Hypertension    Ovarian cyst    PONV (postoperative nausea and vomiting)     Restless leg syndrome    Thyroid disease     Past Surgical History:  Procedure Laterality Date   APPENDECTOMY     APPLICATION OF A-CELL OF CHEST/ABDOMEN N/A 11/05/2013   Procedure: ABDOMINAL WALL RESCONTRUCTION WITH STRATUS;  Surgeon: Irene Limbo, MD;  Location: [facility];  Service: Plastics;  Laterality: N/A;   CHOLECYSTECTOMY  2009   LAPAROTOMY N/A 11/05/2013   Procedure: RESECTION OF PELVIC MASS;  Surgeon: Imagene Gurney A. Alycia Rossetti, MD;  Location: [facility];  Service: Gynecology;  Laterality: N/A;   LAPAROTOMY  03/2018   excision of suprapubic mass at [place]  as child   TOTAL ABDOMINAL HYSTERECTOMY  1988   ovarian cyst  BSO    Family History  Problem Relation Age of Onset   Heart disease Mother    Heart disease Sister    Diabetes Sister    Cancer Sister        melanoma-skin cancer   Breast cancer Paternal 34        Age 8's   Cancer Paternal Grandmother        breast   Cancer Paternal Grandfather        prostate/bladder    Social History   Socioeconomic History  Marital status: Single    Spouse name: Not on file   Number of children: Not on file   Years of education: Not on file   Highest education level: Not on file  Occupational History   Not on file  Tobacco Use   Smoking status: Never   Smokeless tobacco: Never  Vaping Use   Vaping Use: Never used  Substance and Sexual Activity   Alcohol use: No    Alcohol/week: 0.0 standard drinks   Drug use: No   Sexual activity: Never    Birth control/protection: Surgical    Comment: HYST  Other Topics Concern   Not on file  Social History Narrative   [place] married   No children or pets   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 , Disp: , Rfl:    cholecalciferol (VITAMIN D3) 25 MCG (1000 UNIT) tablet, Take 1,000 Units by mouth daily., Disp: , Rfl:    conjugated estrogens (PREMARIN) vaginal cream, Place 1 Applicatorful vaginally 3 (three) times a week. Finger tip amount of cream, applied to vulva and outer vagina at night 3 x a week., Disp: 42.5 g, Rfl: 3   ELIQUIS 5 MG TABS tablet, TAKE (1) TABLET BY MOUTH TWICE DAILY., Disp: 180 tablet, Rfl: 0   HYDROcodone-acetaminophen (NORCO/VICODIN) 5-325 MG tablet, Take 0.5-1 tablets by mouth at bedtime., Disp: , Rfl:    levothyroxine (SYNTHROID) 50 MCG tablet, Take 50 mcg by mouth daily., Disp: , Rfl:    metoprolol tartrate (LOPRESSOR) 25 MG tablet, TAKE (1/2) TABLET BY MOUTH TWICE DAILY., Disp: 90 tablet, Rfl: 3   Multiple Vitamin (MULTIVITAMIN WITH MINERALS) TABS tablet, Take 1 tablet by mouth daily., Disp: , Rfl:    pramipexole (MIRAPEX) 0.125 MG tablet, Take 0.125 mg by mouth 2 (two) times daily., Disp: , Rfl:    Omega-3 Fatty Acids (FISH OIL) 1200 MG CAPS, Take 1,200 mg by mouth daily. (Patient not taking: No sig reported), Disp: , Rfl:  No current facility-administered medications for this visit.  Facility-Administered Medications Ordered in Other Visits:    0.9 %  sodium chloride infusion, , Intravenous, Once, [name] B, MD   0.9 %  sodium chloride infusion, , Intravenous, Once, Ladell Pier, MD  Review of Systems: Denies appetite changes, fevers, chills, fatigue, unexplained weight changes. Denies hearing loss, neck lumps or masses, mouth sores, ringing in ears or voice changes. Denies cough or wheezing.  Denies shortness of breath. Denies chest pain or palpitations. Denies leg swelling. Denies abdominal distention, pain, blood in  stools, constipation, diarrhea, nausea, vomiting, or early satiety. Denies pain with intercourse, dysuria, frequency, hematuria or incontinence. Denies hot flashes, pelvic pain, vaginal bleeding or vaginal discharge.   Denies joint pain, back pain or muscle pain/cramps. Denies itching, rash, or wounds. Denies dizziness, headaches, numbness or seizures. Denies swollen lymph nodes or glands, denies easy bruising or bleeding. Denies anxiety, depression, confusion, or decreased concentration.  Physical Exam: BP 115/63 (BP Location: Left Arm, Patient Position: Sitting)  Pulse (!) 56   Temp 98.1 F (36.7 C) (Oral)   Resp 16   Ht _0  (1.676 m)   Wt 135 lb 12.8 oz (61.6 kg)   SpO2 99%   BMI 21.92 kg/m  General: Alert, oriented, no acute distress. HEENT: Normocephalic, atraumatic, sclera anicteric. Chest: Clear to auscultation bilaterally.  No wheezes or rhonchi. Cardiovascular: Regular rate and rhythm, no murmurs. Abdomen: soft, nontender.  Normoactive bowel sounds.  No masses or hepatosplenomegaly appreciated.  Well-healed incisions. Extremities: Grossly normal range of motion.  Warm, well perfused.  No edema bilaterally. Skin: No rashes or lesions noted. Lymphatics: No cervical, supraclavicular, or inguinal adenopathy. GU: Edema of the mons consistent with known hernias, right much greater than left.  There is some mild erythema between the labia minora and majora on the left, none noted on the right.  Loss of architecture of bilateral labia noted as well as atrophy.  Some petechiae is noted over the inferior aspect of the mons.  Speculum exam not well-tolerated, vaginal mucosa atrophic, no lesions noted.  Bimanual exam with single digit reveals no masses or nodularity.  Rectovaginal exam confirms these findings.  Laboratory & Radiologic Studies: None new  Assessment & Plan: [name] is a 75 y.o. 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   The patient is doing well today and is NED on exam.  She continues to be asymptomatic with regard to bilateral hernias with small bowel noted along the mons and labia, worse on the right.  She had previously seen Dr. Rosendo Gros who recommended expectant management unless she were to become symptomatic.    We will plan to get a CA-125 today.  Her last CT was in July. I would recommend repeat imaging in January. She is scheduled to see Dr. Benay Spice on 1/19. I will have my office work to schedule this several days before.    Patient will continue using vaginal estrogen for vaginal and vulvar atrophy.  In terms of follow-up, we discussed signs and symptoms that should prompt a phone call to see me sooner than her neck scheduled visit.  I will plan to see her back in April next year, which will be 3 months after her visit with Dr. Benay Spice.   34 minutes of total time was spent for this patient encounter, including preparation, face-to-face counseling with the patient and coordination of care, and documentation of the encounter.  Jeral Pinch, MD  Division of Gynecologic Oncology  Department of Obstetrics and Gynecology  [facility]

## 2021-03-02 LAB — CA 125: Cancer Antigen (CA) 125: 22.5 U/mL (ref 0.0–38.1)

## 2021-03-11 DIAGNOSIS — Z23 Encounter for immunization: Secondary | ICD-10-CM | POA: Diagnosis not present

## 2021-03-12 ENCOUNTER — Other Ambulatory Visit: Payer: Self-pay | Admitting: Family Medicine

## 2021-03-12 DIAGNOSIS — Z1231 Encounter for screening mammogram for malignant neoplasm of breast: Secondary | ICD-10-CM

## 2021-03-17 ENCOUNTER — Encounter: Payer: Self-pay | Admitting: *Deleted

## 2021-03-17 ENCOUNTER — Encounter: Payer: Self-pay | Admitting: Cardiology

## 2021-03-17 ENCOUNTER — Ambulatory Visit (INDEPENDENT_AMBULATORY_CARE_PROVIDER_SITE_OTHER): Payer: Medicare Other | Admitting: Cardiology

## 2021-03-17 VITALS — BP 114/70 | HR 56 | Ht 66.5 in | Wt 135.6 lb

## 2021-03-17 DIAGNOSIS — I480 Paroxysmal atrial fibrillation: Secondary | ICD-10-CM | POA: Diagnosis not present

## 2021-03-17 MED ORDER — APIXABAN 5 MG PO TABS: 5.0000 mg | ORAL_TABLET | Freq: Two times a day (BID) | ORAL | 0 refills | Status: DC

## 2021-03-17 NOTE — Progress Notes (Signed)
Clinical Summary [name] is a 75 y.o.femaleseen today for follow up of the following medical problems.        1. Afib - new diagnosis during 12/2014 admission, presented with palpitations and left arm pain - converted back to NSR while admitted, discharged on low dose lopressor, higher doses caused some low heart rates and soft bp's.   - echo LVEF 55-60%, mod LAE.       - no recent palpitations - compliant with meds. No lightheadedness or dizziness.  - no bleeding on eliquis.    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   Past Medical History:  Diagnosis Date   Anxiety    new dx   Atrial fibrillation ([place])    Cancer ([place]) 1988, 2015   ovarian, adenocarcinoma   GERD (gastroesophageal reflux disease)    hx of years ago   Hx of radiation therapy 01/16/14-02/27/14   abdomen    Hypercholesteremia    under control with diet and fish oil   Hypertension    Ovarian cyst    PONV (postoperative nausea and vomiting)    Restless leg syndrome    Thyroid disease      No Known Allergies   Current Outpatient Medications  Medication Sig Dispense Refill   ALPRAZolam (XANAX) 0.25 MG tablet Take 0.25-0.5 mg by mouth at bedtime.      cholecalciferol (VITAMIN D3) 25 MCG (1000 UNIT) tablet Take 1,000 Units by mouth daily.     conjugated estrogens (PREMARIN) vaginal cream Place 1 Applicatorful vaginally 3 (three) times a week. Finger tip amount of cream, applied to vulva and outer vagina at night 3 x a week. 42.5 g 3   ELIQUIS 5 MG TABS tablet TAKE (1) TABLET BY MOUTH TWICE DAILY. 180 tablet 0   HYDROcodone-acetaminophen (NORCO/VICODIN) 5-325 MG tablet Take 0.5-1 tablets by mouth at bedtime.     levothyroxine (SYNTHROID) 50 MCG tablet Take 50 mcg by mouth daily.     metoprolol tartrate (LOPRESSOR) 25 MG tablet TAKE (1/2) TABLET BY MOUTH TWICE DAILY. 90 tablet 3   Multiple  Vitamin (MULTIVITAMIN WITH MINERALS) TABS tablet Take 1 tablet by mouth daily.     Omega-3 Fatty Acids (FISH OIL) 1200 MG CAPS Take 1,200 mg by mouth daily. (Patient not taking: No sig reported)     pramipexole (MIRAPEX) 0.125 MG tablet Take 0.125 mg by mouth 2 (two) times daily.     No current facility-administered medications for this visit.   Facility-Administered Medications Ordered in Other Visits  Medication Dose Route Frequency Provider Last Rate Last Admin   0.9 %  sodium chloride infusion   Intravenous Once Ladell Pier, MD       0.9 %  sodium chloride infusion   Intravenous Once Ladell Pier, MD         Past Surgical History:  Procedure Laterality Date   APPENDECTOMY     APPLICATION OF A-CELL OF CHEST/ABDOMEN N/A 11/05/2013   Procedure: ABDOMINAL WALL RESCONTRUCTION WITH STRATUS;  Surgeon: Irene Limbo, MD;  Location: [facility];  Service: Plastics;  Laterality: N/A;   CHOLECYSTECTOMY  2009   LAPAROTOMY N/A 11/05/2013   Procedure: RESECTION OF PELVIC MASS;  Surgeon: Imagene Gurney A. Alycia Rossetti, MD;  Location: [facility];  Service: Gynecology;  Laterality: N/A;   LAPAROTOMY  03/2018   excision of suprapubic mass at [facility]  BSO   TONSILLECTOMY  as child   TOTAL ABDOMINAL HYSTERECTOMY  1988   ovarian cyst  BSO     No Known Allergies    Family History  Problem Relation Age of Onset   Heart disease Mother    Heart disease Sister    Diabetes Sister    Cancer Sister        melanoma-skin cancer   Breast cancer Paternal 48        Age 47's   Cancer Paternal Grandmother        breast   Cancer Paternal Grandfather        prostate/bladder     Social History [name] reports that she has never smoked. She has never used smokeless tobacco. [name] reports no history of alcohol use.   Review of Systems CONSTITUTIONAL: No weight loss, fever, chills, weakness or fatigue.  HEENT: Eyes: No visual loss, blurred vision, double vision or yellow  sclerae.No hearing loss, sneezing, congestion, runny nose or sore throat.  SKIN: No rash or itching.  CARDIOVASCULAR: per hpi RESPIRATORY: No shortness of breath, cough or sputum.  GASTROINTESTINAL: No anorexia, nausea, vomiting or diarrhea. No abdominal pain or blood.  GENITOURINARY: No burning on urination, no polyuria NEUROLOGICAL: No headache, dizziness, syncope, paralysis, ataxia, numbness or tingling in the extremities. No change in bowel or bladder control.  MUSCULOSKELETAL: No muscle, back pain, joint pain or stiffness.  LYMPHATICS: No enlarged nodes. No history of splenectomy.  PSYCHIATRIC: No history of depression or anxiety.  ENDOCRINOLOGIC: No reports of sweating, cold or heat intolerance. No polyuria or polydipsia.  Marland Kitchen   Physical Examination Today's Vitals   03/17/21 1042  BP: 114/70  Pulse: (!) 56  SpO2: 97%  Weight: 135 lb 9.6 oz (61.5 kg)  Height: 5' 6.5" (1.689 m)   Body mass index is 21.56 kg/m.  Gen: resting comfortably, no acute distress HEENT: no scleral icterus, pupils equal round and reactive, no palptable cervical adenopathy,  CV: RRR, no m/r/g, no jvd Resp: Clear to auscultation bilaterally GI: abdomen is soft, non-tender, non-distended, normal bowel sounds, no hepatosplenomegaly MSK: extremities are warm, no edema.  Skin: warm, no rash Neuro:  no focal deficits Psych: appropriate affect   Diagnostic Studies  12/2014 echo Study Conclusions  - Left ventricle: The cavity size was normal. Wall thickness was   normal. Systolic function was normal. The estimated ejection   fraction was in the range of 55% to 60%. Wall motion was normal;   there were no regional wall motion abnormalities. Left   ventricular diastolic function parameters were normal. - Mitral valve: Mildly calcified annulus. There was mild   regurgitation. - Left atrium: The atrium was moderately dilated. Volume/bsa, S:   37.9 ml/m^2. - Right atrium: The atrium was mildly to  moderately dilated. - Tricuspid valve: There was mild-moderate regurgitation.   Assessment and Plan  1. PAF  - no significant symptoms - continue current meds. 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

## 2021-03-17 NOTE — Addendum Note (Signed)
Addended by: Laurine Blazer on: 03/17/2021 11:34 AM   Modules accepted: Orders

## 2021-03-17 NOTE — Patient Instructions (Signed)

## 2021-03-22 DIAGNOSIS — G4709 Other insomnia: Secondary | ICD-10-CM | POA: Diagnosis not present

## 2021-03-22 DIAGNOSIS — Z6821 Body mass index (BMI) 21.0-21.9, adult: Secondary | ICD-10-CM | POA: Diagnosis not present

## 2021-03-22 DIAGNOSIS — Z0000 Encounter for general adult medical examination without abnormal findings: Secondary | ICD-10-CM | POA: Diagnosis not present

## 2021-03-22 DIAGNOSIS — Z1331 Encounter for screening for depression: Secondary | ICD-10-CM | POA: Diagnosis not present

## 2021-03-22 DIAGNOSIS — G894 Chronic pain syndrome: Secondary | ICD-10-CM | POA: Diagnosis not present

## 2021-03-22 DIAGNOSIS — C482 Malignant neoplasm of peritoneum, unspecified: Secondary | ICD-10-CM | POA: Diagnosis not present

## 2021-03-22 DIAGNOSIS — F419 Anxiety disorder, unspecified: Secondary | ICD-10-CM | POA: Diagnosis not present

## 2021-04-02 ENCOUNTER — Other Ambulatory Visit: Payer: Self-pay | Admitting: Nurse Practitioner

## 2021-04-06 DIAGNOSIS — E039 Hypothyroidism, unspecified: Secondary | ICD-10-CM | POA: Diagnosis not present

## 2021-04-06 DIAGNOSIS — E785 Hyperlipidemia, unspecified: Secondary | ICD-10-CM | POA: Diagnosis not present

## 2021-04-06 DIAGNOSIS — D509 Iron deficiency anemia, unspecified: Secondary | ICD-10-CM | POA: Diagnosis not present

## 2021-04-06 DIAGNOSIS — R739 Hyperglycemia, unspecified: Secondary | ICD-10-CM | POA: Diagnosis not present

## 2021-04-14 ENCOUNTER — Other Ambulatory Visit: Payer: Self-pay | Admitting: Cardiology

## 2021-04-14 NOTE — Telephone Encounter (Signed)
Prescription refill request for Eliquis received. Indication: PAF Last office visit: 03/17/21  Zandra Abts MD Scr: 0.80 on 11/26/20 Age: 75 Weight: 61.5kg  Based on above findings Eliquis 5mg  twice daily is the appropriate dose.  Refill approved.

## 2021-04-16 ENCOUNTER — Ambulatory Visit
Admission: RE | Admit: 2021-04-16 | Discharge: 2021-04-16 | Disposition: A | Discharge status: Home / Self Care | Payer: Medicare Other | Source: Ambulatory Visit | Attending: Family Medicine | Admitting: Family Medicine

## 2021-04-16 DIAGNOSIS — Z1231 Encounter for screening mammogram for malignant neoplasm of breast: Secondary | ICD-10-CM | POA: Diagnosis not present

## 2021-05-24 ENCOUNTER — Other Ambulatory Visit: Payer: Self-pay | Admitting: *Deleted

## 2021-05-24 DIAGNOSIS — C569 Malignant neoplasm of unspecified ovary: Secondary | ICD-10-CM

## 2021-05-25 ENCOUNTER — Inpatient Hospital Stay: Payer: PPO | Attending: Gynecologic Oncology

## 2021-05-25 ENCOUNTER — Other Ambulatory Visit: Payer: Self-pay

## 2021-05-25 DIAGNOSIS — Z87440 Personal history of urinary (tract) infections: Secondary | ICD-10-CM | POA: Insufficient documentation

## 2021-05-25 DIAGNOSIS — G2581 Restless legs syndrome: Secondary | ICD-10-CM | POA: Insufficient documentation

## 2021-05-25 DIAGNOSIS — I4891 Unspecified atrial fibrillation: Secondary | ICD-10-CM | POA: Insufficient documentation

## 2021-05-25 DIAGNOSIS — C801 Malignant (primary) neoplasm, unspecified: Secondary | ICD-10-CM

## 2021-05-25 DIAGNOSIS — Z8543 Personal history of malignant neoplasm of ovary: Secondary | ICD-10-CM | POA: Diagnosis not present

## 2021-05-25 DIAGNOSIS — C569 Malignant neoplasm of unspecified ovary: Secondary | ICD-10-CM

## 2021-05-25 DIAGNOSIS — C577 Malignant neoplasm of other specified female genital organs: Secondary | ICD-10-CM

## 2021-05-25 LAB — CMP (CANCER CENTER ONLY)
ALT: 31 U/L (ref 0–44)
AST: 30 U/L (ref 15–41)
Albumin: 4 g/dL (ref 3.5–5.0)
Alkaline Phosphatase: 49 U/L (ref 38–126)
Anion gap: 5 (ref 5–15)
BUN: 21 mg/dL (ref 8–23)
CO2: 31 mmol/L (ref 22–32)
Calcium: 10.1 mg/dL (ref 8.9–10.3)
Chloride: 106 mmol/L (ref 98–111)
Creatinine: 0.89 mg/dL (ref 0.44–1.00)
GFR, Estimated: >60 mL/min (ref >60)
Glucose, Bld: 110 mg/dL — ABNORMAL HIGH (ref 70–99)
Potassium: 4.7 mmol/L (ref 3.5–5.1)
Sodium: 142 mmol/L (ref 135–145)
Total Bilirubin: 0.3 mg/dL (ref 0.3–1.2)
Total Protein: 6.6 g/dL (ref 6.5–8.1)

## 2021-05-26 LAB — CA 125: Cancer Antigen (CA) 125: 22.7 U/mL (ref 0.0–38.1)

## 2021-05-27 ENCOUNTER — Telehealth: Payer: Self-pay | Admitting: Gynecologic Oncology

## 2021-05-27 ENCOUNTER — Other Ambulatory Visit: Payer: Self-pay

## 2021-05-27 ENCOUNTER — Ambulatory Visit (HOSPITAL_COMMUNITY)
Admission: RE | Admit: 2021-05-27 | Discharge: 2021-05-27 | Disposition: A | Discharge status: Home / Self Care | Payer: PPO | Source: Ambulatory Visit | Attending: Gynecologic Oncology | Admitting: Gynecologic Oncology

## 2021-05-27 DIAGNOSIS — I700 Atherosclerosis of aorta: Secondary | ICD-10-CM | POA: Diagnosis not present

## 2021-05-27 DIAGNOSIS — C577 Malignant neoplasm of other specified female genital organs: Secondary | ICD-10-CM | POA: Insufficient documentation

## 2021-05-27 DIAGNOSIS — K5730 Diverticulosis of large intestine without perforation or abscess without bleeding: Secondary | ICD-10-CM | POA: Diagnosis not present

## 2021-05-27 DIAGNOSIS — C801 Malignant (primary) neoplasm, unspecified: Secondary | ICD-10-CM | POA: Insufficient documentation

## 2021-05-27 DIAGNOSIS — N281 Cyst of kidney, acquired: Secondary | ICD-10-CM | POA: Diagnosis not present

## 2021-05-27 MED ORDER — SODIUM CHLORIDE (PF) 0.9 % IJ SOLN
INTRAMUSCULAR | Status: AC
  Filled 2021-05-27: qty 50

## 2021-05-27 MED ORDER — IOHEXOL 350 MG/ML SOLN
100.0000 mL | Freq: Once | INTRAVENOUS | Status: AC | PRN
  Administered 2021-05-27: 100 mL via INTRAVENOUS

## 2021-05-27 MED ORDER — IOHEXOL 300 MG/ML  SOLN: 100.0000 mL | Freq: Once | INTRAMUSCULAR | Status: DC | PRN

## 2021-05-27 NOTE — Telephone Encounter (Signed)
Called patient and discussed CT results - no evidence of cancer. Discussed large stool burden. She denies any pain or constipation.   Jeral Pinch MD Gynecologic Oncology

## 2021-06-03 ENCOUNTER — Inpatient Hospital Stay: Payer: PPO

## 2021-06-03 ENCOUNTER — Inpatient Hospital Stay: Payer: PPO | Admitting: Oncology

## 2021-06-03 ENCOUNTER — Other Ambulatory Visit: Payer: Self-pay

## 2021-06-03 VITALS — BP 131/68 | HR 63 | Temp 98.1°F | Resp 20 | Ht 66.0 in | Wt 135.4 lb

## 2021-06-03 DIAGNOSIS — C569 Malignant neoplasm of unspecified ovary: Secondary | ICD-10-CM | POA: Diagnosis not present

## 2021-06-03 DIAGNOSIS — Z8543 Personal history of malignant neoplasm of ovary: Secondary | ICD-10-CM | POA: Diagnosis not present

## 2021-06-03 NOTE — Progress Notes (Signed)
[place] OFFICE PROGRESS NOTE   Diagnosis: Adenocarcinoma  INTERVAL HISTORY:   [name] returns as scheduled.  She feels well.  No new complaint.  She saw Dr. Berline Lopes in October.  Objective:  Vital signs in last 24 hours:  Blood pressure 131/68, pulse 63, temperature 98.1 F (36.7 C), temperature source Oral, resp. rate 20, height 5' 6"  (1.676 m), weight 135 lb 6.4 oz (61.4 kg), SpO2 100 %.    Lymphatics: No cervical, supraclavicular, left axillary, or inguinal nodes.  Prominent right axillary fat pad versus a soft mobile 1 cm node. Resp: Lungs clear bilaterally Cardio: Regular rate and rhythm GI: No hepatosplenomegaly, no apparent ascites, no mass, no evidence for recurrent tumor at the low transverse scars Vascular: No leg edema   Lab Results:  Lab Results  Component Value Date   WBC 6.2 05/06/2020   HGB 12.3 05/06/2020   HCT 36.1 05/06/2020   MCV 95.8 05/06/2020   PLT 209 05/06/2020   NEUTROABS 4.0 05/06/2020    CMP  Lab Results  Component Value Date   NA 142 05/25/2021   K 4.7 05/25/2021   CL 106 05/25/2021   CO2 31 05/25/2021   GLUCOSE 110 (H) 05/25/2021   BUN 21 05/25/2021   CREATININE 0.89 05/25/2021   CALCIUM 10.1 05/25/2021   PROT 6.6 05/25/2021   ALBUMIN 4.0 05/25/2021   AST 30 05/25/2021   ALT 31 05/25/2021   ALKPHOS 49 05/25/2021   BILITOT 0.3 05/25/2021   GFRNONAA >60 05/25/2021   GFRAA >60 12/25/2019    Lab Results  Component Value Date   CA125 10 03/17/2016    Medications: I have reviewed the patient's current medications.   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   Elevated CA 125.   Cycle 1 Taxol/carboplatin 08/16/2013.   Cycle 2 Taxol/carboplatin 09/06/2013.   Cycle 3 Taxol/carboplatin 09/27/2013   CT 10/11/2013 with a stable abdominal wall  mass. Status post resection of abdominal wall mass with abdominal wall reconstruction 11/05/2013. Final pathology showed adenocarcinoma. Specimen extensively involved with adenocarcinoma which focally involved the edge of the specimen. Immunostains and Foundation 1 testing were nonspecific for a primary tumor location. Adjuvant radiation to the abdominal wall 01/16/2014 through 02/27/2014 CT 01/15/2018 while in the emergency room for evaluation of back pain- midline abdominal wall mass above the pubic symphysis and adjacent to the bladder Ultrasound-guided biopsy of the abdominal wall mass 01/25/2018-adenocarcinoma similar to the 2015 biopsy PET scan 03/01/2018-hypermetabolism associated with a cystic and solid suprapubic mass.  Focal uptake identified in the left L3-4 facets without underlying bony lesion.  No other sites of unexpected or suspicious hypermetabolism in the neck, chest, abdomen or pelvis. Exploratory laparotomy, resection of abdominal wall tumor, oversew of bladder peritoneum, cystoscopy 03/28/2018 (findings included a cystic and solid mass encountered below the lower edge of the incision extending into the space of retzius and adherent to the pubic arch inferiorly, bladder dome posteriorly and prior abdominal wall repair anteriorly.  No nodularity palpated in the tumor bed.  Visualized bowel and omentum free of disease.  Pelvic peritoneum appeared normal).  A "small "amount of tumor on the periosteum was cauterized using a Bovie. Pathology on pelvic tumor-metastatic high-grade adenocarcinoma, favor clear-cell carcinoma of gynecologic origin, fragments up to 5.8 cm in size; ER negative, PR positive, 1+ (weak staining) to focally 2+, 30-40%,  MSI-stable, mutation burden-4,CREBBP alteration Cycle 1 gemcitabine/carboplatin 05/21/2018; day 8 held due to neutropenia Cycle 2 gemcitabine/carboplatin 06/11/2018 (day 8 gemcitabine eliminated) Cycle 3 gemcitabine/carboplatin 07/06/2018 Cycle 4  gemcitabine/carboplatin 07/27/2018 (Udenyca added) Cycle 5 gemcitabine/carboplatin 08/17/2018 Cycle 6 gemcitabine/carboplatin 09/07/2018  CT 10/19/2018- low anterior abdominal wall mass no longer identified, soft tissue thickening cephalad to the site of prior mass-nonspecific, no additional evidence of metastatic disease CT 04/18/2019-no evidence of recurrent or metastatic disease CT 12/23/2019-slight increase in size of ventral midline soft tissue mass near the pubis, no other evidence of disease progression Biopsy pelvic body wall soft tissue mass 01/06/2020-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 Surgical excision of the soft tissue mass adherent to the pubis 02/06/2020-clear cell carcinoma, positive surgical margins Adjuvant radiation 03/25/2020- 04/29/2020 CT abdomen/pelvis 05/19/2020- resolution of suprapubic abdominal wall mass, no evidence of metastatic disease, moderate right and small left hernias CT abdomen/pelvis 11/26/2020-no recurrence of the lower pelvic soft tissue mass, small bilateral lower pelvic hernias, no evidence of recurrent or metastatic disease CT abdomen/pelvis 05/27/2021-no evidence of recurrent disease Ovarian cancer in 1988, low-grade adenocarcinoma with areas of "borderline" carcinoma, status post a hysterectomy and bilateral oophorectomy. New onset atrial fibrillation August 2016, maintained on eliquis Recurrent urinary tract infections Restless legs      Disposition: [name] is in remission from the gynecologic malignancy.  The CA125 is normal and the restaging CT last week reveals no evidence of recurrent disease.  She will see Dr. Berline Lopes in April.  She will return for an office visit and CA125 in 6 months.  [name], MD  06/03/2021  11:25 AM

## 2021-06-07 DIAGNOSIS — M1991 Primary osteoarthritis, unspecified site: Secondary | ICD-10-CM | POA: Diagnosis not present

## 2021-06-07 DIAGNOSIS — Z6821 Body mass index (BMI) 21.0-21.9, adult: Secondary | ICD-10-CM | POA: Diagnosis not present

## 2021-06-07 DIAGNOSIS — G894 Chronic pain syndrome: Secondary | ICD-10-CM | POA: Diagnosis not present

## 2021-06-07 DIAGNOSIS — C482 Malignant neoplasm of peritoneum, unspecified: Secondary | ICD-10-CM | POA: Diagnosis not present

## 2021-06-11 ENCOUNTER — Telehealth: Payer: Self-pay | Admitting: *Deleted

## 2021-06-11 NOTE — Telephone Encounter (Signed)
Spoke with the patient and scheduled a follow up appt on 4/17 per Dr Berline Lopes

## 2021-06-15 DIAGNOSIS — L718 Other rosacea: Secondary | ICD-10-CM | POA: Diagnosis not present

## 2021-06-15 DIAGNOSIS — L821 Other seborrheic keratosis: Secondary | ICD-10-CM | POA: Diagnosis not present

## 2021-06-15 DIAGNOSIS — D225 Melanocytic nevi of trunk: Secondary | ICD-10-CM | POA: Diagnosis not present

## 2021-06-15 DIAGNOSIS — K141 Geographic tongue: Secondary | ICD-10-CM | POA: Diagnosis not present

## 2021-06-15 DIAGNOSIS — L814 Other melanin hyperpigmentation: Secondary | ICD-10-CM | POA: Diagnosis not present

## 2021-07-21 DIAGNOSIS — Z6821 Body mass index (BMI) 21.0-21.9, adult: Secondary | ICD-10-CM | POA: Diagnosis not present

## 2021-07-21 DIAGNOSIS — C482 Malignant neoplasm of peritoneum, unspecified: Secondary | ICD-10-CM | POA: Diagnosis not present

## 2021-07-21 DIAGNOSIS — E039 Hypothyroidism, unspecified: Secondary | ICD-10-CM | POA: Diagnosis not present

## 2021-07-21 DIAGNOSIS — F419 Anxiety disorder, unspecified: Secondary | ICD-10-CM | POA: Diagnosis not present

## 2021-07-21 DIAGNOSIS — E782 Mixed hyperlipidemia: Secondary | ICD-10-CM | POA: Diagnosis not present

## 2021-08-30 ENCOUNTER — Inpatient Hospital Stay: Payer: PPO | Attending: Gynecologic Oncology | Admitting: Gynecologic Oncology

## 2021-08-30 ENCOUNTER — Other Ambulatory Visit: Payer: Self-pay

## 2021-08-30 ENCOUNTER — Encounter: Payer: Self-pay | Admitting: Gynecologic Oncology

## 2021-08-30 ENCOUNTER — Inpatient Hospital Stay: Payer: PPO

## 2021-08-30 VITALS — BP 131/63 | HR 53 | Temp 98.1°F | Resp 18 | Ht 65.98 in | Wt 136.8 lb

## 2021-08-30 DIAGNOSIS — C801 Malignant (primary) neoplasm, unspecified: Secondary | ICD-10-CM

## 2021-08-30 DIAGNOSIS — Z923 Personal history of irradiation: Secondary | ICD-10-CM | POA: Insufficient documentation

## 2021-08-30 DIAGNOSIS — Z79890 Hormone replacement therapy (postmenopausal): Secondary | ICD-10-CM | POA: Diagnosis not present

## 2021-08-30 DIAGNOSIS — K432 Incisional hernia without obstruction or gangrene: Secondary | ICD-10-CM | POA: Insufficient documentation

## 2021-08-30 DIAGNOSIS — Z8543 Personal history of malignant neoplasm of ovary: Secondary | ICD-10-CM | POA: Diagnosis not present

## 2021-08-30 DIAGNOSIS — Z9221 Personal history of antineoplastic chemotherapy: Secondary | ICD-10-CM | POA: Insufficient documentation

## 2021-08-30 NOTE — Patient Instructions (Addendum)
It was good to see you today.  I will have someone from the office call with your CA-125 results when back. ? ?I will see you in 6 months.  We will plan to get a CT scan in October.  If anything changes between now and your visit with me, please call to see me sooner.  Please call sometime in August to get your visit scheduled with me in October. ?

## 2021-08-30 NOTE — Progress Notes (Signed)
Gynecologic Oncology Return Clinic Visit ? ?08/30/2021 ? 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 ?Treatment History: ?Oncology History Overview Note  ?History of "low grade ovarian cancer" diagnosed incidentally on hysterectomy in 1988. Pathology report says "focal well differentiated serous cystadenocarcinoma". The comment is that the adenocarcinomatous component is limited to rare foci of small invasive glandular structures. Another report states that there is a small focus of papillary adenocarcinoma in a serous cystadenofibroma of the left ovary. Uterus, cervix, left fallopian tube, right adnexa and pelvic washings all without evidence of malignancy. Appendix also removed at the time of surgery. ? ?Her CA-125 at that time was reported to be 33. She did not receive adjuvant therapy for her initial cancer diagnosis. ? ?She presented in 07/2013 with abdominal pain and mass was noted along right inferior rectus muscle. Biopsy of the mass showed adenocarcinoma of unknown primary. She received 3 cycles of neoadjuvant chemotherapy (no response), carboplatin/taxol. After resection, she underwent RT due to positie tumor margin. This was completed in 02/2014. ? ?CA-125: ?08/07/13: 128 ?09/27/13: 147 ?12/20/13: 12.9 ?03/28/14: 9 ?07/31/14: 8 ?01/2015: 8 ?07/2015: 17.3 ?11/11/15: 16.9 ?09/2016: 16.9 ?02/2017: 19.4 ?08/2017: 19.2 ?01/2018: 19.2 ?02/2018: 19.5 ?08/2018: 19.2 ?01/2019: 18.1 ?04/2019: 19.5 ?08/22/19: 18.6 ?12/23/19: 24.4 ?  ?Neoplasm of abdominal wall of uncertain behavior  ?07/19/2013 Imaging  ? Ct A/P: ?1. Lower abdominal/suprapubic ventral wall lobulated soft tissue  ?mass, arising within the lower rectus musculature more so on the  ?right. 5.5 x 7.9 x 9.7 cm. Favor sarcoma or other connective tissue  ?tumor. This should be amenable to percutaneous biopsy. In a younger  ?female patient, endometriosis within a Cesarean section scar would  ?also be a consideration for  this appearance.  ?2. Narrow fat plane between the mass in #1 and underlying small  ?bowel and bladder. No lymphadenopathy. No ascites.  ?3. Surgically absent gallbladder, uterus, and adnexa. Diverticulosis  ?of the colon.  ?Study discussed by telephone with PA BENJAMIN MANN on 07/19/2013 at  ?11:35 .  ?  ?07/23/2013 Initial Diagnosis  ? Neoplasm of abdominal wall of uncertain behavior ? ?  ?07/30/2013 Initial Biopsy  ? Soft Tissue Needle Core Biopsy, abdominal wall mass ?- METASTATIC ADENOCARCINOMA INVOLVING SOFT TISSUE, SEE COMMENT. ?Microscopic Comment ?Needle core biopsies demonstrate diffuse involvement by well differentiated adenocarcinoma. The ?adenocarcinoma has the following immunophenotype: ?Cytokeratin 7 - strong diffuse expression ?Cytokeratin 20 - negative expression ?CDX2 - negative expression ?TTF-1 - negative expression ?Estrogen receptor - negative expression ?Vimentin - patchy mild to moderate expression ?  ?08/16/2013 - 09/27/2013 Chemotherapy  ? 3 cycle of carboplatin, paclitaxel  ?  ?10/11/2013 Imaging  ? CT A/P: ?A heterogeneous mass centered in the inferior right rectus abdominus  ?muscle measures 5.3 x 7.5 cm (previously 5.5 x 7.9 cm). No  ?pathologically enlarged lymph nodes. Scattered atherosclerotic  ?calcification of the arterial vasculature without abdominal aortic  ?aneurysm. No free fluid. No worrisome lytic or sclerotic lesions.  ?Degenerative changes are seen in the spine.  ? ?IMPRESSION:  ?Right rectus abdominus mass is stable to very minimally smaller than  ?on 07/19/2013. ?  ?11/05/2013 Surgery  ? Resection abdominal wall mass, abdominal wall reconstruction with Strattice matrix (10x11cm) ? ?Operative findings: 8 involving the subcutaneous tissues, fascia, and rectus on the right side. No obvious intraperitoneal disease. Normal-appearing omentum ?  ?11/05/2013 Pathology Results  ? Soft tissue mass, simple excision, abdominal wall mass ?ADENOCARCINOMA. ?Microscopic Comment ?The specimen is  extensively  involved by adenocarcinoma which focally involves the edge of the specimen. ?Immunohistochemistry is performed and the tumor shows patchy positivity with Napsin-A and is negative with WT-1, estrogen receptor, progesterone receptor, gross disease cystic fluid protein, CDX-2, carcinoembryonic antigen, thyroid transcription factor-1, CD10 and CD117. The immunophenotype is nonspecific and additional ?immunohistochemistry will be performed and reported as an addendum. (JDP:kh 11/07/13) ?ADDENDUM: Additional immunohistochemistry is performed and the tumor is positive with cytokeratin AE1/AE3 and cytokeratin 7 and is negative with Calretinin, cytokeratin 5/6 and cytokeratin 20. The immunoreactivity is not specific for location of a primary tumor. (JDP:kh 11-08-13) ?  ?11/05/2013 Genetic Testing  ? Foundation One testing ?MYC amplification - equivocal ?CREBBP Q1765* ?No FDA approved therapies in patient's tumor type or another tumor type ? ?  ?01/16/2014 - 02/27/2014 Radiation Therapy  ? 45 gray to the target region within the abdominal wall postoperative area. She received 45 gray using a 4 field 3-D conformal technique at 1.8 gray per fraction. The patient then received a boost using a cone down 4 field technique for an additional 9 Gray. The final dose was 54 gray. ?  ?01/29/2018 Pathology Results  ? Soft Tissue Needle Core Biopsy, ant abd wall mass ?- ADENOCARCINOMA. ?- SEE MICROSCOPIC DESCRIPTION. ?Microscopic Comment ?The core biopsies consist of abundant dense, hyalinized fibrous tissue with scattered glands with cytologic atypia consistent with adenocarcinoma. Immunohistochemistry shows the tumor is positive with cytokeratin AE1/AE3 and cytokeratin 7 and is negative with cytokeratin 5/6, Calretinin, WT-1, estrogen receptor, progesterone receptor and ?cytokeratin 20. The morphology and immunophenotype are similar to the morphology in the abdominal wall mass excision from 11/07/2013 [phone]). ?  ?03/01/2018  Imaging  ? PET: ?1. Hypermetabolism associated with the cystic and solid suprapubic ?mass compatible with the reported clinical history of adenocarcinoma ?recurrence. Cystic components in the cranial aspect of the lesion ?are largely devoid of FDG accumulation with the most hypermetabolic ?tissue being inferiorly, immediately adjacent to the symphysis ?pubis. ?2. Focal uptake identified in the left L3-4 facets without ?underlying bony lesion. This is presumed to represent uptake related ?to degenerative change. ?3. No other sites of unexpected or suspicious hypermetabolism in the ?neck, chest, abdomen, or pelvis ?  ?03/02/2018 Imaging  ? CT A/P: ?6.7 cm mass in the lower anterior abdominal wall containing cystic and solid components, suspicious for recurrence of previously resected/treated adenocarcinoma in this region. This abuts and could invade the anterior wall of the bladder.   ?No evidence for more distant metastatic disease within the abdomen or pelvis.  ?  ?03/28/2018 Surgery  ? Exploratory laparotomy, resection abdominal wall tumor, oversew of bladder peritoneum, cystoscopy. ? ?Findings: normal external genitalia, unable to palpate mass on exam. After opening the fascia, a cystic and solid mass was encountered below the lower edge of the incision, extending into the space of retzius and adherent to the pubic arch inferiorly, the bladder dome posteriorly, and prior abdominal wall repair anteriorly. At conclusion of the case, no nodularity was palpated in the tumor bed. Visualized bowel and omentum were free of disease. Pelvic peritoenum appeared normal. Cystoscopy revealed intact bladder mucosa with no visible suture. Bilateral ureteral orfices visualized. ?  ?03/28/2018 Pathology Results  ? 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 Additional immunohistochemical studies performed  on block A5 in the current specimen demonstrates that

## 2021-08-31 LAB — CA 125: Cancer Antigen (CA) 125: 21.4 U/mL (ref 0.0–38.1)

## 2021-09-01 ENCOUNTER — Telehealth: Payer: Self-pay | Admitting: Oncology

## 2021-09-01 NOTE — Telephone Encounter (Signed)
Called [facility] and advised her that her CA 125 results were normal and similar to what it has been.  She verbalized understanding and agreement. ?

## 2021-09-08 ENCOUNTER — Other Ambulatory Visit: Payer: Self-pay | Admitting: Cardiology

## 2021-09-29 DIAGNOSIS — Z6821 Body mass index (BMI) 21.0-21.9, adult: Secondary | ICD-10-CM | POA: Diagnosis not present

## 2021-09-29 DIAGNOSIS — F419 Anxiety disorder, unspecified: Secondary | ICD-10-CM | POA: Diagnosis not present

## 2021-10-13 ENCOUNTER — Other Ambulatory Visit: Payer: Self-pay | Admitting: Cardiology

## 2021-10-13 NOTE — Telephone Encounter (Signed)
Prescription refill request for Eliquis received. Indication: PAF Last office visit: 03/17/21  Zandra Abts MD Scr: 0.89 on 05/25/21 Age: 76 Weight: 61.5kg  Based on above findings Eliquis 5mg  twice daily is the appropriate dose.  Refill approved.

## 2021-10-19 ENCOUNTER — Telehealth: Payer: Self-pay | Admitting: *Deleted

## 2021-10-19 NOTE — Telephone Encounter (Signed)
Pt was wondering if she can get a refill on the conjugated estrogen cream prescribed by Dr.Tucker. Informed her we will let her know and reach back out to her. Pt verbalized understanding.

## 2021-10-20 ENCOUNTER — Other Ambulatory Visit: Payer: Self-pay | Admitting: Gynecologic Oncology

## 2021-10-20 DIAGNOSIS — N952 Postmenopausal atrophic vaginitis: Secondary | ICD-10-CM

## 2021-10-20 DIAGNOSIS — C801 Malignant (primary) neoplasm, unspecified: Secondary | ICD-10-CM

## 2021-10-20 MED ORDER — ESTROGENS CONJUGATED 0.625 MG/GM VA CREA: TOPICAL_CREAM | VAGINAL | 6 refills | Status: DC

## 2021-10-20 NOTE — Telephone Encounter (Signed)
Pt aware refill of the estrogen cream has been sent to her pharmacy.

## 2021-10-20 NOTE — Progress Notes (Signed)
Refill of premarin sent in per pt request. Per Dr. Charisse March last note, patient is to continue premarin cream for vulvar and vaginal atrophy.

## 2021-11-17 DIAGNOSIS — Z6821 Body mass index (BMI) 21.0-21.9, adult: Secondary | ICD-10-CM | POA: Diagnosis not present

## 2021-11-17 DIAGNOSIS — N390 Urinary tract infection, site not specified: Secondary | ICD-10-CM | POA: Diagnosis not present

## 2021-12-02 ENCOUNTER — Inpatient Hospital Stay: Payer: PPO

## 2021-12-02 ENCOUNTER — Inpatient Hospital Stay: Payer: PPO | Attending: Gynecologic Oncology | Admitting: Oncology

## 2021-12-02 VITALS — BP 109/58 | HR 61 | Temp 98.3°F | Resp 20 | Wt 137.8 lb

## 2021-12-02 DIAGNOSIS — I4891 Unspecified atrial fibrillation: Secondary | ICD-10-CM | POA: Insufficient documentation

## 2021-12-02 DIAGNOSIS — C569 Malignant neoplasm of unspecified ovary: Secondary | ICD-10-CM

## 2021-12-02 DIAGNOSIS — Z923 Personal history of irradiation: Secondary | ICD-10-CM | POA: Diagnosis not present

## 2021-12-02 DIAGNOSIS — Z8543 Personal history of malignant neoplasm of ovary: Secondary | ICD-10-CM | POA: Diagnosis not present

## 2021-12-02 DIAGNOSIS — K432 Incisional hernia without obstruction or gangrene: Secondary | ICD-10-CM | POA: Insufficient documentation

## 2021-12-02 DIAGNOSIS — Z9221 Personal history of antineoplastic chemotherapy: Secondary | ICD-10-CM | POA: Insufficient documentation

## 2021-12-02 DIAGNOSIS — Z7901 Long term (current) use of anticoagulants: Secondary | ICD-10-CM | POA: Diagnosis not present

## 2021-12-02 DIAGNOSIS — Z87442 Personal history of urinary calculi: Secondary | ICD-10-CM | POA: Diagnosis not present

## 2021-12-02 DIAGNOSIS — G2581 Restless legs syndrome: Secondary | ICD-10-CM | POA: Insufficient documentation

## 2021-12-02 NOTE — Progress Notes (Signed)
[name]   Diagnosis: Ovarian cancer  INTERVAL HISTORY:   [name] returns as scheduled.  She feels well.  She had a recent urinary tract infection treated with antibiotics.  No new complaint.  She continues to have incisional hernias.  Objective:  Vital signs in last 24 hours:  Blood pressure (!) 109/58, pulse 61, temperature 98.3 F (36.8 C), temperature source Oral, resp. rate 20, weight 137 lb 12.8 oz (62.5 kg), SpO2 98 %.    Lymphatics: No cervical, supraclavicular, axillary, or inguinal nodes Resp: Lungs clear bilaterally Cardio: Regular rate and rhythm GI: No hepatosplenomegaly, no mass, nontender, no evidence of recurrent tumor at the low transverse scars.  Soft hernias at the low abdomen and groin bilaterally Vascular: No leg edema   Lab Results:  Lab Results  Component Value Date   WBC 6.2 05/06/2020   HGB 12.3 05/06/2020   HCT 36.1 05/06/2020   MCV 95.8 05/06/2020   PLT 209 05/06/2020   NEUTROABS 4.0 05/06/2020    CMP  Lab Results  Component Value Date   NA 142 05/25/2021   K 4.7 05/25/2021   CL 106 05/25/2021   CO2 31 05/25/2021   GLUCOSE 110 (H) 05/25/2021   BUN 21 05/25/2021   CREATININE 0.89 05/25/2021   CALCIUM 10.1 05/25/2021   PROT 6.6 05/25/2021   ALBUMIN 4.0 05/25/2021   AST 30 05/25/2021   ALT 31 05/25/2021   ALKPHOS 49 05/25/2021   BILITOT 0.3 05/25/2021   GFRNONAA >60 05/25/2021   GFRAA >60 12/25/2019    Lab Results  Component Value Date   CA125 10 03/17/2016     Medications: I have reviewed the patient's current medications.   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   Elevated CA 125.   Cycle 1 Taxol/carboplatin 08/16/2013.   Cycle 2 Taxol/carboplatin 09/06/2013.   Cycle 3 Taxol/carboplatin 09/27/2013   CT 10/11/2013 with a  stable abdominal wall mass. Status post resection of abdominal wall mass with abdominal wall reconstruction 11/05/2013. Final pathology showed adenocarcinoma. Specimen extensively involved with adenocarcinoma which focally involved the edge of the specimen. Immunostains and Foundation 1 testing were nonspecific for a primary tumor location. Adjuvant radiation to the abdominal wall 01/16/2014 through 02/27/2014 CT 01/15/2018 while in the emergency room for evaluation of back pain- midline abdominal wall mass above the pubic symphysis and adjacent to the bladder Ultrasound-guided biopsy of the abdominal wall mass 01/25/2018-adenocarcinoma similar to the 2015 biopsy PET scan 03/01/2018-hypermetabolism associated with a cystic and solid suprapubic mass.  Focal uptake identified in the left L3-4 facets without underlying bony lesion.  No other sites of unexpected or suspicious hypermetabolism in the neck, chest, abdomen or pelvis. Exploratory laparotomy, resection of abdominal wall tumor, oversew of bladder peritoneum, cystoscopy 03/28/2018 (findings included a cystic and solid mass encountered below the lower edge of the incision extending into the space of retzius and adherent to the pubic arch inferiorly, bladder dome posteriorly and prior abdominal wall repair anteriorly.  No nodularity palpated in the tumor bed.  Visualized bowel and omentum free of disease.  Pelvic peritoneum appeared normal).  A "small "amount of tumor on the periosteum was cauterized using a Bovie. Pathology on pelvic tumor-metastatic high-grade adenocarcinoma, favor clear-cell carcinoma of gynecologic origin, fragments up to 5.8 cm in size; ER negative, PR positive, 1+ (weak staining) to focally  2+, 30-40%, MSI-stable, mutation burden-4,CREBBP alteration Cycle 1 gemcitabine/carboplatin 05/21/2018; day 8 held due to neutropenia Cycle 2 gemcitabine/carboplatin 06/11/2018 (day 8 gemcitabine eliminated) Cycle 3 gemcitabine/carboplatin  07/06/2018 Cycle 4 gemcitabine/carboplatin 07/27/2018 (Udenyca added) Cycle 5 gemcitabine/carboplatin 08/17/2018 Cycle 6 gemcitabine/carboplatin 09/07/2018  CT 10/19/2018- low anterior abdominal wall mass no longer identified, soft tissue thickening cephalad to the site of prior mass-nonspecific, no additional evidence of metastatic disease CT 04/18/2019-no evidence of recurrent or metastatic disease CT 12/23/2019-slight increase in size of ventral midline soft tissue mass near the pubis, no other evidence of disease progression Biopsy pelvic body wall soft tissue mass 01/06/2020-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 Surgical excision of the soft tissue mass adherent to the pubis 02/06/2020-clear cell carcinoma, positive surgical margins Adjuvant radiation 03/25/2020- 04/29/2020 CT abdomen/pelvis 05/19/2020- resolution of suprapubic abdominal wall mass, no evidence of metastatic disease, moderate right and small left hernias CT abdomen/pelvis 11/26/2020-no recurrence of the lower pelvic soft tissue mass, small bilateral lower pelvic hernias, no evidence of recurrent or metastatic disease CT abdomen/pelvis 05/27/2021-no evidence of recurrent disease Ovarian cancer in 1988, low-grade adenocarcinoma with areas of "borderline" carcinoma, status post a hysterectomy and bilateral oophorectomy. New onset atrial fibrillation August 2016, maintained on eliquis Recurrent urinary tract infections Restless legs   Disposition: [name] remains in clinical remission from gynecologic cancer.  We will follow-up on the CA125 from today.  She is scheduled for surveillance imaging in October.  She will return for an office visit in 6 months.  She will see Dr. Berline Lopes in the interim.  [name], MD  12/02/2021  11:29 AM

## 2021-12-03 ENCOUNTER — Telehealth: Payer: Self-pay

## 2021-12-03 LAB — CA 125: Cancer Antigen (CA) 125: 20.3 U/mL (ref 0.0–38.1)

## 2021-12-03 NOTE — Telephone Encounter (Signed)
-----   Message from Ladell Pier, MD sent at 12/03/2021 11:09 AM EDT ----- Please call patient, the CA125 is normal, follow-up as scheduled

## 2021-12-03 NOTE — Telephone Encounter (Signed)
Pt verbalized understanding.

## 2022-02-08 DIAGNOSIS — L309 Dermatitis, unspecified: Secondary | ICD-10-CM | POA: Diagnosis not present

## 2022-02-08 DIAGNOSIS — L570 Actinic keratosis: Secondary | ICD-10-CM | POA: Diagnosis not present

## 2022-02-28 ENCOUNTER — Encounter (HOSPITAL_COMMUNITY): Payer: Self-pay | Admitting: Radiology

## 2022-02-28 ENCOUNTER — Ambulatory Visit (HOSPITAL_COMMUNITY)
Admission: RE | Admit: 2022-02-28 | Discharge: 2022-02-28 | Disposition: A | Discharge status: Home / Self Care | Payer: PPO | Source: Ambulatory Visit | Attending: Gynecologic Oncology | Admitting: Gynecologic Oncology

## 2022-02-28 DIAGNOSIS — I700 Atherosclerosis of aorta: Secondary | ICD-10-CM | POA: Insufficient documentation

## 2022-02-28 DIAGNOSIS — K5730 Diverticulosis of large intestine without perforation or abscess without bleeding: Secondary | ICD-10-CM | POA: Insufficient documentation

## 2022-02-28 DIAGNOSIS — N281 Cyst of kidney, acquired: Secondary | ICD-10-CM | POA: Diagnosis not present

## 2022-02-28 DIAGNOSIS — C801 Malignant (primary) neoplasm, unspecified: Secondary | ICD-10-CM | POA: Insufficient documentation

## 2022-02-28 MED ORDER — IOHEXOL 300 MG/ML  SOLN
80.0000 mL | Freq: Once | INTRAMUSCULAR | Status: AC | PRN
  Administered 2022-02-28: 80 mL via INTRAVENOUS

## 2022-03-02 ENCOUNTER — Telehealth: Payer: Self-pay | Admitting: Gynecologic Oncology

## 2022-03-02 NOTE — Telephone Encounter (Signed)
Called the patient to review recent CT scan.  No answer.  Left message requesting callback.  Please let the patient know if she calls back to the office and I am not here that the CT scan shows no evidence of recurrent cancer.  Her hernias have increased in size.  If she more symptomatic?  If she has noticed a change in her symptoms, it may be worth revisiting the idea of repairing her hernias.  Jeral Pinch MD Gynecologic Oncology

## 2022-03-02 NOTE — Telephone Encounter (Signed)
Patient returned my call.  Discussed CT findings with her.  She remains asymptomatic with regard to the hernia.  At this time will continue with close surveillance.  Jeral Pinch MD Gynecologic Oncology

## 2022-03-03 ENCOUNTER — Encounter: Payer: Self-pay | Admitting: Gynecologic Oncology

## 2022-03-04 ENCOUNTER — Inpatient Hospital Stay: Payer: PPO | Admitting: Gynecologic Oncology

## 2022-03-04 DIAGNOSIS — C801 Malignant (primary) neoplasm, unspecified: Secondary | ICD-10-CM

## 2022-03-10 DIAGNOSIS — G894 Chronic pain syndrome: Secondary | ICD-10-CM | POA: Diagnosis not present

## 2022-03-10 DIAGNOSIS — M1991 Primary osteoarthritis, unspecified site: Secondary | ICD-10-CM | POA: Diagnosis not present

## 2022-03-10 DIAGNOSIS — Z6821 Body mass index (BMI) 21.0-21.9, adult: Secondary | ICD-10-CM | POA: Diagnosis not present

## 2022-03-15 ENCOUNTER — Encounter: Payer: Self-pay | Admitting: Gynecologic Oncology

## 2022-03-15 ENCOUNTER — Inpatient Hospital Stay: Payer: PPO | Attending: Gynecologic Oncology | Admitting: Gynecologic Oncology

## 2022-03-15 ENCOUNTER — Other Ambulatory Visit: Payer: Self-pay

## 2022-03-15 ENCOUNTER — Inpatient Hospital Stay: Payer: PPO

## 2022-03-15 VITALS — BP 124/59 | HR 60 | Temp 97.5°F | Resp 16 | Ht 66.93 in | Wt 132.2 lb

## 2022-03-15 DIAGNOSIS — R300 Dysuria: Secondary | ICD-10-CM | POA: Insufficient documentation

## 2022-03-15 DIAGNOSIS — Z9221 Personal history of antineoplastic chemotherapy: Secondary | ICD-10-CM | POA: Insufficient documentation

## 2022-03-15 DIAGNOSIS — N952 Postmenopausal atrophic vaginitis: Secondary | ICD-10-CM | POA: Diagnosis not present

## 2022-03-15 DIAGNOSIS — Z8543 Personal history of malignant neoplasm of ovary: Secondary | ICD-10-CM | POA: Diagnosis not present

## 2022-03-15 DIAGNOSIS — Z90710 Acquired absence of both cervix and uterus: Secondary | ICD-10-CM | POA: Insufficient documentation

## 2022-03-15 DIAGNOSIS — R8290 Unspecified abnormal findings in urine: Secondary | ICD-10-CM | POA: Diagnosis not present

## 2022-03-15 DIAGNOSIS — C801 Malignant (primary) neoplasm, unspecified: Secondary | ICD-10-CM

## 2022-03-15 DIAGNOSIS — Z90722 Acquired absence of ovaries, bilateral: Secondary | ICD-10-CM | POA: Insufficient documentation

## 2022-03-15 DIAGNOSIS — Z923 Personal history of irradiation: Secondary | ICD-10-CM | POA: Insufficient documentation

## 2022-03-15 DIAGNOSIS — K432 Incisional hernia without obstruction or gangrene: Secondary | ICD-10-CM | POA: Diagnosis not present

## 2022-03-15 LAB — URINALYSIS, COMPLETE (UACMP) WITH MICROSCOPIC
Bilirubin Urine: NEGATIVE
Glucose, UA: NEGATIVE mg/dL
Ketones, ur: NEGATIVE mg/dL
Nitrite: NEGATIVE
Protein, ur: NEGATIVE mg/dL
Specific Gravity, Urine: 1.004 — ABNORMAL LOW (ref 1.005–1.030)
WBC, UA: >50 WBC/hpf — ABNORMAL HIGH (ref 0–5)
pH: 7 (ref 5.0–8.0)

## 2022-03-15 NOTE — Progress Notes (Signed)
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 Pathology report says "focal well differentiated serous cystadenocarcinoma". The comment is that the adenocarcinomatous component is limited to rare foci of small invasive glandular structures. Another report states that there is a small focus of papillary adenocarcinoma in a serous cystadenofibroma of the left ovary. Uterus, cervix, left fallopian tube, right adnexa and pelvic washings all without evidence of malignancy. Appendix also removed at the time of surgery.  Her CA-125 at that time was reported to be 33. She did not receive adjuvant therapy for her initial cancer diagnosis.  She presented in 07/2013 with abdominal pain and mass was noted along right inferior rectus muscle. Biopsy of the mass showed adenocarcinoma of unknown primary. She received 3 cycles of neoadjuvant chemotherapy (no response), carboplatin/taxol. After resection, she underwent RT due to positie tumor margin. This was completed in 02/2014.  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 Lower abdominal/suprapubic ventral wall lobulated soft tissue  mass, arising within the lower rectus musculature more so on the  right. 5.5 x 7.9 x 9.7 cm. Favor sarcoma or other connective tissue  tumor. This should be amenable to percutaneous biopsy. In a younger  female patient, endometriosis within a Cesarean section scar would  also be a consideration for this appearance.  2. Narrow fat plane between the mass in #1  and underlying small  bowel and bladder. No lymphadenopathy. No ascites.  3. Surgically absent gallbladder, uterus, and adnexa. Diverticulosis  of the colon.  Study discussed by telephone with PA BENJAMIN MANN on 07/19/2013 at  11:35 .    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 Microscopic Comment Needle core biopsies demonstrate diffuse involvement by well differentiated adenocarcinoma. The adenocarcinoma has the following immunophenotype: Cytokeratin 7 - strong diffuse expression Cytokeratin 20 - negative expression CDX2 - negative expression TTF-1 - negative expression Estrogen receptor - negative expression Vimentin - patchy mild to moderate expression   08/16/2013 - 09/27/2013 Chemotherapy   3 cycle of carboplatin, paclitaxel    10/11/2013 Imaging   CT A/P: A heterogeneous mass centered in the inferior right rectus abdominus  muscle measures 5.3 x 7.5 cm (previously 5.5 x 7.9 cm). No  pathologically enlarged lymph nodes. Scattered atherosclerotic  calcification of the arterial vasculature without abdominal aortic  aneurysm. No free fluid. No worrisome lytic or sclerotic lesions.  Degenerative changes are seen in the spine.   IMPRESSION:  Right rectus abdominus mass is stable to very minimally smaller than  on 07/19/2013.   11/05/2013 Surgery   Resection abdominal wall mass, abdominal wall reconstruction with Strattice matrix (10x11cm)  Operative findings: 8 involving the subcutaneous tissues, fascia, and rectus on the right side. No obvious intraperitoneal disease. Normal-appearing omentum   11/05/2013 Pathology Results   Soft tissue mass, simple excision, abdominal wall mass ADENOCARCINOMA. Microscopic Comment The specimen is extensively involved by adenocarcinoma which focally involves the  edge of the specimen. Immunohistochemistry  is performed and the tumor shows patchy positivity with Napsin-A and is negative with WT-1, estrogen receptor, progesterone receptor, gross disease cystic fluid protein, CDX-2, carcinoembryonic antigen, thyroid transcription factor-1, CD10 and CD117. The immunophenotype is nonspecific and additional immunohistochemistry will be performed and reported as an addendum. (JDP:kh 11/07/13) ADDENDUM: Additional immunohistochemistry is performed and the tumor is positive with cytokeratin AE1/AE3 and cytokeratin 7 and is negative with Calretinin, cytokeratin 5/6 and cytokeratin 20. The immunoreactivity is not specific for location of a primary tumor. (JDP:kh 11-08-13)   11/05/2013 Genetic Testing   [facility] testing MYC amplification - equivocal CREBBP Q1765* No FDA approved therapies in patient's tumor type or another tumor type    01/16/2014 - 02/27/2014 Radiation Therapy   45 gray to the target region within the abdominal wall postoperative area. She received 45 gray using a 4 field 3-D conformal technique at 1.8 gray per fraction. The patient then received a boost using a cone down 4 field technique for an additional 9 Gray. The final dose was 54 gray.   01/29/2018 Pathology Results   Soft Tissue Needle Core Biopsy, ant abd wall mass - ADENOCARCINOMA. - SEE MICROSCOPIC DESCRIPTION. Microscopic Comment The core biopsies consist of abundant dense, hyalinized fibrous tissue with scattered glands with cytologic atypia consistent with adenocarcinoma. Immunohistochemistry shows the tumor is positive with cytokeratin AE1/AE3 and cytokeratin 7 and is negative with cytokeratin 5/6, Calretinin, WT-1, estrogen receptor, progesterone receptor and cytokeratin 20. The morphology and immunophenotype are similar to the morphology in the abdominal wall mass excision from 11/07/2013 [phone]).   03/01/2018 Imaging   PET: 1. Hypermetabolism associated with the cystic and  solid suprapubic mass compatible with the reported clinical history of adenocarcinoma recurrence. Cystic components in the cranial aspect of the lesion are largely devoid of FDG accumulation with the most hypermetabolic tissue being inferiorly, immediately adjacent to the symphysis pubis. 2. Focal uptake identified in the left L3-4 facets without underlying bony lesion. This is presumed to represent uptake related to degenerative change. 3. No other sites of unexpected or suspicious hypermetabolism in the neck, chest, abdomen, or pelvis   03/02/2018 Imaging   CT A/P: 6.7 cm mass in the lower anterior abdominal wall containing cystic and solid components, suspicious for recurrence of previously resected/treated adenocarcinoma in this region. This abuts and could invade the anterior wall of the bladder.   No evidence for more distant metastatic disease within the abdomen or pelvis.    03/28/2018 Surgery   Exploratory laparotomy, resection abdominal wall tumor, oversew of bladder peritoneum, cystoscopy.  Findings: normal external genitalia, unable to palpate mass on exam. After opening the fascia, a cystic and solid mass was encountered below the lower edge of the incision, extending into the space of retzius and adherent to the pubic arch inferiorly, the bladder dome posteriorly, and prior abdominal wall repair anteriorly. At conclusion of the case, no nodularity was palpated in the tumor bed. Visualized bowel and omentum were free of disease. Pelvic peritoenum appeared normal. Cystoscopy revealed intact bladder mucosa with no visible suture. Bilateral ureteral orfices visualized.   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 Additional immunohistochemical studies performed on block A5 in the current specimen demonstrates that the tumor is  positive for Napsin A and PAX-8, and is negative for TTF-1. 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   03/28/2018 Genetic Testing   [facility] No reportable alterations with companion diagnostic claims MS-stable, TMB - 4 Muts/Mb, CREBBP [id number]*   05/21/2018 - 09/08/2018 Chemotherapy   Patient is on Treatment Plan : OVARIAN Carboplatin/Gemcitabine q21d      Pathology Results   A. SOFT TISSUE MASS, PELVIC BODY WALL, NEEDLE CORE BIOPSY:  - Metastatic adenocarcinoma, see comment.   COMMENT:   The tumor has malignant cells with cleared cytoplasm.  Immunohistochemistry is positive for cytokeratin 7, PAX8, racemase, and NapsinA. The cells are negative for ER, WT-1, and CD10. 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 Dr. Benay Spice was notified on 01/08/20.    10/19/2018 Imaging   CT A/P: 1. Previously noted lower anterior abdominal wall mass is no longer identified, presumably surgically resected. Cephalad to the site of the prior mass within the low anterior abdominal wall there is some soft tissue thickening seen on today's examination (axial image 54 of series 2). This is nonspecific and will serve as a baseline for future follow-up examinations. 2. No signs of metastatic disease elsewhere in the abdomen or pelvis. 3. Colonic diverticulosis without evidence of acute diverticulitis at this time. 4. Aortic atherosclerosis. 5. Additional incidental findings, as above.   04/18/2019 Imaging   CT A/P: 1.  No evidence of recurrent or metastatic carcinoma within the abdomen or pelvis. 2. Colonic diverticulosis. No radiographic evidence of diverticulitis.   12/23/2019 Imaging   CT A/P: Within the ventral, midline abdominal wall soft tissue mass is identified anterior to the pubic symphysis measuring 5.2 x 3.3 by 3.4 cm (volume = 31 cm^3), image 59/5 and image 78/2. This is compared with 4.9 x 3.0 by 2.6 (volume = 20)cm IMPRESSION: 1. Stable to slight increase in size of ventral, midline abdominal wall soft tissue mass at the level of the pubic symphysis. 2. No signs of solid organ or nodal metastases. 3. Aortic atherosclerosis.   01/06/2020 Relapse/Recurrence   Anterior abdominal wall mass biopsy: A. SOFT TISSUE MASS, PELVIC BODY WALL, NEEDLE CORE BIOPSY:  - Metastatic adenocarcinoma, see comment.   COMMENT:   The tumor has malignant cells with cleared cytoplasm.  Immunohistochemistry is positive for cytokeratin 7, PAX8, racemase, and  NapsinA. The cells are negative for ER, WT-1, and CD10. The morphology  and immunophenotype are consistent with metastatic clear cell carcinoma  of gynecologic origin (by definition  high grade). Dr. Benay Spice was  notified on 01/08/20.    02/06/2020 Surgery   Exlap with resection of pelvic tumor densely adherent to the anterior aspect of the pubic arch. Surgery at [facility]  nodular mass palpable, fixed to the pubic arch on exam. On dissection into the mons, an irregularly shaped and nodular tumor was implanted on the pubic arch. No evidence of disease on visualized bowel or omentum. The palpated anterior abdominal wall was free of disease. The bladder dome peritoneum was free of disease. Few soft nodules on the anterior wall peritoneum were ablated using the bovie. No palpable tumor on or surrounding the pubic arch following resection.   02/06/2020 Pathology Results   A: "Pelvic cavity, pubic mons tumor, excision - Clear  cell carcinoma, received fragmented, 5.5  cm in aggregate (see comment) - Carcinoma involves multiple cauterized tissue edges  ER negative, PR 1+ (20%)   04/06/2020 Genetic Testing   Negative genetic testing on the TumorNext-[facility]CancerNext testing.  Somatic testing was negative and no germline mutations identified.  The CancerNext gene panel offered by [employer] includes sequencing and rearrangement analysis for the following 34 genes:   APC, ATM, BARD1, BMPR1A, BRCA1, BRCA2, BRIP1, CDH1, CDK4, CDKN2A, CHEK2, DICER1, HOXB13, EPCAM, GREM1, MLH1, MRE11A, MSH2, MSH6, MUTYH, NBN, NF1, PALB2, PMS2, POLD1, POLE, PTEN, RAD50, RAD51C, RAD51D, SMAD4, SMARCA4, STK11, and TP53.  The report date is April 06, 2020.   05/19/2020 Imaging   CT A/P: Interval resolution of suprapubic abdominal wall mass since prior study. No residual metastatic disease identified. Colonic diverticulosis, without radiographic evidence of diverticulitis. Moderate right and small left hernias.   11/26/2020 Imaging   11/26/20: Status post hysterectomy and bilateral salpingo oophorectomy. No evidence of recurrent or metastatic disease. Additional stable ancillary findings as above.   05/27/2021 Imaging   CT A/P: 1. Stable examination status post hysterectomy and bilateral salpingo-oophorectomy, without evidence of recurrent or metastatic disease within the abdomen or pelvis. 2. Moderate volume of formed stool throughout the colon suggestive of constipation. 3. Sigmoid colonic diverticulosis without findings of acute diverticulitis. 4.  Aortic Atherosclerosis (ICD10-I70.0).   02/28/2022 Imaging   CT A/P: 1. No acute intra-abdominal or pelvic pathology. No evidence of recurrent or metastatic disease. 2. Sigmoid diverticulosis. 3. Progression of broad-based herniation of the bowel into the anterior pelvic wall. No bowel obstruction. 4.  Aortic Atherosclerosis (ICD10-I70.0).   Adenocarcinoma of abdominal wall, unclear primary  08/09/2013 Initial  Diagnosis   Adenocarcinoma of abdominal wall, unclear primary   08/16/2013 - 09/25/2013 Chemotherapy   paclitaxel and carboplatin   11/05/2013 Surgery   resection abdominal wall mass    - 02/27/2014 Radiation Therapy   Completed volume directed radiation   01/06/2020 Relapse/Recurrence   Anterior abdominal wall mass biopsy: A. SOFT TISSUE MASS, PELVIC BODY WALL, NEEDLE CORE BIOPSY:  - Metastatic adenocarcinoma, see comment.   COMMENT:   The tumor has malignant cells with cleared cytoplasm.  Immunohistochemistry is positive for cytokeratin 7, PAX8, racemase, and  NapsinA. The cells are negative for ER, WT-1, and CD10. The morphology  and immunophenotype are consistent with metastatic clear cell carcinoma  of gynecologic origin (by definition  high grade). Dr. Benay Spice was  notified on 01/08/20.    02/06/2020 Surgery   Exlap with resection of pelvic tumor densely adherent to the anterior aspect of the pubic arch. [facility] at [facility]  nodular mass palpable, fixed to the pubic arch on exam. On dissection into the mons, an irregularly shaped and nodular tumor was implanted on the pubic arch. No evidence of disease on visualized bowel or omentum. The palpated anterior abdominal wall was free of disease. The bladder dome peritoneum was free of disease. Few soft nodules on the anterior wall peritoneum were ablated using the bovie. No palpable tumor on or surrounding the pubic arch following resection.   02/06/2020 Pathology Results   A: "Pelvic cavity, pubic mons tumor, excision - Clear cell carcinoma, received fragmented, 5.5 cm in aggregate (see comment) - Carcinoma involves multiple cauterized tissue edges  ER negative, PR 1+ (20%)   Metastatic adenocarcinoma of ovary  05/03/2018 Initial Diagnosis   Malignant neoplasm of ovary ([place])   05/21/2018 - 09/08/2018 Chemotherapy   Patient is on Treatment Plan : OVARIAN Carboplatin/Gemcitabine q21d  Interval History: Doing well.  Has had about  4 days of cloudy urine and some very mild dysuria.  Thinks she may have a urinary tract infection.  Reports normal bowel movement.  Denies any pain or irritation related to her hernia.  Denies any abdominal or pelvic pain.  Denies any bleeding or discharge.  Past Medical/Surgical History: Past Medical History:  Diagnosis Date   Anxiety    new dx   Atrial fibrillation ([place])    Cancer ([place]) 1988, 2015   ovarian, adenocarcinoma   GERD (gastroesophageal reflux disease)    hx of years ago   Hx of radiation therapy 01/16/14-02/27/14   abdomen    Hypercholesteremia    under control with diet and fish oil   Hypertension    Ovarian cyst    PONV (postoperative nausea and vomiting)    Restless leg syndrome    Thyroid disease     Past Surgical History:  Procedure Laterality Date   APPENDECTOMY     APPLICATION OF A-CELL OF CHEST/ABDOMEN N/A 11/05/2013   Procedure: ABDOMINAL WALL RESCONTRUCTION WITH STRATUS;  Surgeon: Irene Limbo, MD;  Location: [facility];  Service: Plastics;  Laterality: N/A;   CHOLECYSTECTOMY  2009   LAPAROTOMY N/A 11/05/2013   Procedure: RESECTION OF PELVIC MASS;  Surgeon: Imagene Gurney A. Alycia Rossetti, MD;  Location: [facility];  Service: Gynecology;  Laterality: N/A;   LAPAROTOMY  03/2018   excision of suprapubic mass at [place]  as child   TOTAL ABDOMINAL HYSTERECTOMY  1988   ovarian cyst  BSO    Family History  Problem Relation Age of Onset   Heart disease Mother    Heart disease Sister    Diabetes Sister    Cancer Sister        melanoma-skin cancer   Breast cancer Paternal 35        Age 67's   Cancer Paternal Grandmother        breast   Cancer Paternal Grandfather        prostate/bladder   Colon cancer Neg Hx    Ovarian cancer Neg Hx    Endometrial cancer Neg Hx    Pancreatic cancer Neg Hx    Prostate cancer Neg Hx     Social History   Socioeconomic History   Marital status: Single    Spouse name: Not on file   Number  of children: Not on file   Years of education: Not on file   Highest education level: Not on file  Occupational History   Not on file  Tobacco Use   Smoking status: Never   Smokeless tobacco: Never  Vaping Use   Vaping Use: Never used  Substance and Sexual Activity   Alcohol use: No    Alcohol/week: 0.0 standard drinks of alcohol   Drug use: No   Sexual activity: Never    Birth control/protection: Surgical    Comment: HYST  Other Topics Concern   Not on file  Social History Narrative   Single-never married   No children or pets   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   Enjoys reading, keeps house tidy, plays in Teacher, English as a foreign language choir at [employer] Determinants of Radio broadcast assistant Strain: Not on file  Food Insecurity: Not on file  Transportation Needs: Not on file  Physical Activity: Not on file  Stress: Not on file  Social Connections: Not  on file    Current Medications:  Current Outpatient Medications:    ALPRAZolam (XANAX) 0.5 MG tablet, Take 0.5 mg by mouth at bedtime., Disp: , Rfl:    apixaban (ELIQUIS) 5 MG TABS tablet, TAKE (1) TABLET BY MOUTH TWICE DAILY., Disp: 180 tablet, Rfl: 1   cholecalciferol (VITAMIN D3) 25 MCG (1000 UNIT) tablet, Take 1,000 Units by mouth daily., Disp: , Rfl:    conjugated estrogens (PREMARIN) vaginal cream, Finger tip amount of cream, applied to vulva and outer vagina at night 3 x a week, Disp: 42.5 g, Rfl: 6   levothyroxine (SYNTHROID) 50 MCG tablet, Take 50 mcg by mouth daily., Disp: , Rfl:    metoprolol tartrate (LOPRESSOR) 25 MG tablet, TAKE (1/2) TABLET BY MOUTH TWICE DAILY., Disp: 90 tablet, Rfl: 3   Multiple Vitamin (MULTIVITAMIN WITH MINERALS) TABS tablet, Take 1 tablet by mouth daily., Disp: , Rfl:    pramipexole (MIRAPEX) 0.125 MG tablet, Take 0.125 mg by mouth 2 (two) times daily., Disp: , Rfl:   Review of Systems: Denies appetite changes, fevers, chills, fatigue, unexplained weight  changes. Denies hearing loss, neck lumps or masses, mouth sores, ringing in ears or voice changes. Denies cough or wheezing.  Denies shortness of breath. Denies chest pain or palpitations. Denies leg swelling. Denies abdominal distention, pain, blood in stools, constipation, diarrhea, nausea, vomiting, or early satiety. Denies pain with intercourse, frequency, hematuria or incontinence. Denies hot flashes, pelvic pain, vaginal bleeding or vaginal discharge.   Denies joint pain, back pain or muscle pain/cramps. Denies itching, rash, or wounds. Denies dizziness, headaches, numbness or seizures. Denies swollen lymph nodes or glands, denies easy bruising or bleeding. Denies anxiety, depression, confusion, or decreased concentration.  Physical Exam: BP (!) 124/59 (BP Location: Left Arm, Patient Position: Sitting)   Pulse 60   Temp (!) 97.5 F (36.4 C) (Oral)   Resp 16   Ht 5' 6.93" (1.7 m)   Wt 132 lb 3.2 oz (60 kg)   SpO2 98%   BMI 20.75 kg/m  General: Alert, oriented, no acute distress. HEENT: Normocephalic, atraumatic, sclera anicteric. Chest: Clear to auscultation bilaterally.  No wheezes or rhonchi. Cardiovascular: Regular rate and rhythm, no murmurs. Abdomen: soft, nontender.  Normoactive bowel sounds.  No masses or hepatosplenomegaly appreciated.  Well-healed incisions.  Extremities: Grossly normal range of motion.  Warm, well perfused.  No edema bilaterally. Skin: No rashes or lesions noted. Lymphatics: No cervical, supraclavicular, or inguinal adenopathy. GU: Edema of the mons consistent with known hernias, right greater than left.  There is been some mild increase in size of the hernias, especially the right since her last visit.  There is mild erythema between the labia minora and majora bilaterally.  This is more prominent on the right with some petechia along the inner right labia.  Loss of architecture of bilateral labia noted as well as atrophy.   Speculum exam not  well-tolerated, vaginal mucosa atrophic, no lesions noted.  Bimanual exam with single digit reveals no masses or nodularity.  Rectovaginal exam confirms these findings.  Laboratory & Radiologic Studies: CT A/P on 02/28/22: IMPRESSION: 1. No acute intra-abdominal or pelvic pathology. No evidence of recurrent or metastatic disease. 2. Sigmoid diverticulosis. 3. Progression of broad-based herniation of the bowel into the anterior pelvic wall. No bowel obstruction. 4.  Aortic Atherosclerosis (ICD10-I70.0).  Component Ref Range & Units 3 mo ago (12/02/21) 6 mo ago (08/30/21) 9 mo ago (05/25/21) 1 yr ago (03/01/21) 1 yr ago (12/03/20) 1 yr ago (06/04/20)  1 yr ago (05/06/20)  Cancer Antigen (CA) 125 0.0 - 38.1 U/mL 20.3  21.4 CM  22.7 CM  22.5 CM  19.2 CM  20.5 CM  21.7 CM    Assessment & Plan: RASHAWNDA GABA is a 76 y.o. 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 Completed radiation in 04/2020.   The patient is doing well today and is NED on exam.    We will plan to get a CA-125 today.  We will have the office call her with these results.   Given her high risk cancer as well as prior diagnoses of recurrence on imaging only, we will plan for CT scan in 6 months in late April.  This was ordered today.    Patient will continue using vaginal estrogen for vaginal and vulvar atrophy.  Plan to get urinalysis and urine culture given urinary symptoms today.  We will call her with these results.  She continues to remain asymptomatic with regards to her hernia.  We may revisit this once she is further out from her last recurrence, but I suspect given the complicated nature of this repair, that deferring repair will continue to be recommended as long as the patient is asymptomatic. She previously saw Dr. Rosendo Gros who  recommended expectant management unless she were to become symptomatic.     In terms of follow-up, we discussed signs and symptoms that should prompt a phone call to see me sooner than her next scheduled visit.  Patient is almost 2 years from completion of her last recurrence.  We will plan to transition to visits every 4 months.  I will see her next at the end of February.  22 minutes of total time was spent for this patient encounter, including preparation, face-to-face counseling with the patient and coordination of care, and documentation of the encounter.  Jeral Pinch, MD  Division of Gynecologic Oncology  Department of Obstetrics and Gynecology  [facility]

## 2022-03-15 NOTE — Patient Instructions (Signed)
It was good to see you today.  We will call you with your urine test results as well as your Ca1 25.  I will see you for follow-up in 4 months.  As always, please call me if any new symptoms develop before then.  We will plan to repeat a CT scan in 6 months.

## 2022-03-16 ENCOUNTER — Telehealth: Payer: Self-pay

## 2022-03-16 ENCOUNTER — Other Ambulatory Visit: Payer: Self-pay | Admitting: Family Medicine

## 2022-03-16 DIAGNOSIS — Z1231 Encounter for screening mammogram for malignant neoplasm of breast: Secondary | ICD-10-CM

## 2022-03-16 LAB — CA 125: Cancer Antigen (CA) 125: 26.2 U/mL (ref 0.0–38.1)

## 2022-03-16 NOTE — Telephone Encounter (Signed)
Pt aware of urinalysis showing an infection. She states she will wait for the culture before getting abx. She states she has no S&S except for the urine cloudiness.  Also, aware of CA125 being normal Joylene John NP and Dr.Tucker aware

## 2022-03-16 NOTE — Progress Notes (Signed)
Could we let this patient know CA-125 is normal and urine testing shows an infection. Depending on symptoms today, Melissa would you send something in (versus waiting until we have sensitivities)? Thank you

## 2022-03-17 ENCOUNTER — Other Ambulatory Visit: Payer: Self-pay | Admitting: Gynecologic Oncology

## 2022-03-17 ENCOUNTER — Telehealth: Payer: Self-pay | Admitting: Surgery

## 2022-03-17 DIAGNOSIS — N3001 Acute cystitis with hematuria: Secondary | ICD-10-CM

## 2022-03-17 LAB — URINE CULTURE: Culture: >=100000 — AB

## 2022-03-17 MED ORDER — SULFAMETHOXAZOLE-TRIMETHOPRIM 800-160 MG PO TABS: 1.0000 | ORAL_TABLET | Freq: Two times a day (BID) | ORAL | 0 refills | Status: AC

## 2022-03-17 NOTE — Progress Notes (Signed)
Bactrim sent in per Dr. Berline Lopes for UTI. Our office is reaching out to her PCP for a recent creatinine level. No hx of kidney disease.

## 2022-03-17 NOTE — Telephone Encounter (Signed)
Per Joylene John, NP, called patient to let her know Bactrim prescription has been called in for her UTI. Patient instructed to stay well hydrated while taking this medication. Patient verbalized understanding and had no other concerns at this time.

## 2022-03-28 ENCOUNTER — Telehealth: Payer: Self-pay | Admitting: Surgery

## 2022-03-28 NOTE — Telephone Encounter (Signed)
Called patient to follow up regarding UTI symptoms. Patients states she is doing well and feels antibiotics have cleared everything up. Denies any pain, frequency, burning, retention, odor, bleeding, fever or chills. Patient advised to call our office for any other concerns.

## 2022-04-11 DIAGNOSIS — Z6821 Body mass index (BMI) 21.0-21.9, adult: Secondary | ICD-10-CM | POA: Diagnosis not present

## 2022-04-11 DIAGNOSIS — C482 Malignant neoplasm of peritoneum, unspecified: Secondary | ICD-10-CM | POA: Diagnosis not present

## 2022-04-11 DIAGNOSIS — G4709 Other insomnia: Secondary | ICD-10-CM | POA: Diagnosis not present

## 2022-04-11 DIAGNOSIS — E782 Mixed hyperlipidemia: Secondary | ICD-10-CM | POA: Diagnosis not present

## 2022-04-11 DIAGNOSIS — Z0000 Encounter for general adult medical examination without abnormal findings: Secondary | ICD-10-CM | POA: Diagnosis not present

## 2022-04-11 DIAGNOSIS — E039 Hypothyroidism, unspecified: Secondary | ICD-10-CM | POA: Diagnosis not present

## 2022-04-11 DIAGNOSIS — I4891 Unspecified atrial fibrillation: Secondary | ICD-10-CM | POA: Diagnosis not present

## 2022-04-11 DIAGNOSIS — E7849 Other hyperlipidemia: Secondary | ICD-10-CM | POA: Diagnosis not present

## 2022-04-11 DIAGNOSIS — M1991 Primary osteoarthritis, unspecified site: Secondary | ICD-10-CM | POA: Diagnosis not present

## 2022-04-11 DIAGNOSIS — Z1331 Encounter for screening for depression: Secondary | ICD-10-CM | POA: Diagnosis not present

## 2022-04-27 NOTE — Progress Notes (Signed)
Cardiology Office Note:    Date:  04/28/2022  ID:  [name], DOB Feb 20, 1946, MRN [id number]  PCP:  Sharilyn Sites, [facility] [place] [facility] Cardiologist:  Carlyle Dolly, MD     Referring MD: Sharilyn Sites, MD   CC: Here for 1 year follow-up  History of Present Illness:    [name] is a 76 y.o. female with a hx of the following:  Paroxysmal A-fib Hyperlipidemia Hypothyroidism History of chest pain History of ovarian cancer GERD  Patient is a very delightful 76 year old female with past medical history as mentioned above.  Newly diagnosed with atrial fibrillation in 2016 when she was admitted with palpitations and left arm pain.  She converted back to normal sinus rhythm during admission, was discharged on low-dose Lopressor.  Echo at that time revealed EF 55 to 60%, moderate LAE.  Was tolerating Eliquis well.  Underwent surgery for metastatic ovarian adenocarcinoma in 2019. Since then, she has been followed by [facility] for recurring abdominal cancer.  She underwent resection of abdominal wall mass for recurrent clear-cell cancer gynecological origin in 2021.  Primary oncologist is Dr. Berline Lopes.  Last seen by Dr. Carlyle Dolly on March 17, 2021.  Currently was in remission for her history of ovarian cancer.  She was compliant with her medications and denied any palpitations.  Denied any bleeding while on Eliquis.  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  Was overall doing well from a cardiac perspective and was told to follow-up in 1 year.  Today she presents for 1 year follow-up.  She states she is doing well.  Denies any significant changes to her health since 1 year ago.  She did state she had a CT scan done in October and will have a repeat CT scan done in April of next year.  Denies any chest pain, shortness of breath, palpitations, syncope, presyncope, dizziness, orthopnea, PND, swelling, significant weight changes,  acute bleeding, or claudication.  Past Medical History:  Diagnosis Date   Anxiety    new dx   Atrial fibrillation ([place])    Cancer ([place]) 1988, 2015   ovarian, adenocarcinoma   GERD (gastroesophageal reflux disease)    hx of years ago   Hx of radiation therapy 01/16/14-02/27/14   abdomen    Hypercholesteremia    under control with diet and fish oil   Hypertension    Ovarian cyst    PONV (postoperative nausea and vomiting)    Restless leg syndrome    Thyroid disease     Past Surgical History:  Procedure Laterality Date   APPENDECTOMY     APPLICATION OF A-CELL OF CHEST/ABDOMEN N/A 11/05/2013   Procedure: ABDOMINAL WALL RESCONTRUCTION WITH STRATUS;  Surgeon: Irene Limbo, MD;  Location: [facility];  Service: Plastics;  Laterality: N/A;   CHOLECYSTECTOMY  2009   LAPAROTOMY N/A 11/05/2013   Procedure: RESECTION OF PELVIC MASS;  Surgeon: Imagene Gurney A. Alycia Rossetti, MD;  Location: [facility];  Service: Gynecology;  Laterality: N/A;   LAPAROTOMY  03/2018   excision of suprapubic mass at [place]  as child   TOTAL ABDOMINAL HYSTERECTOMY  1988   ovarian cyst  BSO    Current Medications: Current Meds  Medication Sig   apixaban (ELIQUIS) 5 MG TABS tablet TAKE (1) TABLET BY MOUTH TWICE DAILY.   cholecalciferol (VITAMIN D3) 25 MCG (1000 UNIT) tablet Take 1,000 Units by mouth daily.  conjugated estrogens (PREMARIN) vaginal cream Finger tip amount of cream, applied to vulva and outer vagina at night 3 x a week   levothyroxine (SYNTHROID) 50 MCG tablet Take 50 mcg by mouth daily.   metoprolol tartrate (LOPRESSOR) 25 MG tablet TAKE (1/2) TABLET BY MOUTH TWICE DAILY.   Multiple Vitamin (MULTIVITAMIN WITH MINERALS) TABS tablet Take 1 tablet by mouth daily.   pramipexole (MIRAPEX) 0.125 MG tablet Take 0.125 mg by mouth 2 (two) times daily.     Allergies:   Patient has no known allergies.   Social History   Socioeconomic History   Marital status: Single    Spouse  name: Not on file   Number of children: Not on file   Years of education: Not on file   Highest education level: Not on file  Occupational History   Not on file  Tobacco Use   Smoking status: Never   Smokeless tobacco: Never  Vaping Use   Vaping Use: Never used  Substance and Sexual Activity   Alcohol use: No    Alcohol/week: 0.0 standard drinks of alcohol   Drug use: No   Sexual activity: Never    Birth control/protection: Surgical    Comment: HYST  Other Topics Concern   Not on file  Social History Narrative   Single-never married   No children or pets   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 There is no history of Colon cancer, Ovarian cancer, Endometrial cancer, Pancreatic cancer, or Prostate cancer.  ROS:   Review of Systems  Constitutional: Negative.   HENT: Negative.    Eyes: Negative.   Respiratory: Negative.    Cardiovascular: Negative.   Gastrointestinal: Negative.   Genitourinary: Negative.   Musculoskeletal: Negative.   Skin: Negative.   Neurological: Negative.   Endo/Heme/Allergies: Negative.   Psychiatric/Behavioral: Negative.      Please see the history of present illness.    All other systems reviewed and are negative.  EKGs/Labs/Other Studies Reviewed:    The following studies were reviewed today:   EKG:  EKG is  ordered today.  The ekg ordered today demonstrates normal sinus rhythm, 60  bpm, with 1 PAC, otherwise nothing acute.  2D echocardiogram on January 13, 2015: Study Conclusions   - Left ventricle: The cavity size was normal. Wall thickness was    normal. Systolic function was normal. The estimated ejection    fraction was in the range of 55% to 60%. Wall motion was normal;    there were no regional wall motion abnormalities. Left    ventricular diastolic function parameters were normal.  - Mitral valve: Mildly calcified annulus. There was mild    regurgitation.  - Left atrium: The atrium was moderately dilated. Volume/bsa, S:    37.9 ml/m^2.  - Right atrium: The atrium was mildly to moderately dilated.  - Tricuspid valve: There was mild-moderate regurgitation.  Recent Labs: 05/25/2021: ALT 31; BUN 21; Creatinine  0.89; Potassium 4.7; Sodium 142  Recent Lipid Panel No results found for: "CHOL", "TRIG", "HDL", "CHOLHDL", "VLDL", "LDLCALC", "LDLDIRECT"   Risk Assessment/Calculations:    CHA2DS2-VASc Score = 3  This indicates a 3.2% annual risk of stroke. The patient's score is based upon: CHF History: 0 HTN History: 0 Diabetes History: 0 Stroke History: 0 Vascular Disease History: 0 Age Score: 2 Gender Score: 1    Physical Exam:    VS:  BP 122/68   Pulse (!) 59   Ht 5\' 6"  (1.676 m)   Wt 134 lb 6.4 oz (61 kg)   SpO2 95%   BMI 21.69 kg/m     Wt Readings from Last 3 Encounters:  04/28/22 134 lb 6.4 oz (61 kg)  03/15/22 132 lb 3.2 oz (60 kg)  12/02/21 137 lb 12.8 oz (62.5 kg)     GEN: Well nourished, well developed 76 year old female in no acute distress HEENT: Normal NECK: No JVD; No carotid bruits CARDIAC: S1/S2, RRR, no murmurs, rubs, gallops; 2+ peripheral pulses throughout, strong and equal bilaterally RESPIRATORY:  Clear and diminished to auscultation without rales, wheezing or rhonchi  MUSCULOSKELETAL:  No edema; No deformity  SKIN: Thin skin, warm and dry NEUROLOGIC:  Alert and oriented x 3 PSYCHIATRIC:  Normal affect   ASSESSMENT:     1. Paroxysmal atrial fibrillation (HCC)   2. Hyperlipidemia, unspecified hyperlipidemia type   3. Hypothyroidism, unspecified type    PLAN:    In order of problems listed above:  Paroxysmal Atrial fibrillation Denies any tachycardia or palpitations. NSR on EKG today. Tolerating medications well.  CHA2DS2-VASc score 3. Denies any bleeding issues while on Eliquis. Currently on appropriate dosage. Continue Eliquis and Lopressor. Heart healthy diet and regular cardiovascular exercise encouraged.   HLD Recent labs reviewed.  Total cholesterol 214, triglycerides 125, HDL 57, LDL 135.  Currently not at goal.  Discussed initiating statin therapy, and stated she would like to work on lifestyle modifications first. Benefits and risks of medication therapy discussed and she verbalized understanding. She politely declined medication therapy at this time. Heart healthy diet and regular cardiovascular exercise encouraged. Continue to follow with PCP.  Hypothyroidism Recent labs WNL. Continue Synthroid. Continue to follow with PCP.  4. Disposition: Follow up with Dr. Carlyle Dolly in 1 year or sooner if anything changes.   Medication Adjustments/Labs and Tests Ordered: Current medicines are reviewed at length with the patient today.  Concerns regarding medicines are outlined above.  Orders Placed This Encounter  Procedures   EKG 12-Lead   No orders of the defined types were placed in this encounter.   Patient Instructions  Medication Instructions:  Continue all current medications.  Labwork: none  Testing/Procedures: none  Follow-Up: Your physician wants you to follow up in:  1 year.  You should receive a call from the office when due.  If you don't receive this call, please call our office to schedule the follow up appointment    Any Other Special Instructions Will Be Listed Below (If Applicable).   If you need a refill on your cardiac medications before your next appointment,  please call your pharmacy.    SignedFinis Bud, NP  04/30/2022 10:31 AM    [place]

## 2022-04-28 ENCOUNTER — Ambulatory Visit: Payer: PPO | Attending: Nurse Practitioner | Admitting: Nurse Practitioner

## 2022-04-28 ENCOUNTER — Encounter: Payer: Self-pay | Admitting: Nurse Practitioner

## 2022-04-28 VITALS — BP 122/68 | HR 59 | Ht 66.0 in | Wt 134.4 lb

## 2022-04-28 DIAGNOSIS — E039 Hypothyroidism, unspecified: Secondary | ICD-10-CM | POA: Diagnosis not present

## 2022-04-28 DIAGNOSIS — I480 Paroxysmal atrial fibrillation: Secondary | ICD-10-CM

## 2022-04-28 DIAGNOSIS — E785 Hyperlipidemia, unspecified: Secondary | ICD-10-CM

## 2022-04-28 NOTE — Patient Instructions (Signed)

## 2022-05-11 DIAGNOSIS — E039 Hypothyroidism, unspecified: Secondary | ICD-10-CM | POA: Diagnosis not present

## 2022-05-11 DIAGNOSIS — G8929 Other chronic pain: Secondary | ICD-10-CM | POA: Diagnosis not present

## 2022-05-11 DIAGNOSIS — I2510 Atherosclerotic heart disease of native coronary artery without angina pectoris: Secondary | ICD-10-CM | POA: Diagnosis not present

## 2022-05-11 DIAGNOSIS — I4891 Unspecified atrial fibrillation: Secondary | ICD-10-CM | POA: Diagnosis not present

## 2022-05-11 DIAGNOSIS — E559 Vitamin D deficiency, unspecified: Secondary | ICD-10-CM | POA: Diagnosis not present

## 2022-05-11 DIAGNOSIS — G2581 Restless legs syndrome: Secondary | ICD-10-CM | POA: Diagnosis not present

## 2022-05-11 DIAGNOSIS — D6869 Other thrombophilia: Secondary | ICD-10-CM | POA: Diagnosis not present

## 2022-05-11 DIAGNOSIS — C482 Malignant neoplasm of peritoneum, unspecified: Secondary | ICD-10-CM | POA: Diagnosis not present

## 2022-05-11 DIAGNOSIS — Z7901 Long term (current) use of anticoagulants: Secondary | ICD-10-CM | POA: Diagnosis not present

## 2022-05-11 DIAGNOSIS — I700 Atherosclerosis of aorta: Secondary | ICD-10-CM | POA: Diagnosis not present

## 2022-05-13 ENCOUNTER — Ambulatory Visit
Admission: RE | Admit: 2022-05-13 | Discharge: 2022-05-13 | Disposition: A | Discharge status: Home / Self Care | Payer: PPO | Source: Ambulatory Visit | Attending: Family Medicine | Admitting: Family Medicine

## 2022-05-13 DIAGNOSIS — Z1231 Encounter for screening mammogram for malignant neoplasm of breast: Secondary | ICD-10-CM

## 2022-05-20 ENCOUNTER — Telehealth: Payer: Self-pay

## 2022-05-20 NOTE — Telephone Encounter (Signed)
[name] called office today stating she has noticed a vaginal odor x 1 week. No other S&S. (No itching/discharge,bleeding or irritation) she is not having to wear a pad/pantyliner.   Per Joylene John NP, watch over the weekend, if notices any of the above S&S, she needs to see her PCP. Pt aware and verbalized an understanding

## 2022-06-02 ENCOUNTER — Inpatient Hospital Stay: Payer: PPO | Attending: Gynecologic Oncology

## 2022-06-02 ENCOUNTER — Inpatient Hospital Stay: Payer: PPO | Admitting: Oncology

## 2022-06-02 VITALS — BP 119/64 | HR 60 | Temp 98.2°F | Resp 18 | Ht 66.0 in | Wt 135.8 lb

## 2022-06-02 DIAGNOSIS — Z7901 Long term (current) use of anticoagulants: Secondary | ICD-10-CM | POA: Diagnosis not present

## 2022-06-02 DIAGNOSIS — G2581 Restless legs syndrome: Secondary | ICD-10-CM | POA: Insufficient documentation

## 2022-06-02 DIAGNOSIS — I4891 Unspecified atrial fibrillation: Secondary | ICD-10-CM | POA: Diagnosis not present

## 2022-06-02 DIAGNOSIS — Z8543 Personal history of malignant neoplasm of ovary: Secondary | ICD-10-CM | POA: Diagnosis not present

## 2022-06-02 DIAGNOSIS — C569 Malignant neoplasm of unspecified ovary: Secondary | ICD-10-CM | POA: Diagnosis not present

## 2022-06-02 DIAGNOSIS — Z87440 Personal history of urinary (tract) infections: Secondary | ICD-10-CM | POA: Diagnosis not present

## 2022-06-02 NOTE — Progress Notes (Signed)
[facility] OFFICE PROGRESS NOTE   Diagnosis: Ovarian cancer  INTERVAL HISTORY:   Ms. Heavrin returns as scheduled.  She feels well.  Good appetite.  No abdominal pain.  No dysuria.  No difficulty with bowel function.  Objective:  Vital signs in last 24 hours:  Blood pressure 119/64, pulse 60, temperature 98.2 F (36.8 C), temperature source Oral, resp. rate 18, height 5\' 6"  (1.676 m), weight 135 lb 12.8 oz (61.6 kg), SpO2 100 %.     Lymphatics: No cervical, supraclavicular, axillary, or inguinal nodes Resp: Lungs clear bilaterally Cardio: Regular rate and rhythm GI: No hepatosplenomegaly, no apparent ascites, no mass, reducible hernias in the pubic area, transverse scars without evidence of recurrent tumor Vascular: No leg edema     Lab Results:  Lab Results  Component Value Date   WBC 6.2 05/06/2020   HGB 12.3 05/06/2020   HCT 36.1 05/06/2020   MCV 95.8 05/06/2020   PLT 209 05/06/2020   NEUTROABS 4.0 05/06/2020    CMP  Lab Results  Component Value Date   NA 142 05/25/2021   K 4.7 05/25/2021   CL 106 05/25/2021   CO2 31 05/25/2021   GLUCOSE 110 (H) 05/25/2021   BUN 21 05/25/2021   CREATININE 0.89 05/25/2021   CALCIUM 10.1 05/25/2021   PROT 6.6 05/25/2021   ALBUMIN 4.0 05/25/2021   AST 30 05/25/2021   ALT 31 05/25/2021   ALKPHOS 49 05/25/2021   BILITOT 0.3 05/25/2021   GFRNONAA >60 05/25/2021   GFRAA >60 12/25/2019    Lab Results  Component Value Date   CA125 10 03/17/2016     Medications: I have reviewed the patient's current medications.   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   Elevated CA 125.   Cycle 1 Taxol/carboplatin 08/16/2013.   Cycle 2 Taxol/carboplatin 09/06/2013.   Cycle 3 Taxol/carboplatin 09/27/2013   CT 10/11/2013 with a stable abdominal wall  mass. Status post resection of abdominal wall mass with abdominal wall reconstruction 11/05/2013. Final pathology showed adenocarcinoma. Specimen extensively involved with adenocarcinoma which focally involved the edge of the specimen. Immunostains and Foundation 1 testing were nonspecific for a primary tumor location. Adjuvant radiation to the abdominal wall 01/16/2014 through 02/27/2014 CT 01/15/2018 while in the emergency room for evaluation of back pain- midline abdominal wall mass above the pubic symphysis and adjacent to the bladder Ultrasound-guided biopsy of the abdominal wall mass 01/25/2018-adenocarcinoma similar to the 2015 biopsy PET scan 03/01/2018-hypermetabolism associated with a cystic and solid suprapubic mass.  Focal uptake identified in the left L3-4 facets without underlying bony lesion.  No other sites of unexpected or suspicious hypermetabolism in the neck, chest, abdomen or pelvis. Exploratory laparotomy, resection of abdominal wall tumor, oversew of bladder peritoneum, cystoscopy 03/28/2018 (findings included a cystic and solid mass encountered below the lower edge of the incision extending into the space of retzius and adherent to the pubic arch inferiorly, bladder dome posteriorly and prior abdominal wall repair anteriorly.  No nodularity palpated in the tumor bed.  Visualized bowel and omentum free of disease.  Pelvic peritoneum appeared normal).  A "small "amount of tumor on the periosteum was cauterized using a Bovie. Pathology on pelvic tumor-metastatic high-grade adenocarcinoma, favor clear-cell carcinoma of gynecologic origin, fragments up to 5.8 cm in size; ER negative, PR positive, 1+ (weak staining) to focally 2+, 30-40%, MSI-stable, mutation  burden-4,CREBBP alteration Cycle 1 gemcitabine/carboplatin 05/21/2018; day 8 held due to neutropenia Cycle 2 gemcitabine/carboplatin 06/11/2018 (day 8 gemcitabine eliminated) Cycle 3 gemcitabine/carboplatin 07/06/2018 Cycle 4  gemcitabine/carboplatin 07/27/2018 (Udenyca added) Cycle 5 gemcitabine/carboplatin 08/17/2018 Cycle 6 gemcitabine/carboplatin 09/07/2018  CT 10/19/2018- low anterior abdominal wall mass no longer identified, soft tissue thickening cephalad to the site of prior mass-nonspecific, no additional evidence of metastatic disease CT 04/18/2019-no evidence of recurrent or metastatic disease CT 12/23/2019-slight increase in size of ventral midline soft tissue mass near the pubis, no other evidence of disease progression Biopsy pelvic body wall soft tissue mass 01/06/2020-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 Surgical excision of the soft tissue mass adherent to the pubis 02/06/2020-clear cell carcinoma, positive surgical margins Adjuvant radiation 03/25/2020- 04/29/2020 CT abdomen/pelvis 05/19/2020- resolution of suprapubic abdominal wall mass, no evidence of metastatic disease, moderate right and small left hernias CT abdomen/pelvis 11/26/2020-no recurrence of the lower pelvic soft tissue mass, small bilateral lower pelvic hernias, no evidence of recurrent or metastatic disease CT abdomen/pelvis 05/27/2021-no evidence of recurrent disease CT abdomen/pelvis 02/28/2022-no evidence of recurrent disease progressive bowel herniation in the anterior pelvic wall Ovarian cancer in 1988, low-grade adenocarcinoma with areas of "borderline" carcinoma, status post a hysterectomy and bilateral oophorectomy. New onset atrial fibrillation August 2016, maintained on eliquis Recurrent urinary tract infections Restless legs    Disposition: [name] remains in clinical remission from the low-grade ovarian cancer.  He is scheduled for a surveillance CT and follow-up with Dr. Pricilla Holm in April.  She will return for an office visit and CA125 in 6 months.  We will follow-up on the CA125 from today.  Thornton Papas, MD  06/02/2022  10:53 AM

## 2022-06-03 ENCOUNTER — Other Ambulatory Visit: Payer: Self-pay | Admitting: Cardiology

## 2022-06-03 LAB — CA 125: Cancer Antigen (CA) 125: 20.3 U/mL (ref 0.0–38.1)

## 2022-06-06 ENCOUNTER — Telehealth: Payer: Self-pay | Admitting: *Deleted

## 2022-06-06 NOTE — Telephone Encounter (Signed)
Called to request CA 125 results. Made her aware it is improved at 20.3. She reports intermittent numbness top of her head. No headaches or visual changes. Denies facial drooping or weakness and speech is clear. Tells RN she will call her PCP about this issue today.

## 2022-06-08 DIAGNOSIS — Z6821 Body mass index (BMI) 21.0-21.9, adult: Secondary | ICD-10-CM | POA: Diagnosis not present

## 2022-06-08 DIAGNOSIS — G2581 Restless legs syndrome: Secondary | ICD-10-CM | POA: Diagnosis not present

## 2022-06-08 DIAGNOSIS — F419 Anxiety disorder, unspecified: Secondary | ICD-10-CM | POA: Diagnosis not present

## 2022-06-15 DIAGNOSIS — D225 Melanocytic nevi of trunk: Secondary | ICD-10-CM | POA: Diagnosis not present

## 2022-06-15 DIAGNOSIS — L814 Other melanin hyperpigmentation: Secondary | ICD-10-CM | POA: Diagnosis not present

## 2022-06-15 DIAGNOSIS — L821 Other seborrheic keratosis: Secondary | ICD-10-CM | POA: Diagnosis not present

## 2022-06-24 ENCOUNTER — Inpatient Hospital Stay: Payer: PPO | Attending: Gynecologic Oncology | Admitting: Gynecologic Oncology

## 2022-06-24 ENCOUNTER — Encounter: Payer: Self-pay | Admitting: Gynecologic Oncology

## 2022-06-24 ENCOUNTER — Other Ambulatory Visit: Payer: Self-pay

## 2022-06-24 VITALS — BP 111/53 | HR 58 | Temp 97.7°F | Resp 16 | Ht 66.0 in | Wt 133.4 lb

## 2022-06-24 DIAGNOSIS — Z90710 Acquired absence of both cervix and uterus: Secondary | ICD-10-CM | POA: Insufficient documentation

## 2022-06-24 DIAGNOSIS — Z9221 Personal history of antineoplastic chemotherapy: Secondary | ICD-10-CM | POA: Insufficient documentation

## 2022-06-24 DIAGNOSIS — Z923 Personal history of irradiation: Secondary | ICD-10-CM | POA: Insufficient documentation

## 2022-06-24 DIAGNOSIS — Z8543 Personal history of malignant neoplasm of ovary: Secondary | ICD-10-CM | POA: Insufficient documentation

## 2022-06-24 DIAGNOSIS — C801 Malignant (primary) neoplasm, unspecified: Secondary | ICD-10-CM

## 2022-06-24 DIAGNOSIS — Z8544 Personal history of malignant neoplasm of other female genital organs: Secondary | ICD-10-CM | POA: Diagnosis not present

## 2022-06-24 DIAGNOSIS — Z90722 Acquired absence of ovaries, bilateral: Secondary | ICD-10-CM | POA: Insufficient documentation

## 2022-06-24 DIAGNOSIS — K432 Incisional hernia without obstruction or gangrene: Secondary | ICD-10-CM

## 2022-06-24 DIAGNOSIS — N952 Postmenopausal atrophic vaginitis: Secondary | ICD-10-CM

## 2022-06-24 NOTE — Patient Instructions (Signed)
It was good to see you today.  I do not see or feel any evidence of cancer recurrence on your exam.  I will see you for follow-up in 4 months. I will call you with April CT results.  As always, if you develop any new and concerning symptoms before your next visit, please call to see me sooner.

## 2022-06-24 NOTE — Progress Notes (Signed)
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 Pathology report says "focal well differentiated serous cystadenocarcinoma". The comment is that the adenocarcinomatous component is limited to rare foci of small invasive glandular structures. Another report states that there is a small focus of papillary adenocarcinoma in a serous cystadenofibroma of the left ovary. Uterus, cervix, left fallopian tube, right adnexa and pelvic washings all without evidence of malignancy. Appendix also removed at the time of surgery.  Her CA-125 at that time was reported to be 33. She did not receive adjuvant therapy for her initial cancer diagnosis.  She presented in 07/2013 with abdominal pain and mass was noted along right inferior rectus muscle. Biopsy of the mass showed adenocarcinoma of unknown primary. She received 3 cycles of neoadjuvant chemotherapy (no response), carboplatin/taxol. After resection, she underwent RT due to positie tumor margin. This was completed in 02/2014.  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 Lower abdominal/suprapubic ventral wall lobulated soft tissue  mass, arising within the lower rectus musculature more so on the  right. 5.5 x 7.9 x 9.7 cm. Favor sarcoma or other connective tissue  tumor. This should be amenable to percutaneous biopsy. In a younger  female patient, endometriosis within a Cesarean section scar would  also be a consideration for this appearance.  2. Narrow fat plane between the mass in #1  and underlying small  bowel and bladder. No lymphadenopathy. No ascites.  3. Surgically absent gallbladder, uterus, and adnexa. Diverticulosis  of the colon.  Study discussed by telephone with PA BENJAMIN MANN on 07/19/2013 at  11:35 .    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 Microscopic Comment Needle core biopsies demonstrate diffuse involvement by well differentiated adenocarcinoma. The adenocarcinoma has the following immunophenotype: Cytokeratin 7 - strong diffuse expression Cytokeratin 20 - negative expression CDX2 - negative expression TTF-1 - negative expression Estrogen receptor - negative expression Vimentin - patchy mild to moderate expression   08/16/2013 - 09/27/2013 Chemotherapy   3 cycle of carboplatin, paclitaxel    10/11/2013 Imaging   CT A/P: A heterogeneous mass centered in the inferior right rectus abdominus  muscle measures 5.3 x 7.5 cm (previously 5.5 x 7.9 cm). No  pathologically enlarged lymph nodes. Scattered atherosclerotic  calcification of the arterial vasculature without abdominal aortic  aneurysm. No free fluid. No worrisome lytic or sclerotic lesions.  Degenerative changes are seen in the spine.   IMPRESSION:  Right rectus abdominus mass is stable to very minimally smaller than  on 07/19/2013.   11/05/2013 Surgery   Resection abdominal wall mass, abdominal wall reconstruction with Strattice matrix (10x11cm)  Operative findings: 8 involving the subcutaneous tissues, fascia, and rectus on the right side. No obvious intraperitoneal disease. Normal-appearing omentum   11/05/2013 Pathology Results   Soft tissue mass, simple excision, abdominal wall mass ADENOCARCINOMA. Microscopic Comment The specimen is extensively involved by adenocarcinoma which focally involves the  edge of the specimen.  Immunohistochemistry is performed and the tumor shows patchy positivity with Napsin-A and is negative with WT-1, estrogen receptor, progesterone receptor, gross disease cystic fluid protein, CDX-2, carcinoembryonic antigen, thyroid transcription factor-1, CD10 and CD117. The immunophenotype is nonspecific and additional immunohistochemistry will be performed and reported as an addendum. (JDP:kh 11/07/13) ADDENDUM: Additional immunohistochemistry is performed and the tumor is positive with cytokeratin AE1/AE3 and cytokeratin 7 and is negative with Calretinin, cytokeratin 5/6 and cytokeratin 20. The immunoreactivity is not specific for location of a primary tumor. (JDP:kh 11-08-13)   11/05/2013 Genetic Testing   [facility] testing MYC amplification - equivocal CREBBP Q1765* No FDA approved therapies in patient's tumor type or another tumor type    01/16/2014 - 02/27/2014 Radiation Therapy   45 gray to the target region within the abdominal wall postoperative area. She received 45 gray using a 4 field 3-D conformal technique at 1.8 gray per fraction. The patient then received a boost using a cone down 4 field technique for an additional 9 Gray. The final dose was 54 gray.   01/29/2018 Pathology Results   Soft Tissue Needle Core Biopsy, ant abd wall mass - ADENOCARCINOMA. - SEE MICROSCOPIC DESCRIPTION. Microscopic Comment The core biopsies consist of abundant dense, hyalinized fibrous tissue with scattered glands with cytologic atypia consistent with adenocarcinoma. Immunohistochemistry shows the tumor is positive with cytokeratin AE1/AE3 and cytokeratin 7 and is negative with cytokeratin 5/6, Calretinin, WT-1, estrogen receptor, progesterone receptor and cytokeratin 20. The morphology and immunophenotype are similar to the morphology in the abdominal wall mass excision from 11/07/2013 [phone]).   03/01/2018 Imaging   PET: 1. Hypermetabolism associated with the cystic and  solid suprapubic mass compatible with the reported clinical history of adenocarcinoma recurrence. Cystic components in the cranial aspect of the lesion are largely devoid of FDG accumulation with the most hypermetabolic tissue being inferiorly, immediately adjacent to the symphysis pubis. 2. Focal uptake identified in the left L3-4 facets without underlying bony lesion. This is presumed to represent uptake related to degenerative change. 3. No other sites of unexpected or suspicious hypermetabolism in the neck, chest, abdomen, or pelvis   03/02/2018 Imaging   CT A/P: 6.7 cm mass in the lower anterior abdominal wall containing cystic and solid components, suspicious for recurrence of previously resected/treated adenocarcinoma in this region. This abuts and could invade the anterior wall of the bladder.   No evidence for more distant metastatic disease within the abdomen or pelvis.    03/28/2018 Surgery   Exploratory laparotomy, resection abdominal wall tumor, oversew of bladder peritoneum, cystoscopy.  Findings: normal external genitalia, unable to palpate mass on exam. After opening the fascia, a cystic and solid mass was encountered below the lower edge of the incision, extending into the space of retzius and adherent to the pubic arch inferiorly, the bladder dome posteriorly, and prior abdominal wall repair anteriorly. At conclusion of the case, no nodularity was palpated in the tumor bed. Visualized bowel and omentum were free of disease. Pelvic peritoenum appeared normal. Cystoscopy revealed intact bladder mucosa with no visible suture. Bilateral ureteral orfices visualized.   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 Additional immunohistochemical studies performed on block A5 in the current specimen demonstrates that the tumor  is positive for Napsin A and PAX-8, and is negative for TTF-1. 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   03/28/2018 Genetic Testing   [facility] No reportable alterations with companion diagnostic claims MS-stable, TMB - 4 Muts/Mb, CREBBP Q5956*   05/21/2018 - 09/08/2018 Chemotherapy   Patient is on Treatment Plan : OVARIAN Carboplatin/Gemcitabine q21d      Pathology Results   A. SOFT TISSUE MASS, PELVIC BODY WALL, NEEDLE CORE BIOPSY:  - Metastatic adenocarcinoma, see comment.   COMMENT:   The tumor has malignant cells with cleared cytoplasm.  Immunohistochemistry is positive for cytokeratin 7, PAX8, racemase, and NapsinA. The cells are negative for ER, WT-1, and CD10. 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 Dr. Benay Spice was notified on 01/08/20.    10/19/2018 Imaging   CT A/P: 1. Previously noted lower anterior abdominal wall mass is no longer identified, presumably surgically resected. Cephalad to the site of the prior mass within the low anterior abdominal wall there is some soft tissue thickening seen on today's examination (axial image 54 of series 2). This is nonspecific and will serve as a baseline for future follow-up examinations. 2. No signs of metastatic disease elsewhere in the abdomen or pelvis. 3. Colonic diverticulosis without evidence of acute diverticulitis at this time. 4. Aortic atherosclerosis. 5. Additional incidental findings, as above.   04/18/2019 Imaging   CT A/P: 1.  No evidence of recurrent or metastatic carcinoma within the abdomen or pelvis. 2. Colonic diverticulosis. No radiographic evidence of diverticulitis.   12/23/2019 Imaging   CT A/P: Within the ventral, midline abdominal wall soft tissue mass is identified anterior to the pubic symphysis measuring 5.2 x 3.3 by 3.4 cm (volume = 31 cm^3), image 59/5 and image 78/2. This is compared with 4.9 x 3.0 by 2.6 (volume = 20)cm IMPRESSION: 1. Stable to slight increase in size of ventral, midline abdominal wall soft tissue mass at the level of the pubic symphysis. 2. No signs of solid organ or nodal metastases. 3. Aortic atherosclerosis.   01/06/2020 Relapse/Recurrence   Anterior abdominal wall mass biopsy: A. SOFT TISSUE MASS, PELVIC BODY WALL, NEEDLE CORE BIOPSY:  - Metastatic adenocarcinoma, see comment.   COMMENT:   The tumor has malignant cells with cleared cytoplasm.  Immunohistochemistry is positive for cytokeratin 7, PAX8, racemase, and  NapsinA. The cells are negative for ER, WT-1, and CD10. The morphology  and immunophenotype are consistent with metastatic clear cell carcinoma  of gynecologic origin (by definition  high grade). Dr. Benay Spice was  notified on 01/08/20.    02/06/2020 Surgery   Exlap with resection of pelvic tumor densely adherent to the anterior aspect of the pubic arch. [facility]  nodular mass palpable, fixed to the pubic arch on exam. On dissection into the mons, an irregularly shaped and nodular tumor was implanted on the pubic arch. No evidence of disease on visualized bowel or omentum. The palpated anterior abdominal wall was free of disease. The bladder dome peritoneum was free of disease. Few soft nodules on the anterior wall peritoneum were ablated using the bovie. No palpable tumor on or surrounding the pubic arch following resection.   02/06/2020 Pathology Results   A: "Pelvic cavity, pubic mons tumor, excision -  Clear cell carcinoma, received fragmented, 5.5  cm in aggregate (see comment) - Carcinoma involves multiple cauterized tissue edges  ER negative, PR 1+ (20%)   04/06/2020 Genetic Testing   Negative genetic testing on the TumorNext-[facility]CancerNext testing.  Somatic testing was negative and no germline mutations identified.  The CancerNext gene panel offered by [employer] includes sequencing and rearrangement analysis for the following 34 genes:   APC, ATM, BARD1, BMPR1A, BRCA1, BRCA2, BRIP1, CDH1, CDK4, CDKN2A, CHEK2, DICER1, HOXB13, EPCAM, GREM1, MLH1, MRE11A, MSH2, MSH6, MUTYH, NBN, NF1, PALB2, PMS2, POLD1, POLE, PTEN, RAD50, RAD51C, RAD51D, SMAD4, SMARCA4, STK11, and TP53.  The report date is April 06, 2020.   05/19/2020 Imaging   CT A/P: Interval resolution of suprapubic abdominal wall mass since prior study. No residual metastatic disease identified. Colonic diverticulosis, without radiographic evidence of diverticulitis. Moderate right and small left hernias.   11/26/2020 Imaging   11/26/20: Status post hysterectomy and bilateral salpingo oophorectomy. No evidence of recurrent or metastatic disease. Additional stable ancillary findings as above.   05/27/2021 Imaging   CT A/P: 1. Stable examination status post hysterectomy and bilateral salpingo-oophorectomy, without evidence of recurrent or metastatic disease within the abdomen or pelvis. 2. Moderate volume of formed stool throughout the colon suggestive of constipation. 3. Sigmoid colonic diverticulosis without findings of acute diverticulitis. 4.  Aortic Atherosclerosis (ICD10-I70.0).   02/28/2022 Imaging   CT A/P: 1. No acute intra-abdominal or pelvic pathology. No evidence of recurrent or metastatic disease. 2. Sigmoid diverticulosis. 3. Progression of broad-based herniation of the bowel into the anterior pelvic wall. No bowel obstruction. 4.  Aortic Atherosclerosis (ICD10-I70.0).   Adenocarcinoma of abdominal wall, unclear primary  08/09/2013 Initial  Diagnosis   Adenocarcinoma of abdominal wall, unclear primary   08/16/2013 - 09/25/2013 Chemotherapy   paclitaxel and carboplatin   11/05/2013 Surgery   resection abdominal wall mass    - 02/27/2014 Radiation Therapy   Completed volume directed radiation   01/06/2020 Relapse/Recurrence   Anterior abdominal wall mass biopsy: A. SOFT TISSUE MASS, PELVIC BODY WALL, NEEDLE CORE BIOPSY:  - Metastatic adenocarcinoma, see comment.   COMMENT:   The tumor has malignant cells with cleared cytoplasm.  Immunohistochemistry is positive for cytokeratin 7, PAX8, racemase, and  NapsinA. The cells are negative for ER, WT-1, and CD10. The morphology  and immunophenotype are consistent with metastatic clear cell carcinoma  of gynecologic origin (by definition  high grade). Dr. Benay Spice was  notified on 01/08/20.    02/06/2020 Surgery   Exlap with resection of pelvic tumor densely adherent to the anterior aspect of the pubic arch. [facility] at [facility]  nodular mass palpable, fixed to the pubic arch on exam. On dissection into the mons, an irregularly shaped and nodular tumor was implanted on the pubic arch. No evidence of disease on visualized bowel or omentum. The palpated anterior abdominal wall was free of disease. The bladder dome peritoneum was free of disease. Few soft nodules on the anterior wall peritoneum were ablated using the bovie. No palpable tumor on or surrounding the pubic arch following resection.   02/06/2020 Pathology Results   A: "Pelvic cavity, pubic mons tumor, excision - Clear cell carcinoma, received fragmented, 5.5 cm in aggregate (see comment) - Carcinoma involves multiple cauterized tissue edges  ER negative, PR 1+ (20%)   Metastatic adenocarcinoma of ovary  05/03/2018 Initial Diagnosis   Malignant neoplasm of ovary ([place])   05/21/2018 - 09/08/2018 Chemotherapy   Patient is on Treatment Plan : OVARIAN Carboplatin/Gemcitabine q21d  Interval History: Doing well.  Denies any  abdominal or pelvic pain.  Denies any increase in symptoms related to small bowel hernias into her bilateral labia.  Continues to use vaginal estrogen.  Denies any bleeding or discharge.  Reports baseline bowel bladder function.  Past Medical/Surgical History: Past Medical History:  Diagnosis Date   Anxiety    new dx   Atrial fibrillation ([place])    Cancer ([place]) 1988, 2015   ovarian, adenocarcinoma   GERD (gastroesophageal reflux disease)    hx of years ago   Hx of radiation therapy 01/16/14-02/27/14   abdomen    Hypercholesteremia    under control with diet and fish oil   Hypertension    Ovarian cyst    PONV (postoperative nausea and vomiting)    Restless leg syndrome    Thyroid disease     Past Surgical History:  Procedure Laterality Date   APPENDECTOMY     APPLICATION OF A-CELL OF CHEST/ABDOMEN N/A 11/05/2013   Procedure: ABDOMINAL WALL RESCONTRUCTION WITH STRATUS;  Surgeon: Irene Limbo, MD;  Location: [facility];  Service: Plastics;  Laterality: N/A;   CHOLECYSTECTOMY  2009   LAPAROTOMY N/A 11/05/2013   Procedure: RESECTION OF PELVIC MASS;  Surgeon: Imagene Gurney A. Alycia Rossetti, MD;  Location: [facility];  Service: Gynecology;  Laterality: N/A;   LAPAROTOMY  03/2018   excision of suprapubic mass at [place]  as child   TOTAL ABDOMINAL HYSTERECTOMY  1988   ovarian cyst  BSO    Family History  Problem Relation Age of Onset   Heart disease Mother    Heart disease Sister    Diabetes Sister    Cancer Sister        melanoma-skin cancer   Breast cancer Paternal 32        Age 7's   Cancer Paternal Grandmother        breast   Cancer Paternal Grandfather        prostate/bladder   Colon cancer Neg Hx    Ovarian cancer Neg Hx    Endometrial cancer Neg Hx    Pancreatic cancer Neg Hx    Prostate cancer Neg Hx     Social History   Socioeconomic History   Marital status: Single    Spouse name: Not on file   Number of children: Not on file    Years of education: Not on file   Highest education level: Not on file  Occupational History   Not on file  Tobacco Use   Smoking status: Never   Smokeless tobacco: Never  Vaping Use   Vaping Use: Never used  Substance and Sexual Activity   Alcohol use: No    Alcohol/week: 0.0 standard drinks of alcohol   Drug use: No   Sexual activity: Never    Birth control/protection: Surgical    Comment: HYST  Other Topics Concern   Not on file  Social History Narrative   Single-never married   No children or pets   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   Enjoys reading, keeps house tidy, plays in Teacher, English as a foreign language choir at [employer] Determinants of Radio broadcast assistant Strain: Not on file  Food Insecurity: Not on file  Transportation Needs: Not on file  Physical Activity: Not on file  Stress: Not on file  Social Connections: Not on file    Current Medications:  Current Outpatient Medications:  apixaban (ELIQUIS) 5 MG TABS tablet, TAKE (1) TABLET BY MOUTH TWICE DAILY., Disp: 180 tablet, Rfl: 1   cholecalciferol (VITAMIN D3) 25 MCG (1000 UNIT) tablet, Take 1,000 Units by mouth daily., Disp: , Rfl:    conjugated estrogens (PREMARIN) vaginal cream, Finger tip amount of cream, applied to vulva and outer vagina at night 3 x a week, Disp: 42.5 g, Rfl: 6   HYDROcodone-acetaminophen (NORCO/VICODIN) 5-325 MG tablet, Take 1 tablet by mouth 3 (three) times daily as needed., Disp: , Rfl:    levothyroxine (SYNTHROID) 50 MCG tablet, Take 50 mcg by mouth daily., Disp: , Rfl:    metoprolol tartrate (LOPRESSOR) 25 MG tablet, TAKE (1/2) TABLET BY MOUTH TWICE DAILY., Disp: 90 tablet, Rfl: 3   Multiple Vitamin (MULTIVITAMIN WITH MINERALS) TABS tablet, Take 1 tablet by mouth daily., Disp: , Rfl:    pramipexole (MIRAPEX) 0.125 MG tablet, Take 0.125 mg by mouth 2 (two) times daily., Disp: , Rfl:   Review of Systems: Denies appetite changes, fevers, chills, fatigue,  unexplained weight changes. Denies hearing loss, neck lumps or masses, mouth sores, ringing in ears or voice changes. Denies cough or wheezing.  Denies shortness of breath. Denies chest pain or palpitations. Denies leg swelling. Denies abdominal distention, pain, blood in stools, constipation, diarrhea, nausea, vomiting, or early satiety. Denies pain with intercourse, dysuria, frequency, hematuria or incontinence. Denies hot flashes, pelvic pain, vaginal bleeding or vaginal discharge.   Denies joint pain, back pain or muscle pain/cramps. Denies itching, rash, or wounds. Denies dizziness, headaches, numbness or seizures. Denies swollen lymph nodes or glands, denies easy bruising or bleeding. Denies anxiety, depression, confusion, or decreased concentration.  Physical Exam: BP (!) 111/53 (BP Location: Right Arm, Patient Position: Sitting)   Pulse (!) 58   Temp 97.7 F (36.5 C) (Oral)   Resp 16   Ht 5\' 6"  (1.676 m)   Wt 133 lb 6.4 oz (60.5 kg)   SpO2 100%   BMI 21.53 kg/m  General: Alert, oriented, no acute distress. HEENT: Normocephalic, atraumatic, sclera anicteric. Chest: Clear to auscultation bilaterally.  No wheezes or rhonchi. Cardiovascular: Regular rate and rhythm, no murmurs. Abdomen: soft, nontender.  Normoactive bowel sounds.  No masses or hepatosplenomegaly appreciated.  Well-healed incisions.  Extremities: Grossly normal range of motion.  Warm, well perfused.  No edema bilaterally. Skin: No rashes or lesions noted. Lymphatics: No cervical, supraclavicular, or inguinal adenopathy. GU: Edema of the mons consistent with known hernias, right greater than left.  There appears to maybe have been some mild decrease in the size of her right hernia since her last visit.  There is mild erythema between the labia minora and majora bilaterally.  This is more prominent on the right with some petechia along the inner right labia.  Loss of architecture of bilateral labia noted as well as  atrophy.   Speculum exam not well-tolerated although better tolerated than previously, vaginal mucosa atrophic, no lesions noted.  Bimanual exam with single digit reveals no masses or nodularity.  Rectovaginal exam confirms these findings.  Laboratory & Radiologic Studies: Component Ref Range & Units 3 wk ago (06/02/22) 3 mo ago (03/15/22) 6 mo ago (12/02/21) 9 mo ago (08/30/21) 1 yr ago (05/25/21) 1 yr ago (03/01/21) 1 yr ago (12/03/20)  Cancer Antigen (CA) 125 0.0 - 38.1 U/mL 20.3 26.2 CM 20.3 CM 21.4 CM 22.7 CM 22.5 CM 19.2 CM    Assessment & Plan: [name] is a 76 y.o. 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 Completed radiation in 04/2020.   The patient is doing well today and is NED on exam.  She had a recent CA-125 (normal). Plan for surveillance imaging 6 months after her October scan - already scheduled on 4/10.    Patient will continue using vaginal estrogen for vaginal and vulvar atrophy.   She continues to remain asymptomatic with regards to her hernia.  We may revisit this once she is further out from her last recurrence, but I suspect given the complicated nature of this repair, that deferring repair will continue to be recommended as long as the patient is asymptomatic. She previously saw Dr. Rosendo Gros who recommended expectant management unless she were to become symptomatic.     In terms of follow-up, we discussed signs and symptoms that should prompt a phone call to see me sooner than her next scheduled visit.  She is more than 2 years from completion of adjuvant therapy for her last recurrence. We will continue with visits every 4 months.  I will see her next at the end of June.  20 minutes of total time was spent for this patient encounter, including preparation, face-to-face counseling  with the patient and coordination of care, and documentation of the encounter.  Jeral Pinch, MD  Division of Gynecologic Oncology  Department of Obstetrics and Gynecology  [facility]

## 2022-07-29 DIAGNOSIS — G4700 Insomnia, unspecified: Secondary | ICD-10-CM | POA: Diagnosis not present

## 2022-07-29 DIAGNOSIS — N390 Urinary tract infection, site not specified: Secondary | ICD-10-CM | POA: Diagnosis not present

## 2022-07-29 DIAGNOSIS — Z6821 Body mass index (BMI) 21.0-21.9, adult: Secondary | ICD-10-CM | POA: Diagnosis not present

## 2022-08-24 ENCOUNTER — Ambulatory Visit (HOSPITAL_COMMUNITY)
Admission: RE | Admit: 2022-08-24 | Discharge: 2022-08-24 | Disposition: A | Discharge status: Home / Self Care | Payer: PPO | Source: Ambulatory Visit | Attending: Gynecologic Oncology | Admitting: Gynecologic Oncology

## 2022-08-24 ENCOUNTER — Encounter (HOSPITAL_COMMUNITY): Payer: Self-pay

## 2022-08-24 DIAGNOSIS — N281 Cyst of kidney, acquired: Secondary | ICD-10-CM | POA: Insufficient documentation

## 2022-08-24 DIAGNOSIS — C801 Malignant (primary) neoplasm, unspecified: Secondary | ICD-10-CM | POA: Diagnosis present

## 2022-08-24 DIAGNOSIS — K439 Ventral hernia without obstruction or gangrene: Secondary | ICD-10-CM | POA: Insufficient documentation

## 2022-08-24 DIAGNOSIS — C569 Malignant neoplasm of unspecified ovary: Secondary | ICD-10-CM | POA: Diagnosis not present

## 2022-08-24 DIAGNOSIS — I700 Atherosclerosis of aorta: Secondary | ICD-10-CM | POA: Diagnosis not present

## 2022-08-24 DIAGNOSIS — K5730 Diverticulosis of large intestine without perforation or abscess without bleeding: Secondary | ICD-10-CM | POA: Diagnosis not present

## 2022-08-24 MED ORDER — IOHEXOL 300 MG/ML  SOLN
100.0000 mL | Freq: Once | INTRAMUSCULAR | Status: AC | PRN
  Administered 2022-08-24: 100 mL via INTRAVENOUS

## 2022-08-24 MED ORDER — IOHEXOL 9 MG/ML PO SOLN
1000.0000 mL | ORAL | Status: AC
  Administered 2022-08-24: 1000 mL via ORAL

## 2022-08-24 MED ORDER — IOHEXOL 9 MG/ML PO SOLN
ORAL | Status: AC
  Filled 2022-08-24: qty 1000

## 2022-08-24 MED ORDER — SODIUM CHLORIDE (PF) 0.9 % IJ SOLN
INTRAMUSCULAR | Status: AC
  Filled 2022-08-24: qty 50

## 2022-08-26 ENCOUNTER — Telehealth: Payer: Self-pay | Admitting: Gynecologic Oncology

## 2022-08-26 NOTE — Telephone Encounter (Signed)
Called the patient. Reviewed CT findings - patient happy with this news.  Eugene Garnet MD Gynecologic Oncology

## 2022-09-16 DIAGNOSIS — Z6821 Body mass index (BMI) 21.0-21.9, adult: Secondary | ICD-10-CM | POA: Diagnosis not present

## 2022-09-16 DIAGNOSIS — G894 Chronic pain syndrome: Secondary | ICD-10-CM | POA: Diagnosis not present

## 2022-10-27 ENCOUNTER — Encounter: Payer: Self-pay | Admitting: Gynecologic Oncology

## 2022-10-27 ENCOUNTER — Other Ambulatory Visit: Payer: Self-pay | Admitting: Gynecologic Oncology

## 2022-10-27 DIAGNOSIS — C801 Malignant (primary) neoplasm, unspecified: Secondary | ICD-10-CM

## 2022-10-28 ENCOUNTER — Other Ambulatory Visit: Payer: Self-pay

## 2022-10-28 ENCOUNTER — Inpatient Hospital Stay: Payer: PPO | Attending: Gynecologic Oncology | Admitting: Gynecologic Oncology

## 2022-10-28 ENCOUNTER — Inpatient Hospital Stay: Payer: PPO

## 2022-10-28 VITALS — BP 122/50 | HR 62 | Temp 98.0°F

## 2022-10-28 DIAGNOSIS — Z90722 Acquired absence of ovaries, bilateral: Secondary | ICD-10-CM | POA: Diagnosis not present

## 2022-10-28 DIAGNOSIS — Z8543 Personal history of malignant neoplasm of ovary: Secondary | ICD-10-CM | POA: Insufficient documentation

## 2022-10-28 DIAGNOSIS — Z9221 Personal history of antineoplastic chemotherapy: Secondary | ICD-10-CM | POA: Diagnosis not present

## 2022-10-28 DIAGNOSIS — Z08 Encounter for follow-up examination after completed treatment for malignant neoplasm: Secondary | ICD-10-CM | POA: Insufficient documentation

## 2022-10-28 DIAGNOSIS — Z90710 Acquired absence of both cervix and uterus: Secondary | ICD-10-CM | POA: Diagnosis not present

## 2022-10-28 DIAGNOSIS — N952 Postmenopausal atrophic vaginitis: Secondary | ICD-10-CM | POA: Insufficient documentation

## 2022-10-28 DIAGNOSIS — Z79890 Hormone replacement therapy (postmenopausal): Secondary | ICD-10-CM | POA: Diagnosis not present

## 2022-10-28 DIAGNOSIS — Z923 Personal history of irradiation: Secondary | ICD-10-CM | POA: Insufficient documentation

## 2022-10-28 DIAGNOSIS — C801 Malignant (primary) neoplasm, unspecified: Secondary | ICD-10-CM

## 2022-10-28 DIAGNOSIS — C578 Malignant neoplasm of overlapping sites of female genital organs: Secondary | ICD-10-CM

## 2022-10-28 NOTE — Progress Notes (Unsigned)
No complaints. Tolerating diet. No new smyptosm.

## 2022-10-28 NOTE — Patient Instructions (Addendum)
It was good to see you today.  I do not see or feel any evidence of cancer recurrence on your exam.  Plan to follow up in October 2024 with a CT prior to this visit.    As always, if you develop any new and concerning symptoms before your next visit, please call to see me sooner.  Symptoms to report to your health care team include vaginal bleeding, rectal bleeding, bloating, weight loss without effort, new and persistent pain, new and  persistent fatigue, new leg swelling, new masses (i.e., bumps in your neck or groin), new and persistent cough, new and persistent nausea and vomiting, change in bowel or bladder habits, and any other concerns.

## 2022-10-29 LAB — CA 125: Cancer Antigen (CA) 125: 37 U/mL (ref 0.0–38.1)

## 2022-10-31 ENCOUNTER — Telehealth: Payer: Self-pay | Admitting: Surgery

## 2022-10-31 ENCOUNTER — Other Ambulatory Visit: Payer: Self-pay | Admitting: Gynecologic Oncology

## 2022-10-31 DIAGNOSIS — C577 Malignant neoplasm of other specified female genital organs: Secondary | ICD-10-CM

## 2022-10-31 DIAGNOSIS — C801 Malignant (primary) neoplasm, unspecified: Secondary | ICD-10-CM

## 2022-10-31 DIAGNOSIS — R978 Other abnormal tumor markers: Secondary | ICD-10-CM

## 2022-10-31 NOTE — Telephone Encounter (Signed)
Called patient to review below CA 125 results and LVM to call office back.

## 2022-10-31 NOTE — Telephone Encounter (Signed)
Patient returned call and was given below lab results. Advised that she can have repeat CA 125 in one month or she can do another scan now to reevaluate. Patient opted to have scan now. Patient advised we would get it scheduled and call her back with the date and time. Patient requested scan be scheduled at [facility] if possible.

## 2022-10-31 NOTE — Telephone Encounter (Signed)
-----   Message from Doylene Bode, NP sent at 10/31/2022  8:27 AM EDT ----- Please let the patient know her CA 125 level is at 37. It was around 20 in January 2024. Her last scan was in April. Given the increase in the CA 125, we can get another scan now to further evaluate or plan for a repeat CA 125 level in around 1 month to see where she is at. Whatever she feels more comfortable doing. We can also wait for Dr. Pricilla Holm to return in one week to see what would prefer for her as well.  ----- Message ----- From: Interface, Lab [facility] [place] Sent: 10/29/2022   4:38 PM EDT To: Doylene Bode, NP

## 2022-11-04 ENCOUNTER — Encounter (HOSPITAL_COMMUNITY): Payer: Self-pay

## 2022-11-04 ENCOUNTER — Ambulatory Visit (HOSPITAL_COMMUNITY)
Admission: RE | Admit: 2022-11-04 | Discharge: 2022-11-04 | Disposition: A | Discharge status: Home / Self Care | Payer: PPO | Source: Ambulatory Visit | Attending: Gynecologic Oncology | Admitting: Gynecologic Oncology

## 2022-11-04 DIAGNOSIS — C577 Malignant neoplasm of other specified female genital organs: Secondary | ICD-10-CM

## 2022-11-04 DIAGNOSIS — C801 Malignant (primary) neoplasm, unspecified: Secondary | ICD-10-CM | POA: Diagnosis not present

## 2022-11-04 DIAGNOSIS — R978 Other abnormal tumor markers: Secondary | ICD-10-CM | POA: Diagnosis not present

## 2022-11-04 DIAGNOSIS — K439 Ventral hernia without obstruction or gangrene: Secondary | ICD-10-CM | POA: Diagnosis not present

## 2022-11-04 DIAGNOSIS — K5730 Diverticulosis of large intestine without perforation or abscess without bleeding: Secondary | ICD-10-CM | POA: Diagnosis not present

## 2022-11-04 DIAGNOSIS — C569 Malignant neoplasm of unspecified ovary: Secondary | ICD-10-CM | POA: Diagnosis not present

## 2022-11-04 MED ORDER — IOHEXOL 9 MG/ML PO SOLN
ORAL | Status: AC
  Filled 2022-11-04: qty 1000

## 2022-11-04 MED ORDER — IOHEXOL 300 MG/ML  SOLN
100.0000 mL | Freq: Once | INTRAMUSCULAR | Status: AC | PRN
  Administered 2022-11-04: 100 mL via INTRAVENOUS

## 2022-11-04 MED ORDER — IOHEXOL 9 MG/ML PO SOLN
500.0000 mL | ORAL | Status: AC
  Administered 2022-11-04: 1000 mL via ORAL

## 2022-11-04 MED ORDER — SODIUM CHLORIDE (PF) 0.9 % IJ SOLN
INTRAMUSCULAR | Status: AC
  Filled 2022-11-04: qty 50

## 2022-11-09 ENCOUNTER — Other Ambulatory Visit: Payer: Self-pay | Admitting: Gynecologic Oncology

## 2022-11-09 DIAGNOSIS — C801 Malignant (primary) neoplasm, unspecified: Secondary | ICD-10-CM

## 2022-11-09 NOTE — Progress Notes (Signed)
Called patient - discussed CT findings. Recommended PET to see if soft tissue density is FDG avid. On prior imaging, it has been there for at least the last several scans.  Eugene Garnet MD Gynecologic Oncology

## 2022-11-10 ENCOUNTER — Telehealth: Payer: Self-pay | Admitting: *Deleted

## 2022-11-10 NOTE — Telephone Encounter (Signed)
Per Dr Pricilla Holm pt scheduled for a PET scan appt on 7/12 at 7:20. Patient given date and time along with the instructions nothing to eat or drink after midnight

## 2022-11-16 DIAGNOSIS — C482 Malignant neoplasm of peritoneum, unspecified: Secondary | ICD-10-CM | POA: Diagnosis not present

## 2022-11-16 DIAGNOSIS — I4891 Unspecified atrial fibrillation: Secondary | ICD-10-CM | POA: Diagnosis not present

## 2022-11-16 DIAGNOSIS — G894 Chronic pain syndrome: Secondary | ICD-10-CM | POA: Diagnosis not present

## 2022-11-16 DIAGNOSIS — E039 Hypothyroidism, unspecified: Secondary | ICD-10-CM | POA: Diagnosis not present

## 2022-11-16 DIAGNOSIS — Z6820 Body mass index (BMI) 20.0-20.9, adult: Secondary | ICD-10-CM | POA: Diagnosis not present

## 2022-11-16 DIAGNOSIS — F419 Anxiety disorder, unspecified: Secondary | ICD-10-CM | POA: Diagnosis not present

## 2022-11-16 DIAGNOSIS — G4709 Other insomnia: Secondary | ICD-10-CM | POA: Diagnosis not present

## 2022-11-16 DIAGNOSIS — G2581 Restless legs syndrome: Secondary | ICD-10-CM | POA: Diagnosis not present

## 2022-11-25 ENCOUNTER — Ambulatory Visit (HOSPITAL_COMMUNITY)
Admission: RE | Admit: 2022-11-25 | Discharge: 2022-11-25 | Disposition: A | Discharge status: Home / Self Care | Payer: PPO | Source: Ambulatory Visit | Attending: Gynecologic Oncology | Admitting: Gynecologic Oncology

## 2022-11-25 DIAGNOSIS — I2510 Atherosclerotic heart disease of native coronary artery without angina pectoris: Secondary | ICD-10-CM | POA: Diagnosis not present

## 2022-11-25 DIAGNOSIS — C801 Malignant (primary) neoplasm, unspecified: Secondary | ICD-10-CM | POA: Diagnosis not present

## 2022-11-25 LAB — GLUCOSE, CAPILLARY: Glucose-Capillary: 94 mg/dL (ref 70–99)

## 2022-11-25 MED ORDER — FLUDEOXYGLUCOSE F - 18 (FDG) INJECTION
6.6000 | Freq: Once | INTRAVENOUS | Status: AC
  Administered 2022-11-25: 6.6 via INTRAVENOUS

## 2022-11-30 ENCOUNTER — Telehealth: Payer: Self-pay | Admitting: Gynecologic Oncology

## 2022-11-30 NOTE — Telephone Encounter (Signed)
Attempted to call the patient to discuss PET. Called several times, phone busy.  Eugene Garnet MD Gynecologic Oncology

## 2022-12-01 ENCOUNTER — Inpatient Hospital Stay: Payer: PPO | Attending: Gynecologic Oncology

## 2022-12-01 ENCOUNTER — Inpatient Hospital Stay: Payer: PPO | Admitting: Oncology

## 2022-12-01 VITALS — BP 124/63 | HR 69 | Temp 97.9°F | Resp 18 | Ht 66.0 in | Wt 132.5 lb

## 2022-12-01 DIAGNOSIS — C569 Malignant neoplasm of unspecified ovary: Secondary | ICD-10-CM

## 2022-12-01 DIAGNOSIS — I4891 Unspecified atrial fibrillation: Secondary | ICD-10-CM | POA: Diagnosis not present

## 2022-12-01 DIAGNOSIS — R1909 Other intra-abdominal and pelvic swelling, mass and lump: Secondary | ICD-10-CM | POA: Insufficient documentation

## 2022-12-01 DIAGNOSIS — Z87440 Personal history of urinary (tract) infections: Secondary | ICD-10-CM | POA: Diagnosis not present

## 2022-12-01 DIAGNOSIS — Z8543 Personal history of malignant neoplasm of ovary: Secondary | ICD-10-CM | POA: Insufficient documentation

## 2022-12-01 DIAGNOSIS — G2581 Restless legs syndrome: Secondary | ICD-10-CM | POA: Diagnosis not present

## 2022-12-01 DIAGNOSIS — Z7901 Long term (current) use of anticoagulants: Secondary | ICD-10-CM | POA: Diagnosis not present

## 2022-12-01 NOTE — Progress Notes (Signed)
[facility] OFFICE PROGRESS NOTE  Diagnosis: Ovarian cancer  INTERVAL HISTORY:   Ms. Freestone returns as scheduled.  She feels well.  Good appetite.  No difficulty with bowel function.  No consistent pain.  No complaint.  When she saw Dr. Pricilla Holm 10/28/2022 the CA125 was at the upper end of the normal range, 37. She was referred for a CT of the abdomen and pelvis on 11/04/2022.  Was mild increase in the size of a 3 cm soft tissue density at the inferior aspect of the suprapubic hernia and anterior margin of the right pubic bone. A PET 11/30/2022 revealed a hypermetabolic soft tissue mass at the anterior margin of the left inferior pubic ramus with an adjacent hypermetabolic left inguinal node.  No evidence of distant metastatic disease.  Objective:  Vital signs in last 24 hours:  Blood pressure 124/63, pulse 69, temperature 97.9 F (36.6 C), temperature source Oral, resp. rate 18, height 5\' 6"  (1.676 m), weight 132 lb 8 oz (60.1 kg), SpO2 100%.    Lymphatics: No cervical, supraclavicular, axillary, or inguinal nodes Resp: Lungs clear bilaterally Cardio: Regular rate and rhythm GI: No hepatosplenomegaly, no apparent ascites, no mass, reducible hernias in the right greater than left suprapubic area Vascular: No leg edema   Lab Results:  Lab Results  Component Value Date   WBC 6.2 05/06/2020   HGB 12.3 05/06/2020   HCT 36.1 05/06/2020   MCV 95.8 05/06/2020   PLT 209 05/06/2020   NEUTROABS 4.0 05/06/2020    CMP  Lab Results  Component Value Date   NA 142 05/25/2021   K 4.7 05/25/2021   CL 106 05/25/2021   CO2 31 05/25/2021   GLUCOSE 110 (H) 05/25/2021   BUN 21 05/25/2021   CREATININE 0.89 05/25/2021   CALCIUM 10.1 05/25/2021   PROT 6.6 05/25/2021   ALBUMIN 4.0 05/25/2021   AST 30 05/25/2021   ALT 31 05/25/2021   ALKPHOS 49 05/25/2021   BILITOT 0.3 05/25/2021   GFRNONAA >60 05/25/2021   GFRAA >60 12/25/2019    Lab Results  Component Value Date   CA125  10 03/17/2016    Medications: I have reviewed the patient's current medications.   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   Elevated CA 125.   Cycle 1 Taxol/carboplatin 08/16/2013.   Cycle 2 Taxol/carboplatin 09/06/2013.   Cycle 3 Taxol/carboplatin 09/27/2013   CT 10/11/2013 with a stable abdominal wall mass. Status post resection of abdominal wall mass with abdominal wall reconstruction 11/05/2013. Final pathology showed adenocarcinoma. Specimen extensively involved with adenocarcinoma which focally involved the edge of the specimen. Immunostains and Foundation 1 testing were nonspecific for a primary tumor location. Adjuvant radiation to the abdominal wall 01/16/2014 through 02/27/2014 CT 01/15/2018 while in the emergency room for evaluation of back pain- midline abdominal wall mass above the pubic symphysis and adjacent to the bladder Ultrasound-guided biopsy of the abdominal wall mass 01/25/2018-adenocarcinoma similar to the 2015 biopsy PET scan 03/01/2018-hypermetabolism associated with a cystic and solid suprapubic mass.  Focal uptake identified in the left L3-4 facets without underlying bony lesion.  No other sites of unexpected or suspicious hypermetabolism in the neck, chest, abdomen or pelvis. Exploratory laparotomy, resection of abdominal wall tumor, oversew of bladder peritoneum, cystoscopy 03/28/2018 (findings included a cystic and solid mass encountered below the lower edge of the incision extending into  the space of retzius and adherent to the pubic arch inferiorly, bladder dome posteriorly and prior abdominal wall repair anteriorly.  No nodularity palpated in the tumor bed.  Visualized bowel and omentum free of disease.  Pelvic peritoneum appeared normal).  A "small "amount of tumor on the periosteum was cauterized using a  Bovie. Pathology on pelvic tumor-metastatic high-grade adenocarcinoma, favor clear-cell carcinoma of gynecologic origin, fragments up to 5.8 cm in size; ER negative, PR positive, 1+ (weak staining) to focally 2+, 30-40%, MSI-stable, mutation burden-4,CREBBP alteration Cycle 1 gemcitabine/carboplatin 05/21/2018; day 8 held due to neutropenia Cycle 2 gemcitabine/carboplatin 06/11/2018 (day 8 gemcitabine eliminated) Cycle 3 gemcitabine/carboplatin 07/06/2018 Cycle 4 gemcitabine/carboplatin 07/27/2018 (Udenyca added) Cycle 5 gemcitabine/carboplatin 08/17/2018 Cycle 6 gemcitabine/carboplatin 09/07/2018  CT 10/19/2018- low anterior abdominal wall mass no longer identified, soft tissue thickening cephalad to the site of prior mass-nonspecific, no additional evidence of metastatic disease CT 04/18/2019-no evidence of recurrent or metastatic disease CT 12/23/2019-slight increase in size of ventral midline soft tissue mass near the pubis, no other evidence of disease progression Biopsy pelvic body wall soft tissue mass 01/06/2020-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 Surgical excision of the soft tissue mass adherent to the pubis 02/06/2020-clear cell carcinoma, positive surgical margins Adjuvant radiation 03/25/2020- 04/29/2020 CT abdomen/pelvis 05/19/2020- resolution of suprapubic abdominal wall mass, no evidence of metastatic disease, moderate right and small left hernias CT abdomen/pelvis 11/26/2020-no recurrence of the lower pelvic soft tissue mass, small bilateral lower pelvic hernias, no evidence of recurrent or metastatic disease CT abdomen/pelvis 05/27/2021-no evidence of recurrent disease CT abdomen/pelvis 02/28/2022-no evidence of recurrent disease progressive bowel herniation in the anterior pelvic wall CT abdomen/pelvis 08/24/2022-no evidence of recurrent disease CT abdomen/pelvis 11/04/2022-mild increase in size of a soft tissue density at the inferior  aspect of the suprapubic hernia and anterior margin of the right pubic bone PET 11/30/2022-hypermetabolic soft tissue mass at the anterior margin of the left inferior pubic ramus, hypermetabolic adjacent left inguinal node, no evidence of distant metastatic disease Ovarian cancer in 1988, low-grade adenocarcinoma with areas of "borderline" carcinoma, status post a hysterectomy and bilateral oophorectomy. New onset atrial fibrillation August 2016, maintained on eliquis Recurrent urinary tract infections Restless legs     Disposition: Ms. 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  She was diagnosed with a low-grade ovarian cancer 1998 and metastatic adenocarcinoma involving abdominal wall mass in 2015.  She has undergone systemic therapy, radiation, and resection procedures over the past several years.  The CEA is now at the upper end of the normal range and a restaging CT and PET are suggestive of local disease progression.  I discussed the CT/PET findings with Ms. Gilday and her sister.  We reviewed the CT and pet images.  She understands the high likelihood the hypermetabolic lesions on PET represent progression of the metastatic adenocarcinoma.  We discussed obtaining a diagnostic biopsy.  We also discussed treatment options including observation, systemic therapy, and surgical resection.  I will consult with Dr. Pricilla Holm to formulate a treatment plan.  Ms. Callan will return for an office visit in 1 month.  We will see her sooner as needed.  Thornton Papas, MD  12/01/2022  10:59 AM

## 2022-12-02 ENCOUNTER — Telehealth: Payer: Self-pay | Admitting: Gynecologic Oncology

## 2022-12-02 ENCOUNTER — Encounter: Payer: Self-pay | Admitting: Oncology

## 2022-12-02 LAB — CA 125: Cancer Antigen (CA) 125: 39.1 U/mL — ABNORMAL HIGH (ref 0.0–38.1)

## 2022-12-02 NOTE — Telephone Encounter (Signed)
Called the patient to review PET scan. Discussed with the patient findings - area along right aspect of the pubic symphysis is not hypermetabolic (may be bladder?) but the medial insertion of the abductor muscles/obturator internus and adjacent LN on the left that are metabolically active on PET. Going back to CT, this is visible but changes subtle. Discussed treatment options including surgery (complex given location, muscle involved), radiation (would like to review with radiation oncology if SBRT would be option), or systemic treatment. Will request HER2 on most recent tumor. Will review at tumor board.   Patient would like to avoid surgery if possible.   Eugene Garnet MD Gynecologic Oncology

## 2022-12-02 NOTE — Progress Notes (Signed)
Requested Her2 testing on accession [id number] with [facility] via email.

## 2022-12-05 ENCOUNTER — Other Ambulatory Visit: Payer: Self-pay | Admitting: Oncology

## 2022-12-05 DIAGNOSIS — C569 Malignant neoplasm of unspecified ovary: Secondary | ICD-10-CM

## 2022-12-05 NOTE — Progress Notes (Signed)
Gynecologic Oncology Multi-Disciplinary Disposition Conference Note  Date of the Conference: 12/05/2022  Patient Name: [name]  Primary GYN Oncologist: Dr. Pricilla Holm   Stage/Disposition:  High grade adenocarcinoma of Gyn origin. Disposition is to repeat biopsy followed by repeat FoundationOne testing. Also referral to radiation oncology.   This Multidisciplinary conference took place involving physicians from Gynecologic Oncology, Medical Oncology, Radiation Oncology, Pathology, Radiology along with the Gynecologic Oncology Nurse Practitioner and Gynecologic Oncology Nurse Navigator.  Comprehensive assessment of the patient's malignancy, staging, need for surgery, chemotherapy, radiation therapy, and need for further testing were reviewed. Supportive measures, both inpatient and following discharge were also discussed. The recommended plan of care is documented. Greater than 35 minutes were spent correlating and coordinating this patient's care.

## 2022-12-06 ENCOUNTER — Telehealth: Payer: Self-pay | Admitting: Radiation Oncology

## 2022-12-06 LAB — SURGICAL PATHOLOGY

## 2022-12-06 NOTE — Telephone Encounter (Signed)
7/23 @ 9:35 am Left voicemail for patient to call our office to be reschedule for earlier time -per Laurence Aly.

## 2022-12-07 ENCOUNTER — Encounter: Payer: Self-pay | Admitting: Radiation Oncology

## 2022-12-07 ENCOUNTER — Ambulatory Visit: Payer: PPO | Admitting: Radiation Oncology

## 2022-12-07 ENCOUNTER — Ambulatory Visit
Admission: RE | Admit: 2022-12-07 | Discharge: 2022-12-07 | Disposition: A | Discharge status: Home / Self Care | Payer: PPO | Source: Ambulatory Visit | Attending: Radiation Oncology | Admitting: Radiation Oncology

## 2022-12-07 ENCOUNTER — Ambulatory Visit: Payer: PPO

## 2022-12-07 VITALS — Ht 66.0 in | Wt 132.0 lb

## 2022-12-07 DIAGNOSIS — C801 Malignant (primary) neoplasm, unspecified: Secondary | ICD-10-CM

## 2022-12-07 DIAGNOSIS — C569 Malignant neoplasm of unspecified ovary: Secondary | ICD-10-CM

## 2022-12-07 DIAGNOSIS — C494 Malignant neoplasm of connective and soft tissue of abdomen: Secondary | ICD-10-CM | POA: Diagnosis not present

## 2022-12-07 DIAGNOSIS — C44509 Unspecified malignant neoplasm of skin of other part of trunk: Secondary | ICD-10-CM

## 2022-12-07 NOTE — Therapy (Signed)
OUTPATIENT PHYSICAL THERAPY  LOWER EXTREMITY ONCOLOGY EVALUATION  Patient Name: [name] MRN: [id number] DOB:[birth year], 77 y.o., female Today's Date: 12/08/2022  END OF SESSION:  PT End of Session - 12/08/22 1313     Visit Number 1    Number of Visits 2    Date for PT Re-Evaluation 02/02/23    PT Start Time 1104    PT Stop Time 1155    PT Time Calculation (min) 51 min    Activity Tolerance Patient tolerated treatment well    Behavior During Therapy [facility] for tasks assessed/performed             Past Medical History:  Diagnosis Date   Anxiety    new dx   Atrial fibrillation (HCC)    Cancer (HCC) 1988, 2015   ovarian, adenocarcinoma   GERD (gastroesophageal reflux disease)    hx of years ago   Hx of radiation therapy 01/16/14-02/27/14   abdomen    Hypercholesteremia    under control with diet and fish oil   Hypertension    Ovarian cyst    PONV (postoperative nausea and vomiting)    Restless leg syndrome    Thyroid disease    Past Surgical History:  Procedure Laterality Date   APPENDECTOMY     APPLICATION OF A-CELL OF CHEST/ABDOMEN N/A 11/05/2013   Procedure: ABDOMINAL WALL RESCONTRUCTION WITH STRATUS;  Surgeon: Glenna Fellows, MD;  Location: [facility];  Service: Plastics;  Laterality: N/A;   CHOLECYSTECTOMY  2009   LAPAROTOMY N/A 11/05/2013   Procedure: RESECTION OF PELVIC MASS;  Surgeon: Rejeana Brock A. 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   Abdominal wall mass of suprapubic region 08/20/2013   Adenocarcinoma of abdominal wall, unclear primary 08/09/2013   Neoplasm of abdominal wall of uncertain behavior 07/23/2013     REFERRING PROVIDER: Ronny Bacon PA-C  REFERRING DIAG: Pending Right LE LN biopsy,   THERAPY DIAG:  Malignant neoplasm of abdominal wall  Malignant neoplasm of right ovary (HCC)  ONSET DATE: 11/25/2022  Rationale for Evaluation and Treatment: Rehabilitation  SUBJECTIVE:  SUBJECTIVE STATEMENT:  Patient reports she is here today to be seen by her medical team for a possible recurrent cancer which requires a LN  biopsy to further identify and likely radiation.Marland Kitchen  PERTINENT HISTORY:  Diagnosed 1988 at time of hysterectomy with carcinoma of left ovary. In 2015 diagnosed with abdominal wall reoccurence in right rectus muscle and she had 3 rounds of chemo with no response. 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  She received carboplatin and Gemzar therapy. 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 She went on to receive palliative radiotherapy which  she completed in December 2021 to the tumor within and involving the pelvis. A PET on 11/30/22 showed hypermetabolic activity in the anterior margin of the left inferior pubic ramus and left inguinal node.  Dr. Mitzi Hansen would like to biopsy a groin LN to be sure it is recurrent disease, and if so 2-5 weeks of radiation.She's seen to consider additional radiotherapy to the pelvis and left groin region.         PAIN:  Are you having pain? Yes, Right hip NPRS scale: 2/10 Pain location: right anterior hip Pain orientation: Right  PAIN TYPE: heaviness, tightness Pain description: intermittent  Aggravating factors: nothing Relieving factors: nothing, just occurs once in a while  PRECAUTIONS: Active CA, LE lymphedema risk, 2 large lower hernias from prior surgery  RED FLAGS: None   WEIGHT BEARING RESTRICTIONS: No  FALLS:  Has patient fallen in last 6 months? Yes. Number of falls 2  LIVING ENVIRONMENT: Lives with: lives alone Lives in: House/apartment Stairs: Yes; External: 3 steps; none  OCCUPATION: retired  LEISURE: out to eat, [place], read,church, piano  PRIOR LEVEL OF FUNCTION: Independent  PATIENT GOALS: learn about risk reduction for lymphedema,   OBJECTIVE:  COGNITION: Overall cognitive status: Within functional limits for tasks assessed   PALPATION: NA  OBSERVATIONS / OTHER ASSESSMENTS: pt has 2 lower abdominal hernias  SENSATION: Light touch: Appears intact  POSTURE: forward head, rounded shoulders  LOWER EXTREMITY STRENGTH:  MMT Right eval  Hip flexion 4+  Hip extension   Hip abduction 5  Hip adduction 4  Hip internal rotation 5  Hip external rotation 5  Knee flexion 5  Knee extension 5  Ankle dorsiflexion 5  Ankle plantarflexion   Ankle inversion   Ankle eversion   Great toe extension    (Blank rows = not tested)  MMT LEFT eval  Hip flexion 4+  Hip extension   Hip abduction 5  Hip adduction 4  Hip internal rotation 5  Hip external rotation  5  Knee flexion 5  Knee extension 5  Ankle dorsiflexion 5  Ankle plantarflexion   Ankle inversion   Ankle eversion   Great toe extension     (Blank rows = not tested)  LYMPHEDEMA ASSESSMENTS:   SURGERY TYPE/DATE: 1988 Hysterectomy with ovary positive 2015 surgical resection of abdominal wall re-occurrence  2019 another re-occurence NUMBER OF  LYMPH NODES REMOVED: none yet, pending LN biopsy  CHEMOTHERAPY: yes, several times  RADIATION:yes, 2 times, 2015, 2021  HORMONE TREATMENT: NO  INFECTIONS: NO  LYMPHEDEMA ASSESSMENTS:   LOWER EXTREMITY LANDMARK RIGHT eval  At groin   30 cm proximal to suprapatella   20 cm proximal to suprapatella 48.5  10 cm proximal to suprapatella 40  At midpatella / popliteal crease 33.5  30 cm proximal to floor at lateral plantar foot 30.9  20 cm proximal to floor at lateral plantar foot 23.9  10 cm proximal to floor at lateral plantar foot 21.1  Circumference of ankle/heel   5 cm proximal to 1st MTP joint   Across MTP joint 21.8  Around proximal great toe 7.0  (Blank rows = not tested)  LOWER EXTREMITY LANDMARK LEFT eval  At groin   30 cm proximal to suprapatella   20 cm proximal to suprapatella 48.4  10 cm proximal to suprapatella 40.2  At midpatella / popliteal crease 33.7  30 cm proximal to floor at lateral plantar foot 30.7  20 cm proximal to floor at lateral plantar foot 22.8  10 cm proximal to floor at lateral plantar foot 21.2  Circumference of ankle/heel   5 cm proximal to 1st MTP joint   Across MTP joint 21.8  Around proximal great toe 7.4  (Blank rows = not tested)  FUNCTIONAL TESTS:    GAIT: Distance walked: waiting room to room 8 Assistive device utilized: None Level of assistance: Complete Independence Comments: WNL  L-DEX LYMPHEDEMA SCREENING: The patient was assessed using the L-Dex machine today to produce a lymphedema index baseline score. The patient will be reassessed on a regular basis (typically every 3  months) to obtain new L-Dex scores. If the score is > 6.5 points away from his/her baseline score indicating onset of subclinical lymphedema, it will be recommended to wear a compression garment for 4 weeks, 12 hours per day and then be reassessed. If the score continues to be > 6.5 points from baseline at reassessment, we will initiate lymphedema treatment. Assessing in this manner has a 95% rate of preventing clinically significant lymphedema.  Outcome measure:LEFS  TODAY'S TREATMENT:    DATE:   12/08/2022     Discussed lymphedema signs, precautions, performed SOZO screens, educated in post biopsy exs , Gave handouts                                                                                                                                     PATIENT EDUCATION:  Education details: Access Code: [email or web address] URL: [email or web address] Date: 12/08/2022 Prepared by: Alvira Monday Lymphedema prevention:handouts given, SOZO screens Exercises - Supine Bridge  - 1 x daily - 7 x weekly - 1 sets - 10 reps - Supine March  - 1 x daily - 7 x weekly - 1 sets - 10 reps - Seated Long Arc Quad  - 1 x daily - 7  x weekly - 1 sets - 10 reps - Standing Hip Abduction with Counter Support  - 1 x daily - 7 x weekly - 1 sets - 10 reps - Standing Hip Extension with Counter Support  - 1 x daily - 7 x weekly - 1 sets - 10 reps - Standing March with Counter Support  - 1 x daily - 7 x weekly - 1 sets - 10 reps - Mini Squat with Counter Support  - 1 x daily - 7 x weekly - 1 sets - 10 reps - Heel Raises with Counter Support  - 1 x daily - 7 x weekly - 1 sets - 10 reps - Seated Toe Raise  - 1 x daily - 7 x weekly - 10 reps Person educated: Patient Education method: Explanation, Demonstration, and Handouts Education comprehension: verbalized understanding and returned demonstration  HOME EXERCISE PROGRAM: See above  ASSESSMENT:  CLINICAL IMPRESSION: Patient is a 77 y.o. female who was seen today  for physical therapy evaluation and treatment for evaluating her risk of lymphedema. She has had multiple surgical excisions including a hysterectomy with right ovary positive for CA. And several recurrent abdominal tumors post chemo and radiation. She is pending a LN biopsy to determine if she is having a recurrent malignancy and MD wants her to have screening for Lymphedema risk.Marland Kitchen She was educated in precautions for lymphedema, and a SOZO screen was performed. Circumference measure were taken, and pt was educated in some exercises to perform a week after biopsy or when allowed by MD. Pt was scheduled for a 3 month follow up SOZO screen and was advised to contact me if she has any concerns about swelling, ROM, pain etc prior to that time and we will have her come back for re-eval.  OBJECTIVE IMPAIRMENTS: decreased knowledge of condition and postural dysfunction.   ACTIVITY LIMITATIONS:  higher level activities;running, hopping  PARTICIPATION LIMITATIONS:  none presently  PERSONAL FACTORS: 3+ comorbidities: multiple surgeries, chemo, radiation all on the right side of abdomen  are also affecting patient's functional outcome.   REHAB POTENTIAL: Excellent  CLINICAL DECISION MAKING: Stable/uncomplicated  EVALUATION COMPLEXITY: Low   GOALS: Goals reviewed with patient? Yes  SHORT TERM GOALS: Target date: 02/02/2023   1.Pt will be able to verbalize understanding of pertinent lymphedema risk reduction practices relevant to her dx specifically related to skin care.  Baseline:  No knowledge Baseline: Goal status: MET 12/08/22  2.Pt will be able to return demo and/or verbalize understanding of the post op HEP related to regaining LE ROM. Baseline:  No knowledge Baseline:  Goal status: MET 12/08/2022 L  PLAN:  PT FREQUENCY: 1 visit  PT DURATION: 6 weeks prn if having swelling, pain, difficulty with right hip. Otherwise, pt is set up for SOZO screen in 3 months  PLANNED INTERVENTIONS:  Therapeutic exercises, Patient/Family education, Self Care, and Re-evaluation  PLAN FOR NEXT SESSION:  Patient will follow up at outpatient cancer rehab  following surgery only as needed.  If the patient requires physical therapy at that time, a specific plan will be dictated and sent to the referring physician for approval. She is to have SOZO screens every 3 months for 2 years, and eveyy 6 months for 2 years. The patient was educated today on appropriate basic range of motion exercises to begin post operatively .  Patient was educated today on lymphedema risk reduction practices as it pertains to recommendations that will benefit the patient immediately following surgery.  She verbalized good understanding.  Waynette Buttery, PT 12/08/2022, 1:14 PM

## 2022-12-07 NOTE — Progress Notes (Signed)
Radiation Oncology         [phone] ________________________________   Outpatient Re-Consultation - Conducted via telephone at patient request.  I spoke with the patient to conduct this consult visit via telephone. The patient was notified in advance and was offered an in person or telemedicine meeting to allow for face to face communication but instead preferred to proceed with a telephone consult.    Name: [name]        MRN: [id number]  Date of Service: 12/07/2022 DOB: 1946/01/10  WU:[id number], Danielle Ruiz, MD  Carver Fila, MD     REFERRING PHYSICIAN: Carver Fila, MD   DIAGNOSIS: The primary encounter diagnosis was Neoplasm of abdominal wall of uncertain behavior. A diagnosis of Adenocarcinoma of abdominal wall, unclear primary was also pertinent to this visit.   HISTORY OF PRESENT ILLNESS: [name] is a 77 y.o. 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 Her disease was originally diagnosed in 1988 incidentally at the time of a hysterectomy and classified this left ovarian tissue is focal well differentiated serous cystadenocarcinoma, and that this was a rare foci in a separate report apparently showed a small focus of papillary adenocarcinoma and a serous cystadenofibroma of the left ovary.  Apparently her cervix uterus left fallopian tube and right adnexa and pelvic washings were all negative.  Her appendectomy performed at that time was also negative for disease.  She had a CA-125 at that time that was in the low 30s and did not receive adjuvant therapy, in 2015 she was diagnosed with a abdominal wall recurrence in the right rectus muscle, a biopsy showed adenocarcinoma of unknown primary and she received 3 cycles of chemotherapy with Taxol and carboplatin apparently there was no response and she underwent surgical resection and then subsequently adjuvant radiotherapy because of a positive margin. 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  She received carboplatin and Gemzar therapy. In August 2021 there appeared to be recurrence at the pubic symphysis of the soft tissue mass measuring 5.2 x 3.3 x 3.4 cm and repeat biopsy showed metastatic carcinoma consistent with metastatic clear cell carcinoma on 02/06/2020 she underwent exploratory laparotomy with resection of tumor densely adherent to the anterior aspect of the pubic arch at [facility] and a 5.5 cm aggregate clear cell carcinoma was identified, and carcinoma involved multiple cauterized margins.  Her tumor was ER negative PR 1+20%. She went on to receive palliative radiotherapy which she completed in December 2021 to the tumor within and involving the pelvis.    She recently had a CA125 of 37 in June 2024, and a CT abdomen and pelvis on 11/04/22 showed an increase in the soft tissue density in the suprapubic hernia and anterior aspect of the right pubic one. A PET on 11/30/22 showed hypermetabolic activity in the anterior margin of the left inferior pubic ramus and left inguinal node. She's seen to consider additional radiotherapy to the pelvis and left groin region.    PREVIOUS RADIATION THERAPY:   03/25/20-04/29/20: The tumor in and involving the pelvis was treated to 45 Gy in 25 fractions.  01/16/2014-02/27/2014:  The patient was treated initially to a dose of 45 gray to the target region within the abdominal wall postoperative area. She received 45 gray using a 4 field 3-D conformal technique at 1.8 gray  per fraction. The patient then received a boost using a cone down 4 field technique for an additional 9 Gray. The final dose was 54 Gray.    PAST MEDICAL HISTORY:  Past Medical History:  Diagnosis Date   Anxiety    new dx   Atrial fibrillation (HCC)    Cancer (HCC) 1988, 2015    ovarian, adenocarcinoma   GERD (gastroesophageal reflux disease)    hx of years ago   Hx of radiation therapy 01/16/14-02/27/14   abdomen    Hypercholesteremia    under control with diet and fish oil   Hypertension    Ovarian cyst    PONV (postoperative nausea and vomiting)    Restless leg syndrome    Thyroid disease        PAST SURGICAL HISTORY: Past Surgical History:  Procedure Laterality Date   APPENDECTOMY     APPLICATION OF A-CELL OF CHEST/ABDOMEN N/A 11/05/2013   Procedure: ABDOMINAL WALL RESCONTRUCTION WITH STRATUS;  Surgeon: Glenna Fellows, MD;  Location: [facility];  Service: Plastics;  Laterality: N/A;   CHOLECYSTECTOMY  2009   LAPAROTOMY N/A 11/05/2013   Procedure: RESECTION OF PELVIC MASS;  Surgeon: Rejeana Brock A. Duard Brady, MD;  Location: [facility];  Service: Gynecology;  Laterality: N/A;   LAPAROTOMY  03/2018   excision of suprapubic mass at [facility]   OOPHORECTOMY     BSO   TONSILLECTOMY  as child   TOTAL ABDOMINAL HYSTERECTOMY  1988   ovarian cyst  BSO     FAMILY HISTORY:  Family History  Problem Relation Age of Onset   Heart disease Mother    Heart disease Sister    Diabetes Sister    Cancer Sister        melanoma-skin cancer   Breast cancer Paternal Grandmother        Age 78's   Cancer Paternal Grandmother        breast   Cancer Paternal Grandfather        prostate/bladder   Colon cancer Neg Hx    Ovarian cancer Neg Hx    Endometrial cancer Neg Hx    Pancreatic cancer Neg Hx    Prostate cancer Neg Hx      SOCIAL HISTORY:  reports that she has never smoked. She has never used smokeless tobacco. She reports that she does not drink alcohol and does not use drugs.  The patient is single and resides in [place]. She is retired from working as a Architect.    ALLERGIES: Patient has no known allergies.   MEDICATIONS:  Current Outpatient Medications  Medication Sig Dispense Refill   apixaban (ELIQUIS) 5 MG TABS tablet TAKE (1) TABLET BY MOUTH TWICE DAILY.  180 tablet 1   cholecalciferol (VITAMIN D3) 25 MCG (1000 UNIT) tablet Take 1,000 Units by mouth daily.     conjugated estrogens (PREMARIN) vaginal cream Finger tip amount of cream, applied to vulva and outer vagina at night 3 x a week 42.5 g 6   HYDROcodone-acetaminophen (NORCO/VICODIN) 5-325 MG tablet Take 1 tablet by mouth 3 (three) times daily as needed.     levothyroxine (SYNTHROID) 50 MCG tablet Take 50 mcg by mouth daily.     metoprolol tartrate (LOPRESSOR) 25 MG tablet TAKE (1/2) TABLET BY MOUTH TWICE DAILY. 90 tablet 3   Multiple Vitamin (MULTIVITAMIN WITH MINERALS) TABS tablet Take 1 tablet by mouth daily.     pramipexole (MIRAPEX) 0.125 MG tablet Take 0.125 mg by mouth 2 (two) times daily.  No current facility-administered medications for this encounter.     REVIEW OF SYSTEMS: On review of systems, the patient reports that she isis doing well and reports only minimal discomfort in the pelvis after learning of the PET scan findings but she was not having pain in that area prior, and denies any lower extremity edema, or difficulty with bowel or bladder activity. No other complaints are verbalized.   PHYSICAL EXAM:    Unable to assess due to encounter type  ECOG = 1  0 - Asymptomatic (Fully active, able to carry on all predisease activities without restriction)  1 - Symptomatic but completely ambulatory (Restricted in physically strenuous activity but ambulatory and able to carry out work of a light or sedentary nature. For example, light housework, office work)  2 - Symptomatic, <50% in bed during the day (Ambulatory and capable of all self care but unable to carry out any work activities. Up and about more than 50% of waking hours)  3 - Symptomatic, >50% in bed, but not bedbound (Capable of only limited self-care, confined to bed or chair 50% or more of waking hours)  4 - Bedbound (Completely disabled. Cannot carry on any self-care. Totally confined to bed or chair)  5 -  Death   Santiago Glad MM, Creech RH, Tormey DC, et al. [phone]). "Toxicity and response criteria of the [facility] Group". Am. Evlyn Clines. Oncol. 5 (6): 649-55    LABORATORY DATA:  Lab Results  Component Value Date   WBC 6.2 05/06/2020   HGB 12.3 05/06/2020   HCT 36.1 05/06/2020   MCV 95.8 05/06/2020   PLT 209 05/06/2020   Lab Results  Component Value Date   NA 142 05/25/2021   K 4.7 05/25/2021   CL 106 05/25/2021   CO2 31 05/25/2021   Lab Results  Component Value Date   ALT 31 05/25/2021   AST 30 05/25/2021   ALKPHOS 49 05/25/2021   BILITOT 0.3 05/25/2021      RADIOGRAPHY: NM PET Image Restage (PS) Skull Base to Thigh (F-18 FDG)  Result Date: 11/30/2022 CLINICAL DATA:  Subsequent treatment strategy for dental/ovarian cancer. EXAM: NUCLEAR MEDICINE PET SKULL BASE TO THIGH TECHNIQUE: 6.6 mCi F-18 FDG was injected intravenously. Full-ring PET imaging was performed from the skull base to thigh after the radiotracer. CT data was obtained and used for attenuation correction and anatomic localization. Fasting blood glucose: Ninety-four mg/dl COMPARISON:  CT abdomen pelvis 11/04/2022 and PET 03/01/2018. FINDINGS: Mediastinal blood pool activity: SUV max 2.3 Liver activity: SUV max NA NECK: No abnormal hypermetabolism. Incidental CT findings: None. CHEST: No abnormal hypermetabolism. Incidental CT findings: Atherosclerotic calcification of the aorta and coronary arteries. Heart is enlarged. No pericardial or pleural effusion. ABDOMEN/PELVIS: Hypermetabolic medial left inguinal lymph node measures 10 mm (4/188), SUV max 5.7. Hypermetabolic soft tissue mass along the anterior margin of the left inferior pubic ramus measures approximately 2.6 x 3.7 cm (4/195), SUV max 6.4. No focal hypermetabolism associated with vague soft tissue along the ventral margin of the right pubic bone, as discussed on 11/04/2022. No additional abnormal hypermetabolism. Incidental CT findings: Liver, adrenal  glands and right kidney are grossly unremarkable. Low-attenuation lesions in the left kidney. No specific follow-up necessary. Spleen, pancreas, stomach and bowel are grossly unremarkable. Cholecystectomy. SKELETON: No abnormal hypermetabolism. Incidental CT findings: Degenerative changes in the spine. IMPRESSION: 1. Hypermetabolic soft tissue mass along the anterior margin of the left inferior pubic ramus with an adjacent hypermetabolic left inguinal lymph node, consistent with  disease recurrence. 2. No evidence of distant metastatic disease. 3. Aortic atherosclerosis (ICD10-I70.0). Coronary artery calcification. Electronically Signed   By: Leanna Battles M.D.   On: 11/30/2022 10:32       IMPRESSION/PLAN: 1. Progressive metastatic carcinoma clear cell variety arising in the left adnexa involving theabdominal wall at the level of the pubic symphysis. Dr. Mitzi Hansen discusses the patient's course to date and since her last therapy. HE recommends a biopsy of the left groin node seen on PET to confirm this is truly recurrent disease. She is in agreement. Dr. Mitzi Hansen would offer additional therapy but explains the risks of reirradiation and the need to fuse her prior treatment plans into any future plans. We discussed the risks, benefits, short, and long term effects of radiotherapy, as well as the curative intent, and the patient is interested in proceeding. Dr. Mitzi Hansen discusses the delivery and logistics of radiotherapy and anticipates a course of 2-5 weeks of radiotherapy. We will coordinate simulation once she's undergone a biopsy of the groin node.  2. Risks of lymphedema of the LE. We discussed the multiple courses and potential upcoming course of radiation and that could increase the risks of lower extremity lymphedema. We will set her up for PT evaluation. A referral was placed.  This encounter was conducted via telephone.  The patient has provided two factor identification and has given verbal consent for this  type of encounter and has been advised to only accept a meeting of this type in a secure network environment. The time spent during this encounter was 45 minutes including preparation, discussion, and coordination of the patient's care. The attendants for this meeting include Rober Minion, RN, Dr. Mitzi Hansen, Ronny Bacon  and Horace Porteous.  During the encounter,  Rober Minion, RN, Dr. Mitzi Hansen, and Ronny Bacon were located at [facility] Radiation Oncology Department.  Horace Porteous was located at home.       Osker Mason, PAC

## 2022-12-07 NOTE — Progress Notes (Signed)
Nursing interview for Neoplasm of abdominal wall of uncertain behavior. Stage IV (TX, NX, M1) Adenocarcinoma of abdominal wall, unclear primary  Patient identity verified x2.  Patient reports mild ABD discomfort in the [facility] 3/10. No other issues conveyed at this time.  Meaningful use complete.  Vitals- Ht 5\' 6"  (1.676 m)   Wt 132 lb (59.9 kg)   BMI 21.31 kg/m    This concludes the interaction.  Ruel Favors, LPN

## 2022-12-08 ENCOUNTER — Other Ambulatory Visit: Payer: Self-pay

## 2022-12-08 ENCOUNTER — Ambulatory Visit: Payer: PPO | Attending: Radiation Oncology

## 2022-12-08 DIAGNOSIS — C569 Malignant neoplasm of unspecified ovary: Secondary | ICD-10-CM | POA: Insufficient documentation

## 2022-12-08 DIAGNOSIS — I890 Lymphedema, not elsewhere classified: Secondary | ICD-10-CM | POA: Diagnosis not present

## 2022-12-08 DIAGNOSIS — C44509 Unspecified malignant neoplasm of skin of other part of trunk: Secondary | ICD-10-CM | POA: Diagnosis not present

## 2022-12-08 DIAGNOSIS — C561 Malignant neoplasm of right ovary: Secondary | ICD-10-CM | POA: Insufficient documentation

## 2022-12-08 NOTE — Progress Notes (Signed)
Danielle Overlie, MD  Leodis Rains D PROCEDURE / BIOPSY REVIEW Date: 12/07/22  Requested Biopsy site: Anterior pelvis....intramuscular lesion Reason for request: Needs tissue Imaging review: PET CT 11/25/22.  Im 195/604.  CT 11/04/22, Im 80/2. Left medial muscular lesion near pubic symphysis  Decision: Approved Imaging modality to perform: CT Schedule with: Moderate Sedation Schedule for: Any VIR  Additional comments:   Please contact me with questions, concerns, or if issue pertaining to this request arise.  Arn Medal, MD [facility]

## 2022-12-13 ENCOUNTER — Other Ambulatory Visit (HOSPITAL_COMMUNITY): Payer: Self-pay | Admitting: Student

## 2022-12-13 DIAGNOSIS — C569 Malignant neoplasm of unspecified ovary: Secondary | ICD-10-CM

## 2022-12-13 NOTE — Progress Notes (Signed)
Patient for CT Abd Mass Biopsy on Thurs 12/15/2022, I called and spoke with the patient on the phone and gave pre-procedure instructions. Pt was made aware to be here at 9:30a, last dose of Eliquis was on Mon 12/12/2022, NPO after MN prior to procedure as well as driver post procedure/recovery/discharge. Pt stated understanding.  Called 12/13/2022

## 2022-12-14 ENCOUNTER — Ambulatory Visit
Admission: RE | Admit: 2022-12-14 | Discharge: 2022-12-14 | Disposition: A | Discharge status: Home / Self Care | Payer: PPO | Source: Ambulatory Visit | Attending: Radiation Oncology | Admitting: Radiation Oncology

## 2022-12-14 DIAGNOSIS — Z20828 Contact with and (suspected) exposure to other viral communicable diseases: Secondary | ICD-10-CM | POA: Diagnosis not present

## 2022-12-14 DIAGNOSIS — U071 COVID-19: Secondary | ICD-10-CM | POA: Diagnosis not present

## 2022-12-14 DIAGNOSIS — R6889 Other general symptoms and signs: Secondary | ICD-10-CM | POA: Diagnosis not present

## 2022-12-15 ENCOUNTER — Ambulatory Visit
Admission: RE | Admit: 2022-12-15 | Discharge: 2022-12-15 | Disposition: A | Discharge status: Home / Self Care | Payer: PPO | Source: Ambulatory Visit | Attending: Radiation Oncology | Admitting: Radiation Oncology

## 2022-12-20 ENCOUNTER — Ambulatory Visit: Payer: PPO

## 2022-12-23 ENCOUNTER — Telehealth: Payer: Self-pay | Admitting: *Deleted

## 2022-12-23 NOTE — Telephone Encounter (Signed)
Having biopsy on 8/14 and OV on 8/15. Asking if OV should be moved. Informed her most likely, but will confirm w/MD and call her back on Monday. OK to leave VM if she does not answer.

## 2022-12-26 NOTE — Telephone Encounter (Signed)
Informed [name] that Dr. Truett Perna wants to keep her appointment on 8/15 as scheduled.

## 2022-12-27 ENCOUNTER — Other Ambulatory Visit (HOSPITAL_COMMUNITY): Payer: Self-pay | Admitting: Student

## 2022-12-27 NOTE — H&P (Signed)
Chief Complaint: Patient was seen in consultation today for abdominal mass biopsy  Referring Physician(s): Ronny Bacon  Supervising Physician: {Supervising Physician:[id number]}  Patient Status: [facility] - Out-pt  History of Present Illness: [name] is a 77 y.o. female with a medical history significant for atrial fibrillation (Eliquis), anxiety, HTN and ovarian cancer which was first diagnosed in 62. She is familiar to IR from several prior biopsies.   She had a low anterior abdominal wall mass which was surgically resected in 2015. She also received treatment with chemotherapy and radiation therapy. She later developed another abdominal wall mass and this was surgically resected in 2019. She underwent another surgical excision of a soft tissue mass in 2021. She had been in remission for several years until recent imaging shows findings suspicious for recurrence.   PET 11/25/22 IMPRESSION: 1. Hypermetabolic soft tissue mass along the anterior margin of the left inferior pubic ramus with an adjacent hypermetabolic left inguinal lymph node, consistent with disease recurrence. 2. No evidence of distant metastatic disease. 3. Aortic atherosclerosis (ICD10-I70.0). Coronary artery calcification.  Interventional Radiology has been asked to evaluate this patient for an image-guided abdominal mass biopsy. Imaging reviewed and procedure approved by Dr. Lowella Dandy  Past Medical History:  Diagnosis Date   Anxiety    new dx   Atrial fibrillation (HCC)    Cancer (HCC) 1988, 2015   ovarian, adenocarcinoma   GERD (gastroesophageal reflux disease)    hx of years ago   Hx of radiation therapy 01/16/14-02/27/14   abdomen    Hypercholesteremia    under control with diet and fish oil   Hypertension    Ovarian cyst    PONV (postoperative nausea and vomiting)    Restless leg syndrome    Thyroid disease     Past Surgical History:  Procedure Laterality Date   APPENDECTOMY      APPLICATION OF A-CELL OF CHEST/ABDOMEN N/A 11/05/2013   Procedure: ABDOMINAL WALL RESCONTRUCTION WITH STRATUS;  Surgeon: Glenna Fellows, MD;  Location: [facility];  Service: Plastics;  Laterality: N/A;   CHOLECYSTECTOMY  2009   LAPAROTOMY N/A 11/05/2013   Procedure: RESECTION OF PELVIC MASS;  Surgeon: Rejeana Brock A. Duard Brady, MD;  Location: [facility];  Service: Gynecology;  Laterality: N/A;   LAPAROTOMY  03/2018   excision of suprapubic mass at [facility]   OOPHORECTOMY     BSO   TONSILLECTOMY  as child   TOTAL ABDOMINAL HYSTERECTOMY  1988   ovarian cyst  BSO    Allergies: Patient has no known allergies.  Medications: Prior to Admission medications   Medication Sig Start Date End Date Taking? Authorizing Provider  apixaban (ELIQUIS) 5 MG TABS tablet TAKE (1) TABLET BY MOUTH TWICE DAILY. 10/13/21   Antoine Poche, MD  cholecalciferol (VITAMIN D3) 25 MCG (1000 UNIT) tablet Take 1,000 Units by mouth daily.    [provider]  conjugated estrogens (PREMARIN) vaginal cream Finger tip amount of cream, applied to vulva and outer vagina at night 3 x a week 10/20/21   Warner Mccreedy D, NP  HYDROcodone-acetaminophen (NORCO/VICODIN) 5-325 MG tablet Take 1 tablet by mouth 3 (three) times daily as needed. 04/11/22   [provider]  levothyroxine (SYNTHROID) 50 MCG tablet Take 50 mcg by mouth daily. 06/23/20   [provider]  metoprolol tartrate (LOPRESSOR) 25 MG tablet TAKE (1/2) TABLET BY MOUTH TWICE DAILY. 06/03/22   Antoine Poche, MD  Multiple Vitamin (MULTIVITAMIN WITH MINERALS) TABS tablet Take 1 tablet by mouth daily.  [provider]  pramipexole (MIRAPEX) 0.125 MG tablet Take 0.125 mg by mouth 2 (two) times daily.    [provider]     Family History  Problem Relation Age of Onset   Heart disease Mother    Heart disease Sister    Diabetes Sister    Cancer Sister        melanoma-skin cancer   Breast cancer Paternal Grandmother        Age 56's   Cancer  Paternal Grandmother        breast   Cancer Paternal Grandfather        prostate/bladder   Colon cancer Neg Hx    Ovarian cancer Neg Hx    Endometrial cancer Neg Hx    Pancreatic cancer Neg Hx    Prostate cancer Neg Hx     Social History   Socioeconomic History   Marital status: Single    Spouse name: Not on file   Number of children: Not on file   Years of education: Not on file   Highest education level: Not on file  Occupational History   Not on file  Tobacco Use   Smoking status: Never   Smokeless tobacco: Never  Vaping Use   Vaping status: Never Used  Substance and Sexual Activity   Alcohol use: No    Alcohol/week: 0.0 standard drinks of alcohol   Drug use: No   Sexual activity: Never    Birth control/protection: Surgical    Comment: HYST  Other Topics Concern   Not on file  Social History Narrative   Single-never married   No children or pets   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   Enjoys reading, keeps house tidy, 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 at [employer] Strain: Low Risk  (02/07/2020)   Received from [facility], [facility]   Overall Financial Resource Strain (CARDIA)    Difficulty of Paying Living Expenses: Not hard at all  Food Insecurity: No Food Insecurity (12/07/2022)   Hunger Vital Sign    Worried About Running Out of Food in the Last Year: Never true    Ran Out of Food in the Last Year: Never true  Transportation Needs: No Transportation Needs (12/07/2022)   PRAPARE - Administrator, Civil Service (Medical): No    Lack of Transportation (Non-Medical): No  Physical Activity: Not on file  Stress: Not on file  Social Connections: Not on file    Review of Systems: A 12 point ROS discussed and pertinent positives are indicated in the HPI above.  All other systems are negative.  Review of Systems  Vital Signs: There were no vitals taken for this  visit.  Physical Exam  Imaging: No results found.  Labs:  CBC: No results for input(s): "WBC", "HGB", "HCT", "PLT" in the last 8760 hours.  COAGS: No results for input(s): "INR", "APTT" in the last 8760 hours.  BMP: No results for input(s): "NA", "K", "CL", "CO2", "GLUCOSE", "BUN", "CALCIUM", "CREATININE", "GFRNONAA", "GFRAA" in the last 8760 hours.  Invalid input(s): "CMP"  LIVER FUNCTION TESTS: No results for input(s): "BILITOT", "AST", "ALT", "ALKPHOS", "PROT", "ALBUMIN" in the last 8760 hours.  TUMOR MARKERS: No results for input(s): "AFPTM", "CEA", "CA199", "CHROMGRNA" in the last 8760 hours.  Assessment and Plan:  History of ovarian cancer; abdominal mass concerning for disease recurrence: Horace Porteous, 77 year old female, presents today  to the [facility] Interventional Radiology department for an image-guided abdominal mass biopsy.   Risks and benefits of this procedure were discussed with the patient and/or patient's family including, but not limited to bleeding, infection, damage to adjacent structures or low yield requiring additional tests.  All of the questions were answered and there is agreement to proceed. She has been NPO. Her last dose of Eliquis was***. She is a full code.   Consent signed and in chart.  Thank you for this interesting consult.  I greatly enjoyed meeting [name] and look forward to participating in their care.  A copy of this report was sent to the requesting provider on this date.  Electronically Signed: Alwyn Ren, AGACNP-BC [phone] 12/27/2022, 2:45 PM   I spent a total of  30 Minutes   in face to face in clinical consultation, greater than 50% of which was counseling/coordinating care for abdominal mass biopsy.

## 2022-12-27 NOTE — Progress Notes (Signed)
Patient for CT Abd Mass Biopsy on Wed 12/28/2022, I called and spoke with the patient on the phone and gave pre-procedure instructions. Pt was made aware to be here at 10a, last dose of Eliquis was Sunday 12/25/22, NPO after MN prior to procedure as well as driver post procedure/recovery/discharge. Pt stated understanding.  Called 12/27/2022

## 2022-12-28 ENCOUNTER — Other Ambulatory Visit: Payer: Self-pay | Admitting: Radiation Oncology

## 2022-12-28 ENCOUNTER — Other Ambulatory Visit: Payer: Self-pay

## 2022-12-28 ENCOUNTER — Ambulatory Visit
Admission: RE | Admit: 2022-12-28 | Discharge: 2022-12-28 | Disposition: A | Discharge status: Home / Self Care | Payer: PPO | Source: Ambulatory Visit | Attending: Radiation Oncology | Admitting: Radiation Oncology

## 2022-12-28 DIAGNOSIS — C801 Malignant (primary) neoplasm, unspecified: Secondary | ICD-10-CM

## 2022-12-28 DIAGNOSIS — C569 Malignant neoplasm of unspecified ovary: Secondary | ICD-10-CM

## 2022-12-28 DIAGNOSIS — C7989 Secondary malignant neoplasm of other specified sites: Secondary | ICD-10-CM | POA: Diagnosis not present

## 2022-12-28 DIAGNOSIS — R1900 Intra-abdominal and pelvic swelling, mass and lump, unspecified site: Secondary | ICD-10-CM | POA: Diagnosis not present

## 2022-12-28 DIAGNOSIS — I4891 Unspecified atrial fibrillation: Secondary | ICD-10-CM | POA: Diagnosis not present

## 2022-12-28 DIAGNOSIS — C44509 Unspecified malignant neoplasm of skin of other part of trunk: Secondary | ICD-10-CM | POA: Insufficient documentation

## 2022-12-28 DIAGNOSIS — M7989 Other specified soft tissue disorders: Secondary | ICD-10-CM | POA: Diagnosis not present

## 2022-12-28 DIAGNOSIS — Z7901 Long term (current) use of anticoagulants: Secondary | ICD-10-CM | POA: Insufficient documentation

## 2022-12-28 LAB — CBC
HCT: 36.7 % (ref 36.0–46.0)
Hemoglobin: 11.9 g/dL — ABNORMAL LOW (ref 12.0–15.0)
MCH: 31.7 pg (ref 26.0–34.0)
MCHC: 32.4 g/dL (ref 30.0–36.0)
MCV: 97.9 fL (ref 80.0–100.0)
Platelets: 249 10*3/uL (ref 150–400)
RBC: 3.75 MIL/uL — ABNORMAL LOW (ref 3.87–5.11)
RDW: 12.7 % (ref 11.5–15.5)
WBC: 6 10*3/uL (ref 4.0–10.5)
nRBC: 0 % (ref 0.0–0.2)

## 2022-12-28 LAB — PROTIME-INR
INR: 1.1 (ref 0.8–1.2)
Prothrombin Time: 14 seconds (ref 11.4–15.2)

## 2022-12-28 MED ORDER — MIDAZOLAM HCL 2 MG/2ML IJ SOLN
INTRAMUSCULAR | Status: AC
  Filled 2022-12-28: qty 2

## 2022-12-28 MED ORDER — FENTANYL CITRATE (PF) 100 MCG/2ML IJ SOLN
INTRAMUSCULAR | Status: AC
  Filled 2022-12-28: qty 2

## 2022-12-28 MED ORDER — SODIUM CHLORIDE 0.9 % IV SOLN: INTRAVENOUS | Status: DC

## 2022-12-28 MED ORDER — FENTANYL CITRATE (PF) 100 MCG/2ML IJ SOLN
INTRAMUSCULAR | Status: AC | PRN
Start: 2022-12-28 — End: 2022-12-28
  Administered 2022-12-28 (×2): 25 ug via INTRAVENOUS

## 2022-12-28 MED ORDER — MIDAZOLAM HCL 2 MG/2ML IJ SOLN
INTRAMUSCULAR | Status: AC | PRN
  Administered 2022-12-28 (×2): .5 mg via INTRAVENOUS

## 2022-12-28 NOTE — Procedures (Signed)
Interventional Radiology Procedure Note  Procedure: US guided biopsy of left adductor muscle mass  Complications: None  Estimated Blood Loss: None  Recommendations: - DC home   Signed,  Sterling Big, MD

## 2022-12-29 ENCOUNTER — Encounter: Payer: Self-pay | Admitting: Nurse Practitioner

## 2022-12-29 ENCOUNTER — Telehealth: Payer: Self-pay | Admitting: Radiation Oncology

## 2022-12-29 ENCOUNTER — Inpatient Hospital Stay: Payer: PPO | Attending: Gynecologic Oncology

## 2022-12-29 ENCOUNTER — Inpatient Hospital Stay: Payer: PPO | Admitting: Nurse Practitioner

## 2022-12-29 ENCOUNTER — Encounter: Payer: Self-pay | Admitting: *Deleted

## 2022-12-29 VITALS — BP 123/57 | HR 71 | Temp 98.1°F | Resp 18 | Ht 66.0 in | Wt 134.3 lb

## 2022-12-29 DIAGNOSIS — C569 Malignant neoplasm of unspecified ovary: Secondary | ICD-10-CM | POA: Diagnosis not present

## 2022-12-29 DIAGNOSIS — Z8543 Personal history of malignant neoplasm of ovary: Secondary | ICD-10-CM | POA: Insufficient documentation

## 2022-12-29 DIAGNOSIS — Z7901 Long term (current) use of anticoagulants: Secondary | ICD-10-CM | POA: Diagnosis not present

## 2022-12-29 DIAGNOSIS — C7989 Secondary malignant neoplasm of other specified sites: Secondary | ICD-10-CM | POA: Insufficient documentation

## 2022-12-29 DIAGNOSIS — Z87440 Personal history of urinary (tract) infections: Secondary | ICD-10-CM | POA: Diagnosis not present

## 2022-12-29 LAB — SURGICAL PATHOLOGY

## 2022-12-29 NOTE — Progress Notes (Signed)
Folate Receptor Alpha Mutation testing requested from accession number [id number]

## 2022-12-29 NOTE — Progress Notes (Signed)
Foundation one testing requested on accession number [id number].

## 2022-12-29 NOTE — Telephone Encounter (Signed)
The patient's pathology confirmed her GYN malignancy and I tried to call her to review these results but she was not available. I left her a message stating her results were in and would be discussed this morning as she sees Dr. Truett Perna and his team today in the next hour. I let her know that we would like to proceed with the planning of radiation and that she would be hearing from our scheduling staff.

## 2022-12-29 NOTE — Progress Notes (Signed)
[facility] OFFICE PROGRESS NOTE   Diagnosis: Ovarian cancer  INTERVAL HISTORY:   [name] returns as scheduled.  She underwent biopsy of the soft tissue mass yesterday.  She overall feels well.  No nausea or vomiting.  No constipation or diarrhea.  She has mild intermittent discomfort at the right lower abdomen.  Objective:  Vital signs in last 24 hours:  Blood pressure (!) 123/57, pulse 71, temperature 98.1 F (36.7 C), temperature source Oral, resp. rate 18, height 5\' 6"  (1.676 m), weight 134 lb 4.8 oz (60.9 kg), SpO2 100%.    HEENT: No thrush or ulcers. Resp: Lungs clear bilaterally. Cardio: Regular rate and rhythm. GI: No hepatosplenomegaly.  No mass.  Bandage left pelvic/inguinal region covering biopsy site. Vascular: No leg edema.   Lab Results:  Lab Results  Component Value Date   WBC 6.0 12/28/2022   HGB 11.9 (L) 12/28/2022   HCT 36.7 12/28/2022   MCV 97.9 12/28/2022   PLT 249 12/28/2022   NEUTROABS 4.0 05/06/2020    Imaging:  CT US GUIDED BIOPSY  Result Date: 12/28/2022 INDICATION: 77 year old female with a history of ovarian cancer with abdominal wall metastases. She has a new nodule within the left adductor musculature in the inguinal region and presents for ultrasound-guided core biopsy. EXAM: ULTRASOUND CORE BIOPSY MEDICATIONS: None. ANESTHESIA/SEDATION: Moderate (conscious) sedation was employed during this procedure. A total of Versed 1 mg and Fentanyl 50 mcg was administered intravenously. Moderate Sedation Time: 17 minutes. The patient's level of consciousness and vital signs were monitored continuously by radiology nursing throughout the procedure under my direct supervision. FLUOROSCOPY TIME:  None. COMPLICATIONS: None immediate. PROCEDURE: Informed written consent was obtained from the patient after a thorough discussion of the procedural risks, benefits and alternatives. All questions were addressed. Maximal Sterile Barrier Technique  was utilized including caps, mask, sterile gowns, sterile gloves, sterile drape, hand hygiene and skin antiseptic. A timeout was performed prior to the initiation of the procedure. Sonographic interrogation of the left medial upper thigh in the inguinal region demonstrates an irregular hypoechoic soft tissue mass within the left pectineus muscle. A suitable skin entry site was selected and marked. The overlying skin was sterilely prepped and draped in the standard fashion using chlorhexidine skin prep. Local anesthesia was attained by infiltration with 1% lidocaine. Under real-time ultrasound guidance, multiple 18 gauge core biopsies were obtained utilizing the bio Pince automated biopsy device. Biopsy specimens were then placed in formalin and delivered to pathology for further analysis. IMPRESSION: Successful ultrasound-guided core biopsy of left soft tissue mass within the left pectineus muscle. Electronically Signed   By: Malachy Moan M.D.   On: 12/28/2022 13:01    Medications: I have reviewed the patient's current medications.  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   Elevated CA 125.   Cycle 1 Taxol/carboplatin 08/16/2013.   Cycle 2 Taxol/carboplatin 09/06/2013.   Cycle 3 Taxol/carboplatin 09/27/2013   CT 10/11/2013 with a stable abdominal wall mass. Status post resection of abdominal wall mass with abdominal wall reconstruction 11/05/2013. Final pathology showed adenocarcinoma. Specimen extensively involved with adenocarcinoma which focally involved the edge of the specimen. Immunostains and Foundation 1 testing were nonspecific for a primary tumor location. Adjuvant radiation to the abdominal wall 01/16/2014 through 02/27/2014 CT 01/15/2018 while in the emergency room for evaluation of back pain- midline abdominal wall mass above the  pubic symphysis and adjacent to the bladder Ultrasound-guided biopsy of the abdominal wall mass 01/25/2018-adenocarcinoma similar to the 2015 biopsy PET scan 03/01/2018-hypermetabolism associated with a cystic and solid suprapubic mass.  Focal uptake identified in the left L3-4 facets without underlying bony lesion.  No other sites of unexpected or suspicious hypermetabolism in the neck, chest, abdomen or pelvis. Exploratory laparotomy, resection of abdominal wall tumor, oversew of bladder peritoneum, cystoscopy 03/28/2018 (findings included a cystic and solid mass encountered below the lower edge of the incision extending into the space of retzius and adherent to the pubic arch inferiorly, bladder dome posteriorly and prior abdominal wall repair anteriorly.  No nodularity palpated in the tumor bed.  Visualized bowel and omentum free of disease.  Pelvic peritoneum appeared normal).  A "small "amount of tumor on the periosteum was cauterized using a Bovie. Pathology on pelvic tumor-metastatic high-grade adenocarcinoma, favor clear-cell carcinoma of gynecologic origin, fragments up to 5.8 cm in size; ER negative, PR positive, 1+ (weak staining) to focally 2+, 30-40%, MSI-stable, mutation burden-4,CREBBP alteration Cycle 1 gemcitabine/carboplatin 05/21/2018; day 8 held due to neutropenia Cycle 2 gemcitabine/carboplatin 06/11/2018 (day 8 gemcitabine eliminated) Cycle 3 gemcitabine/carboplatin 07/06/2018 Cycle 4 gemcitabine/carboplatin 07/27/2018 (Udenyca added) Cycle 5 gemcitabine/carboplatin 08/17/2018 Cycle 6 gemcitabine/carboplatin 09/07/2018  CT 10/19/2018- low anterior abdominal wall mass no longer identified, soft tissue thickening cephalad to the site of prior mass-nonspecific, no additional evidence of metastatic disease CT 04/18/2019-no evidence of recurrent or metastatic disease CT 12/23/2019-slight increase in size of ventral midline soft tissue mass near the pubis, no other evidence of disease  progression Biopsy pelvic body wall soft tissue mass 01/06/2020-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, ER negative, HER2 positive by [facility] Surgical excision of the soft tissue mass adherent to the pubis 02/06/2020-clear cell carcinoma, positive surgical margins Adjuvant radiation 03/25/2020- 04/29/2020 CT abdomen/pelvis 05/19/2020- resolution of suprapubic abdominal wall mass, no evidence of metastatic disease, moderate right and small left hernias CT abdomen/pelvis 11/26/2020-no recurrence of the lower pelvic soft tissue mass, small bilateral lower pelvic hernias, no evidence of recurrent or metastatic disease CT abdomen/pelvis 05/27/2021-no evidence of recurrent disease CT abdomen/pelvis 02/28/2022-no evidence of recurrent disease progressive bowel herniation in the anterior pelvic wall CT abdomen/pelvis 08/24/2022-no evidence of recurrent disease CT abdomen/pelvis 11/04/2022-mild increase in size of a soft tissue density at the inferior aspect of the suprapubic hernia and anterior margin of the right pubic bone PET 11/30/2022-hypermetabolic soft tissue mass at the anterior margin of the left inferior pubic ramus, hypermetabolic adjacent left inguinal node, no evidence of distant metastatic disease CT biopsy left soft tissue mass left pectineus muscle 12/28/2022-metastatic adenocarcinoma morphologically consistent with patient's known history of clear-cell carcinoma of gynecologic origin Ovarian cancer in 1988, low-grade adenocarcinoma with areas of "borderline" carcinoma, status post a hysterectomy and bilateral oophorectomy. New onset atrial fibrillation August 2016, maintained on eliquis Recurrent urinary tract infections Restless legs    Disposition: Ms. Brener appears stable.  The biopsy result from yesterday shows metastatic adenocarcinoma consistent with prior clear-cell carcinoma of GYN origin.  This was reviewed with her and her sister at  today's visit.  Plan is to proceed with radiation.  She is in agreement.  We are requesting additional testing on the biopsy material.  She will return for a CA125 and follow-up visit in 6 to 8 weeks.  We are available to see her sooner if needed.  Patient seen with Dr. Truett Perna.    Lonna Cobb ANP/GNP-BC   12/29/2022  9:56 AM This  was a shared visit with Lonna Cobb.  Ms. Vorhies underwent biopsy of the left pelvic mass yesterday.  The biopsy confirms progression of the gynecologic malignancy.  I discussed the case with Dr. Pricilla Holm.  Surgery is not recommended.  I agree with the plan for palliative radiation.  We can consider systemic therapy in the future, but this could be associated with significant morbidity.  We will repeat NGS testing and folate receptor alpha mutation testing.  I agree with the plan for palliative radiation.   Mancel Bale, MD

## 2022-12-30 ENCOUNTER — Encounter: Payer: Self-pay | Admitting: *Deleted

## 2022-12-30 NOTE — Progress Notes (Signed)
Fax from [employer] that order for Foundation One testing received and they are asking for tissue.

## 2022-12-31 LAB — CA 125: Cancer Antigen (CA) 125: 32.4 U/mL (ref 0.0–38.1)

## 2023-01-04 DIAGNOSIS — C482 Malignant neoplasm of peritoneum, unspecified: Secondary | ICD-10-CM | POA: Diagnosis not present

## 2023-01-04 DIAGNOSIS — G894 Chronic pain syndrome: Secondary | ICD-10-CM | POA: Diagnosis not present

## 2023-01-04 DIAGNOSIS — Z6820 Body mass index (BMI) 20.0-20.9, adult: Secondary | ICD-10-CM | POA: Diagnosis not present

## 2023-01-04 DIAGNOSIS — F419 Anxiety disorder, unspecified: Secondary | ICD-10-CM | POA: Diagnosis not present

## 2023-01-05 ENCOUNTER — Ambulatory Visit
Admission: RE | Admit: 2023-01-05 | Discharge: 2023-01-05 | Disposition: A | Discharge status: Home / Self Care | Payer: PPO | Source: Ambulatory Visit | Attending: Radiation Oncology | Admitting: Radiation Oncology

## 2023-01-05 ENCOUNTER — Other Ambulatory Visit: Payer: Self-pay

## 2023-01-05 DIAGNOSIS — C494 Malignant neoplasm of connective and soft tissue of abdomen: Secondary | ICD-10-CM | POA: Insufficient documentation

## 2023-01-05 DIAGNOSIS — Z510 Encounter for antineoplastic radiation therapy: Secondary | ICD-10-CM | POA: Insufficient documentation

## 2023-01-11 ENCOUNTER — Encounter: Payer: Self-pay | Admitting: Interventional Radiology

## 2023-01-11 ENCOUNTER — Telehealth: Payer: Self-pay | Admitting: *Deleted

## 2023-01-11 NOTE — Telephone Encounter (Signed)
[name] asking if she needs to give blood for the Foundation One testing and what cost is. Informed her the testing is being done from her past biopsy tissue and not blood. She is asking about cost. Informed her they will appeal to insurance company x 2 before they are billed and they have a very generous patient assistance program. She has received the financial assistance form from them and plans to complete it and send back in.

## 2023-01-11 NOTE — Telephone Encounter (Signed)
LVM requesting to talk about a letter she received from [facility]. This RN called back and LVM that return call attempted.

## 2023-01-12 ENCOUNTER — Other Ambulatory Visit: Payer: Self-pay | Admitting: Radiation Oncology

## 2023-01-12 DIAGNOSIS — C569 Malignant neoplasm of unspecified ovary: Secondary | ICD-10-CM

## 2023-01-12 DIAGNOSIS — C44509 Unspecified malignant neoplasm of skin of other part of trunk: Secondary | ICD-10-CM

## 2023-01-19 ENCOUNTER — Ambulatory Visit
Admission: RE | Admit: 2023-01-19 | Discharge: 2023-01-19 | Disposition: A | Discharge status: Home / Self Care | Payer: PPO | Source: Ambulatory Visit | Attending: Radiation Oncology | Admitting: Radiation Oncology

## 2023-01-19 ENCOUNTER — Other Ambulatory Visit: Payer: Self-pay

## 2023-01-19 DIAGNOSIS — Z923 Personal history of irradiation: Secondary | ICD-10-CM | POA: Insufficient documentation

## 2023-01-19 DIAGNOSIS — C494 Malignant neoplasm of connective and soft tissue of abdomen: Secondary | ICD-10-CM | POA: Diagnosis not present

## 2023-01-19 DIAGNOSIS — Z510 Encounter for antineoplastic radiation therapy: Secondary | ICD-10-CM | POA: Insufficient documentation

## 2023-01-19 LAB — RAD ONC ARIA SESSION SUMMARY
Course Elapsed Days: 0
Plan Fractions Treated to Date: 1
Plan Prescribed Dose Per Fraction: 5 Gy
Plan Total Fractions Prescribed: 10
Plan Total Prescribed Dose: 50 Gy
Reference Point Dosage Given to Date: 5 Gy
Reference Point Session Dosage Given: 5 Gy
Session Number: 1

## 2023-01-20 ENCOUNTER — Ambulatory Visit
Admission: RE | Admit: 2023-01-20 | Discharge: 2023-01-20 | Disposition: A | Discharge status: Home / Self Care | Payer: PPO | Source: Ambulatory Visit | Attending: Radiation Oncology | Admitting: Radiation Oncology

## 2023-01-20 ENCOUNTER — Other Ambulatory Visit (HOSPITAL_COMMUNITY): Payer: PPO

## 2023-01-20 ENCOUNTER — Other Ambulatory Visit: Payer: Self-pay

## 2023-01-20 DIAGNOSIS — C494 Malignant neoplasm of connective and soft tissue of abdomen: Secondary | ICD-10-CM | POA: Diagnosis not present

## 2023-01-20 DIAGNOSIS — Z510 Encounter for antineoplastic radiation therapy: Secondary | ICD-10-CM | POA: Diagnosis not present

## 2023-01-20 LAB — RAD ONC ARIA SESSION SUMMARY
Course Elapsed Days: 1
Plan Fractions Treated to Date: 2
Plan Prescribed Dose Per Fraction: 5 Gy
Plan Total Fractions Prescribed: 10
Plan Total Prescribed Dose: 50 Gy
Reference Point Dosage Given to Date: 10 Gy
Reference Point Session Dosage Given: 5 Gy
Session Number: 2

## 2023-01-23 ENCOUNTER — Other Ambulatory Visit: Payer: Self-pay

## 2023-01-23 ENCOUNTER — Ambulatory Visit
Admission: RE | Admit: 2023-01-23 | Discharge: 2023-01-23 | Disposition: A | Discharge status: Home / Self Care | Payer: PPO | Source: Ambulatory Visit | Attending: Radiation Oncology | Admitting: Radiation Oncology

## 2023-01-23 ENCOUNTER — Encounter: Payer: Self-pay | Admitting: *Deleted

## 2023-01-23 DIAGNOSIS — Z510 Encounter for antineoplastic radiation therapy: Secondary | ICD-10-CM | POA: Diagnosis not present

## 2023-01-23 DIAGNOSIS — C494 Malignant neoplasm of connective and soft tissue of abdomen: Secondary | ICD-10-CM | POA: Diagnosis not present

## 2023-01-23 LAB — RAD ONC ARIA SESSION SUMMARY
Course Elapsed Days: 4
Plan Fractions Treated to Date: 3
Plan Prescribed Dose Per Fraction: 5 Gy
Plan Total Fractions Prescribed: 10
Plan Total Prescribed Dose: 50 Gy
Reference Point Dosage Given to Date: 15 Gy
Reference Point Session Dosage Given: 5 Gy
Session Number: 3

## 2023-01-23 NOTE — Progress Notes (Signed)
Fax from [facility] that specimen has been received and in process. [phone]

## 2023-01-24 ENCOUNTER — Other Ambulatory Visit: Payer: Self-pay

## 2023-01-24 ENCOUNTER — Ambulatory Visit
Admission: RE | Admit: 2023-01-24 | Discharge: 2023-01-24 | Disposition: A | Discharge status: Home / Self Care | Payer: PPO | Source: Ambulatory Visit | Attending: Radiation Oncology

## 2023-01-24 DIAGNOSIS — C494 Malignant neoplasm of connective and soft tissue of abdomen: Secondary | ICD-10-CM | POA: Diagnosis not present

## 2023-01-24 DIAGNOSIS — Z510 Encounter for antineoplastic radiation therapy: Secondary | ICD-10-CM | POA: Diagnosis not present

## 2023-01-24 LAB — RAD ONC ARIA SESSION SUMMARY
Course Elapsed Days: 5
Plan Fractions Treated to Date: 4
Plan Prescribed Dose Per Fraction: 5 Gy
Plan Total Fractions Prescribed: 10
Plan Total Prescribed Dose: 50 Gy
Reference Point Dosage Given to Date: 20 Gy
Reference Point Session Dosage Given: 5 Gy
Session Number: 4

## 2023-01-25 ENCOUNTER — Ambulatory Visit
Admission: RE | Admit: 2023-01-25 | Discharge: 2023-01-25 | Disposition: A | Discharge status: Home / Self Care | Payer: PPO | Source: Ambulatory Visit | Attending: Radiation Oncology

## 2023-01-25 ENCOUNTER — Other Ambulatory Visit: Payer: Self-pay

## 2023-01-25 DIAGNOSIS — Z510 Encounter for antineoplastic radiation therapy: Secondary | ICD-10-CM | POA: Diagnosis not present

## 2023-01-25 DIAGNOSIS — C494 Malignant neoplasm of connective and soft tissue of abdomen: Secondary | ICD-10-CM | POA: Diagnosis not present

## 2023-01-25 LAB — RAD ONC ARIA SESSION SUMMARY
Course Elapsed Days: 6
Plan Fractions Treated to Date: 5
Plan Prescribed Dose Per Fraction: 5 Gy
Plan Total Fractions Prescribed: 10
Plan Total Prescribed Dose: 50 Gy
Reference Point Dosage Given to Date: 25 Gy
Reference Point Session Dosage Given: 5 Gy
Session Number: 5

## 2023-01-26 ENCOUNTER — Ambulatory Visit
Admission: RE | Admit: 2023-01-26 | Discharge: 2023-01-26 | Disposition: A | Discharge status: Home / Self Care | Payer: PPO | Source: Ambulatory Visit | Attending: Radiation Oncology | Admitting: Radiation Oncology

## 2023-01-26 ENCOUNTER — Ambulatory Visit: Payer: PPO | Admitting: Gynecologic Oncology

## 2023-01-26 ENCOUNTER — Other Ambulatory Visit: Payer: Self-pay

## 2023-01-26 ENCOUNTER — Other Ambulatory Visit: Payer: PPO

## 2023-01-26 DIAGNOSIS — Z510 Encounter for antineoplastic radiation therapy: Secondary | ICD-10-CM | POA: Diagnosis not present

## 2023-01-26 DIAGNOSIS — C494 Malignant neoplasm of connective and soft tissue of abdomen: Secondary | ICD-10-CM | POA: Diagnosis not present

## 2023-01-26 LAB — RAD ONC ARIA SESSION SUMMARY
Course Elapsed Days: 7
Plan Fractions Treated to Date: 6
Plan Prescribed Dose Per Fraction: 5 Gy
Plan Total Fractions Prescribed: 10
Plan Total Prescribed Dose: 50 Gy
Reference Point Dosage Given to Date: 30 Gy
Reference Point Session Dosage Given: 5 Gy
Session Number: 6

## 2023-01-27 ENCOUNTER — Ambulatory Visit
Admission: RE | Admit: 2023-01-27 | Discharge: 2023-01-27 | Disposition: A | Discharge status: Home / Self Care | Payer: PPO | Source: Ambulatory Visit | Attending: Radiation Oncology

## 2023-01-27 ENCOUNTER — Other Ambulatory Visit: Payer: Self-pay

## 2023-01-27 DIAGNOSIS — Z510 Encounter for antineoplastic radiation therapy: Secondary | ICD-10-CM | POA: Diagnosis not present

## 2023-01-27 DIAGNOSIS — C494 Malignant neoplasm of connective and soft tissue of abdomen: Secondary | ICD-10-CM | POA: Diagnosis not present

## 2023-01-27 LAB — RAD ONC ARIA SESSION SUMMARY
Course Elapsed Days: 8
Plan Fractions Treated to Date: 7
Plan Prescribed Dose Per Fraction: 5 Gy
Plan Total Fractions Prescribed: 10
Plan Total Prescribed Dose: 50 Gy
Reference Point Dosage Given to Date: 35 Gy
Reference Point Session Dosage Given: 5 Gy
Session Number: 7

## 2023-01-30 ENCOUNTER — Ambulatory Visit
Admission: RE | Admit: 2023-01-30 | Discharge: 2023-01-30 | Disposition: A | Discharge status: Home / Self Care | Payer: PPO | Source: Ambulatory Visit | Attending: Radiation Oncology | Admitting: Radiation Oncology

## 2023-01-30 ENCOUNTER — Other Ambulatory Visit: Payer: Self-pay

## 2023-01-30 DIAGNOSIS — C494 Malignant neoplasm of connective and soft tissue of abdomen: Secondary | ICD-10-CM | POA: Diagnosis not present

## 2023-01-30 DIAGNOSIS — Z510 Encounter for antineoplastic radiation therapy: Secondary | ICD-10-CM | POA: Diagnosis not present

## 2023-01-30 LAB — RAD ONC ARIA SESSION SUMMARY
Course Elapsed Days: 11
Plan Fractions Treated to Date: 8
Plan Prescribed Dose Per Fraction: 5 Gy
Plan Total Fractions Prescribed: 10
Plan Total Prescribed Dose: 50 Gy
Reference Point Dosage Given to Date: 40 Gy
Reference Point Session Dosage Given: 5 Gy
Session Number: 8

## 2023-01-31 ENCOUNTER — Encounter: Payer: Self-pay | Admitting: Oncology

## 2023-01-31 ENCOUNTER — Other Ambulatory Visit: Payer: Self-pay

## 2023-01-31 ENCOUNTER — Ambulatory Visit
Admission: RE | Admit: 2023-01-31 | Discharge: 2023-01-31 | Disposition: A | Discharge status: Home / Self Care | Payer: PPO | Source: Ambulatory Visit | Attending: Radiation Oncology

## 2023-01-31 DIAGNOSIS — Z510 Encounter for antineoplastic radiation therapy: Secondary | ICD-10-CM | POA: Diagnosis not present

## 2023-01-31 DIAGNOSIS — C801 Malignant (primary) neoplasm, unspecified: Secondary | ICD-10-CM | POA: Diagnosis not present

## 2023-01-31 DIAGNOSIS — C494 Malignant neoplasm of connective and soft tissue of abdomen: Secondary | ICD-10-CM | POA: Diagnosis not present

## 2023-01-31 LAB — RAD ONC ARIA SESSION SUMMARY
Course Elapsed Days: 12
Plan Fractions Treated to Date: 9
Plan Prescribed Dose Per Fraction: 5 Gy
Plan Total Fractions Prescribed: 10
Plan Total Prescribed Dose: 50 Gy
Reference Point Dosage Given to Date: 45 Gy
Reference Point Session Dosage Given: 5 Gy
Session Number: 9

## 2023-02-01 ENCOUNTER — Other Ambulatory Visit: Payer: Self-pay

## 2023-02-01 ENCOUNTER — Ambulatory Visit
Admission: RE | Admit: 2023-02-01 | Discharge: 2023-02-01 | Disposition: A | Discharge status: Home / Self Care | Payer: PPO | Source: Ambulatory Visit | Attending: Radiation Oncology

## 2023-02-01 DIAGNOSIS — C494 Malignant neoplasm of connective and soft tissue of abdomen: Secondary | ICD-10-CM | POA: Diagnosis not present

## 2023-02-01 DIAGNOSIS — Z510 Encounter for antineoplastic radiation therapy: Secondary | ICD-10-CM | POA: Diagnosis not present

## 2023-02-01 LAB — RAD ONC ARIA SESSION SUMMARY
Course Elapsed Days: 13
Plan Fractions Treated to Date: 10
Plan Prescribed Dose Per Fraction: 5 Gy
Plan Total Fractions Prescribed: 10
Plan Total Prescribed Dose: 50 Gy
Reference Point Dosage Given to Date: 50 Gy
Reference Point Session Dosage Given: 5 Gy
Session Number: 10

## 2023-02-03 NOTE — Radiation Completion Notes (Signed)
Radiation Oncology         [phone] ________________________________  Name: SECILY LALANDE MRN: [id number]  Date of Service: 02/01/2023  DOB: [birth year]  End of Treatment Note  Diagnosis: Progressive metastatic carcinoma clear cell variety arising in the left adnexa involving theabdominal wall at the level of the pubic symphysis   Intent: Palliative     ==========DELIVERED PLANS==========  First Treatment Date: 2023-01-19 - Last Treatment Date: 2023-02-01 Re-Irradiation Plan Name: Pelvis_L_UHRT Site: Groin, Left Technique: IMRT Mode: Photon Dose Per Fraction: 5 Gy Prescribed Dose (Delivered / Prescribed): 50 Gy / 50 Gy Prescribed Fxs (Delivered / Prescribed): 10 / 10     ==========ON TREATMENT VISIT DATES========== 2023-01-20, 2023-01-27   See weekly On Treatment Notes in Epic for details. The patient tolerated radiation without symptoms of skin irritation or fatigue.  The patient will receive a call in about one month from the radiation oncology department. She will continue follow up with Dr. Truett Perna as well.      Osker Mason, PAC

## 2023-02-09 ENCOUNTER — Inpatient Hospital Stay: Payer: PPO | Attending: Gynecologic Oncology

## 2023-02-09 ENCOUNTER — Telehealth: Payer: Self-pay | Admitting: Oncology

## 2023-02-09 ENCOUNTER — Inpatient Hospital Stay: Payer: PPO | Admitting: Oncology

## 2023-02-09 VITALS — BP 121/58 | HR 99 | Temp 98.1°F | Resp 18 | Ht 66.0 in | Wt 132.4 lb

## 2023-02-09 DIAGNOSIS — Z9221 Personal history of antineoplastic chemotherapy: Secondary | ICD-10-CM | POA: Diagnosis not present

## 2023-02-09 DIAGNOSIS — G2581 Restless legs syndrome: Secondary | ICD-10-CM | POA: Insufficient documentation

## 2023-02-09 DIAGNOSIS — Z923 Personal history of irradiation: Secondary | ICD-10-CM | POA: Diagnosis not present

## 2023-02-09 DIAGNOSIS — C649 Malignant neoplasm of unspecified kidney, except renal pelvis: Secondary | ICD-10-CM | POA: Diagnosis not present

## 2023-02-09 DIAGNOSIS — Z8543 Personal history of malignant neoplasm of ovary: Secondary | ICD-10-CM | POA: Diagnosis not present

## 2023-02-09 DIAGNOSIS — C569 Malignant neoplasm of unspecified ovary: Secondary | ICD-10-CM

## 2023-02-09 DIAGNOSIS — Z87440 Personal history of urinary (tract) infections: Secondary | ICD-10-CM | POA: Diagnosis not present

## 2023-02-09 DIAGNOSIS — C7989 Secondary malignant neoplasm of other specified sites: Secondary | ICD-10-CM | POA: Diagnosis not present

## 2023-02-09 NOTE — Telephone Encounter (Signed)
Left a message advising CT has been rescheduled to 03/27/23 with arrival at 2:45 to [facility] and follow up with Dr. Pricilla Holm has been rescheduled to 03/31/23 at 8:30 with labs at 8:00.  Requested a return call to confirm.

## 2023-02-09 NOTE — Progress Notes (Signed)
[facility] OFFICE PROGRESS NOTE   Diagnosis: Metastatic clear-cell carcinoma  INTERVAL HISTORY:   [name] returns as scheduled.  She completed radiation to the pubic mass on 02/01/2023.  She reports tolerating radiation well.  No complaint today.  Objective:  Vital signs in last 24 hours:  Blood pressure (!) 121/58, pulse 99, temperature 98.1 F (36.7 C), temperature source Temporal, resp. rate 18, height 5\' 6"  (1.676 m), weight 132 lb 6.4 oz (60.1 kg), SpO2 100%.   Resp: Lungs clear bilaterally Cardio: Irregular GI: No mass, nontender, no hepatosplenomegaly Vascular: No leg edema  Lab Results:  Lab Results  Component Value Date   WBC 6.0 12/28/2022   HGB 11.9 (L) 12/28/2022   HCT 36.7 12/28/2022   MCV 97.9 12/28/2022   PLT 249 12/28/2022   NEUTROABS 4.0 05/06/2020    CMP  Lab Results  Component Value Date   NA 142 05/25/2021   K 4.7 05/25/2021   CL 106 05/25/2021   CO2 31 05/25/2021   GLUCOSE 110 (H) 05/25/2021   BUN 21 05/25/2021   CREATININE 0.89 05/25/2021   CALCIUM 10.1 05/25/2021   PROT 6.6 05/25/2021   ALBUMIN 4.0 05/25/2021   AST 30 05/25/2021   ALT 31 05/25/2021   ALKPHOS 49 05/25/2021   BILITOT 0.3 05/25/2021   GFRNONAA >60 05/25/2021   GFRAA >60 12/25/2019    Lab Results  Component Value Date   CA125 10 03/17/2016     Medications: I have reviewed the patient's current medications.   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   Elevated CA 125.   Cycle 1 Taxol/carboplatin 08/16/2013.   Cycle 2 Taxol/carboplatin 09/06/2013.   Cycle 3 Taxol/carboplatin 09/27/2013   CT 10/11/2013 with a stable abdominal wall mass. Status post resection of abdominal wall mass with abdominal wall reconstruction 11/05/2013. Final pathology showed adenocarcinoma. Specimen extensively  involved with adenocarcinoma which focally involved the edge of the specimen. Immunostains and Foundation 1 testing were nonspecific for a primary tumor location. Adjuvant radiation to the abdominal wall 01/16/2014 through 02/27/2014 CT 01/15/2018 while in the emergency room for evaluation of back pain- midline abdominal wall mass above the pubic symphysis and adjacent to the bladder Ultrasound-guided biopsy of the abdominal wall mass 01/25/2018-adenocarcinoma similar to the 2015 biopsy PET scan 03/01/2018-hypermetabolism associated with a cystic and solid suprapubic mass.  Focal uptake identified in the left L3-4 facets without underlying bony lesion.  No other sites of unexpected or suspicious hypermetabolism in the neck, chest, abdomen or pelvis. Exploratory laparotomy, resection of abdominal wall tumor, oversew of bladder peritoneum, cystoscopy 03/28/2018 (findings included a cystic and solid mass encountered below the lower edge of the incision extending into the space of retzius and adherent to the pubic arch inferiorly, bladder dome posteriorly and prior abdominal wall repair anteriorly.  No nodularity palpated in the tumor bed.  Visualized bowel and omentum free of disease.  Pelvic peritoneum appeared normal).  A "small "amount of tumor on the periosteum was cauterized using a Bovie. Pathology on pelvic tumor-metastatic high-grade adenocarcinoma, favor clear-cell carcinoma of gynecologic origin, fragments up to 5.8 cm in size; ER negative, PR positive, 1+ (weak staining) to focally 2+, 30-40%, MSI-stable, mutation burden-4,CREBBP alteration Cycle 1 gemcitabine/carboplatin 05/21/2018; day 8 held due to neutropenia Cycle 2 gemcitabine/carboplatin 06/11/2018 (day 8 gemcitabine eliminated) Cycle 3 gemcitabine/carboplatin 07/06/2018 Cycle 4 gemcitabine/carboplatin 07/27/2018 [name]  added) Cycle 5 gemcitabine/carboplatin 08/17/2018 Cycle 6 gemcitabine/carboplatin 09/07/2018  CT 10/19/2018- low anterior abdominal  wall mass no longer identified, soft tissue thickening cephalad to the site of prior mass-nonspecific, no additional evidence of metastatic disease CT 04/18/2019-no evidence of recurrent or metastatic disease CT 12/23/2019-slight increase in size of ventral midline soft tissue mass near the pubis, no other evidence of disease progression Biopsy pelvic body wall soft tissue mass 01/06/2020-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, ER negative, HER2 positive by [facility] Surgical excision of the soft tissue mass adherent to the pubis 02/06/2020-clear cell carcinoma, positive surgical margins Adjuvant radiation 03/25/2020- 04/29/2020 CT abdomen/pelvis 05/19/2020- resolution of suprapubic abdominal wall mass, no evidence of metastatic disease, moderate right and small left hernias CT abdomen/pelvis 11/26/2020-no recurrence of the lower pelvic soft tissue mass, small bilateral lower pelvic hernias, no evidence of recurrent or metastatic disease CT abdomen/pelvis 05/27/2021-no evidence of recurrent disease CT abdomen/pelvis 02/28/2022-no evidence of recurrent disease progressive bowel herniation in the anterior pelvic wall CT abdomen/pelvis 08/24/2022-no evidence of recurrent disease CT abdomen/pelvis 11/04/2022-mild increase in size of a soft tissue density at the inferior aspect of the suprapubic hernia and anterior margin of the right pubic bone PET 11/30/2022-hypermetabolic soft tissue mass at the anterior margin of the left inferior pubic ramus, hypermetabolic adjacent left inguinal node, no evidence of distant metastatic disease CT biopsy left soft tissue mass left pectineus muscle 12/28/2022-metastatic adenocarcinoma morphologically consistent with patient's known history of clear-cell carcinoma of gynecologic origin, HRD negative, MSS, tumor mutation burden 7, CREBBP alteration Radiation to the left pubis mass 01/19/2023 - 02/01/2023, 50 Gray in 10  fractions  Ovarian cancer in 1988, low-grade adenocarcinoma with areas of "borderline" carcinoma, status post a hysterectomy and bilateral oophorectomy. New onset atrial fibrillation August 2016, maintained on eliquis Recurrent urinary tract infections Restless legs      Disposition: [name] completed a course of palliative radiation to the left pelvic mass on 02/01/2023.  She tolerated the radiation well.  She has no complaint today.  She is scheduled for a restaging CT and follow-up with Dr. Pricilla Holm in a few weeks.  I will contact Dr. Pricilla Holm to see if she agrees with moving this out 1 month.  The Foundation 1 testing does not reveal a systemic therapy target.  I will request a repeat breast prognostic profile on the 12/28/2022 biopsy.  The 2021 biopsy was HER2 positive by FISH.  [name] will return for an office visit after the restaging CT.  Thornton Papas, MD  02/09/2023  11:32 AM

## 2023-02-10 ENCOUNTER — Telehealth: Payer: Self-pay | Admitting: Oncology

## 2023-02-10 LAB — CA 125: Cancer Antigen (CA) 125: 58 U/mL — ABNORMAL HIGH (ref 0.0–38.1)

## 2023-02-10 NOTE — Telephone Encounter (Signed)
[name] called back and confirmed her appointments for the CT scan on 03/27/23 and to see Dr. Pricilla Holm on 03/31/23.

## 2023-02-13 ENCOUNTER — Telehealth: Payer: Self-pay | Admitting: *Deleted

## 2023-02-13 NOTE — Telephone Encounter (Signed)
Notified patient of CA125 results that are slightly higher. Could be cancer related or could be due to inflammation from RT. Plan to recheck in November when she has her CT scan

## 2023-02-13 NOTE — Telephone Encounter (Signed)
-----   Message from Thornton Papas sent at 02/13/2023  1:01 PM EDT ----- Please call patient, CA125 is slightly higher, could be related to inflammation from recent radiation, follow-up as scheduled, repeat CA125 at next visit

## 2023-02-23 ENCOUNTER — Ambulatory Visit (HOSPITAL_COMMUNITY): Payer: PPO

## 2023-03-03 ENCOUNTER — Ambulatory Visit: Payer: PPO | Admitting: Gynecologic Oncology

## 2023-03-03 ENCOUNTER — Other Ambulatory Visit: Payer: PPO

## 2023-03-13 ENCOUNTER — Ambulatory Visit: Payer: PPO | Attending: Radiation Oncology

## 2023-03-13 VITALS — Wt 134.0 lb

## 2023-03-13 DIAGNOSIS — C561 Malignant neoplasm of right ovary: Secondary | ICD-10-CM | POA: Insufficient documentation

## 2023-03-13 NOTE — Therapy (Signed)
OUTPATIENT PHYSICAL THERAPY SOZO SCREENING NOTE   Patient Name: [name] MRN: [id number] DOB:11-Aug-1945, 77 y.o., female Today's Date: 03/13/2023  PCP: Assunta Found, MD REFERRING PROVIDER: Ronny Bacon,*   PT End of Session - 03/13/23 0908     Visit Number 1   # unchanged due to screen only   PT Start Time 0906    PT Stop Time 0910    PT Time Calculation (min) 4 min    Activity Tolerance Patient tolerated treatment well    Behavior During Therapy [facility] for tasks assessed/performed             Past Medical History:  Diagnosis Date   Anxiety    new dx   Atrial fibrillation (HCC)    Cancer (HCC) 1988, 2015   ovarian, adenocarcinoma   GERD (gastroesophageal reflux disease)    hx of years ago   Hx of radiation therapy 01/16/14-02/27/14   abdomen    Hypercholesteremia    under control with diet and fish oil   Hypertension    Ovarian cyst    PONV (postoperative nausea and vomiting)    Restless leg syndrome    Thyroid disease    Past Surgical History:  Procedure Laterality Date   APPENDECTOMY     APPLICATION OF A-CELL OF CHEST/ABDOMEN N/A 11/05/2013   Procedure: ABDOMINAL WALL RESCONTRUCTION WITH STRATUS;  Surgeon: Glenna Fellows, MD;  Location: [facility]S;  Service: Plastics;  Laterality: N/A;   CHOLECYSTECTOMY  2009   LAPAROTOMY N/A 11/05/2013   Procedure: RESECTION OF PELVIC MASS;  Surgeon: Rejeana Brock A. Duard Brady, MD;  Location: [facility];  Service: Gynecology;  Laterality: N/A;   LAPAROTOMY  03/2018   excision of suprapubic mass at [facility]   OOPHORECTOMY     BSO   TONSILLECTOMY  as child   TOTAL ABDOMINAL HYSTERECTOMY  1988   ovarian cyst  BSO   Patient Active Problem List   Diagnosis Date Noted   Incisional hernia, without obstruction or gangrene 08/30/2021   Genetic testing 04/06/2020   Hypothyroidism 02/03/2020   Restless leg syndrome 02/03/2020   Atrial fibrillation (HCC) 01/13/2015   Chest pain 01/13/2015   Ovarian ca (HCC) 11/05/2013   Metastatic  adenocarcinoma of ovary 08/20/2013   Abdominal wall mass of suprapubic region 08/20/2013   Adenocarcinoma of abdominal wall, unclear primary 08/09/2013   Neoplasm of abdominal wall of uncertain behavior 07/23/2013    REFERRING DIAG: right breast cancer at risk for lymphedema  THERAPY DIAG:  Malignant neoplasm of right ovary (HCC)  PERTINENT HISTORY: Diagnosed 1988 at time of hysterectomy with carcinoma of left ovary. In 2015 diagnosed with abdominal wall reoccurence in right rectus muscle and she had 3 rounds of chemo with no response. 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  She received carboplatin and Gemzar therapy. In August 2021 there appeared to be recurrence at the pubic symphysis of the soft tissue mass measuring  and repeat biopsy showed metastatic carcinoma consistent with metastatic clear cell carcinoma on 02/06/2020 she underwent exploratory laparotomy with resection of tumor densely adherent to the anterior aspect of the pubic arch at [facility] and a 5.5 cm aggregate clear cell carcinoma was identified, and carcinoma  Her tumor was ER negative PR 1+20%. She went on to receive palliative radiotherapy which she completed in December 2021 to  the tumor within and involving the pelvis. A PET on 11/30/22 showed hypermetabolic activity in the anterior margin of the left inferior pubic ramus and left inguinal node.  Dr. Mitzi Hansen would like to biopsy a groin LN to be sure it is recurrent disease, and if so 2-5 weeks of radiation.She's seen to consider additional radiotherapy to the pelvis and left groin region.   PRECAUTIONS: right UE Lymphedema risk, None  SUBJECTIVE: Pt returns for her first 3 month L-Dex screen.   PAIN:  Are you having pain? No  SOZO  SCREENING: Patient was assessed today using the SOZO machine to determine the lymphedema index score. This was compared to her baseline score. It was determined that she is within the recommended range when compared to her baseline and no further action is needed at this time. She will continue SOZO screenings. These are done every 3 months for 2 years post operatively followed by every 6 months for 2 years, and then annually.   L-DEX FLOWSHEETS - 03/13/23 0900       L-DEX LYMPHEDEMA SCREENING   Measurement Type Unilateral    L-DEX MEASUREMENT EXTREMITY Lower Extremity    POSITION  Standing    DOMINANT SIDE Right    At Risk Side Right    BASELINE SCORE (UNILATERAL) -7.1    L-DEX SCORE (UNILATERAL) -7.9    VALUE CHANGE (UNILAT) -0.8               [name], PTA 03/13/2023, 9:10 AM

## 2023-03-20 DIAGNOSIS — G894 Chronic pain syndrome: Secondary | ICD-10-CM | POA: Diagnosis not present

## 2023-03-20 DIAGNOSIS — Z6820 Body mass index (BMI) 20.0-20.9, adult: Secondary | ICD-10-CM | POA: Diagnosis not present

## 2023-03-27 ENCOUNTER — Ambulatory Visit (HOSPITAL_COMMUNITY)
Admission: RE | Admit: 2023-03-27 | Discharge: 2023-03-27 | Disposition: A | Discharge status: Home / Self Care | Payer: PPO | Source: Ambulatory Visit | Attending: Gynecologic Oncology | Admitting: Gynecologic Oncology

## 2023-03-27 ENCOUNTER — Ambulatory Visit: Payer: PPO

## 2023-03-27 DIAGNOSIS — R590 Localized enlarged lymph nodes: Secondary | ICD-10-CM | POA: Diagnosis not present

## 2023-03-27 DIAGNOSIS — I700 Atherosclerosis of aorta: Secondary | ICD-10-CM | POA: Insufficient documentation

## 2023-03-27 DIAGNOSIS — C801 Malignant (primary) neoplasm, unspecified: Secondary | ICD-10-CM | POA: Diagnosis not present

## 2023-03-27 DIAGNOSIS — K439 Ventral hernia without obstruction or gangrene: Secondary | ICD-10-CM | POA: Diagnosis not present

## 2023-03-27 DIAGNOSIS — R1903 Right lower quadrant abdominal swelling, mass and lump: Secondary | ICD-10-CM | POA: Insufficient documentation

## 2023-03-27 MED ORDER — IOHEXOL 300 MG/ML  SOLN
100.0000 mL | Freq: Once | INTRAMUSCULAR | Status: AC | PRN
  Administered 2023-03-27: 100 mL via INTRAVENOUS

## 2023-03-27 MED ORDER — IOHEXOL 300 MG/ML  SOLN: 100.0000 mL | Freq: Once | INTRAMUSCULAR | Status: DC | PRN

## 2023-03-30 ENCOUNTER — Other Ambulatory Visit: Payer: Self-pay | Admitting: Gynecologic Oncology

## 2023-03-30 DIAGNOSIS — C801 Malignant (primary) neoplasm, unspecified: Secondary | ICD-10-CM

## 2023-03-31 ENCOUNTER — Encounter: Payer: Self-pay | Admitting: Gynecologic Oncology

## 2023-03-31 ENCOUNTER — Inpatient Hospital Stay: Payer: PPO | Attending: Gynecologic Oncology | Admitting: Gynecologic Oncology

## 2023-03-31 ENCOUNTER — Inpatient Hospital Stay: Payer: PPO | Attending: Gynecologic Oncology

## 2023-03-31 VITALS — BP 122/67 | HR 60 | Temp 98.0°F | Resp 20 | Wt 139.8 lb

## 2023-03-31 DIAGNOSIS — Z9221 Personal history of antineoplastic chemotherapy: Secondary | ICD-10-CM | POA: Insufficient documentation

## 2023-03-31 DIAGNOSIS — Z8543 Personal history of malignant neoplasm of ovary: Secondary | ICD-10-CM

## 2023-03-31 DIAGNOSIS — Z90722 Acquired absence of ovaries, bilateral: Secondary | ICD-10-CM | POA: Diagnosis not present

## 2023-03-31 DIAGNOSIS — R971 Elevated cancer antigen 125 [CA 125]: Secondary | ICD-10-CM | POA: Diagnosis not present

## 2023-03-31 DIAGNOSIS — C801 Malignant (primary) neoplasm, unspecified: Secondary | ICD-10-CM

## 2023-03-31 DIAGNOSIS — Z90710 Acquired absence of both cervix and uterus: Secondary | ICD-10-CM | POA: Insufficient documentation

## 2023-03-31 DIAGNOSIS — Z923 Personal history of irradiation: Secondary | ICD-10-CM | POA: Diagnosis not present

## 2023-03-31 DIAGNOSIS — R978 Other abnormal tumor markers: Secondary | ICD-10-CM | POA: Diagnosis not present

## 2023-03-31 DIAGNOSIS — C7989 Secondary malignant neoplasm of other specified sites: Secondary | ICD-10-CM | POA: Diagnosis not present

## 2023-03-31 NOTE — Progress Notes (Signed)
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 Pathology report says "focal well differentiated serous cystadenocarcinoma". The comment is that the adenocarcinomatous component is limited to rare foci of small invasive glandular structures. Another report states that there is a small focus of papillary adenocarcinoma in a serous cystadenofibroma of the left ovary. Uterus, cervix, left fallopian tube, right adnexa and pelvic washings all without evidence of malignancy. Appendix also removed at the time of surgery.  Her CA-125 at that time was reported to be 33. She did not receive adjuvant therapy for her initial cancer diagnosis.  She presented in 07/2013 with abdominal pain and mass was noted along right inferior rectus muscle. Biopsy of the mass showed adenocarcinoma of unknown primary. She received 3 cycles of neoadjuvant chemotherapy (no response), carboplatin/taxol. After resection, she underwent RT due to positie tumor margin. This was completed in 02/2014.  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 Lower abdominal/suprapubic ventral wall lobulated soft tissue  mass, arising within the lower rectus musculature more so on the  right. 5.5 x 7.9 x 9.7 cm. Favor sarcoma or other connective tissue  tumor. This should be amenable to percutaneous biopsy. In a younger  female patient, endometriosis within a Cesarean section scar would  also be a consideration for this appearance.  2. Narrow fat plane between the mass in #1  and underlying small  bowel and bladder. No lymphadenopathy. No ascites.  3. Surgically absent gallbladder, uterus, and adnexa. Diverticulosis  of the colon.  Study discussed by telephone with PA BENJAMIN MANN on 07/19/2013 at  11:35 .    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 Microscopic Comment Needle core biopsies demonstrate diffuse involvement by well differentiated adenocarcinoma. The adenocarcinoma has the following immunophenotype: Cytokeratin 7 - strong diffuse expression Cytokeratin 20 - negative expression CDX2 - negative expression TTF-1 - negative expression Estrogen receptor - negative expression Vimentin - patchy mild to moderate expression   08/16/2013 - 09/27/2013 Chemotherapy   3 cycle of carboplatin, paclitaxel    10/11/2013 Imaging   CT A/P: A heterogeneous mass centered in the inferior right rectus abdominus  muscle measures 5.3 x 7.5 cm (previously 5.5 x 7.9 cm). No  pathologically enlarged lymph nodes. Scattered atherosclerotic  calcification of the arterial vasculature without abdominal aortic  aneurysm. No free fluid. No worrisome lytic or sclerotic lesions.  Degenerative changes are seen in the spine.   IMPRESSION:  Right rectus abdominus mass is stable to very minimally smaller than  on 07/19/2013.   11/05/2013 Surgery   Resection abdominal wall mass, abdominal wall reconstruction with Strattice matrix (10x11cm)  Operative findings: 8 involving the subcutaneous tissues, fascia, and rectus on the right side. No obvious intraperitoneal disease. Normal-appearing omentum   11/05/2013 Pathology Results   Soft tissue mass, simple excision, abdominal wall mass ADENOCARCINOMA. Microscopic Comment The specimen is extensively involved by adenocarcinoma which focally involves the  edge of the specimen. Immunohistochemistry  is performed and the tumor shows patchy positivity with Napsin-A and is negative with WT-1, estrogen receptor, progesterone receptor, gross disease cystic fluid protein, CDX-2, carcinoembryonic antigen, thyroid transcription factor-1, CD10 and CD117. The immunophenotype is nonspecific and additional immunohistochemistry will be performed and reported as an addendum. (JDP:kh 11/07/13) ADDENDUM: Additional immunohistochemistry is performed and the tumor is positive with cytokeratin AE1/AE3 and cytokeratin 7 and is negative with Calretinin, cytokeratin 5/6 and cytokeratin 20. The immunoreactivity is not specific for location of a primary tumor. (JDP:kh 11-08-13)   11/05/2013 Genetic Testing   [facility] testing MYC amplification - equivocal CREBBP Q1765* No FDA approved therapies in patient's tumor type or another tumor type    01/16/2014 - 02/27/2014 Radiation Therapy   45 gray to the target region within the abdominal wall postoperative area. She received 45 gray using a 4 field 3-D conformal technique at 1.8 gray per fraction. The patient then received a boost using a cone down 4 field technique for an additional 9 Gray. The final dose was 54 gray.   01/29/2018 Pathology Results   Soft Tissue Needle Core Biopsy, ant abd wall mass - ADENOCARCINOMA. - SEE MICROSCOPIC DESCRIPTION. Microscopic Comment The core biopsies consist of abundant dense, hyalinized fibrous tissue with scattered glands with cytologic atypia consistent with adenocarcinoma. Immunohistochemistry shows the tumor is positive with cytokeratin AE1/AE3 and cytokeratin 7 and is negative with cytokeratin 5/6, Calretinin, WT-1, estrogen receptor, progesterone receptor and cytokeratin 20. The morphology and immunophenotype are similar to the morphology in the abdominal wall mass excision from 11/07/2013 [phone]).   03/01/2018 Imaging   PET: 1. Hypermetabolism associated with the cystic and  solid suprapubic mass compatible with the reported clinical history of adenocarcinoma recurrence. Cystic components in the cranial aspect of the lesion are largely devoid of FDG accumulation with the most hypermetabolic tissue being inferiorly, immediately adjacent to the symphysis pubis. 2. Focal uptake identified in the left L3-4 facets without underlying bony lesion. This is presumed to represent uptake related to degenerative change. 3. No other sites of unexpected or suspicious hypermetabolism in the neck, chest, abdomen, or pelvis   03/02/2018 Imaging   CT A/P: 6.7 cm mass in the lower anterior abdominal wall containing cystic and solid components, suspicious for recurrence of previously resected/treated adenocarcinoma in this region. This abuts and could invade the anterior wall of the bladder.   No evidence for more distant metastatic disease within the abdomen or pelvis.    03/28/2018 Surgery   Exploratory laparotomy, resection abdominal wall tumor, oversew of bladder peritoneum, cystoscopy.  Findings: normal external genitalia, unable to palpate mass on exam. After opening the fascia, a cystic and solid mass was encountered below the lower edge of the incision, extending into the space of retzius and adherent to the pubic arch inferiorly, the bladder dome posteriorly, and prior abdominal wall repair anteriorly. At conclusion of the case, no nodularity was palpated in the tumor bed. Visualized bowel and omentum were free of disease. Pelvic peritoenum appeared normal. Cystoscopy revealed intact bladder mucosa with no visible suture. Bilateral ureteral orfices visualized.   03/28/2018 Pathology Results   Pelvic tumor, excision - Metastatic high grade adenocarcinoma, favor clear cell carcinoma of gynecologic origin, fragments up to 5.8 cm in size - See comment In the prior biopsy specimen [id number], the malignant glands were positive for AE1/AE3 and CK7, and negative for CK20,  CK5/6, calretinin, WT-1, ER, and PR. Additional immunohistochemical studies performed on block A5 in the current specimen demonstrates that the tumor is  positive for Napsin A and PAX-8, and is negative for TTF-1. 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   03/28/2018 Genetic Testing   [facility] No reportable alterations with companion diagnostic claims MS-stable, TMB - 4 Muts/Mb, CREBBP [id number]*   05/21/2018 - 09/08/2018 Chemotherapy   Patient is on Treatment Plan : OVARIAN Carboplatin/Gemcitabine q21d      Pathology Results   A. SOFT TISSUE MASS, PELVIC BODY WALL, NEEDLE CORE BIOPSY:  - Metastatic adenocarcinoma, see comment.   COMMENT:   The tumor has malignant cells with cleared cytoplasm.  Immunohistochemistry is positive for cytokeratin 7, PAX8, racemase, and NapsinA. The cells are negative for ER, WT-1, and CD10. 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 Dr. Truett Perna was notified on 01/08/20.    10/19/2018 Imaging   CT A/P: 1. Previously noted lower anterior abdominal wall mass is no longer identified, presumably surgically resected. Cephalad to the site of the prior mass within the low anterior abdominal wall there is some soft tissue thickening seen on today's examination (axial image 54 of series 2). This is nonspecific and will serve as a baseline for future follow-up examinations. 2. No signs of metastatic disease elsewhere in the abdomen or pelvis. 3. Colonic diverticulosis without evidence of acute diverticulitis at this time. 4. Aortic atherosclerosis. 5. Additional incidental findings, as above.   04/18/2019 Imaging   CT A/P: 1.  No evidence of recurrent or metastatic carcinoma within the abdomen or pelvis. 2. Colonic diverticulosis. No radiographic evidence of diverticulitis.   12/23/2019 Imaging   CT A/P: Within the ventral, midline abdominal wall soft tissue mass is identified anterior to the pubic symphysis measuring 5.2 x 3.3 by 3.4 cm (volume = 31 cm^3), image 59/5 and image 78/2. This is compared with 4.9 x 3.0 by 2.6 (volume = 20)cm IMPRESSION: 1. Stable to slight increase in size of ventral, midline abdominal wall soft tissue mass at the level of the pubic symphysis. 2. No signs of solid organ or nodal metastases. 3. Aortic atherosclerosis.   01/06/2020 Relapse/Recurrence   Anterior abdominal wall mass biopsy: A. SOFT TISSUE MASS, PELVIC BODY WALL, NEEDLE CORE BIOPSY:  - Metastatic adenocarcinoma, see comment.   COMMENT:   The tumor has malignant cells with cleared cytoplasm.  Immunohistochemistry is positive for cytokeratin 7, PAX8, racemase, and  NapsinA. The cells are negative for ER, WT-1, and CD10. The morphology  and immunophenotype are consistent with metastatic clear cell carcinoma  of gynecologic origin (by definition  high grade). Dr. Truett Perna was  notified on 01/08/20.    02/06/2020 Surgery   Exlap with resection of pelvic tumor densely adherent to the anterior aspect of the pubic arch. Surgery at [facility]  nodular mass palpable, fixed to the pubic arch on exam. On dissection into the mons, an irregularly shaped and nodular tumor was implanted on the pubic arch. No evidence of disease on visualized bowel or omentum. The palpated anterior abdominal wall was free of disease. The bladder dome peritoneum was free of disease. Few soft nodules on the anterior wall peritoneum were ablated using the bovie. No palpable tumor on or surrounding the pubic arch following resection.   02/06/2020 Pathology Results   A: "Pelvic cavity, pubic mons tumor, excision - Clear  cell carcinoma, received fragmented, 5.5  cm in aggregate (see comment) - Carcinoma involves multiple cauterized tissue edges  ER negative, PR 1+ (20%)   04/06/2020 Genetic Testing   Negative genetic testing on the TumorNext-[facility]+CancerNext testing.  Somatic testing was negative and no germline mutations identified.  The CancerNext gene panel offered by [employer] includes sequencing and rearrangement analysis for the following 34 genes:   APC, ATM, BARD1, BMPR1A, BRCA1, BRCA2, BRIP1, CDH1, CDK4, CDKN2A, CHEK2, DICER1, HOXB13, EPCAM, GREM1, MLH1, MRE11A, MSH2, MSH6, MUTYH, NBN, NF1, PALB2, PMS2, POLD1, POLE, PTEN, RAD50, RAD51C, RAD51D, SMAD4, SMARCA4, STK11, and TP53.  The report date is April 06, 2020.   05/19/2020 Imaging   CT A/P: Interval resolution of suprapubic abdominal wall mass since prior study. No residual metastatic disease identified. Colonic diverticulosis, without radiographic evidence of diverticulitis. Moderate right and small left hernias.   11/26/2020 Imaging   11/26/20: Status post hysterectomy and bilateral salpingo oophorectomy. No evidence of recurrent or metastatic disease. Additional stable ancillary findings as above.   05/27/2021 Imaging   CT A/P: 1. Stable examination status post hysterectomy and bilateral salpingo-oophorectomy, without evidence of recurrent or metastatic disease within the abdomen or pelvis. 2. Moderate volume of formed stool throughout the colon suggestive of constipation. 3. Sigmoid colonic diverticulosis without findings of acute diverticulitis. 4.  Aortic Atherosclerosis (ICD10-I70.0).   02/28/2022 Imaging   CT A/P: 1. No acute intra-abdominal or pelvic pathology. No evidence of recurrent or metastatic disease. 2. Sigmoid diverticulosis. 3. Progression of broad-based herniation of the bowel into the anterior pelvic wall. No bowel obstruction. 4.  Aortic Atherosclerosis (ICD10-I70.0).   Adenocarcinoma of abdominal wall, unclear primary  08/09/2013 Initial  Diagnosis   Adenocarcinoma of abdominal wall, unclear primary   08/16/2013 - 09/25/2013 Chemotherapy   paclitaxel and carboplatin   11/05/2013 Surgery   resection abdominal wall mass    - 02/27/2014 Radiation Therapy   Completed volume directed radiation   01/06/2020 Relapse/Recurrence   Anterior abdominal wall mass biopsy: A. SOFT TISSUE MASS, PELVIC BODY WALL, NEEDLE CORE BIOPSY:  - Metastatic adenocarcinoma, see comment.   COMMENT:   The tumor has malignant cells with cleared cytoplasm.  Immunohistochemistry is positive for cytokeratin 7, PAX8, racemase, and  NapsinA. The cells are negative for ER, WT-1, and CD10. The morphology  and immunophenotype are consistent with metastatic clear cell carcinoma  of gynecologic origin (by definition  high grade). Dr. Truett Perna was  notified on 01/08/20.    02/06/2020 Surgery   Exlap with resection of pelvic tumor densely adherent to the anterior aspect of the pubic arch. Surgery at [facility]  nodular mass palpable, fixed to the pubic arch on exam. On dissection into the mons, an irregularly shaped and nodular tumor was implanted on the pubic arch. No evidence of disease on visualized bowel or omentum. The palpated anterior abdominal wall was free of disease. The bladder dome peritoneum was free of disease. Few soft nodules on the anterior wall peritoneum were ablated using the bovie. No palpable tumor on or surrounding the pubic arch following resection.   02/06/2020 Pathology Results   A: "Pelvic cavity, pubic mons tumor, excision - Clear cell carcinoma, received fragmented, 5.5 cm in aggregate (see comment) - Carcinoma involves multiple cauterized tissue edges  ER negative, PR 1+ (20%)   Metastatic adenocarcinoma of ovary  05/03/2018 Initial Diagnosis   Malignant neoplasm of ovary (HCC)   05/21/2018 - 09/08/2018 Chemotherapy   Patient is on Treatment Plan : OVARIAN Carboplatin/Gemcitabine q21d  CA 125 from 06/02/2022: 20.3 CT imaging on  08/24/2022 with no acute findings, no evidence of recurrence or metastatic disease. CA-125 from 10/28/22: 37 CT imaging on 11/04/22 with mild increase in size of 3 cm rounded soft tissue density along the inferior aspect of the suprapubic hernia and anterior margin of the right pubic bone.  No other sites of metastatic disease seen. PET on 11/25/22: Hypermetabolic soft tissue mass along the anterior margin of the left inferior pubic ramus with adjacent hypermetabolic left inguinal lymph node, consistent with disease recurrence.  No hypermetabolism of the vague soft tissue along the ventral margin of the right pubic bone. 12/28/22: CT biopsy of the left pectineus nodule reveals metastatic adenocarcinoma morphologically consistent with known history of clear-cell carcinoma. [facility]: HRP, MSS, TMB low HER2+ (2021) by [facility]  9/5-9/18/24: Pelvic IMRT, 50 Gy in 10 Fxs  Interval History: Overall doing well.  Tolerated radiation without significant issues.  Had a couple of episodes of diarrhea, now resolved.  Denies any vaginal bleeding.  Has occasional mild deep pelvic discomfort, denies any significant pain.  Reports normal bowel function.  Denies any bladder symptoms.  Past Medical/Surgical History: Past Medical History:  Diagnosis Date   Anxiety    new dx   Atrial fibrillation (HCC)    Cancer (HCC) 1988, 2015   ovarian, adenocarcinoma   GERD (gastroesophageal reflux disease)    hx of years ago   Hx of radiation therapy 01/16/14-02/27/14   abdomen    Hypercholesteremia    under control with diet and fish oil   Hypertension    Ovarian cyst    PONV (postoperative nausea and vomiting)    Restless leg syndrome    Thyroid disease     Past Surgical History:  Procedure Laterality Date   APPENDECTOMY     APPLICATION OF A-CELL OF CHEST/ABDOMEN N/A 11/05/2013   Procedure: ABDOMINAL WALL RESCONTRUCTION WITH STRATUS;  Surgeon: Glenna Fellows, MD;  Location: [facility];  Service: Plastics;   Laterality: N/A;   CHOLECYSTECTOMY  2009   LAPAROTOMY N/A 11/05/2013   Procedure: RESECTION OF PELVIC MASS;  Surgeon: Rejeana Brock A. Duard Brady, MD;  Location: [facility];  Service: Gynecology;  Laterality: N/A;   LAPAROTOMY  03/2018   excision of suprapubic mass at [facility]   OOPHORECTOMY     BSO   TONSILLECTOMY  as child   TOTAL ABDOMINAL HYSTERECTOMY  1988   ovarian cyst  BSO    Family History  Problem Relation Age of Onset   Heart disease Mother    Heart disease Sister    Diabetes Sister    Cancer Sister        melanoma-skin cancer   Breast cancer Paternal Grandmother        Age 52's   Cancer Paternal Grandmother        breast   Cancer Paternal Grandfather        prostate/bladder   Colon cancer Neg Hx    Ovarian cancer Neg Hx    Endometrial cancer Neg Hx    Pancreatic cancer Neg Hx    Prostate cancer Neg Hx     Social History   Socioeconomic History   Marital status: Single    Spouse name: Not on file   Number of children: Not on file   Years of education: Not on file   Highest education level: Not on file  Occupational History   Not on file  Tobacco Use   Smoking status: Never   Smokeless tobacco: Never  Vaping Use   Vaping status: Never Used  Substance and Sexual Activity   Alcohol use: No    Alcohol/week: 0.0 standard drinks of alcohol   Drug use: No   Sexual activity: Never    Birth control/protection: Surgical    Comment: HYST  Other Topics Concern   Not on file  Social History Narrative   Single-never married   No children or pets   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   Enjoys reading, keeps house tidy, 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 at [employer]: Low Risk  (02/07/2020)   Received from [facility]   Overall Financial Resource Strain (CARDIA)    Difficulty of Paying Living Expenses: Not hard at all  Food Insecurity: No Food Insecurity (12/07/2022)   Hunger  Vital Sign    Worried About Running Out of Food in the Last Year: Never true    Ran Out of Food in the Last Year: Never true  Transportation Needs: No Transportation Needs (12/07/2022)   PRAPARE - Administrator, Civil Service (Medical): No    Lack of Transportation (Non-Medical): No  Physical Activity: Not on file  Stress: Not on file  Social Connections: Not on file    Current Medications:  Current Outpatient Medications:    ALPRAZolam (XANAX) 0.5 MG tablet, Take 0.5 mg by mouth at bedtime., Disp: , Rfl:    apixaban (ELIQUIS) 5 MG TABS tablet, TAKE (1) TABLET BY MOUTH TWICE DAILY., Disp: 180 tablet, Rfl: 1   cholecalciferol (VITAMIN D3) 25 MCG (1000 UNIT) tablet, Take 1,000 Units by mouth daily., Disp: , Rfl:    conjugated estrogens (PREMARIN) vaginal cream, Finger tip amount of cream, applied to vulva and outer vagina at night 3 x a week, Disp: 42.5 g, Rfl: 6   HYDROcodone-acetaminophen (NORCO/VICODIN) 5-325 MG tablet, Take 1 tablet by mouth 3 (three) times daily as needed., Disp: , Rfl:    levothyroxine (SYNTHROID) 50 MCG tablet, Take 50 mcg by mouth daily., Disp: , Rfl:    metoprolol tartrate (LOPRESSOR) 25 MG tablet, TAKE (1/2) TABLET BY MOUTH TWICE DAILY., Disp: 90 tablet, Rfl: 3   Multiple Vitamin (MULTIVITAMIN WITH MINERALS) TABS tablet, Take 1 tablet by mouth daily., Disp: , Rfl:    pramipexole (MIRAPEX) 0.125 MG tablet, Take 0.125 mg by mouth 2 (two) times daily., Disp: , Rfl:   Review of Systems: Denies appetite changes, fevers, chills, fatigue, unexplained weight changes. Denies hearing loss, neck lumps or masses, mouth sores, ringing in ears or voice changes. Denies cough or wheezing.  Denies shortness of breath. Denies chest pain or palpitations. Denies leg swelling. Denies abdominal distention, pain, blood in stools, constipation, diarrhea, nausea, vomiting, or early satiety. Denies pain with intercourse, dysuria, frequency, hematuria or incontinence. Denies  hot flashes, pelvic pain, vaginal bleeding or vaginal discharge.   Denies joint pain, back pain or muscle pain/cramps. Denies itching, rash, or wounds. Denies dizziness, headaches, numbness or seizures. Denies swollen lymph nodes or glands, denies easy bruising or bleeding. Denies anxiety, depression, confusion, or decreased concentration.  Physical Exam: BP 122/67 (BP Location: Left Arm, Patient Position: Sitting)   Pulse 60   Temp 98 F (36.7 C) (Oral)   Resp 20   Wt 139 lb 12.8 oz (63.4 kg)   SpO2 100%   BMI 22.56 kg/m  General: Alert, oriented, no acute distress. HEENT: Normocephalic, atraumatic, sclera anicteric. Chest:  Clear to auscultation bilaterally.  No wheezes or rhonchi. Cardiovascular: Regular rate and rhythm, no murmurs. Abdomen: soft, nontender.  Normoactive bowel sounds.  No masses or hepatosplenomegaly appreciated.  Well-healed incisions.  Extremities: Grossly normal range of motion.  Warm, well perfused.  No edema bilaterally. Skin: No rashes or lesions noted. Lymphatics: No cervical, supraclavicular. Shotty adenopathy in left inguinal area. GU: Edema of the mons consistent with known hernias, right greater than left.  Hernia stable in size since last visit. There is mild erythema between the labia minora and majora bilaterally.  This is more prominent on the right with some petechia along the inner right labia.  Loss of architecture of bilateral labia noted as well as atrophy.   Speculum exam not well-tolerated, vaginal mucosa atrophic, no lesions noted.  Bimanual exam with single digit reveals no masses or nodularity.    Laboratory & Radiologic Studies: CT A/P on 03/27/23: 1. New small nodule in the right lower quadrant deep to the anterior abdominal wall, suspicious for peritoneal metastatic disease. Stable soft tissue nodule within the suprapubic hernia, anterior to the right symphysis pubis. This could reflect a peritoneal metastasis, although did not appear  hypermetabolic on recent PET-CT. No ascites or generalized peritoneal carcinomatosis.  2. Unchanged mildly enlarged left inguinal lymph node, hypermetabolic on PET-CT. 3. Ill-defined mass in the medial adductor musculature of the left hip is less well-defined and appears smaller, suggesting response to therapy. 4. Stable suprapubic abdominal wall hernia containing small bowel and a portion of the transverse colon. No evidence of incarceration or obstruction. 5.  Aortic Atherosclerosis (ICD10-I70.0).  Component Ref Range & Units 1 mo ago (02/09/23) 3 mo ago (12/29/22) 4 mo ago (12/01/22) 5 mo ago (10/28/22) 10 mo ago (06/02/22) 1 yr ago (03/15/22) 1 yr ago (12/02/21)  Cancer Antigen (CA) 125 0.0 - 38.1 U/mL 58.0 High  32.4 CM 39.1 High  CM 37.0 CM 20.3 CM 26.2 CM 20.   Assessment & Plan: [name] is a 77 y.o. 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 Foundation One: no actionable target. HRP, MSS, TMB low. HER2+ (2021) by [facility]  Patient is overall doing well.  She tolerated radiation without difficulty.  She remains basically asymptomatic with regards to her cancer.  Unfortunately, CT scan shows progression of disease.  We looked at images together and reviewed her report.  She had previously discussed systemic therapy with her medical oncologist.  Given lack of response previously to traditional chemotherapy and in the setting of HER2 positive tumor, I would recommend targeted therapy with Enhertu.  She meets with Dr. Truett Perna next week.  Ca-125 drawn today.  Follow-up in 4 months or sooner as needed.  22 minutes of total time was spent for this patient encounter, including preparation, face-to-face counseling with the patient and coordination of care, and documentation of the encounter.  Eugene Garnet, MD  Division of Gynecologic Oncology  Department of Obstetrics and Gynecology   [facility]

## 2023-03-31 NOTE — Patient Instructions (Signed)
It was good to see you today.  I will reach out to Dr. Truett Perna about our conversation.  I will plan to see you in 4 months.  Please call me if you need anything before that.

## 2023-04-02 LAB — CA 125: Cancer Antigen (CA) 125: 26.3 U/mL (ref 0.0–38.1)

## 2023-04-03 ENCOUNTER — Inpatient Hospital Stay: Payer: PPO | Admitting: Oncology

## 2023-04-03 ENCOUNTER — Telehealth: Payer: Self-pay

## 2023-04-03 VITALS — BP 121/52 | HR 54 | Temp 97.8°F | Resp 18 | Ht 66.0 in | Wt 134.1 lb

## 2023-04-03 DIAGNOSIS — C569 Malignant neoplasm of unspecified ovary: Secondary | ICD-10-CM | POA: Diagnosis not present

## 2023-04-03 DIAGNOSIS — C7989 Secondary malignant neoplasm of other specified sites: Secondary | ICD-10-CM | POA: Diagnosis not present

## 2023-04-03 NOTE — Telephone Encounter (Signed)
Per Warner Mccreedy NP, Pt is aware of recent CA125 being 26.3. Advised her to continue with the plan of meeting with Dr.Sherrill. She voiced an understanding

## 2023-04-03 NOTE — Progress Notes (Signed)
[facility] OFFICE PROGRESS NOTE   Diagnosis: Gynecologic malignancy  INTERVAL HISTORY:   [name] returns as scheduled.  She feels well.  She does not have consistent pain.  Good appetite.  No difficulty with bowel or bladder function.  She completed radiation to the left groin 02/01/2023.  She saw Dr. Pricilla Holm 03/31/2023.  A CT 03/27/2023.  There was a new nodule in the right lower quadrant abdominal wall.  No other evidence of disease progression.  Objective:  Vital signs in last 24 hours:  Blood pressure (!) 121/52, pulse (!) 54, temperature 97.8 F (36.6 C), temperature source Oral, resp. rate 18, height 5\' 6"  (1.676 m), weight 134 lb 1.6 oz (60.8 kg), SpO2 100%.    Lymphatics: No cervical, supraclavicular, axillary, or inguinal nodes Resp: Lungs clear bilaterally Cardio: Regular rate and rhythm GI: No hepatosplenomegaly, no mass, nontender, small suprapubic hernias Vascular: No leg edema  Lab Results:  Lab Results  Component Value Date   WBC 6.0 12/28/2022   HGB 11.9 (L) 12/28/2022   HCT 36.7 12/28/2022   MCV 97.9 12/28/2022   PLT 249 12/28/2022   NEUTROABS 4.0 05/06/2020    CMP  Lab Results  Component Value Date   NA 142 05/25/2021   K 4.7 05/25/2021   CL 106 05/25/2021   CO2 31 05/25/2021   GLUCOSE 110 (H) 05/25/2021   BUN 21 05/25/2021   CREATININE 0.89 05/25/2021   CALCIUM 10.1 05/25/2021   PROT 6.6 05/25/2021   ALBUMIN 4.0 05/25/2021   AST 30 05/25/2021   ALT 31 05/25/2021   ALKPHOS 49 05/25/2021   BILITOT 0.3 05/25/2021   GFRNONAA >60 05/25/2021   GFRAA >60 12/25/2019    Lab Results  Component Value Date   CA125 10 03/17/2016     Medications: I have reviewed the patient's current medications.   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   Elevated CA 125.   Cycle 1  Taxol/carboplatin 08/16/2013.   Cycle 2 Taxol/carboplatin 09/06/2013.   Cycle 3 Taxol/carboplatin 09/27/2013   CT 10/11/2013 with a stable abdominal wall mass. Status post resection of abdominal wall mass with abdominal wall reconstruction 11/05/2013. Final pathology showed adenocarcinoma. Specimen extensively involved with adenocarcinoma which focally involved the edge of the specimen. Immunostains and Foundation 1 testing were nonspecific for a primary tumor location. Adjuvant radiation to the abdominal wall 01/16/2014 through 02/27/2014 CT 01/15/2018 while in the emergency room for evaluation of back pain- midline abdominal wall mass above the pubic symphysis and adjacent to the bladder Ultrasound-guided biopsy of the abdominal wall mass 01/25/2018-adenocarcinoma similar to the 2015 biopsy PET scan 03/01/2018-hypermetabolism associated with a cystic and solid suprapubic mass.  Focal uptake identified in the left L3-4 facets without underlying bony lesion.  No other sites of unexpected or suspicious hypermetabolism in the neck, chest, abdomen or pelvis. Exploratory laparotomy, resection of abdominal wall tumor, oversew of bladder peritoneum, cystoscopy 03/28/2018 (findings included a cystic and solid mass encountered below the lower edge of the incision extending into the space of retzius and adherent to the pubic arch inferiorly, bladder dome posteriorly and prior abdominal wall repair anteriorly.  No nodularity palpated in the tumor bed.  Visualized bowel and omentum free of disease.  Pelvic peritoneum appeared normal).  A "small "amount of tumor on the periosteum was cauterized using a Bovie. Pathology on pelvic tumor-metastatic high-grade  adenocarcinoma, favor clear-cell carcinoma of gynecologic origin, fragments up to 5.8 cm in size; ER negative, PR positive, 1+ (weak staining) to focally 2+, 30-40%, MSI-stable, mutation burden-4,CREBBP alteration Cycle 1 gemcitabine/carboplatin 05/21/2018; day 8 held  due to neutropenia Cycle 2 gemcitabine/carboplatin 06/11/2018 (day 8 gemcitabine eliminated) Cycle 3 gemcitabine/carboplatin 07/06/2018 Cycle 4 gemcitabine/carboplatin 07/27/2018 (Udenyca added) Cycle 5 gemcitabine/carboplatin 08/17/2018 Cycle 6 gemcitabine/carboplatin 09/07/2018  CT 10/19/2018- low anterior abdominal wall mass no longer identified, soft tissue thickening cephalad to the site of prior mass-nonspecific, no additional evidence of metastatic disease CT 04/18/2019-no evidence of recurrent or metastatic disease CT 12/23/2019-slight increase in size of ventral midline soft tissue mass near the pubis, no other evidence of disease progression Biopsy pelvic body wall soft tissue mass 01/06/2020-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 Surgical excision of the soft tissue mass adherent to the pubis 02/06/2020-clear cell carcinoma, positive surgical margins Adjuvant radiation 03/25/2020- 04/29/2020 CT abdomen/pelvis 05/19/2020- resolution of suprapubic abdominal wall mass, no evidence of metastatic disease, moderate right and small left hernias CT abdomen/pelvis 11/26/2020-no recurrence of the lower pelvic soft tissue mass, small bilateral lower pelvic hernias, no evidence of recurrent or metastatic disease CT abdomen/pelvis 05/27/2021-no evidence of recurrent disease CT abdomen/pelvis 02/28/2022-no evidence of recurrent disease progressive bowel herniation in the anterior pelvic wall CT abdomen/pelvis 08/24/2022-no evidence of recurrent disease CT abdomen/pelvis 11/04/2022-mild increase in size of a soft tissue density at the inferior aspect of the suprapubic hernia and anterior margin of the right pubic bone PET 11/30/2022-hypermetabolic soft tissue mass at the anterior margin of the left inferior pubic ramus, hypermetabolic adjacent left inguinal node, no evidence of distant metastatic disease CT biopsy left soft  tissue mass left pectineus muscle 12/28/2022-metastatic adenocarcinoma morphologically consistent with patient's known history of clear-cell carcinoma of gynecologic origin, HRD negative, MSS, tumor mutation burden 7, CREBBP alteration Radiation to the left pubis mass 01/19/2023 - 02/01/2023, 50 Gray in 10 fractions CT abdomen/pelvis 03/27/2023-new small nodule in the right lower quadrant deep to the anterior abdominal wall, unchanged mildly enlarged left inguinal node, ill-defined mass in the medial adductor musculature on the left less well-defined  Ovarian cancer in 1988, low-grade adenocarcinoma with areas of "borderline" carcinoma, status post a hysterectomy and bilateral oophorectomy. New onset atrial fibrillation August 2016, maintained on eliquis Recurrent urinary tract infections Restless legs       Disposition: [name] appears stable.  She appears asymptomatic from the gynecologic cancer.  I reviewed the restaging CT findings and images with her.  The CA125 is normal.  It appears the "new "right low abdomen/pelvic nodule was present on the CT in June.  There was no other radiologic evidence of disease progression.  We discussed continuing observation versus initiating systemic therapy.  She appears to be a candidate for HER2 directed therapy.  We discussed Enhertu.  We reviewed potential toxicities associated with this agent including the chance of hematologic toxicity, infection, bleeding, and pneumonitis.  She is comfortable with observation for now.  She will return for an office visit and CA125 in approximately 6 weeks.  Thornton Papas, MD  04/03/2023  10:43 AM

## 2023-04-03 NOTE — Telephone Encounter (Signed)
-----   Message from Doylene Bode sent at 04/03/2023  8:46 AM EST ----- Please let her know her CA 125 level is at 26.3. Continue with plan to meet with Dr. Truett Perna

## 2023-04-04 NOTE — Progress Notes (Signed)
Please let her know that CA-125 is back down to 26. I think Dr. Truett Perna let her know this yesterday

## 2023-04-07 ENCOUNTER — Telehealth: Payer: Self-pay | Admitting: *Deleted

## 2023-04-07 NOTE — Telephone Encounter (Signed)
Spoke with the patient.  Discussed decision about starting treatment now versus watchful waiting given that she is asymptomatic and CA125 had come down.  Ultimately, I think she is going to wait to see Dr. Truett Perna after the holidays.  We discussed that she can always start targeted therapy at that time if she is having any symptoms or CA125 increases.

## 2023-04-07 NOTE — Telephone Encounter (Signed)
Spoke with patient who called the office to speak with Dr. Pricilla Holm or relay message to her. Patient would like advice in regards to starting chemotherapy. Pt has a follow up appointment with Dr. Truett Perna on January 2nd. Pt is not sure she wants to wait that long to start her chemotherapy and would like to discuss this further with Dr.Tucker. Advised patient her message would be relayed to provider. Pt left her best contact info: [phone] (H) and [phone] (C).

## 2023-04-07 NOTE — Telephone Encounter (Signed)
Called the patient. No answer, left message on her home number. Tried the cell number - it sounded like someone picked up but I could not hear anything.

## 2023-04-17 ENCOUNTER — Other Ambulatory Visit: Payer: Self-pay | Admitting: Cardiology

## 2023-04-17 NOTE — Telephone Encounter (Signed)
Prescription refill request for Eliquis received. Indication: PAF Last office visit: 04/28/22  Shawnie Dapper MD Scr: 0.7 on 04/11/22  [facility] Age: 77 Weight: 61kg  Based on above findings Eliquis 5mg  twice daily is the appropriate dose.  Refill approved.  Labs requested at upcoming appt with Dr Wyline Mood.

## 2023-04-18 ENCOUNTER — Other Ambulatory Visit: Payer: Self-pay | Admitting: Family Medicine

## 2023-04-18 DIAGNOSIS — Z1231 Encounter for screening mammogram for malignant neoplasm of breast: Secondary | ICD-10-CM

## 2023-05-18 ENCOUNTER — Inpatient Hospital Stay: Payer: HMO | Attending: Gynecologic Oncology | Admitting: Oncology

## 2023-05-18 ENCOUNTER — Other Ambulatory Visit: Payer: PPO

## 2023-05-18 ENCOUNTER — Inpatient Hospital Stay: Payer: HMO

## 2023-05-18 VITALS — BP 122/60 | HR 62 | Temp 98.1°F | Resp 18 | Ht 66.0 in | Wt 135.1 lb

## 2023-05-18 DIAGNOSIS — Z8543 Personal history of malignant neoplasm of ovary: Secondary | ICD-10-CM | POA: Diagnosis not present

## 2023-05-18 DIAGNOSIS — C569 Malignant neoplasm of unspecified ovary: Secondary | ICD-10-CM | POA: Diagnosis not present

## 2023-05-18 DIAGNOSIS — I4891 Unspecified atrial fibrillation: Secondary | ICD-10-CM | POA: Insufficient documentation

## 2023-05-18 DIAGNOSIS — Z87440 Personal history of urinary (tract) infections: Secondary | ICD-10-CM | POA: Insufficient documentation

## 2023-05-18 DIAGNOSIS — Z923 Personal history of irradiation: Secondary | ICD-10-CM | POA: Insufficient documentation

## 2023-05-18 DIAGNOSIS — G2581 Restless legs syndrome: Secondary | ICD-10-CM | POA: Insufficient documentation

## 2023-05-18 DIAGNOSIS — Z7901 Long term (current) use of anticoagulants: Secondary | ICD-10-CM | POA: Insufficient documentation

## 2023-05-18 DIAGNOSIS — R5381 Other malaise: Secondary | ICD-10-CM | POA: Insufficient documentation

## 2023-05-18 DIAGNOSIS — Z9221 Personal history of antineoplastic chemotherapy: Secondary | ICD-10-CM | POA: Insufficient documentation

## 2023-05-18 NOTE — Progress Notes (Signed)
 [facility] OFFICE PROGRESS NOTE   Diagnosis: Gynecologic malignancy  INTERVAL HISTORY:   [name] returns as scheduled.  She feels well.  No consistent pain.  No new mass.  No difficulty with bowel or bladder function.  She sometimes has malaise.  Objective:  Vital signs in last 24 hours:  Blood pressure 122/60, pulse 62, temperature 98.1 F (36.7 C), temperature source Temporal, resp. rate 18, height 5' 6 (1.676 m), weight 135 lb 1.6 oz (61.3 kg), SpO2 100%.    Lymphatics: No inguinal or femoral nodes Resp: Lungs clear bilaterally Cardio: Regular rate and rhythm GI: No mass, no hepatosplenomegaly, no apparent ascites, nontender, small suprapubic hernias Vascular: No leg edema   Lab Results:  Lab Results  Component Value Date   WBC 6.0 12/28/2022   HGB 11.9 (L) 12/28/2022   HCT 36.7 12/28/2022   MCV 97.9 12/28/2022   PLT 249 12/28/2022   NEUTROABS 4.0 05/06/2020    CMP  Lab Results  Component Value Date   NA 142 05/25/2021   K 4.7 05/25/2021   CL 106 05/25/2021   CO2 31 05/25/2021   GLUCOSE 110 (H) 05/25/2021   BUN 21 05/25/2021   CREATININE 0.89 05/25/2021   CALCIUM 10.1 05/25/2021   PROT 6.6 05/25/2021   ALBUMIN 4.0 05/25/2021   AST 30 05/25/2021   ALT 31 05/25/2021   ALKPHOS 49 05/25/2021   BILITOT 0.3 05/25/2021   GFRNONAA >60 05/25/2021   GFRAA >60 12/25/2019    Lab Results  Component Value Date   CA125 10 03/17/2016    Lab Results  Component Value Date   INR 1.1 12/28/2022   LABPROT 14.0 12/28/2022    Imaging:  No results found.  Medications: I have reviewed the patient's current medications.   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   Elevated CA 125.   Cycle 1 Taxol /carboplatin  08/16/2013.   Cycle 2 Taxol /carboplatin  09/06/2013.   Cycle 3  Taxol /carboplatin  09/27/2013   CT 10/11/2013 with a stable abdominal wall mass. Status post resection of abdominal wall mass with abdominal wall reconstruction 11/05/2013. Final pathology showed adenocarcinoma. Specimen extensively involved with adenocarcinoma which focally involved the edge of the specimen. Immunostains and Foundation 1 testing were nonspecific for a primary tumor location. Adjuvant radiation to the abdominal wall 01/16/2014 through 02/27/2014 CT 01/15/2018 while in the emergency room for evaluation of back pain- midline abdominal wall mass above the pubic symphysis and adjacent to the bladder Ultrasound-guided biopsy of the abdominal wall mass 01/25/2018-adenocarcinoma similar to the 2015 biopsy PET scan 03/01/2018-hypermetabolism associated with a cystic and solid suprapubic mass.  Focal uptake identified in the left L3-4 facets without underlying bony lesion.  No other sites of unexpected or suspicious hypermetabolism in the neck, chest, abdomen or pelvis. Exploratory laparotomy, resection of abdominal wall tumor, oversew of bladder peritoneum, cystoscopy 03/28/2018 (findings included a cystic and solid mass encountered below the lower edge of the incision extending into the space of retzius and adherent to the pubic arch inferiorly, bladder dome posteriorly and prior abdominal wall repair anteriorly.  No nodularity palpated in the tumor bed.  Visualized bowel and omentum free of disease.  Pelvic peritoneum appeared normal).  A small amount of tumor on the periosteum was cauterized using a Bovie. Pathology on pelvic tumor-metastatic high-grade adenocarcinoma, favor clear-cell carcinoma of gynecologic origin, fragments up to 5.8 cm  in size; ER negative, PR positive, 1+ (weak staining) to focally 2+, 30-40%, MSI-stable, mutation burden-4,CREBBP alteration Cycle 1 gemcitabine /carboplatin  05/21/2018; day 8 held due to neutropenia Cycle 2 gemcitabine /carboplatin  06/11/2018 (day 8 gemcitabine   eliminated) Cycle 3 gemcitabine /carboplatin  07/06/2018 Cycle 4 gemcitabine /carboplatin  07/27/2018 (Udenyca  added) Cycle 5 gemcitabine /carboplatin  08/17/2018 Cycle 6 gemcitabine /carboplatin  09/07/2018  CT 10/19/2018- low anterior abdominal wall mass no longer identified, soft tissue thickening cephalad to the site of prior mass-nonspecific, no additional evidence of metastatic disease CT 04/18/2019-no evidence of recurrent or metastatic disease CT 12/23/2019-slight increase in size of ventral midline soft tissue mass near the pubis, no other evidence of disease progression Biopsy pelvic body wall soft tissue mass 01/06/2020-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, ER negative, HER2 (2+) positive by FISH Surgical excision of the soft tissue mass adherent to the pubis 02/06/2020-clear cell carcinoma, positive surgical margins Adjuvant radiation 03/25/2020- 04/29/2020 CT abdomen/pelvis 05/19/2020- resolution of suprapubic abdominal wall mass, no evidence of metastatic disease, moderate right and small left hernias CT abdomen/pelvis 11/26/2020-no recurrence of the lower pelvic soft tissue mass, small bilateral lower pelvic hernias, no evidence of recurrent or metastatic disease CT abdomen/pelvis 05/27/2021-no evidence of recurrent disease CT abdomen/pelvis 02/28/2022-no evidence of recurrent disease progressive bowel herniation in the anterior pelvic wall CT abdomen/pelvis 08/24/2022-no evidence of recurrent disease CT abdomen/pelvis 11/04/2022-mild increase in size of a soft tissue density at the inferior aspect of the suprapubic hernia and anterior margin of the right pubic bone PET 11/30/2022-hypermetabolic soft tissue mass at the anterior margin of the left inferior pubic ramus, hypermetabolic adjacent left inguinal node, no evidence of distant metastatic disease CT biopsy left soft tissue mass left pectineus muscle 12/28/2022-metastatic adenocarcinoma  morphologically consistent with patient's known history of clear-cell carcinoma of gynecologic origin, HRD negative, MSS, tumor mutation burden 7, CREBBP alteration Radiation to the left pubis mass 01/19/2023 - 02/01/2023, 50 Gray in 10 fractions CT abdomen/pelvis 03/27/2023-new small nodule in the right lower quadrant deep to the anterior abdominal wall, unchanged mildly enlarged left inguinal node, ill-defined mass in the medial adductor musculature on the left less well-defined  Ovarian cancer in 1988, low-grade adenocarcinoma with areas of borderline carcinoma, status post a hysterectomy and bilateral oophorectomy. New onset atrial fibrillation August 2016, maintained on eliquis  Recurrent urinary tract infections Restless legs        Disposition: [name] appears stable.  He appears asymptomatic from the gynecologic malignancy and there is no evidence of disease progression.  We will follow-up on the CA125 from today.  We discussed Enhertu therapy.  She is comfortable with observation.  She will return for an office visit and restaging CT in 2 months.  Arley Hof, MD  05/18/2023  11:01 AM

## 2023-05-19 ENCOUNTER — Encounter: Payer: Self-pay | Admitting: Cardiology

## 2023-05-19 ENCOUNTER — Ambulatory Visit: Payer: HMO | Attending: Cardiology | Admitting: Cardiology

## 2023-05-19 ENCOUNTER — Telehealth: Payer: Self-pay

## 2023-05-19 VITALS — BP 124/68 | HR 56 | Ht 66.0 in | Wt 134.4 lb

## 2023-05-19 DIAGNOSIS — D6869 Other thrombophilia: Secondary | ICD-10-CM | POA: Diagnosis not present

## 2023-05-19 DIAGNOSIS — I480 Paroxysmal atrial fibrillation: Secondary | ICD-10-CM | POA: Diagnosis not present

## 2023-05-19 DIAGNOSIS — Z6821 Body mass index (BMI) 21.0-21.9, adult: Secondary | ICD-10-CM | POA: Diagnosis not present

## 2023-05-19 DIAGNOSIS — G894 Chronic pain syndrome: Secondary | ICD-10-CM | POA: Diagnosis not present

## 2023-05-19 LAB — CA 125: Cancer Antigen (CA) 125: 20.1 U/mL (ref 0.0–38.1)

## 2023-05-19 NOTE — Telephone Encounter (Signed)
-----   Message from Thornton Papas sent at 05/19/2023  7:17 AM EST ----- Please call patient, CA 125 is normal, f/u as scheduled

## 2023-05-19 NOTE — Patient Instructions (Addendum)

## 2023-05-19 NOTE — Telephone Encounter (Signed)
 Patient gave verbal understanding had no further questions or concerns.

## 2023-05-19 NOTE — Progress Notes (Signed)
 Clinical Summary Ms. Dukes is a 78 y.o.female seen today for follow up of the following medical problems.     1. Afib - new diagnosis during 12/2014 admission, presented with palpitations and left arm pain - converted back to NSR while admitted, discharged on low dose lopressor , higher doses caused some low heart rates and soft bp's.   - echo LVEF 55-60%, mod LAE.      - no recent palpitations - compliant with meds. No bleeding on eliquis     2. Metatstic Adenocardinoma Ovarian - followed by oncology - currnetly in remission     SH: works as architect, just recentlty retired. Past Medical History:  Diagnosis Date   Anxiety    new dx   Atrial fibrillation (HCC)    Cancer (HCC) 1988, 2015   ovarian, adenocarcinoma   GERD (gastroesophageal reflux disease)    hx of years ago   Hx of radiation therapy 01/16/14-02/27/14   abdomen    Hypercholesteremia    under control with diet and fish oil   Hypertension    Ovarian cyst    PONV (postoperative nausea and vomiting)    Restless leg syndrome    Thyroid  disease      No Known Allergies   Current Outpatient Medications  Medication Sig Dispense Refill   ALPRAZolam  (XANAX ) 0.5 MG tablet Take 0.5 mg by mouth at bedtime.     apixaban  (ELIQUIS ) 5 MG TABS tablet TAKE (1) TABLET BY MOUTH TWICE DAILY. 180 tablet 1   cholecalciferol (VITAMIN D3) 25 MCG (1000 UNIT) tablet Take 1,000 Units by mouth daily.     conjugated estrogens  (PREMARIN ) vaginal cream Finger tip amount of cream, applied to vulva and outer vagina at night 3 x a week 42.5 g 6   HYDROcodone -acetaminophen  (NORCO/VICODIN) 5-325 MG tablet Take 1 tablet by mouth 3 (three) times daily as needed.     levothyroxine  (SYNTHROID ) 50 MCG tablet Take 50 mcg by mouth daily.     metoprolol  tartrate (LOPRESSOR ) 25 MG tablet TAKE (1/2) TABLET BY MOUTH TWICE DAILY. 90 tablet 3   Multiple Vitamin (MULTIVITAMIN WITH MINERALS) TABS tablet Take 1 tablet by mouth daily.      pramipexole (MIRAPEX) 0.125 MG tablet Take 0.125 mg by mouth 2 (two) times daily.     No current facility-administered medications for this visit.     Past Surgical History:  Procedure Laterality Date   APPENDECTOMY     APPLICATION OF A-CELL OF CHEST/ABDOMEN N/A 11/05/2013   Procedure: ABDOMINAL WALL RESCONTRUCTION WITH STRATUS;  Surgeon: Earlis Ranks, MD;  Location: [facility];  Service: Plastics;  Laterality: N/A;   CHOLECYSTECTOMY  2009   LAPAROTOMY N/A 11/05/2013   Procedure: RESECTION OF PELVIC MASS;  Surgeon: Elenore A. Dodie, MD;  Location: [facility];  Service: Gynecology;  Laterality: N/A;   LAPAROTOMY  03/2018   excision of suprapubic mass at [facility]   OOPHORECTOMY     BSO   TONSILLECTOMY  as child   TOTAL ABDOMINAL HYSTERECTOMY  1988   ovarian cyst  BSO     No Known Allergies    Family History  Problem Relation Age of Onset   Heart disease Mother    Heart disease Sister    Diabetes Sister    Cancer Sister        melanoma-skin cancer   Breast cancer Paternal Grandmother        Age 58's   Cancer Paternal Grandmother  breast   Cancer Paternal Grandfather        prostate/bladder   Colon cancer Neg Hx    Ovarian cancer Neg Hx    Endometrial cancer Neg Hx    Pancreatic cancer Neg Hx    Prostate cancer Neg Hx      Social History Ms. Ponder reports that she has never smoked. She has never used smokeless tobacco. Ms. Kathman reports no history of alcohol use.   Review of Systems CONSTITUTIONAL: No weight loss, fever, chills, weakness or fatigue.  HEENT: Eyes: No visual loss, blurred vision, double vision or yellow sclerae.No hearing loss, sneezing, congestion, runny nose or sore throat.  SKIN: No rash or itching.  CARDIOVASCULAR: per hpi RESPIRATORY: No shortness of breath, cough or sputum.  GASTROINTESTINAL: No anorexia, nausea, vomiting or diarrhea. No abdominal pain or blood.  GENITOURINARY: No burning on urination, no polyuria NEUROLOGICAL: No headache,  dizziness, syncope, paralysis, ataxia, numbness or tingling in the extremities. No change in bowel or bladder control.  MUSCULOSKELETAL: No muscle, back pain, joint pain or stiffness.  LYMPHATICS: No enlarged nodes. No history of splenectomy.  PSYCHIATRIC: No history of depression or anxiety.  ENDOCRINOLOGIC: No reports of sweating, cold or heat intolerance. No polyuria or polydipsia.  SABRA   Physical Examination Today's Vitals   05/19/23 1127  BP: 124/68  Pulse: (!) 56  SpO2: 99%  Weight: 134 lb 6.4 oz (61 kg)  Height: 5' 6 (1.676 m)   Body mass index is 21.69 kg/m.  Gen: resting comfortably, no acute distress HEENT: no scleral icterus, pupils equal round and reactive, no palptable cervical adenopathy,  CV: RRR, no m/rg, no jvd Resp: Clear to auscultation bilaterally GI: abdomen is soft, non-tender, non-distended, normal bowel sounds, no hepatosplenomegaly MSK: extremities are warm, no edema.  Skin: warm, no rash Neuro:  no focal deficits Psych: appropriate affect   Diagnostic Studies 12/2014 echo Study Conclusions  - Left ventricle: The cavity size was normal. Wall thickness was   normal. Systolic function was normal. The estimated ejection   fraction was in the range of 55% to 60%. Wall motion was normal;   there were no regional wall motion abnormalities. Left   ventricular diastolic function parameters were normal. - Mitral valve: Mildly calcified annulus. There was mild   regurgitation. - Left atrium: The atrium was moderately dilated. Volume/bsa, S:   37.9 ml/m^2. - Right atrium: The atrium was mildly to moderately dilated. - Tricuspid valve: There was mild-moderate regurgitation.      Assessment and Plan   1. PAF/acquired thrombophilia  - no recent sympstoms - careful dosing of lopressor  as she has mild sinus bradycardia  - continue eliquis  for stroke prevention - EKG today shows sinus brady at 56     Dorn PHEBE Ross, M.D.

## 2023-05-22 ENCOUNTER — Ambulatory Visit: Payer: PPO

## 2023-05-23 ENCOUNTER — Ambulatory Visit
Admission: RE | Admit: 2023-05-23 | Discharge: 2023-05-23 | Disposition: A | Discharge status: Home / Self Care | Payer: PPO | Source: Ambulatory Visit | Attending: Family Medicine | Admitting: Family Medicine

## 2023-05-23 DIAGNOSIS — Z1231 Encounter for screening mammogram for malignant neoplasm of breast: Secondary | ICD-10-CM | POA: Diagnosis not present

## 2023-06-12 ENCOUNTER — Ambulatory Visit: Payer: HMO | Attending: Radiation Oncology

## 2023-06-12 VITALS — Wt 133.2 lb

## 2023-06-12 DIAGNOSIS — C561 Malignant neoplasm of right ovary: Secondary | ICD-10-CM | POA: Insufficient documentation

## 2023-06-12 NOTE — Therapy (Signed)
OUTPATIENT PHYSICAL THERAPY SOZO SCREENING NOTE   Patient Name: [name] MRN: [id number] DOB:1946-05-04, 78 y.o., female Today's Date: 06/12/2023  PCP: Assunta Found, MD REFERRING PROVIDER: Ronny Bacon,*   PT End of Session - 06/12/23 1032     Visit Number 1   # unchanged due to screen only   PT Start Time 1030    PT Stop Time 1034    PT Time Calculation (min) 4 min    Activity Tolerance Patient tolerated treatment well    Behavior During Therapy [facility] for tasks assessed/performed             Past Medical History:  Diagnosis Date   Anxiety    new dx   Atrial fibrillation (HCC)    Cancer (HCC) 1988, 2015   ovarian, adenocarcinoma   GERD (gastroesophageal reflux disease)    hx of years ago   Hx of radiation therapy 01/16/14-02/27/14   abdomen    Hypercholesteremia    under control with diet and fish oil   Hypertension    Ovarian cyst    PONV (postoperative nausea and vomiting)    Restless leg syndrome    Thyroid disease    Past Surgical History:  Procedure Laterality Date   APPENDECTOMY     APPLICATION OF A-CELL OF CHEST/ABDOMEN N/A 11/05/2013   Procedure: ABDOMINAL WALL RESCONTRUCTION WITH STRATUS;  Surgeon: Glenna Fellows, MD;  Location: [facility];  Service: Plastics;  Laterality: N/A;   CHOLECYSTECTOMY  2009   LAPAROTOMY N/A 11/05/2013   Procedure: RESECTION OF PELVIC MASS;  Surgeon: Rejeana Brock A. Duard Brady, MD;  Location: [facility];  Service: Gynecology;  Laterality: N/A;   LAPAROTOMY  03/2018   excision of suprapubic mass at [facility]   OOPHORECTOMY     BSO   TONSILLECTOMY  as child   TOTAL ABDOMINAL HYSTERECTOMY  1988   ovarian cyst  BSO   Patient Active Problem List   Diagnosis Date Noted   Incisional hernia, without obstruction or gangrene 08/30/2021   Genetic testing 04/06/2020   Hypothyroidism 02/03/2020   Restless leg syndrome 02/03/2020   Atrial fibrillation (HCC) 01/13/2015   Chest pain 01/13/2015   Ovarian ca (HCC) 11/05/2013   Metastatic  adenocarcinoma of ovary 08/20/2013   Abdominal wall mass of suprapubic region 08/20/2013   Adenocarcinoma of abdominal wall, unclear primary 08/09/2013   Neoplasm of abdominal wall of uncertain behavior 07/23/2013    REFERRING DIAG: right breast cancer at risk for lymphedema  THERAPY DIAG:  Malignant neoplasm of right ovary (HCC)  PERTINENT HISTORY: Diagnosed 1988 at time of hysterectomy with carcinoma of left ovary. In 2015 diagnosed with abdominal wall reoccurence in right rectus muscle and she had 3 rounds of chemo with no response. 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  She received carboplatin and Gemzar therapy. 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 She went on to receive palliative radiotherapy which she completed in December 2021 to  the tumor within and involving the pelvis. A PET on 11/30/22 showed hypermetabolic activity in the anterior margin of the left inferior pubic ramus and left inguinal node.  Dr. Mitzi Hansen would like to biopsy a groin LN to be sure it is recurrent disease, and if so 2-5 weeks of radiation.She's seen to consider additional radiotherapy to the pelvis and left groin region.   PRECAUTIONS: right UE Lymphedema risk, None  SUBJECTIVE: Pt returns for her first 3 month L-Dex screen.   PAIN:  Are you having pain? No  SOZO  SCREENING: Patient was assessed today using the SOZO machine to determine the lymphedema index score. This was compared to her baseline score. It was determined that she is within the recommended range when compared to her baseline and no further action is needed at this time. She will continue SOZO screenings. These are done every 3 months for 2 years post operatively followed by every 6 months for 2 years, and then annually.   L-DEX FLOWSHEETS - 06/12/23 1000       L-DEX LYMPHEDEMA SCREENING   Measurement Type Unilateral    L-DEX MEASUREMENT EXTREMITY Lower Extremity    POSITION  Standing    DOMINANT SIDE Right    At Risk Side Right    BASELINE SCORE (UNILATERAL) -7.1               Hermenia Bers, PTA 06/12/2023, 10:36 AM

## 2023-06-20 DIAGNOSIS — Z7189 Other specified counseling: Secondary | ICD-10-CM | POA: Diagnosis not present

## 2023-06-20 DIAGNOSIS — L814 Other melanin hyperpigmentation: Secondary | ICD-10-CM | POA: Diagnosis not present

## 2023-06-20 DIAGNOSIS — D225 Melanocytic nevi of trunk: Secondary | ICD-10-CM | POA: Diagnosis not present

## 2023-06-20 DIAGNOSIS — D492 Neoplasm of unspecified behavior of bone, soft tissue, and skin: Secondary | ICD-10-CM | POA: Diagnosis not present

## 2023-06-20 DIAGNOSIS — D0439 Carcinoma in situ of skin of other parts of face: Secondary | ICD-10-CM | POA: Diagnosis not present

## 2023-06-20 DIAGNOSIS — L738 Other specified follicular disorders: Secondary | ICD-10-CM | POA: Diagnosis not present

## 2023-06-20 DIAGNOSIS — L821 Other seborrheic keratosis: Secondary | ICD-10-CM | POA: Diagnosis not present

## 2023-07-04 ENCOUNTER — Inpatient Hospital Stay: Payer: HMO | Attending: Gynecologic Oncology

## 2023-07-04 ENCOUNTER — Telehealth: Payer: Self-pay

## 2023-07-04 ENCOUNTER — Ambulatory Visit (HOSPITAL_BASED_OUTPATIENT_CLINIC_OR_DEPARTMENT_OTHER)
Admission: RE | Admit: 2023-07-04 | Discharge: 2023-07-04 | Disposition: A | Discharge status: Home / Self Care | Payer: HMO | Source: Ambulatory Visit | Attending: Oncology | Admitting: Oncology

## 2023-07-04 ENCOUNTER — Other Ambulatory Visit: Payer: Self-pay

## 2023-07-04 DIAGNOSIS — Z9221 Personal history of antineoplastic chemotherapy: Secondary | ICD-10-CM | POA: Diagnosis not present

## 2023-07-04 DIAGNOSIS — Z9079 Acquired absence of other genital organ(s): Secondary | ICD-10-CM | POA: Diagnosis not present

## 2023-07-04 DIAGNOSIS — Z923 Personal history of irradiation: Secondary | ICD-10-CM | POA: Insufficient documentation

## 2023-07-04 DIAGNOSIS — Z8543 Personal history of malignant neoplasm of ovary: Secondary | ICD-10-CM | POA: Insufficient documentation

## 2023-07-04 DIAGNOSIS — Z90722 Acquired absence of ovaries, bilateral: Secondary | ICD-10-CM | POA: Insufficient documentation

## 2023-07-04 DIAGNOSIS — C569 Malignant neoplasm of unspecified ovary: Secondary | ICD-10-CM | POA: Insufficient documentation

## 2023-07-04 DIAGNOSIS — R590 Localized enlarged lymph nodes: Secondary | ICD-10-CM | POA: Diagnosis not present

## 2023-07-04 DIAGNOSIS — R1909 Other intra-abdominal and pelvic swelling, mass and lump: Secondary | ICD-10-CM | POA: Diagnosis not present

## 2023-07-04 DIAGNOSIS — Z90710 Acquired absence of both cervix and uterus: Secondary | ICD-10-CM | POA: Insufficient documentation

## 2023-07-04 LAB — BASIC METABOLIC PANEL - CANCER CENTER ONLY
Anion gap: 5 (ref 5–15)
BUN: 21 mg/dL (ref 8–23)
CO2: 31 mmol/L (ref 22–32)
Calcium: 10.4 mg/dL — ABNORMAL HIGH (ref 8.9–10.3)
Chloride: 103 mmol/L (ref 98–111)
Creatinine: 0.89 mg/dL (ref 0.44–1.00)
GFR, Estimated: >60 mL/min (ref >60)
Glucose, Bld: 109 mg/dL — ABNORMAL HIGH (ref 70–99)
Potassium: 5.4 mmol/L — ABNORMAL HIGH (ref 3.5–5.1)
Sodium: 139 mmol/L (ref 135–145)

## 2023-07-04 MED ORDER — IOHEXOL 300 MG/ML  SOLN
100.0000 mL | Freq: Once | INTRAMUSCULAR | Status: AC | PRN
  Administered 2023-07-04: 100 mL via INTRAVENOUS

## 2023-07-04 NOTE — Telephone Encounter (Signed)
-----   Message from Thornton Papas sent at 07/04/2023  1:52 PM EST ----- Please call patient, the potassium returned mildly elevated, repeat BMP 1 week

## 2023-07-04 NOTE — Telephone Encounter (Signed)
Spoke with pt regarding lab result per MD Truett Perna. Pt verbalizes understanding that the [facility] scheduling department will call her to set up lab appt.

## 2023-07-05 LAB — CA 125: Cancer Antigen (CA) 125: 19 U/mL (ref 0.0–38.1)

## 2023-07-11 ENCOUNTER — Inpatient Hospital Stay: Payer: HMO

## 2023-07-11 DIAGNOSIS — Z8543 Personal history of malignant neoplasm of ovary: Secondary | ICD-10-CM | POA: Diagnosis not present

## 2023-07-11 DIAGNOSIS — C569 Malignant neoplasm of unspecified ovary: Secondary | ICD-10-CM

## 2023-07-11 LAB — BASIC METABOLIC PANEL - CANCER CENTER ONLY
Anion gap: 5 (ref 5–15)
BUN: 20 mg/dL (ref 8–23)
CO2: 30 mmol/L (ref 22–32)
Calcium: 10.2 mg/dL (ref 8.9–10.3)
Chloride: 103 mmol/L (ref 98–111)
Creatinine: 0.76 mg/dL (ref 0.44–1.00)
GFR, Estimated: >60 mL/min (ref >60)
Glucose, Bld: 86 mg/dL (ref 70–99)
Potassium: 4.4 mmol/L (ref 3.5–5.1)
Sodium: 138 mmol/L (ref 135–145)

## 2023-07-12 ENCOUNTER — Telehealth: Payer: Self-pay | Admitting: *Deleted

## 2023-07-12 NOTE — Telephone Encounter (Signed)
 Notified of normal K+ and Ca+ results and marker is 19.0

## 2023-07-12 NOTE — Telephone Encounter (Signed)
-----   Message from Thornton Papas sent at 07/11/2023  3:22 PM EST ----- Please call patient the potassium and calcium are normal today, follow-up as scheduled

## 2023-07-19 DIAGNOSIS — E039 Hypothyroidism, unspecified: Secondary | ICD-10-CM | POA: Diagnosis not present

## 2023-07-19 DIAGNOSIS — E7849 Other hyperlipidemia: Secondary | ICD-10-CM | POA: Diagnosis not present

## 2023-07-19 DIAGNOSIS — I4891 Unspecified atrial fibrillation: Secondary | ICD-10-CM | POA: Diagnosis not present

## 2023-07-19 DIAGNOSIS — F419 Anxiety disorder, unspecified: Secondary | ICD-10-CM | POA: Diagnosis not present

## 2023-07-19 DIAGNOSIS — C482 Malignant neoplasm of peritoneum, unspecified: Secondary | ICD-10-CM | POA: Diagnosis not present

## 2023-07-19 DIAGNOSIS — Z1331 Encounter for screening for depression: Secondary | ICD-10-CM | POA: Diagnosis not present

## 2023-07-19 DIAGNOSIS — E782 Mixed hyperlipidemia: Secondary | ICD-10-CM | POA: Diagnosis not present

## 2023-07-19 DIAGNOSIS — Z6820 Body mass index (BMI) 20.0-20.9, adult: Secondary | ICD-10-CM | POA: Diagnosis not present

## 2023-07-19 DIAGNOSIS — Z0001 Encounter for general adult medical examination with abnormal findings: Secondary | ICD-10-CM | POA: Diagnosis not present

## 2023-07-19 DIAGNOSIS — G4700 Insomnia, unspecified: Secondary | ICD-10-CM | POA: Diagnosis not present

## 2023-07-20 ENCOUNTER — Inpatient Hospital Stay: Payer: PPO | Attending: Gynecologic Oncology | Admitting: Oncology

## 2023-07-20 VITALS — BP 127/58 | HR 60 | Temp 98.1°F | Resp 18 | Ht 66.0 in | Wt 137.6 lb

## 2023-07-20 DIAGNOSIS — Z90722 Acquired absence of ovaries, bilateral: Secondary | ICD-10-CM | POA: Insufficient documentation

## 2023-07-20 DIAGNOSIS — Z923 Personal history of irradiation: Secondary | ICD-10-CM | POA: Insufficient documentation

## 2023-07-20 DIAGNOSIS — G2581 Restless legs syndrome: Secondary | ICD-10-CM | POA: Diagnosis not present

## 2023-07-20 DIAGNOSIS — Z9079 Acquired absence of other genital organ(s): Secondary | ICD-10-CM | POA: Diagnosis not present

## 2023-07-20 DIAGNOSIS — Z9221 Personal history of antineoplastic chemotherapy: Secondary | ICD-10-CM | POA: Insufficient documentation

## 2023-07-20 DIAGNOSIS — Z90710 Acquired absence of both cervix and uterus: Secondary | ICD-10-CM | POA: Diagnosis not present

## 2023-07-20 DIAGNOSIS — Z87440 Personal history of urinary (tract) infections: Secondary | ICD-10-CM | POA: Diagnosis not present

## 2023-07-20 DIAGNOSIS — L539 Erythematous condition, unspecified: Secondary | ICD-10-CM | POA: Insufficient documentation

## 2023-07-20 DIAGNOSIS — C569 Malignant neoplasm of unspecified ovary: Secondary | ICD-10-CM

## 2023-07-20 DIAGNOSIS — N952 Postmenopausal atrophic vaginitis: Secondary | ICD-10-CM | POA: Diagnosis not present

## 2023-07-20 DIAGNOSIS — Z8543 Personal history of malignant neoplasm of ovary: Secondary | ICD-10-CM | POA: Diagnosis not present

## 2023-07-20 DIAGNOSIS — Z7901 Long term (current) use of anticoagulants: Secondary | ICD-10-CM | POA: Diagnosis not present

## 2023-07-20 DIAGNOSIS — I4891 Unspecified atrial fibrillation: Secondary | ICD-10-CM | POA: Insufficient documentation

## 2023-07-20 NOTE — Progress Notes (Signed)
 [facility] OFFICE PROGRESS NOTE   Diagnosis: Gynecologic cancer  INTERVAL HISTORY:   Ms. Wunschel returns as scheduled.  She feels well.  She has occasional low abdomen/suprapubic discomfort.  She is not taking pain medication.  She recently completed a course of TobraDex for erythema in the left eye.  Good appetite.  Objective:  Vital signs in last 24 hours:  Blood pressure (!) 127/58, pulse 60, temperature 98.1 F (36.7 C), temperature source Temporal, resp. rate 18, height 5\' 6"  (1.676 m), weight 137 lb 9.6 oz (62.4 kg), SpO2 100%.    Lymphatics: No cervical, supraclavicular, axillary, or inguinal nodes Resp: Lungs clear bilaterally Cardio: Regular rate and rhythm GI: No hepatosplenomegaly, no mass, nontender, no apparent ascites, reducible suprapubic hernias Vascular: No leg edema     Lab Results:  Lab Results  Component Value Date   WBC 6.0 12/28/2022   HGB 11.9 (L) 12/28/2022   HCT 36.7 12/28/2022   MCV 97.9 12/28/2022   PLT 249 12/28/2022   NEUTROABS 4.0 05/06/2020    CMP  Lab Results  Component Value Date   NA 138 07/11/2023   K 4.4 07/11/2023   CL 103 07/11/2023   CO2 30 07/11/2023   GLUCOSE 86 07/11/2023   BUN 20 07/11/2023   CREATININE 0.76 07/11/2023   CALCIUM 10.2 07/11/2023   PROT 6.6 05/25/2021   ALBUMIN 4.0 05/25/2021   AST 30 05/25/2021   ALT 31 05/25/2021   ALKPHOS 49 05/25/2021   BILITOT 0.3 05/25/2021   GFRNONAA >60 07/11/2023   GFRAA >60 12/25/2019    Lab Results  Component Value Date   CA125 10 03/17/2016    Lab Results  Component Value Date   INR 1.1 12/28/2022   LABPROT 14.0 12/28/2022    Imaging:  No results found.  Medications: I have reviewed the patient's current medications.   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   Elevated CA 125.    Cycle 1 Taxol/carboplatin 08/16/2013.   Cycle 2 Taxol/carboplatin 09/06/2013.   Cycle 3 Taxol/carboplatin 09/27/2013   CT 10/11/2013 with a stable abdominal wall mass. Status post resection of abdominal wall mass with abdominal wall reconstruction 11/05/2013. Final pathology showed adenocarcinoma. Specimen extensively involved with adenocarcinoma which focally involved the edge of the specimen. Immunostains and Foundation 1 testing were nonspecific for a primary tumor location. Adjuvant radiation to the abdominal wall 01/16/2014 through 02/27/2014 CT 01/15/2018 while in the emergency room for evaluation of back pain- midline abdominal wall mass above the pubic symphysis and adjacent to the bladder Ultrasound-guided biopsy of the abdominal wall mass 01/25/2018-adenocarcinoma similar to the 2015 biopsy PET scan 03/01/2018-hypermetabolism associated with a cystic and solid suprapubic mass.  Focal uptake identified in the left L3-4 facets without underlying bony lesion.  No other sites of unexpected or suspicious hypermetabolism in the neck, chest, abdomen or pelvis. Exploratory laparotomy, resection of abdominal wall tumor, oversew of bladder peritoneum, cystoscopy 03/28/2018 (findings included a cystic and solid mass encountered below the lower edge of the incision extending into the space of retzius and adherent to the pubic arch inferiorly, bladder dome posteriorly and prior abdominal wall repair anteriorly.  No nodularity palpated in the tumor bed.  Visualized bowel and omentum free of disease.  Pelvic peritoneum appeared normal).  A "small "amount of tumor on the periosteum was cauterized using a Bovie. Pathology on  pelvic tumor-metastatic high-grade adenocarcinoma, favor clear-cell carcinoma of gynecologic origin, fragments up to 5.8 cm in size; ER negative, PR positive, 1+ (weak staining) to focally 2+, 30-40%, MSI-stable, mutation burden-4,CREBBP alteration Cycle 1 gemcitabine/carboplatin 05/21/2018;  day 8 held due to neutropenia Cycle 2 gemcitabine/carboplatin 06/11/2018 (day 8 gemcitabine eliminated) Cycle 3 gemcitabine/carboplatin 07/06/2018 Cycle 4 gemcitabine/carboplatin 07/27/2018 (Udenyca added) Cycle 5 gemcitabine/carboplatin 08/17/2018 Cycle 6 gemcitabine/carboplatin 09/07/2018  CT 10/19/2018- low anterior abdominal wall mass no longer identified, soft tissue thickening cephalad to the site of prior mass-nonspecific, no additional evidence of metastatic disease CT 04/18/2019-no evidence of recurrent or metastatic disease CT 12/23/2019-slight increase in size of ventral midline soft tissue mass near the pubis, no other evidence of disease progression Biopsy pelvic body wall soft tissue mass 01/06/2020-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, ER negative, HER2 (2+) positive by [facility] Surgical excision of the soft tissue mass adherent to the pubis 02/06/2020-clear cell carcinoma, positive surgical margins Adjuvant radiation 03/25/2020- 04/29/2020 CT abdomen/pelvis 05/19/2020- resolution of suprapubic abdominal wall mass, no evidence of metastatic disease, moderate right and small left hernias CT abdomen/pelvis 11/26/2020-no recurrence of the lower pelvic soft tissue mass, small bilateral lower pelvic hernias, no evidence of recurrent or metastatic disease CT abdomen/pelvis 05/27/2021-no evidence of recurrent disease CT abdomen/pelvis 02/28/2022-no evidence of recurrent disease progressive bowel herniation in the anterior pelvic wall CT abdomen/pelvis 08/24/2022-no evidence of recurrent disease CT abdomen/pelvis 11/04/2022-mild increase in size of a soft tissue density at the inferior aspect of the suprapubic hernia and anterior margin of the right pubic bone PET 11/30/2022-hypermetabolic soft tissue mass at the anterior margin of the left inferior pubic ramus, hypermetabolic adjacent left inguinal node, no evidence of distant metastatic disease CT biopsy  left soft tissue mass left pectineus muscle 12/28/2022-metastatic adenocarcinoma morphologically consistent with patient's known history of clear-cell carcinoma of gynecologic origin, HRD negative, MSS, tumor mutation burden 7, CREBBP alteration Radiation to the left pubis mass 01/19/2023 - 02/01/2023, 50 Gray in 10 fractions CT abdomen/pelvis 03/27/2023-new small nodule in the right lower quadrant deep to the anterior abdominal wall, unchanged mildly enlarged left inguinal node, ill-defined mass in the medial adductor musculature on the left less well-defined CT abdomen/pelvis 07/04/2023: Stable pelvic soft tissue nodules, decreased left pelvic adenopathy and left abductor metastasis, no new site of metastatic disease, suprapubic hernia  Ovarian cancer in 1988, low-grade adenocarcinoma with areas of "borderline" carcinoma, status post a hysterectomy and bilateral oophorectomy. New onset atrial fibrillation August 2016, maintained on eliquis Recurrent urinary tract infections Restless legs      Disposition: [name] appears stable.  The CA125 is normal.  The restaging CT reveals no evidence of disease progression.  Her only symptom related to the metastatic gynecologic malignancy is intermittent mild pelvic discomfort.  Discussed Enhertu therapy.  She would like to continue observation.  She will return for an office visit and CA125 in 3 months.  We will plan for a restaging CT at a 36-month interval.  Thornton Papas, MD  07/20/2023  9:14 AM

## 2023-08-08 DIAGNOSIS — D0439 Carcinoma in situ of skin of other parts of face: Secondary | ICD-10-CM | POA: Diagnosis not present

## 2023-08-10 ENCOUNTER — Other Ambulatory Visit: Payer: Self-pay | Admitting: Gynecologic Oncology

## 2023-08-10 DIAGNOSIS — C801 Malignant (primary) neoplasm, unspecified: Secondary | ICD-10-CM

## 2023-08-11 ENCOUNTER — Inpatient Hospital Stay: Payer: PPO

## 2023-08-11 ENCOUNTER — Encounter: Payer: Self-pay | Admitting: Gynecologic Oncology

## 2023-08-11 ENCOUNTER — Inpatient Hospital Stay: Payer: PPO | Admitting: Gynecologic Oncology

## 2023-08-11 VITALS — BP 114/57 | HR 64 | Temp 97.8°F | Resp 18 | Wt 134.0 lb

## 2023-08-11 DIAGNOSIS — C801 Malignant (primary) neoplasm, unspecified: Secondary | ICD-10-CM

## 2023-08-11 DIAGNOSIS — Z8543 Personal history of malignant neoplasm of ovary: Secondary | ICD-10-CM

## 2023-08-11 DIAGNOSIS — N952 Postmenopausal atrophic vaginitis: Secondary | ICD-10-CM | POA: Diagnosis not present

## 2023-08-11 MED ORDER — ESTROGENS CONJUGATED 0.625 MG/GM VA CREA: TOPICAL_CREAM | VAGINAL | 6 refills | Status: DC

## 2023-08-11 NOTE — Patient Instructions (Signed)
It was good to see you today.    I will see you for follow-up in 4 months.  As always, if you develop any new and concerning symptoms before your next visit, please call to see me sooner.

## 2023-08-11 NOTE — Progress Notes (Signed)
 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 Pathology report says "focal well differentiated serous cystadenocarcinoma". The comment is that the adenocarcinomatous component is limited to rare foci of small invasive glandular structures. Another report states that there is a small focus of papillary adenocarcinoma in a serous cystadenofibroma of the left ovary. Uterus, cervix, left fallopian tube, right adnexa and pelvic washings all without evidence of malignancy. Appendix also removed at the time of surgery.  Her CA-125 at that time was reported to be 33. She did not receive adjuvant therapy for her initial cancer diagnosis.  She presented in 07/2013 with abdominal pain and mass was noted along right inferior rectus muscle. Biopsy of the mass showed adenocarcinoma of unknown primary. She received 3 cycles of neoadjuvant chemotherapy (no response), carboplatin/taxol. After resection, she underwent RT due to positie tumor margin. This was completed in 02/2014.  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 Lower abdominal/suprapubic ventral wall lobulated soft tissue  mass, arising within the lower rectus musculature more so on the  right. 5.5 x 7.9 x 9.7 cm. Favor sarcoma or other connective tissue  tumor. This should be amenable to percutaneous biopsy. In a younger  female patient, endometriosis within a Cesarean section scar would  also be a consideration for this appearance.  2. Narrow fat plane between the mass in #1 and  underlying small  bowel and bladder. No lymphadenopathy. No ascites.  3. Surgically absent gallbladder, uterus, and adnexa. Diverticulosis  of the colon.  Study discussed by telephone with PA BENJAMIN MANN on 07/19/2013 at  11:35 .    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 Microscopic Comment Needle core biopsies demonstrate diffuse involvement by well differentiated adenocarcinoma. The adenocarcinoma has the following immunophenotype: Cytokeratin 7 - strong diffuse expression Cytokeratin 20 - negative expression CDX2 - negative expression TTF-1 - negative expression Estrogen receptor - negative expression Vimentin - patchy mild to moderate expression   08/16/2013 - 09/27/2013 Chemotherapy   3 cycle of carboplatin, paclitaxel    10/11/2013 Imaging   CT A/P: A heterogeneous mass centered in the inferior right rectus abdominus  muscle measures 5.3 x 7.5 cm (previously 5.5 x 7.9 cm). No  pathologically enlarged lymph nodes. Scattered atherosclerotic  calcification of the arterial vasculature without abdominal aortic  aneurysm. No free fluid. No worrisome lytic or sclerotic lesions.  Degenerative changes are seen in the spine.   IMPRESSION:  Right rectus abdominus mass is stable to very minimally smaller than  on 07/19/2013.   11/05/2013 Surgery   Resection abdominal wall mass, abdominal wall reconstruction with Strattice matrix (10x11cm)  Operative findings: 8 involving the subcutaneous tissues, fascia, and rectus on the right side. No obvious intraperitoneal disease. Normal-appearing omentum   11/05/2013 Pathology Results   Soft tissue mass, simple excision, abdominal wall mass ADENOCARCINOMA. Microscopic Comment The specimen is extensively involved by adenocarcinoma which focally involves the  edge of the specimen. Immunohistochemistry is  performed and the tumor shows patchy positivity with Napsin-A and is negative with WT-1, estrogen receptor, progesterone receptor, gross disease cystic fluid protein, CDX-2, carcinoembryonic antigen, thyroid transcription factor-1, CD10 and CD117. The immunophenotype is nonspecific and additional immunohistochemistry will be performed and reported as an addendum. (JDP:kh 11/07/13) ADDENDUM: Additional immunohistochemistry is performed and the tumor is positive with cytokeratin AE1/AE3 and cytokeratin 7 and is negative with Calretinin, cytokeratin 5/6 and cytokeratin 20. The immunoreactivity is not specific for location of a primary tumor. (JDP:kh 11-08-13)   11/05/2013 Genetic Testing   Foundation One testing MYC amplification - equivocal CREBBP [id number]* No FDA approved therapies in patient's tumor type or another tumor type    01/16/2014 - 02/27/2014 Radiation Therapy   45 gray to the target region within the abdominal wall postoperative area. She received 45 gray using a 4 field 3-D conformal technique at 1.8 gray per fraction. The patient then received a boost using a cone down 4 field technique for an additional 9 Gray. The final dose was 54 gray.   01/29/2018 Pathology Results   Soft Tissue Needle Core Biopsy, ant abd wall mass - ADENOCARCINOMA. - SEE MICROSCOPIC DESCRIPTION. Microscopic Comment The core biopsies consist of abundant dense, hyalinized fibrous tissue with scattered glands with cytologic atypia consistent with adenocarcinoma. Immunohistochemistry shows the tumor is positive with cytokeratin AE1/AE3 and cytokeratin 7 and is negative with cytokeratin 5/6, Calretinin, WT-1, estrogen receptor, progesterone receptor and cytokeratin 20. The morphology and immunophenotype are similar to the morphology in the abdominal wall mass excision from 11/07/2013 [id number]).   03/01/2018 Imaging   PET: 1. Hypermetabolism associated with the cystic and  solid suprapubic mass compatible with the reported clinical history of adenocarcinoma recurrence. Cystic components in the cranial aspect of the lesion are largely devoid of FDG accumulation with the most hypermetabolic tissue being inferiorly, immediately adjacent to the symphysis pubis. 2. Focal uptake identified in the left L3-4 facets without underlying bony lesion. This is presumed to represent uptake related to degenerative change. 3. No other sites of unexpected or suspicious hypermetabolism in the neck, chest, abdomen, or pelvis   03/02/2018 Imaging   CT A/P: 6.7 cm mass in the lower anterior abdominal wall containing cystic and solid components, suspicious for recurrence of previously resected/treated adenocarcinoma in this region. This abuts and could invade the anterior wall of the bladder.   No evidence for more distant metastatic disease within the abdomen or pelvis.    03/28/2018 Surgery   Exploratory laparotomy, resection abdominal wall tumor, oversew of bladder peritoneum, cystoscopy.  Findings: normal external genitalia, unable to palpate mass on exam. After opening the fascia, a cystic and solid mass was encountered below the lower edge of the incision, extending into the space of retzius and adherent to the pubic arch inferiorly, the bladder dome posteriorly, and prior abdominal wall repair anteriorly. At conclusion of the case, no nodularity was palpated in the tumor bed. Visualized bowel and omentum were free of disease. Pelvic peritoenum appeared normal. Cystoscopy revealed intact bladder mucosa with no visible suture. Bilateral ureteral orfices visualized.   03/28/2018 Pathology Results   Pelvic tumor, excision - Metastatic high grade adenocarcinoma, favor clear cell carcinoma of gynecologic origin, fragments up to 5.8 cm in size - See comment In the prior biopsy specimen [id number], the malignant glands were positive for AE1/AE3 and CK7, and negative for CK20,  CK5/6, calretinin, WT-1, ER, and PR. Additional immunohistochemical studies performed on block A5 in the current specimen demonstrates that the tumor is positive  for Napsin A and PAX-8, and is negative for TTF-1. 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   03/28/2018 Genetic Testing   Foundation One No reportable alterations with companion diagnostic claims MS-stable, TMB - 4 Muts/Mb, CREBBP [id number]*   05/21/2018 - 09/08/2018 Chemotherapy   Patient is on Treatment Plan : OVARIAN Carboplatin/Gemcitabine q21d      Pathology Results   A. SOFT TISSUE MASS, PELVIC BODY WALL, NEEDLE CORE BIOPSY:  - Metastatic adenocarcinoma, see comment.   COMMENT:   The tumor has malignant cells with cleared cytoplasm.  Immunohistochemistry is positive for cytokeratin 7, PAX8, racemase, and NapsinA. The cells are negative for ER, WT-1, and CD10. 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 Dr. Truett Perna was notified on 01/08/20.    10/19/2018 Imaging   CT A/P: 1. Previously noted lower anterior abdominal wall mass is no longer identified, presumably surgically resected. Cephalad to the site of the prior mass within the low anterior abdominal wall there is some soft tissue thickening seen on today's examination (axial image 54 of series 2). This is nonspecific and will serve as a baseline for future follow-up examinations. 2. No signs of metastatic disease elsewhere in the abdomen or pelvis. 3. Colonic diverticulosis without evidence of acute diverticulitis at this time. 4. Aortic atherosclerosis. 5. Additional incidental findings, as above.   04/18/2019 Imaging   CT A/P: 1.  No evidence of recurrent or metastatic carcinoma within the abdomen or pelvis. 2. Colonic diverticulosis. No radiographic evidence of diverticulitis.   12/23/2019 Imaging   CT A/P: Within the ventral, midline abdominal wall soft tissue mass is identified anterior to the pubic symphysis measuring 5.2 x 3.3 by 3.4 cm (volume = 31 cm^3), image 59/5 and image 78/2. This is compared with 4.9 x 3.0 by 2.6 (volume = 20)cm IMPRESSION: 1. Stable to slight increase in size of ventral, midline abdominal wall soft tissue mass at the level of the pubic symphysis. 2. No signs of solid organ or nodal metastases. 3. Aortic atherosclerosis.   01/06/2020 Relapse/Recurrence   Anterior abdominal wall mass biopsy: A. SOFT TISSUE MASS, PELVIC BODY WALL, NEEDLE CORE BIOPSY:  - Metastatic adenocarcinoma, see comment.   COMMENT:   The tumor has malignant cells with cleared cytoplasm.  Immunohistochemistry is positive for cytokeratin 7, PAX8, racemase, and  NapsinA. The cells are negative for ER, WT-1, and CD10. The morphology  and immunophenotype are consistent with metastatic clear cell carcinoma  of gynecologic origin (by definition  high grade). Dr. Truett Perna was  notified on 01/08/20.    02/06/2020 Surgery   Exlap with resection of pelvic tumor densely adherent to the anterior aspect of the pubic arch. Surgery at [facility]  nodular mass palpable, fixed to the pubic arch on exam. On dissection into the mons, an irregularly shaped and nodular tumor was implanted on the pubic arch. No evidence of disease on visualized bowel or omentum. The palpated anterior abdominal wall was free of disease. The bladder dome peritoneum was free of disease. Few soft nodules on the anterior wall peritoneum were ablated using the bovie. No palpable tumor on or surrounding the pubic arch following resection.   02/06/2020 Pathology Results   A: "Pelvic cavity, pubic mons tumor, excision - Clear cell  carcinoma, received fragmented, 5.5  cm in aggregate (see comment) - Carcinoma involves multiple cauterized tissue edges  ER negative, PR 1+ (20%)   04/06/2020 Genetic Testing   Negative genetic testing on the TumorNext-[facility]+CancerNext testing.  Somatic testing was negative and no germline mutations identified.  The CancerNext gene panel offered by [employer] includes sequencing and rearrangement analysis for the following 34 genes:   APC, ATM, BARD1, BMPR1A, BRCA1, BRCA2, BRIP1, CDH1, CDK4, CDKN2A, CHEK2, DICER1, HOXB13, EPCAM, GREM1, MLH1, MRE11A, MSH2, MSH6, MUTYH, NBN, NF1, PALB2, PMS2, POLD1, POLE, PTEN, RAD50, RAD51C, RAD51D, SMAD4, SMARCA4, STK11, and TP53.  The report date is April 06, 2020.   05/19/2020 Imaging   CT A/P: Interval resolution of suprapubic abdominal wall mass since prior study. No residual metastatic disease identified. Colonic diverticulosis, without radiographic evidence of diverticulitis. Moderate right and small left hernias.   11/26/2020 Imaging   11/26/20: Status post hysterectomy and bilateral salpingo oophorectomy. No evidence of recurrent or metastatic disease. Additional stable ancillary findings as above.   05/27/2021 Imaging   CT A/P: 1. Stable examination status post hysterectomy and bilateral salpingo-oophorectomy, without evidence of recurrent or metastatic disease within the abdomen or pelvis. 2. Moderate volume of formed stool throughout the colon suggestive of constipation. 3. Sigmoid colonic diverticulosis without findings of acute diverticulitis. 4.  Aortic Atherosclerosis (ICD10-I70.0).   02/28/2022 Imaging   CT A/P: 1. No acute intra-abdominal or pelvic pathology. No evidence of recurrent or metastatic disease. 2. Sigmoid diverticulosis. 3. Progression of broad-based herniation of the bowel into the anterior pelvic wall. No bowel obstruction. 4.  Aortic Atherosclerosis (ICD10-I70.0).   Adenocarcinoma of abdominal wall, unclear primary  08/09/2013 Initial  Diagnosis   Adenocarcinoma of abdominal wall, unclear primary   08/16/2013 - 09/25/2013 Chemotherapy   paclitaxel and carboplatin   11/05/2013 Surgery   resection abdominal wall mass    - 02/27/2014 Radiation Therapy   Completed volume directed radiation   01/06/2020 Relapse/Recurrence   Anterior abdominal wall mass biopsy: A. SOFT TISSUE MASS, PELVIC BODY WALL, NEEDLE CORE BIOPSY:  - Metastatic adenocarcinoma, see comment.   COMMENT:   The tumor has malignant cells with cleared cytoplasm.  Immunohistochemistry is positive for cytokeratin 7, PAX8, racemase, and  NapsinA. The cells are negative for ER, WT-1, and CD10. The morphology  and immunophenotype are consistent with metastatic clear cell carcinoma  of gynecologic origin (by definition  high grade). Dr. Truett Perna was  notified on 01/08/20.    02/06/2020 Surgery   Exlap with resection of pelvic tumor densely adherent to the anterior aspect of the pubic arch. Surgery at [facility]  nodular mass palpable, fixed to the pubic arch on exam. On dissection into the mons, an irregularly shaped and nodular tumor was implanted on the pubic arch. No evidence of disease on visualized bowel or omentum. The palpated anterior abdominal wall was free of disease. The bladder dome peritoneum was free of disease. Few soft nodules on the anterior wall peritoneum were ablated using the bovie. No palpable tumor on or surrounding the pubic arch following resection.   02/06/2020 Pathology Results   A: "Pelvic cavity, pubic mons tumor, excision - Clear cell carcinoma, received fragmented, 5.5 cm in aggregate (see comment) - Carcinoma involves multiple cauterized tissue edges  ER negative, PR 1+ (20%)   Metastatic adenocarcinoma of ovary  05/03/2018 Initial Diagnosis   Malignant neoplasm of ovary (HCC)   05/21/2018 - 09/08/2018 Chemotherapy   Patient is on Treatment Plan : OVARIAN Carboplatin/Gemcitabine q21d  Completed RT to left pubic mass in 01/2023,  received 50Gy in 10 Fx. CT A/P 03/27/23: new small nodule in RLQ deep to anterior abdominal wall, suspicious for peritoneal metastasis. Stable soft tissue nodule within suprapubic hernia anterior to R pubic symphysis. Unchanged mildly large left inguinal lymph node. Il-defined mass in medial adductor musculature less well defined and smaller, suggests response to tx. CT A/P 07/04/23: Similar appearance of soft tissue nodules within pelvis. Reponse to therapy of left pelvic adenopathy and metastasis within abductor muscle. Suprapubic hernia nonobstructive, stable.  Interval History: Doing well.  Denies any abdominal or pelvic pain.  Reports baseline bowel bladder function.  Denies any vaginal bleeding.  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  Past Medical/Surgical History: Past Medical History:  Diagnosis Date   Anxiety    new dx   Atrial fibrillation (HCC)    Cancer (HCC) 1988, 2015   ovarian, adenocarcinoma   GERD (gastroesophageal reflux disease)    hx of years ago   Hx of radiation therapy 01/16/14-02/27/14   abdomen    Hypercholesteremia    under control with diet and fish oil   Hypertension    Ovarian cyst    PONV (postoperative nausea and vomiting)    Restless leg syndrome    Thyroid disease     Past Surgical History:  Procedure Laterality Date   APPENDECTOMY     APPLICATION OF A-CELL OF CHEST/ABDOMEN N/A 11/05/2013   Procedure: ABDOMINAL WALL RESCONTRUCTION WITH STRATUS;  Surgeon: Glenna Fellows, MD;  Location: [facility];  Service: Plastics;  Laterality: N/A;   CHOLECYSTECTOMY  2009   LAPAROTOMY N/A 11/05/2013   Procedure: RESECTION OF PELVIC MASS;  Surgeon: Rejeana Brock A. Duard Brady, MD;  Location: [facility];  Service: Gynecology;  Laterality: N/A;   LAPAROTOMY  03/2018   excision of suprapubic mass at [facility]   OOPHORECTOMY     BSO   TONSILLECTOMY  as child   TOTAL ABDOMINAL HYSTERECTOMY  1988   ovarian cyst  BSO    Family History   Problem Relation Age of Onset   Heart disease Mother    Heart disease Sister    Diabetes Sister    Cancer Sister        melanoma-skin cancer   Breast cancer Paternal Grandmother        Age 31's   Cancer Paternal Grandmother        breast   Cancer Paternal Grandfather        prostate/bladder   Colon cancer Neg Hx    Ovarian cancer Neg Hx    Endometrial cancer Neg Hx    Pancreatic cancer Neg Hx    Prostate cancer Neg Hx     Social History   Socioeconomic History   Marital status: Single    Spouse name: Not on file   Number of children: Not on file   Years of education: Not on file   Highest education level: Not on file  Occupational History   Not on file  Tobacco Use   Smoking status: Never   Smokeless tobacco: Never  Vaping Use   Vaping status: Never Used  Substance and Sexual Activity   Alcohol use: No    Alcohol/week: 0.0 standard drinks of alcohol   Drug use: No   Sexual activity: Never    Birth control/protection: Surgical    Comment: HYST  Other Topics Concern   Not on file  Social History Narrative   Single-never  married   No children or pets   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   Enjoys reading, keeps house tidy, plays in bell choir at [employer]: Low Risk  (02/07/2020)   Received [facility], [facility]   Overall Financial Resource Strain (CARDIA)    Difficulty of Paying Living Expenses: Not hard at all  Food Insecurity: No Food Insecurity (12/07/2022)   Hunger Vital Sign    Worried About Running Out of Food in the Last Year: Never true    Ran Out of Food in the Last Year: Never true  Transportation Needs: No Transportation Needs (12/07/2022)   PRAPARE - Administrator, Civil Service (Medical): No    Lack of Transportation (Non-Medical): No  Physical Activity: Not on file  Stress: Not on file  Social Connections: Not on file     Current Medications:  Current Outpatient Medications:    ALPRAZolam (XANAX) 0.5 MG tablet, Take 0.5 mg by mouth at bedtime., Disp: , Rfl:    apixaban (ELIQUIS) 5 MG TABS tablet, TAKE (1) TABLET BY MOUTH TWICE DAILY., Disp: 180 tablet, Rfl: 1   cholecalciferol (VITAMIN D3) 25 MCG (1000 UNIT) tablet, Take 1,000 Units by mouth daily., Disp: , Rfl:    conjugated estrogens (PREMARIN) vaginal cream, Finger tip amount of cream, applied to vulva and outer vagina at night 3 x a week, Disp: 42.5 g, Rfl: 6   HYDROcodone-acetaminophen (NORCO/VICODIN) 5-325 MG tablet, Take 1 tablet by mouth 3 (three) times daily as needed., Disp: , Rfl:    levothyroxine (SYNTHROID) 50 MCG tablet, Take 50 mcg by mouth daily., Disp: , Rfl:    metoprolol tartrate (LOPRESSOR) 25 MG tablet, TAKE (1/2) TABLET BY MOUTH TWICE DAILY., Disp: 90 tablet, Rfl: 3   Multiple Vitamin (MULTIVITAMIN WITH MINERALS) TABS tablet, Take 1 tablet by mouth daily., Disp: , Rfl:    pramipexole (MIRAPEX) 0.125 MG tablet, Take 0.125 mg by mouth 2 (two) times daily., Disp: , Rfl:    tobramycin-dexamethasone (TOBRADEX) ophthalmic solution, Place 1 drop into the left eye 4 (four) times daily., Disp: , Rfl:   Review of Systems: Denies appetite changes, fevers, chills, fatigue, unexplained weight changes. Denies hearing loss, neck lumps or masses, mouth sores, ringing in ears or voice changes. Denies cough or wheezing.  Denies shortness of breath. Denies chest pain or palpitations. Denies leg swelling. Denies abdominal distention, pain, blood in stools, constipation, diarrhea, nausea, vomiting, or early satiety. Denies pain with intercourse, dysuria, frequency, hematuria or incontinence. Denies hot flashes, pelvic pain, vaginal bleeding or vaginal discharge.   Denies joint pain, back pain or muscle pain/cramps. Denies itching, rash, or wounds. Denies dizziness, headaches, numbness or seizures. Denies swollen lymph nodes or glands, denies easy  bruising or bleeding. Denies anxiety, depression, confusion, or decreased concentration.  Physical Exam: BP (!) 114/57 (BP Location: Left Arm, Patient Position: Sitting) Comment: Notified RN  Pulse 64   Temp 97.8 F (36.6 C) (Oral)   Resp 18   Wt 134 lb (60.8 kg)   SpO2 100%   BMI 21.63 kg/m  General: Alert, oriented, no acute distress. HEENT: Normocephalic, atraumatic, sclera anicteric. Chest: Clear to auscultation bilaterally.  No wheezes or rhonchi. Cardiovascular: Regular rate and rhythm, no murmurs. Abdomen: soft, nontender.  Normoactive bowel sounds.  No masses or hepatosplenomegaly appreciated.  Well-healed incisions.  Extremities: Grossly normal range of motion.  Warm, well  perfused.  No edema bilaterally. Skin: No rashes or lesions noted. Lymphatics: No cervical, supraclavicular. Shotty adenopathy in left inguinal area. GU: Edema of the mons consistent with known hernias, right greater than left.  Hernia stable in size since last visit.  There is less erythema on the labia majora bilaterally than there has been previously.  Still an area with some petechia on the right inner labia.  Some areas where there appears to be irritation due to swelling from the hernia.  Loss of architecture of bilateral labia noted as well as atrophy.   Difficulty with speculum exams, this was deferred today.  Bimanual exam with single digit reveals no masses or nodularity.    Laboratory & Radiologic Studies:          Component Ref Range & Units (hover) 1 mo ago 2 mo ago 4 mo ago 6 mo ago 7 mo ago 8 mo ago 9 mo ago  Cancer Antigen (CA) 125 19.0 20.1 CM 26.3 CM 58.0 High  CM 32.4 CM 39.1 High  CM 37.0 C   Assessment & Plan: [name] is a 78 y.o. 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 Repeat imaging with new peritoneal met but otherwise treatment effect in RT field. Follow-up imaging with stable  disease. Foundation One: no actionable target. HRP, MSS, TMB low. HER2+ (2021) by FISH  Overall doing well.  Disease is stable on imaging as of her last CT scan in February.  Her decision was not to start systemic therapy with Enhertu but rather continue with close observation.  She will get repeat imaging in 3 months.  I will plan to see her back in 4 months.  Refill for vaginal estrogen sent today.  Also discussed possibly using Vaseline or other barrier cream to help decrease any skin irritation on bilateral labia due to swelling from her hernia.  22 minutes of total time was spent for this patient encounter, including preparation, face-to-face counseling with the patient and coordination of care, and documentation of the encounter.  Eugene Garnet, MD  Division of Gynecologic Oncology  Department of Obstetrics and Gynecology  [facility] of [facility]

## 2023-08-11 NOTE — Addendum Note (Signed)
 Addended by: Carver Fila on: 08/11/2023 05:57 AM   Modules accepted: Orders

## 2023-08-12 ENCOUNTER — Other Ambulatory Visit: Payer: Self-pay | Admitting: Cardiology

## 2023-08-14 ENCOUNTER — Telehealth: Payer: Self-pay | Admitting: *Deleted

## 2023-08-14 ENCOUNTER — Other Ambulatory Visit: Payer: Self-pay | Admitting: Gynecologic Oncology

## 2023-08-14 DIAGNOSIS — N952 Postmenopausal atrophic vaginitis: Secondary | ICD-10-CM

## 2023-08-14 MED ORDER — ESTRADIOL 0.1 MG/GM VA CREA: TOPICAL_CREAM | VAGINAL | 6 refills | Status: AC

## 2023-08-14 NOTE — Telephone Encounter (Signed)
 Patient called the office and left a message stating she went to pick up her prescription for Premarin and it will cost $169. Pt is requesting provider send in a generic brand. Warner Mccreedy, NP aware and will send in a new Rx to patients pharmacy.   Pt was notified and aware of new Rx and thanked the office.

## 2023-08-30 ENCOUNTER — Telehealth: Payer: Self-pay | Admitting: *Deleted

## 2023-08-30 NOTE — Telephone Encounter (Signed)
 Per provider moved appt from 8/1 to 8/29. Patient aware of new date/time

## 2023-09-11 ENCOUNTER — Ambulatory Visit: Payer: HMO | Attending: Radiation Oncology

## 2023-09-11 VITALS — Wt 133.2 lb

## 2023-09-11 DIAGNOSIS — C44509 Unspecified malignant neoplasm of skin of other part of trunk: Secondary | ICD-10-CM | POA: Insufficient documentation

## 2023-09-11 DIAGNOSIS — C561 Malignant neoplasm of right ovary: Secondary | ICD-10-CM | POA: Insufficient documentation

## 2023-09-11 NOTE — Therapy (Signed)
 OUTPATIENT PHYSICAL THERAPY SOZO SCREENING NOTE   Patient Name: [name] MRN: [id number] DOB:1945/10/21, 78 y.o., female Today's Date: 09/11/2023  PCP: Minus Amel, MD REFERRING PROVIDER: Bettejane Brownie,*   PT End of Session - 09/11/23 [phone]     Visit Number 1   # unchnaged due to screen only   PT Start Time 0952    PT Stop Time 0956    PT Time Calculation (min) 4 min    Activity Tolerance Patient tolerated treatment well    Behavior During Therapy [facility] for tasks assessed/performed             Past Medical History:  Diagnosis Date   Anxiety    new dx   Atrial fibrillation (HCC)    Cancer (HCC) 1988, 2015   ovarian, adenocarcinoma   GERD (gastroesophageal reflux disease)    hx of years ago   Hx of radiation therapy 01/16/14-02/27/14   abdomen    Hypercholesteremia    under control with diet and fish oil   Hypertension    Ovarian cyst    PONV (postoperative nausea and vomiting)    Restless leg syndrome    Thyroid  disease    Past Surgical History:  Procedure Laterality Date   APPENDECTOMY     APPLICATION OF A-CELL OF CHEST/ABDOMEN N/A 11/05/2013   Procedure: ABDOMINAL WALL RESCONTRUCTION WITH STRATUS;  Surgeon: Alger Infield, MD;  Location: [facility];  Service: Plastics;  Laterality: N/A;   CHOLECYSTECTOMY  2009   LAPAROTOMY N/A 11/05/2013   Procedure: RESECTION OF PELVIC MASS;  Surgeon: Daryel Ensign A. Clerance Dais, MD;  Location: [facility];  Service: Gynecology;  Laterality: N/A;   LAPAROTOMY  03/2018   excision of suprapubic mass at [facility]   OOPHORECTOMY     BSO   TONSILLECTOMY  as child   TOTAL ABDOMINAL HYSTERECTOMY  1988   ovarian cyst  BSO   Patient Active Problem List   Diagnosis Date Noted   Incisional hernia, without obstruction or gangrene 08/30/2021   Genetic testing 04/06/2020   Hypothyroidism 02/03/2020   Restless leg syndrome 02/03/2020   Atrial fibrillation (HCC) 01/13/2015   Chest pain 01/13/2015   Ovarian ca (HCC) 11/05/2013   Metastatic  adenocarcinoma of ovary 08/20/2013   Abdominal wall mass of suprapubic region 08/20/2013   Adenocarcinoma of abdominal wall, unclear primary 08/09/2013   Neoplasm of abdominal wall of uncertain behavior 07/23/2013    REFERRING DIAG: right breast cancer at risk for lymphedema  THERAPY DIAG:  Malignant neoplasm of right ovary (HCC)  Malignant neoplasm of abdominal wall  PERTINENT HISTORY: Diagnosed 1988 at time of hysterectomy with carcinoma of left ovary. In 2015 diagnosed with abdominal wall reoccurence in right rectus muscle and she had 3 rounds of chemo with no response. 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  She received carboplatin  and Gemzar  therapy. 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 She went on to receive palliative radiotherapy which  she completed in December 2021 to the tumor within and involving the pelvis. A PET on 11/30/22 showed hypermetabolic activity in the anterior margin of the left inferior pubic ramus and left inguinal node.  Dr. Jeryl Moris would like to biopsy a groin LN to be sure it is recurrent disease, and if so 2-5 weeks of radiation.She's seen to consider additional radiotherapy to the pelvis and left groin region.   PRECAUTIONS: right UE Lymphedema risk, None  SUBJECTIVE: Pt returns for her 3 month L-Dex screen.   PAIN:  Are  you having pain? No  SOZO SCREENING: Patient was assessed today using the SOZO machine to determine the lymphedema index score. This was compared to her baseline score. It was determined that she is within the recommended range when compared to her baseline and no further action is needed at this time. She will continue SOZO screenings. These are done every 3 months for 2 years post operatively followed by every 6 months for 2 years, and then annually.   L-DEX FLOWSHEETS - 09/11/23 0900       L-DEX LYMPHEDEMA SCREENING   Measurement Type Unilateral    L-DEX MEASUREMENT EXTREMITY Lower Extremity    POSITION  Standing    DOMINANT SIDE Right    At Risk Side Right    BASELINE SCORE (UNILATERAL) -7.1    L-DEX SCORE (UNILATERAL) -8.7    VALUE CHANGE (UNILAT) -1.6               Denyce Flank, PTA 09/11/2023, 9:56 AM

## 2023-09-14 DIAGNOSIS — Z6820 Body mass index (BMI) 20.0-20.9, adult: Secondary | ICD-10-CM | POA: Diagnosis not present

## 2023-09-14 DIAGNOSIS — G894 Chronic pain syndrome: Secondary | ICD-10-CM | POA: Diagnosis not present

## 2023-10-05 ENCOUNTER — Other Ambulatory Visit: Payer: Self-pay | Admitting: Cardiology

## 2023-10-05 NOTE — Telephone Encounter (Signed)
 Prescription refill request for Eliquis  received. Indication: afib  Last office visit: [place] 05/19/2023 Scr: 0.76, 07/11/2023 Age: 78 yo  Weight: 60.4 kg   Refill sent.

## 2023-10-12 ENCOUNTER — Inpatient Hospital Stay: Attending: Gynecologic Oncology

## 2023-10-12 ENCOUNTER — Inpatient Hospital Stay: Admitting: Oncology

## 2023-10-12 VITALS — BP 119/60 | HR 62 | Temp 98.0°F | Resp 18 | Ht 66.0 in | Wt 133.7 lb

## 2023-10-12 DIAGNOSIS — I4891 Unspecified atrial fibrillation: Secondary | ICD-10-CM | POA: Diagnosis not present

## 2023-10-12 DIAGNOSIS — Z8543 Personal history of malignant neoplasm of ovary: Secondary | ICD-10-CM | POA: Insufficient documentation

## 2023-10-12 DIAGNOSIS — C569 Malignant neoplasm of unspecified ovary: Secondary | ICD-10-CM | POA: Diagnosis not present

## 2023-10-12 DIAGNOSIS — Z7901 Long term (current) use of anticoagulants: Secondary | ICD-10-CM | POA: Insufficient documentation

## 2023-10-12 DIAGNOSIS — Z87440 Personal history of urinary (tract) infections: Secondary | ICD-10-CM | POA: Diagnosis not present

## 2023-10-12 DIAGNOSIS — G2581 Restless legs syndrome: Secondary | ICD-10-CM | POA: Diagnosis not present

## 2023-10-12 NOTE — Progress Notes (Signed)
 [facility] OFFICE PROGRESS NOTE   Diagnosis: Gynecologic cancer  INTERVAL HISTORY:   [name] returns as scheduled.  She generally feels well.  She frequently has bowel movements after eating.  No diarrhea.  No bleeding.  She has occasional pain in the pubic region.  No consistent pain.  Objective:  Vital signs in last 24 hours:  Blood pressure 119/60, pulse 62, temperature 98 F (36.7 C), temperature source Temporal, resp. rate 18, height 5\' 6"  (1.676 m), weight 133 lb 11.2 oz (60.6 kg), SpO2 100%.     Lymphatics: No cervical, supraclavicular, axillary, or inguinal nodes Resp: Lungs clear bilaterally Cardio: Regular rate and rhythm GI: No hepatosplenomegaly, no mass, no apparent ascites, no evidence of recurrent tumor at the low abdominal and transverse pelvic scars.  Reducible hernias in the pubic region.  No palpable mass. Vascular: No leg edema   Lab Results:  Lab Results  Component Value Date   WBC 6.0 12/28/2022   HGB 11.9 (L) 12/28/2022   HCT 36.7 12/28/2022   MCV 97.9 12/28/2022   PLT 249 12/28/2022   NEUTROABS 4.0 05/06/2020    CMP  Lab Results  Component Value Date   NA 138 07/11/2023   K 4.4 07/11/2023   CL 103 07/11/2023   CO2 30 07/11/2023   GLUCOSE 86 07/11/2023   BUN 20 07/11/2023   CREATININE 0.76 07/11/2023   CALCIUM 10.2 07/11/2023   PROT 6.6 05/25/2021   ALBUMIN 4.0 05/25/2021   AST 30 05/25/2021   ALT 31 05/25/2021   ALKPHOS 49 05/25/2021   BILITOT 0.3 05/25/2021   GFRNONAA >60 07/11/2023   GFRAA >60 12/25/2019    Lab Results  Component Value Date   CA125 10 03/17/2016    Medications: I have reviewed the patient's current medications.   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   Elevated CA 125.   Cycle 1 Taxol /carboplatin  08/16/2013.   Cycle 2  Taxol /carboplatin  09/06/2013.   Cycle 3 Taxol /carboplatin  09/27/2013   CT 10/11/2013 with a stable abdominal wall mass. Status post resection of abdominal wall mass with abdominal wall reconstruction 11/05/2013. Final pathology showed adenocarcinoma. Specimen extensively involved with adenocarcinoma which focally involved the edge of the specimen. Immunostains and Foundation 1 testing were nonspecific for a primary tumor location. Adjuvant radiation to the abdominal wall 01/16/2014 through 02/27/2014 CT 01/15/2018 while in the emergency room for evaluation of back pain- midline abdominal wall mass above the pubic symphysis and adjacent to the bladder Ultrasound-guided biopsy of the abdominal wall mass 01/25/2018-adenocarcinoma similar to the 2015 biopsy PET scan 03/01/2018-hypermetabolism associated with a cystic and solid suprapubic mass.  Focal uptake identified in the left L3-4 facets without underlying bony lesion.  No other sites of unexpected or suspicious hypermetabolism in the neck, chest, abdomen or pelvis. Exploratory laparotomy, resection of abdominal wall tumor, oversew of bladder peritoneum, cystoscopy 03/28/2018 (findings included a cystic and solid mass encountered below the lower edge of the incision extending into the space of retzius and adherent to the pubic arch inferiorly, bladder dome posteriorly and prior abdominal wall repair anteriorly.  No nodularity palpated in the tumor bed.  Visualized bowel and omentum free of disease.  Pelvic peritoneum appeared normal).  A "small "amount of tumor on the periosteum was cauterized using a Bovie. Pathology on pelvic tumor-metastatic high-grade adenocarcinoma, favor clear-cell carcinoma of gynecologic origin, fragments  up to 5.8 cm in size; ER negative, PR positive, 1+ (weak staining) to focally 2+, 30-40%, MSI-stable, mutation burden-4,CREBBP alteration Cycle 1 gemcitabine /carboplatin  05/21/2018; day 8 held due to neutropenia Cycle 2  gemcitabine /carboplatin  06/11/2018 (day 8 gemcitabine  eliminated) Cycle 3 gemcitabine /carboplatin  07/06/2018 Cycle 4 gemcitabine /carboplatin  07/27/2018 (Udenyca  added) Cycle 5 gemcitabine /carboplatin  08/17/2018 Cycle 6 gemcitabine /carboplatin  09/07/2018  CT 10/19/2018- low anterior abdominal wall mass no longer identified, soft tissue thickening cephalad to the site of prior mass-nonspecific, no additional evidence of metastatic disease CT 04/18/2019-no evidence of recurrent or metastatic disease CT 12/23/2019-slight increase in size of ventral midline soft tissue mass near the pubis, no other evidence of disease progression Biopsy pelvic body wall soft tissue mass 01/06/2020-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, ER negative, HER2 (2+) positive by FISH Surgical excision of the soft tissue mass adherent to the pubis 02/06/2020-clear cell carcinoma, positive surgical margins Adjuvant radiation 03/25/2020- 04/29/2020 CT abdomen/pelvis 05/19/2020- resolution of suprapubic abdominal wall mass, no evidence of metastatic disease, moderate right and small left hernias CT abdomen/pelvis 11/26/2020-no recurrence of the lower pelvic soft tissue mass, small bilateral lower pelvic hernias, no evidence of recurrent or metastatic disease CT abdomen/pelvis 05/27/2021-no evidence of recurrent disease CT abdomen/pelvis 02/28/2022-no evidence of recurrent disease progressive bowel herniation in the anterior pelvic wall CT abdomen/pelvis 08/24/2022-no evidence of recurrent disease CT abdomen/pelvis 11/04/2022-mild increase in size of a soft tissue density at the inferior aspect of the suprapubic hernia and anterior margin of the right pubic bone PET 11/30/2022-hypermetabolic soft tissue mass at the anterior margin of the left inferior pubic ramus, hypermetabolic adjacent left inguinal node, no evidence of distant metastatic disease CT biopsy left soft tissue mass left pectineus  muscle 12/28/2022-metastatic adenocarcinoma morphologically consistent with patient's known history of clear-cell carcinoma of gynecologic origin, HRD negative, MSS, tumor mutation burden 7, CREBBP alteration Radiation to the left pubis mass 01/19/2023 - 02/01/2023, 50 Gray in 10 fractions CT abdomen/pelvis 03/27/2023-new small nodule in the right lower quadrant deep to the anterior abdominal wall, unchanged mildly enlarged left inguinal node, ill-defined mass in the medial adductor musculature on the left less well-defined CT abdomen/pelvis 07/04/2023: Stable pelvic soft tissue nodules, decreased left pelvic adenopathy and left abductor metastasis, no new site of metastatic disease, suprapubic hernia  Ovarian cancer in 1988, low-grade adenocarcinoma with areas of "borderline" carcinoma, status post a hysterectomy and bilateral oophorectomy. New onset atrial fibrillation August 2016, maintained on eliquis  Recurrent urinary tract infections Restless legs       Disposition: [name] appears unchanged.  There is no clinical evidence for progression of the gynecologic tumor.  We will follow-up on the CA125 from today.  She will be scheduled for a restaging abdomen/pelvis CT in 3 months.  She is scheduled to see Dr. Orvil Bland in August.  She will return for an office visit here in mid September.  Coni Deep, MD  10/12/2023  11:01 AM

## 2023-10-13 LAB — CA 125: Cancer Antigen (CA) 125: 19.9 U/mL (ref 0.0–38.1)

## 2023-10-16 ENCOUNTER — Telehealth: Payer: Self-pay | Admitting: *Deleted

## 2023-10-16 NOTE — Telephone Encounter (Signed)
 Left VM that CA 125 is stable at 19.9. Follow up as scheduled

## 2023-10-17 DIAGNOSIS — D485 Neoplasm of uncertain behavior of skin: Secondary | ICD-10-CM | POA: Diagnosis not present

## 2023-10-17 DIAGNOSIS — L720 Epidermal cyst: Secondary | ICD-10-CM | POA: Diagnosis not present

## 2023-11-09 DIAGNOSIS — G894 Chronic pain syndrome: Secondary | ICD-10-CM | POA: Diagnosis not present

## 2023-11-09 DIAGNOSIS — G2581 Restless legs syndrome: Secondary | ICD-10-CM | POA: Diagnosis not present

## 2023-11-09 DIAGNOSIS — Z6820 Body mass index (BMI) 20.0-20.9, adult: Secondary | ICD-10-CM | POA: Diagnosis not present

## 2023-12-11 ENCOUNTER — Ambulatory Visit: Attending: Radiation Oncology

## 2023-12-11 VITALS — Wt 131.5 lb

## 2023-12-11 DIAGNOSIS — C561 Malignant neoplasm of right ovary: Secondary | ICD-10-CM | POA: Insufficient documentation

## 2023-12-11 NOTE — Therapy (Signed)
 OUTPATIENT PHYSICAL THERAPY [facility] SCREENING NOTE   Patient Name: [name] MRN: [id number] DOB:February 17, 1946, 78 y.o., female Today's Date: 12/11/2023  PCP: Marvine Rush, MD REFERRING PROVIDER: Lanell Donald Stagger,*   PT End of Session - 12/11/23 1021     Visit Number 1   # unchanged due to screen only   PT Start Time 1020    PT Stop Time 1024    PT Time Calculation (min) 4 min    Activity Tolerance Patient tolerated treatment well    Behavior During Therapy [facility] for tasks assessed/performed          Past Medical History:  Diagnosis Date   Anxiety    new dx   Atrial fibrillation (HCC)    Cancer (HCC) 1988, 2015   ovarian, adenocarcinoma   GERD (gastroesophageal reflux disease)    hx of years ago   Hx of radiation therapy 01/16/14-02/27/14   abdomen    Hypercholesteremia    under control with diet and fish oil   Hypertension    Ovarian cyst    PONV (postoperative nausea and vomiting)    Restless leg syndrome    Thyroid  disease    Past Surgical History:  Procedure Laterality Date   APPENDECTOMY     APPLICATION OF A-CELL OF CHEST/ABDOMEN N/A 11/05/2013   Procedure: ABDOMINAL WALL RESCONTRUCTION WITH STRATUS;  Surgeon: Earlis Ranks, MD;  Location: [facility];  Service: Plastics;  Laterality: N/A;   CHOLECYSTECTOMY  2009   LAPAROTOMY N/A 11/05/2013   Procedure: RESECTION OF PELVIC MASS;  Surgeon: Elenore A. Dodie, MD;  Location: [facility];  Service: Gynecology;  Laterality: N/A;   LAPAROTOMY  03/2018   excision of suprapubic mass at [facility]   OOPHORECTOMY     BSO   TONSILLECTOMY  as child   TOTAL ABDOMINAL HYSTERECTOMY  1988   ovarian cyst  BSO   Patient Active Problem List   Diagnosis Date Noted   Incisional hernia, without obstruction or gangrene 08/30/2021   Genetic testing 04/06/2020   Hypothyroidism 02/03/2020   Restless leg syndrome 02/03/2020   Atrial fibrillation (HCC) 01/13/2015   Chest pain 01/13/2015   Ovarian ca (HCC) 11/05/2013   Metastatic  adenocarcinoma of ovary 08/20/2013   Abdominal wall mass of suprapubic region 08/20/2013   Adenocarcinoma of abdominal wall, unclear primary 08/09/2013   Neoplasm of abdominal wall of uncertain behavior 07/23/2013    REFERRING DIAG: right breast cancer at risk for lymphedema  THERAPY DIAG:  Malignant neoplasm of right ovary (HCC)  PERTINENT HISTORY: Diagnosed 1988 at time of hysterectomy with carcinoma of left ovary. In 2015 diagnosed with abdominal wall reoccurence in right rectus muscle and she had 3 rounds of chemo with no response. 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  She received carboplatin  and Gemzar  therapy. In August 2021 there appeared to be recurrence at the pubic symphysis of the soft tissue mass measuring  and repeat biopsy showed metastatic carcinoma consistent with metastatic clear cell carcinoma on 02/06/2020 she underwent exploratory laparotomy with resection of tumor densely adherent to the anterior aspect of the pubic arch at [facility] and a 5.5 cm aggregate clear cell carcinoma was identified, and carcinoma  Her tumor was ER negative PR 1+20%. She went on to receive palliative radiotherapy which she completed in December 2021 to the tumor within  and involving the pelvis. A PET on 11/30/22 showed hypermetabolic activity in the anterior margin of the left inferior pubic ramus and left inguinal node.  Dr. Dewey would like to biopsy a groin LN to be sure it is recurrent disease, and if so 2-5 weeks of radiation.She's seen to consider additional radiotherapy to the pelvis and left groin region.   PRECAUTIONS: right UE Lymphedema risk, None  SUBJECTIVE: Pt returns for her 3 month L-Dex screen.   PAIN:  Are you having pain? No  SOZO  SCREENING: Patient was assessed today using the SOZO machine to determine the lymphedema index score. This was compared to her baseline score. It was determined that she is within the recommended range when compared to her baseline and no further action is needed at this time. She will continue SOZO screenings. These are done every 3 months for 2 years post operatively followed by every 6 months for 2 years, and then annually.   L-DEX FLOWSHEETS - 12/11/23 1000       L-DEX LYMPHEDEMA SCREENING   Measurement Type Unilateral    L-DEX MEASUREMENT EXTREMITY Lower Extremity    POSITION  Standing    DOMINANT SIDE Right    At Risk Side Right    BASELINE SCORE (UNILATERAL) -7.1    L-DEX SCORE (UNILATERAL) -8.1    VALUE CHANGE (UNILAT) -1            Aden Berwyn Caldron, PTA 12/11/2023, 10:23 AM

## 2023-12-15 ENCOUNTER — Ambulatory Visit: Admitting: Gynecologic Oncology

## 2023-12-15 ENCOUNTER — Other Ambulatory Visit

## 2023-12-23 ENCOUNTER — Ambulatory Visit
Admission: EM | Admit: 2023-12-23 | Discharge: 2023-12-23 | Disposition: A | Discharge status: Home / Self Care | Attending: Family Medicine | Admitting: Family Medicine

## 2023-12-23 DIAGNOSIS — N390 Urinary tract infection, site not specified: Secondary | ICD-10-CM | POA: Diagnosis not present

## 2023-12-23 LAB — POCT URINE DIPSTICK
Bilirubin, UA: NEGATIVE
Glucose, UA: NEGATIVE mg/dL
Ketones, POC UA: NEGATIVE mg/dL
Nitrite, UA: POSITIVE — AB
POC PROTEIN,UA: 30 — AB
Spec Grav, UA: 1.02 (ref 1.010–1.025)
Urobilinogen, UA: 0.2 U/dL
pH, UA: 6.5 (ref 5.0–8.0)

## 2023-12-23 MED ORDER — CEPHALEXIN 500 MG PO CAPS: 500.0000 mg | ORAL_CAPSULE | Freq: Two times a day (BID) | ORAL | 0 refills | Status: DC

## 2023-12-23 NOTE — ED Triage Notes (Signed)
 Pt reports UTI, cloudy urine, urinary urgency, urinary frequency, burning with urination started x 2 days ago but has worsened today.

## 2023-12-24 NOTE — ED Provider Notes (Signed)
 [facility]    CSN: [id number] Arrival date & time: 12/23/23  1553      History   Chief Complaint No chief complaint on file.   HPI [name] is a 78 y.o. female.   Pt reports UTI, cloudy urine, urinary urgency, urinary frequency, burning with urination started x 2 days ago but has worsened today.       Past Medical History:  Diagnosis Date   Anxiety    new dx   Atrial fibrillation (HCC)    Cancer (HCC) 1988, 2015   ovarian, adenocarcinoma   GERD (gastroesophageal reflux disease)    hx of years ago   Hx of radiation therapy 01/16/14-02/27/14   abdomen    Hypercholesteremia    under control with diet and fish oil   Hypertension    Ovarian cyst    PONV (postoperative nausea and vomiting)    Restless leg syndrome    Thyroid  disease     Patient Active Problem List   Diagnosis Date Noted   Incisional hernia, without obstruction or gangrene 08/30/2021   Genetic testing 04/06/2020   Hypothyroidism 02/03/2020   Restless leg syndrome 02/03/2020   Atrial fibrillation (HCC) 01/13/2015   Chest pain 01/13/2015   Ovarian ca (HCC) 11/05/2013   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 Dodie, MD;  Location: [facility];  Service: Gynecology;  Laterality: N/A;   LAPAROTOMY  03/2018   excision of suprapubic mass [facility]   OOPHORECTOMY     BSO   TONSILLECTOMY  as child   TOTAL ABDOMINAL HYSTERECTOMY   1988   ovarian cyst  BSO    OB History     Gravida  0   Para      Term      Preterm      AB      Living         SAB      IAB      Ectopic      Multiple      Live Births               Home Medications    Prior to Admission medications   Medication Sig Start Date End Date Taking? Authorizing Provider  cephALEXin  (KEFLEX ) 500 MG capsule Take 1 capsule (500 mg total) by mouth 2 (two) times daily. 12/23/23  Yes Stuart Vernell Norris, PA-C  ALPRAZolam  (XANAX ) 0.5 MG tablet Take 0.5 mg by mouth at bedtime. 01/04/23   [provider]  cholecalciferol (VITAMIN D3) 25 MCG (1000 UNIT) tablet Take 1,000 Units by mouth daily.    [provider]  ELIQUIS  5 MG TABS tablet TAKE (1) TABLET BY MOUTH TWICE DAILY. 10/05/23   Alvan Dorn JULIANNA, MD  estradiol  (ESTRACE  VAGINAL) 0.1 MG/GM vaginal cream Finger tip amount of cream applied to vulva and outer vagina at night 3 x a week 08/14/23   Cross, Melissa D, NP  HYDROcodone -acetaminophen  (  NORCO/VICODIN) 5-325 MG tablet Take 1 tablet by mouth 3 (three) times daily as needed. 04/11/22   [provider]  levothyroxine  (SYNTHROID ) 50 MCG tablet Take 50 mcg by mouth daily. 06/23/20   [provider]  metoprolol  tartrate (LOPRESSOR ) 25 MG tablet TAKE (1/2) TABLET BY MOUTH TWICE DAILY. 08/14/23   Alvan Dorn FALCON, MD  Multiple Vitamin (MULTIVITAMIN WITH MINERALS) TABS tablet Take 1 tablet by mouth daily.    [provider]  pramipexole (MIRAPEX) 0.125 MG tablet Take 0.125 mg by mouth 2 (two) times daily.    [provider]    Family History Family History  Problem Relation Age of Onset   Heart disease Mother    Heart disease Sister    Diabetes Sister    Cancer Sister        melanoma-skin cancer   Breast cancer Paternal Grandmother        Age 33's   Cancer Paternal Grandmother        breast   Cancer Paternal Grandfather        prostate/bladder   Colon cancer Neg Hx    Ovarian  cancer Neg Hx    Endometrial cancer Neg Hx    Pancreatic cancer Neg Hx    Prostate cancer Neg Hx     Social History Social History   Tobacco Use   Smoking status: Never   Smokeless tobacco: Never  Vaping Use   Vaping status: Never Used  Substance Use Topics   Alcohol use: No    Alcohol/week: 0.0 standard drinks of alcohol   Drug use: No     Allergies   Patient has no known allergies.   Review of Systems Review of Systems PER HPI  Physical Exam Triage Vital Signs ED Triage Vitals  Encounter Vitals Group     BP 12/23/23 1557 (!) 146/64     Girls Systolic BP Percentile --      Girls Diastolic BP Percentile --      Boys Systolic BP Percentile --      Boys Diastolic BP Percentile --      Pulse Rate 12/23/23 1557 60     Resp 12/23/23 1557 20     Temp 12/23/23 1557 98 F (36.7 C)     Temp Source 12/23/23 1557 Oral     SpO2 12/23/23 1557 96 %     Weight --      Height --      Head Circumference --      Peak Flow --      Pain Score 12/23/23 1558 0     Pain Loc --      Pain Education --      Exclude from Growth Chart --    No data found.  Updated Vital Signs BP (!) 146/64 (BP Location: Right Arm)   Pulse 60   Temp 98 F (36.7 C) (Oral)   Resp 20   SpO2 96%   Visual Acuity Right Eye Distance:   Left Eye Distance:   Bilateral Distance:    Right Eye Near:   Left Eye Near:    Bilateral Near:     Physical Exam Vitals and nursing note reviewed.  Constitutional:      Appearance: Normal appearance. She is not ill-appearing.  HENT:     Head: Atraumatic.  Eyes:     Extraocular Movements: Extraocular movements intact.     Conjunctiva/sclera: Conjunctivae normal.  Cardiovascular:     Rate and Rhythm: Normal rate.  Pulmonary:     Effort: Pulmonary effort is normal.  Abdominal:     General: Bowel sounds are normal. There is no distension.     Palpations: Abdomen is soft.     Tenderness: There is no abdominal tenderness. There is no right CVA tenderness,  left CVA tenderness or guarding.  Musculoskeletal:        General: Normal range of motion.     Cervical back: Normal range of motion and neck supple.  Skin:    General: Skin is warm and dry.  Neurological:     Mental Status: She is alert and oriented to person, place, and time.  Psychiatric:        Mood and Affect: Mood normal.        Thought Content: Thought content normal.        Judgment: Judgment normal.      UC Treatments / Results  Labs (all labs ordered are listed, but only abnormal results are displayed) Labs Reviewed  URINE CULTURE - Abnormal; Notable for the following components:      Result Value   Culture   (*)    Value: >=100,000 COLONIES/mL GRAM NEGATIVE RODS SUSCEPTIBILITIES TO FOLLOW Performed at [facility], [address], [place], [place] [address]    All other components within normal limits  POCT URINE DIPSTICK - Abnormal; Notable for the following components:   Clarity, UA cloudy (*)    Blood, UA large (*)    POC PROTEIN,UA =30 (*)    Nitrite, UA Positive (*)    Leukocytes, UA Large (3+) (*)    All other components within normal limits    EKG   Radiology No results found.  Procedures Procedures (including critical care time)  Medications Ordered in UC Medications - No data to display  Initial Impression / Assessment and Plan / UC Course  I have reviewed the triage vital signs and the nursing notes.  Pertinent labs & imaging results that were available during my care of the patient were reviewed by me and considered in my medical decision making (see chart for details).     Urinalysis today with evidence of a urinary tract infection.  Will treat with Keflex , fluids and await urine culture.  Adjust if needed.  Return for worsening symptoms.  Final Clinical Impressions(s) / UC Diagnoses   Final diagnoses:  Acute lower UTI   Discharge Instructions   None    ED Prescriptions     Medication Sig Dispense Auth. Provider    cephALEXin  (KEFLEX ) 500 MG capsule Take 1 capsule (500 mg total) by mouth 2 (two) times daily. 10 capsule Stuart Vernell Norris, [place]      PDMP not reviewed this encounter.   Stuart Vernell Norris, [place] 12/24/23 1540

## 2023-12-25 ENCOUNTER — Ambulatory Visit (HOSPITAL_COMMUNITY): Payer: Self-pay

## 2023-12-25 LAB — URINE CULTURE: Culture: >=100000 — AB

## 2024-01-01 ENCOUNTER — Ambulatory Visit: Payer: Self-pay | Admitting: Oncology

## 2024-01-01 ENCOUNTER — Ambulatory Visit (HOSPITAL_BASED_OUTPATIENT_CLINIC_OR_DEPARTMENT_OTHER)
Admission: RE | Admit: 2024-01-01 | Discharge: 2024-01-01 | Disposition: A | Discharge status: Home / Self Care | Source: Ambulatory Visit | Attending: Oncology | Admitting: Oncology

## 2024-01-01 ENCOUNTER — Inpatient Hospital Stay: Attending: Oncology

## 2024-01-01 DIAGNOSIS — Z8543 Personal history of malignant neoplasm of ovary: Secondary | ICD-10-CM | POA: Insufficient documentation

## 2024-01-01 DIAGNOSIS — Z90710 Acquired absence of both cervix and uterus: Secondary | ICD-10-CM | POA: Insufficient documentation

## 2024-01-01 DIAGNOSIS — Z9079 Acquired absence of other genital organ(s): Secondary | ICD-10-CM | POA: Insufficient documentation

## 2024-01-01 DIAGNOSIS — C762 Malignant neoplasm of abdomen: Secondary | ICD-10-CM | POA: Diagnosis not present

## 2024-01-01 DIAGNOSIS — Z8603 Personal history of neoplasm of uncertain behavior: Secondary | ICD-10-CM | POA: Diagnosis not present

## 2024-01-01 DIAGNOSIS — C569 Malignant neoplasm of unspecified ovary: Secondary | ICD-10-CM

## 2024-01-01 DIAGNOSIS — Z90722 Acquired absence of ovaries, bilateral: Secondary | ICD-10-CM | POA: Diagnosis not present

## 2024-01-01 DIAGNOSIS — K439 Ventral hernia without obstruction or gangrene: Secondary | ICD-10-CM | POA: Diagnosis not present

## 2024-01-01 LAB — BASIC METABOLIC PANEL - CANCER CENTER ONLY
Anion gap: 9 (ref 5–15)
BUN: 15 mg/dL (ref 8–23)
CO2: 28 mmol/L (ref 22–32)
Calcium: 10.8 mg/dL — ABNORMAL HIGH (ref 8.9–10.3)
Chloride: 103 mmol/L (ref 98–111)
Creatinine: 0.72 mg/dL (ref 0.44–1.00)
GFR, Estimated: >60 mL/min (ref >60)
Glucose, Bld: 95 mg/dL (ref 70–99)
Potassium: 4.5 mmol/L (ref 3.5–5.1)
Sodium: 140 mmol/L (ref 135–145)

## 2024-01-01 MED ORDER — IOHEXOL 300 MG/ML  SOLN
100.0000 mL | Freq: Once | INTRAMUSCULAR | Status: AC | PRN
  Administered 2024-01-01: 100 mL via INTRAVENOUS

## 2024-01-02 ENCOUNTER — Other Ambulatory Visit: Payer: Self-pay | Admitting: *Deleted

## 2024-01-02 DIAGNOSIS — C569 Malignant neoplasm of unspecified ovary: Secondary | ICD-10-CM

## 2024-01-02 LAB — CA 125: Cancer Antigen (CA) 125: 21.6 U/mL (ref 0.0–38.1)

## 2024-01-02 NOTE — Progress Notes (Signed)
 Added BMP to next lab appointment per Dr. Cloretta.

## 2024-01-09 ENCOUNTER — Encounter: Payer: Self-pay | Admitting: Gynecologic Oncology

## 2024-01-09 DIAGNOSIS — Z86007 Personal history of in-situ neoplasm of skin: Secondary | ICD-10-CM | POA: Diagnosis not present

## 2024-01-09 DIAGNOSIS — D485 Neoplasm of uncertain behavior of skin: Secondary | ICD-10-CM | POA: Diagnosis not present

## 2024-01-09 DIAGNOSIS — L821 Other seborrheic keratosis: Secondary | ICD-10-CM | POA: Diagnosis not present

## 2024-01-09 DIAGNOSIS — Z08 Encounter for follow-up examination after completed treatment for malignant neoplasm: Secondary | ICD-10-CM | POA: Diagnosis not present

## 2024-01-09 DIAGNOSIS — L814 Other melanin hyperpigmentation: Secondary | ICD-10-CM | POA: Diagnosis not present

## 2024-01-10 DIAGNOSIS — Z6820 Body mass index (BMI) 20.0-20.9, adult: Secondary | ICD-10-CM | POA: Diagnosis not present

## 2024-01-10 DIAGNOSIS — G894 Chronic pain syndrome: Secondary | ICD-10-CM | POA: Diagnosis not present

## 2024-01-12 ENCOUNTER — Inpatient Hospital Stay: Admitting: Gynecologic Oncology

## 2024-01-12 ENCOUNTER — Inpatient Hospital Stay

## 2024-01-12 ENCOUNTER — Encounter: Payer: Self-pay | Admitting: Gynecologic Oncology

## 2024-01-12 VITALS — BP 135/57 | HR 66 | Temp 97.9°F | Resp 20 | Wt 128.4 lb

## 2024-01-12 DIAGNOSIS — C801 Malignant (primary) neoplasm, unspecified: Secondary | ICD-10-CM

## 2024-01-12 DIAGNOSIS — N952 Postmenopausal atrophic vaginitis: Secondary | ICD-10-CM

## 2024-01-12 DIAGNOSIS — Z8603 Personal history of neoplasm of uncertain behavior: Secondary | ICD-10-CM | POA: Diagnosis not present

## 2024-01-12 DIAGNOSIS — C569 Malignant neoplasm of unspecified ovary: Secondary | ICD-10-CM

## 2024-01-12 DIAGNOSIS — K432 Incisional hernia without obstruction or gangrene: Secondary | ICD-10-CM

## 2024-01-12 DIAGNOSIS — Z8543 Personal history of malignant neoplasm of ovary: Secondary | ICD-10-CM

## 2024-01-12 LAB — BASIC METABOLIC PANEL - CANCER CENTER ONLY
Anion gap: 6 (ref 5–15)
BUN: 16 mg/dL (ref 8–23)
CO2: 30 mmol/L (ref 22–32)
Calcium: 10.2 mg/dL (ref 8.9–10.3)
Chloride: 103 mmol/L (ref 98–111)
Creatinine: 0.77 mg/dL (ref 0.44–1.00)
GFR, Estimated: >60 mL/min (ref >60)
Glucose, Bld: 102 mg/dL — ABNORMAL HIGH (ref 70–99)
Potassium: 4.6 mmol/L (ref 3.5–5.1)
Sodium: 139 mmol/L (ref 135–145)

## 2024-01-12 NOTE — Patient Instructions (Signed)
It was good to see you today.    I will see you for follow-up in 4 months.  As always, if you develop any new and concerning symptoms before your next visit, please call to see me sooner.

## 2024-01-12 NOTE — Progress Notes (Signed)
 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 The comment is that the adenocarcinomatous component is limited to rare foci of small invasive glandular structures. Another report states that there is a small focus of papillary adenocarcinoma in a serous cystadenofibroma of the left ovary. Uterus, cervix, left fallopian tube, right adnexa and pelvic washings all without evidence of malignancy. Appendix also removed at the time of surgery.  Her CA-125 at that time was reported to be 33. She did not receive adjuvant therapy for her initial cancer diagnosis.  She presented in 07/2013 with abdominal pain and mass was noted along right inferior rectus muscle. Biopsy of the mass showed adenocarcinoma of unknown primary. She received 3 cycles of neoadjuvant chemotherapy (no response), carboplatin /taxol . After resection, she underwent RT due to positie tumor margin. This was completed in 02/2014.  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 Lower abdominal/suprapubic ventral wall lobulated soft tissue  mass, arising within the lower rectus musculature more so on the  right. 5.5 x 7.9 x 9.7 cm. Favor sarcoma or other connective tissue  tumor. This should be amenable to percutaneous biopsy. In a younger  female patient, endometriosis within a Cesarean section scar would  also be a consideration for this appearance.  2. Narrow fat plane between the mass in #1 and  underlying small  bowel and bladder. No lymphadenopathy. No ascites.  3. Surgically absent gallbladder, uterus, and adnexa. Diverticulosis  of the colon.  Study discussed by telephone with PA BENJAMIN MANN on 07/19/2013 at  11:35 .    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 Microscopic Comment Needle core biopsies demonstrate diffuse involvement by well differentiated adenocarcinoma. The adenocarcinoma has the following immunophenotype: Cytokeratin 7 - strong diffuse expression Cytokeratin 20 - negative expression CDX2 - negative expression TTF-1 - negative expression Estrogen receptor - negative expression Vimentin - patchy mild to moderate expression   08/16/2013 - 09/27/2013 Chemotherapy   3 cycle of carboplatin , paclitaxel     10/11/2013 Imaging   CT A/P: A heterogeneous mass centered in the inferior right rectus abdominus  muscle measures 5.3 x 7.5 cm (previously 5.5 x 7.9 cm). No  pathologically enlarged lymph nodes. Scattered atherosclerotic  calcification of the arterial vasculature without abdominal aortic  aneurysm. No free fluid. No worrisome lytic or sclerotic lesions.  Degenerative changes are seen in the spine.   IMPRESSION:  Right rectus abdominus mass is stable to very minimally smaller than  on 07/19/2013.   11/05/2013 Surgery   Resection abdominal wall mass, abdominal wall reconstruction with Strattice matrix (10x11cm)  Operative findings: 8 involving the subcutaneous tissues, fascia, and rectus on the right side. No obvious intraperitoneal disease. Normal-appearing omentum   11/05/2013 Pathology Results   Soft tissue mass, simple excision, abdominal wall mass ADENOCARCINOMA. Microscopic Comment The specimen is extensively involved by adenocarcinoma which focally involves the  edge of the specimen. Immunohistochemistry  is performed and the tumor shows patchy positivity with Napsin-A and is negative with WT-1, estrogen receptor, progesterone receptor, gross disease cystic fluid protein, CDX-2, carcinoembryonic antigen, thyroid  transcription factor-1, CD10 and CD117. The immunophenotype is nonspecific and additional immunohistochemistry will be performed and reported as an addendum. (JDP:kh 11/07/13) ADDENDUM: Additional immunohistochemistry is performed and the tumor is positive with cytokeratin AE1/AE3 and cytokeratin 7 and is negative with Calretinin, cytokeratin 5/6 and cytokeratin 20. The immunoreactivity is not specific for location of a primary tumor. (JDP:kh 11-08-13)   11/05/2013 Genetic Testing   [facility] testing MYC amplification - equivocal CREBBP Q1765* No FDA approved therapies in patient's tumor type or another tumor type    01/16/2014 - 02/27/2014 Radiation Therapy   45 gray to the target region within the abdominal wall postoperative area. She received 45 gray using a 4 field 3-D conformal technique at 1.8 gray per fraction. The patient then received a boost using a cone down 4 field technique for an additional 9 Gray. The final dose was 54 gray.   01/29/2018 Pathology Results   Soft Tissue Needle Core Biopsy, ant abd wall mass - ADENOCARCINOMA. - SEE MICROSCOPIC DESCRIPTION. Microscopic Comment The core biopsies consist of abundant dense, hyalinized fibrous tissue with scattered glands with cytologic atypia consistent with adenocarcinoma. Immunohistochemistry shows the tumor is positive with cytokeratin AE1/AE3 and cytokeratin 7 and is negative with cytokeratin 5/6, Calretinin, WT-1, estrogen receptor, progesterone receptor and cytokeratin 20. The morphology and immunophenotype are similar to the morphology in the abdominal wall mass excision from 11/07/2013 [phone]).   03/01/2018 Imaging   PET: 1. Hypermetabolism associated with the cystic and  solid suprapubic mass compatible with the reported clinical history of adenocarcinoma recurrence. Cystic components in the cranial aspect of the lesion are largely devoid of FDG accumulation with the most hypermetabolic tissue being inferiorly, immediately adjacent to the symphysis pubis. 2. Focal uptake identified in the left L3-4 facets without underlying bony lesion. This is presumed to represent uptake related to degenerative change. 3. No other sites of unexpected or suspicious hypermetabolism in the neck, chest, abdomen, or pelvis   03/02/2018 Imaging   CT A/P: 6.7 cm mass in the lower anterior abdominal wall containing cystic and solid components, suspicious for recurrence of previously resected/treated adenocarcinoma in this region. This abuts and could invade the anterior wall of the bladder.   No evidence for more distant metastatic disease within the abdomen or pelvis.    03/28/2018 Surgery   Exploratory laparotomy, resection abdominal wall tumor, oversew of bladder peritoneum, cystoscopy.  Findings: normal external genitalia, unable to palpate mass on exam. After opening the fascia, a cystic and solid mass was encountered below the lower edge of the incision, extending into the space of retzius and adherent to the pubic arch inferiorly, the bladder dome posteriorly, and prior abdominal wall repair anteriorly. At conclusion of the case, no nodularity was palpated in the tumor bed. Visualized bowel and omentum were free of disease. Pelvic peritoenum appeared normal. Cystoscopy revealed intact bladder mucosa with no visible suture. Bilateral ureteral orfices visualized.   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 Additional immunohistochemical studies performed on block A5 in the current specimen demonstrates that the tumor is  positive for Napsin A and PAX-8, and is negative for TTF-1. 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   03/28/2018 Genetic Testing   [facility] No reportable alterations with companion diagnostic claims MS-stable, TMB - 4 Muts/Mb, CREBBP [id number]*   05/21/2018 - 09/08/2018 Chemotherapy   Patient is on Treatment Plan : OVARIAN Carboplatin /Gemcitabine  q21d      Pathology Results   A. SOFT TISSUE MASS, PELVIC BODY WALL, NEEDLE CORE BIOPSY:  - Metastatic adenocarcinoma, see comment.   COMMENT:   The tumor has malignant cells with cleared cytoplasm.  Immunohistochemistry is positive for cytokeratin 7, PAX8, racemase, and NapsinA. The cells are negative for ER, WT-1, and CD10. 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 Dr. Cloretta was notified on 01/08/20.    10/19/2018 Imaging   CT A/P: 1. Previously noted lower anterior abdominal wall mass is no longer identified, presumably surgically resected. Cephalad to the site of the prior mass within the low anterior abdominal wall there is some soft tissue thickening seen on today's examination (axial image 54 of series 2). This is nonspecific and will serve as a baseline for future follow-up examinations. 2. No signs of metastatic disease elsewhere in the abdomen or pelvis. 3. Colonic diverticulosis without evidence of acute diverticulitis at this time. 4. Aortic atherosclerosis. 5. Additional incidental findings, as above.   04/18/2019 Imaging   CT A/P: 1.  No evidence of recurrent or metastatic carcinoma within the abdomen or pelvis. 2. Colonic diverticulosis. No radiographic evidence of diverticulitis.   12/23/2019 Imaging   CT A/P: Within the ventral, midline abdominal wall soft tissue mass is identified anterior to the pubic symphysis measuring 5.2 x 3.3 by 3.4 cm (volume = 31 cm^3), image 59/5 and image 78/2. This is compared with 4.9 x 3.0 by 2.6 (volume = 20)cm IMPRESSION: 1. Stable to slight increase in size of ventral, midline abdominal wall soft tissue mass at the level of the pubic symphysis. 2. No signs of solid organ or nodal metastases. 3. Aortic atherosclerosis.   01/06/2020 Relapse/Recurrence   Anterior abdominal wall mass biopsy: A. SOFT TISSUE MASS, PELVIC BODY WALL, NEEDLE CORE BIOPSY:  - Metastatic adenocarcinoma, see comment.   COMMENT:   The tumor has malignant cells with cleared cytoplasm.  Immunohistochemistry is positive for cytokeratin 7, PAX8, racemase, and  NapsinA. The cells are negative for ER, WT-1, and CD10. The morphology  and immunophenotype are consistent with metastatic clear cell carcinoma  of gynecologic origin (by definition  high grade). Dr. Cloretta was  notified on 01/08/20.    02/06/2020 Surgery   Exlap with resection of pelvic tumor densely adherent to the anterior aspect of the pubic arch. Surgery at [facility]  nodular mass palpable, fixed to the pubic arch on exam. On dissection into the mons, an irregularly shaped and nodular tumor was implanted on the pubic arch. No evidence of disease on visualized bowel or omentum. The palpated anterior abdominal wall was free of disease. The bladder dome peritoneum was free of disease. Few soft nodules on the anterior wall peritoneum were ablated using the bovie. No palpable tumor on or surrounding the pubic arch following resection.   02/06/2020 Pathology Results   A: Pelvic cavity, pubic mons tumor, excision - Clear  cell carcinoma, received fragmented, 5.5  cm in aggregate (see comment) - Carcinoma involves multiple cauterized tissue edges  ER negative, PR 1+ (20%)   04/06/2020 Genetic Testing   Negative genetic testing on the TumorNext-[facility]CancerNext testing.  Somatic testing was negative and no germline mutations identified.  The CancerNext gene panel offered by [employer] includes sequencing and rearrangement analysis for the following 34 genes:   APC, ATM, BARD1, BMPR1A, BRCA1, BRCA2, BRIP1, CDH1, CDK4, CDKN2A, CHEK2, DICER1, HOXB13, EPCAM, GREM1, MLH1, MRE11A, MSH2, MSH6, MUTYH, NBN, NF1, PALB2, PMS2, POLD1, POLE, PTEN, RAD50, RAD51C, RAD51D, SMAD4, SMARCA4, STK11, and TP53.  The report date is April 06, 2020.   05/19/2020 Imaging   CT A/P: Interval resolution of suprapubic abdominal wall mass since prior study. No residual metastatic disease identified. Colonic diverticulosis, without radiographic evidence of diverticulitis. Moderate right and small left hernias.   11/26/2020 Imaging   11/26/20: Status post hysterectomy and bilateral salpingo oophorectomy. No evidence of recurrent or metastatic disease. Additional stable ancillary findings as above.   05/27/2021 Imaging   CT A/P: 1. Stable examination status post hysterectomy and bilateral salpingo-oophorectomy, without evidence of recurrent or metastatic disease within the abdomen or pelvis. 2. Moderate volume of formed stool throughout the colon suggestive of constipation. 3. Sigmoid colonic diverticulosis without findings of acute diverticulitis. 4.  Aortic Atherosclerosis (ICD10-I70.0).   02/28/2022 Imaging   CT A/P: 1. No acute intra-abdominal or pelvic pathology. No evidence of recurrent or metastatic disease. 2. Sigmoid diverticulosis. 3. Progression of broad-based herniation of the bowel into the anterior pelvic wall. No bowel obstruction. 4.  Aortic Atherosclerosis (ICD10-I70.0).   Adenocarcinoma of abdominal wall, unclear primary  08/09/2013 Initial  Diagnosis   Adenocarcinoma of abdominal wall, unclear primary   08/16/2013 - 09/25/2013 Chemotherapy   paclitaxel  and carboplatin    11/05/2013 Surgery   resection abdominal wall mass    - 02/27/2014 Radiation Therapy   Completed volume directed radiation   01/06/2020 Relapse/Recurrence   Anterior abdominal wall mass biopsy: A. SOFT TISSUE MASS, PELVIC BODY WALL, NEEDLE CORE BIOPSY:  - Metastatic adenocarcinoma, see comment.   COMMENT:   The tumor has malignant cells with cleared cytoplasm.  Immunohistochemistry is positive for cytokeratin 7, PAX8, racemase, and  NapsinA. The cells are negative for ER, WT-1, and CD10. The morphology  and immunophenotype are consistent with metastatic clear cell carcinoma  of gynecologic origin (by definition  high grade). Dr. Cloretta was  notified on 01/08/20.    02/06/2020 Surgery   Exlap with resection of pelvic tumor densely adherent to the anterior aspect of the pubic arch. Surgery at [facility]  nodular mass palpable, fixed to the pubic arch on exam. On dissection into the mons, an irregularly shaped and nodular tumor was implanted on the pubic arch. No evidence of disease on visualized bowel or omentum. The palpated anterior abdominal wall was free of disease. The bladder dome peritoneum was free of disease. Few soft nodules on the anterior wall peritoneum were ablated using the bovie. No palpable tumor on or surrounding the pubic arch following resection.   02/06/2020 Pathology Results   A: Pelvic cavity, pubic mons tumor, excision - Clear cell carcinoma, received fragmented, 5.5 cm in aggregate (see comment) - Carcinoma involves multiple cauterized tissue edges  ER negative, PR 1+ (20%)   Metastatic adenocarcinoma of ovary  05/03/2018 Initial Diagnosis   Malignant neoplasm of ovary (HCC)   05/21/2018 - 09/08/2018 Chemotherapy   Patient is on Treatment Plan : OVARIAN Carboplatin /Gemcitabine  q21d  Completed RT to left pubic mass in 01/2023,  received 50Gy in 10 Fx. CT A/P 03/27/23: new small nodule in RLQ deep to anterior abdominal wall, suspicious for peritoneal metastasis. Stable soft tissue nodule within suprapubic hernia anterior to R pubic symphysis. Unchanged mildly large left inguinal lymph node. Il-defined mass in medial adductor musculature less well defined and smaller, suggests response to tx. CT A/P 07/04/23: Similar appearance of soft tissue nodules within pelvis. Reponse to therapy of left pelvic adenopathy and metastasis within abductor muscle. Suprapubic hernia nonobstructive, stable.  Interval History: Doing well.  Denies any significant pain.  Has very infrequent discomfort in the suprapubic area.  Endorses regular bowel function.  Denies any urinary symptoms.  Has noted less irritation on her vulva with use of Vaseline.  Past Medical/Surgical History: Past Medical History:  Diagnosis Date   Anxiety    new dx   Atrial fibrillation (HCC)    Cancer (HCC) 1988, 2015   ovarian, adenocarcinoma   GERD (gastroesophageal reflux disease)    hx of years ago   Hx of radiation therapy 01/16/14-02/27/14   abdomen    Hypercholesteremia    under control with diet and fish oil   Hypertension    Ovarian cyst    PONV (postoperative nausea and vomiting)    Restless leg syndrome    Thyroid  disease     Past Surgical History:  Procedure Laterality Date   APPENDECTOMY     APPLICATION OF A-CELL OF CHEST/ABDOMEN N/A 11/05/2013   Procedure: ABDOMINAL WALL RESCONTRUCTION WITH STRATUS;  Surgeon: Earlis Ranks, MD;  Location: [facility];  Service: Plastics;  Laterality: N/A;   CHOLECYSTECTOMY  2009   LAPAROTOMY N/A 11/05/2013   Procedure: RESECTION OF PELVIC MASS;  Surgeon: Elenore A. Dodie, MD;  Location: [facility];  Service: Gynecology;  Laterality: N/A;   LAPAROTOMY  03/2018   excision of suprapubic mass at [facility]   OOPHORECTOMY     BSO   TONSILLECTOMY  as child   TOTAL ABDOMINAL HYSTERECTOMY  1988   ovarian cyst  BSO    Family  History  Problem Relation Age of Onset   Heart disease Mother    Heart disease Sister    Diabetes Sister    Cancer Sister        melanoma-skin cancer   Breast cancer Paternal Grandmother        Age 22's   Cancer Paternal Grandmother        breast   Cancer Paternal Grandfather        prostate/bladder   Colon cancer Neg Hx    Ovarian cancer Neg Hx    Endometrial cancer Neg Hx    Pancreatic cancer Neg Hx    Prostate cancer Neg Hx     Social History   Socioeconomic History   Marital status: Single    Spouse name: Not on file   Number of children: Not on file   Years of education: Not on file   Highest education level: Not on file  Occupational History   Not on file  Tobacco Use   Smoking status: Never   Smokeless tobacco: Never  Vaping Use   Vaping status: Never Used  Substance and Sexual Activity   Alcohol use: No    Alcohol/week: 0.0 standard drinks of alcohol   Drug use: No   Sexual activity: Never    Birth control/protection: Surgical    Comment: HYST  Other Topics Concern   Not on file  Social History Narrative  Single-never married   No children or pets   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   Enjoys reading, keeps house tidy, 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 at [employer]: Low Risk  (02/07/2020)   Received from [facility]   Overall Financial Resource Strain (CARDIA)    Difficulty of Paying Living Expenses: Not hard at all  Food Insecurity: No Food Insecurity (12/07/2022)   Hunger Vital Sign    Worried About Running Out of Food in the Last Year: Never true    Ran Out of Food in the Last Year: Never true  Transportation Needs: No Transportation Needs (12/07/2022)   PRAPARE - Administrator, Civil Service (Medical): No    Lack of Transportation (Non-Medical): No  Physical Activity: Not on file  Stress: Not on file  Social Connections: Not on file    Current  Medications:  Current Outpatient Medications:    ALPRAZolam  (XANAX ) 0.5 MG tablet, Take 0.5 mg by mouth at bedtime., Disp: , Rfl:    cephALEXin  (KEFLEX ) 500 MG capsule, Take 1 capsule (500 mg total) by mouth 2 (two) times daily., Disp: 10 capsule, Rfl: 0   cholecalciferol (VITAMIN D3) 25 MCG (1000 UNIT) tablet, Take 1,000 Units by mouth daily., Disp: , Rfl:    ELIQUIS  5 MG TABS tablet, TAKE (1) TABLET BY MOUTH TWICE DAILY., Disp: 180 tablet, Rfl: 1   estradiol  (ESTRACE  VAGINAL) 0.1 MG/GM vaginal cream, Finger tip amount of cream applied to vulva and outer vagina at night 3 x a week, Disp: 42.5 g, Rfl: 6   HYDROcodone -acetaminophen  (NORCO/VICODIN) 5-325 MG tablet, Take 1 tablet by mouth 3 (three) times daily as needed., Disp: , Rfl:    levothyroxine  (SYNTHROID ) 50 MCG tablet, Take 50 mcg by mouth daily., Disp: , Rfl:    metoprolol  tartrate (LOPRESSOR ) 25 MG tablet, TAKE (1/2) TABLET BY MOUTH TWICE DAILY., Disp: 90 tablet, Rfl: 1   Multiple Vitamin (MULTIVITAMIN WITH MINERALS) TABS tablet, Take 1 tablet by mouth daily., Disp: , Rfl:    pramipexole (MIRAPEX) 0.125 MG tablet, Take 0.125 mg by mouth 2 (two) times daily., Disp: , Rfl:   Review of Systems: Denies appetite changes, fevers, chills, fatigue, unexplained weight changes. Denies hearing loss, neck lumps or masses, mouth sores, ringing in ears or voice changes. Denies cough or wheezing.  Denies shortness of breath. Denies chest pain or palpitations. Denies leg swelling. Denies abdominal distention, pain, blood in stools, constipation, diarrhea, nausea, vomiting, or early satiety. Denies pain with intercourse, dysuria, frequency, hematuria or incontinence. Denies hot flashes, pelvic pain, vaginal bleeding or vaginal discharge.   Denies joint pain, back pain or muscle pain/cramps. Denies itching, rash, or wounds. Denies dizziness, headaches, numbness or seizures. Denies swollen lymph nodes or glands, denies easy bruising or  bleeding. Denies anxiety, depression, confusion, or decreased concentration.  Physical Exam: BP (!) 135/57 (BP Location: Left Arm, Patient Position: Sitting)   Pulse 66   Temp 97.9 F (36.6 C) (Oral)   Resp 20   Wt 128 lb 6.4 oz (58.2 kg)   SpO2 100%   BMI 20.72 kg/m  General: Alert, oriented, no acute distress. HEENT: Normocephalic, atraumatic, sclera anicteric. Chest: Clear to auscultation bilaterally.  No wheezes or rhonchi. Cardiovascular: Regular rate and rhythm, no murmurs. Abdomen: soft, nontender.  Normoactive bowel sounds.  No masses or hepatosplenomegaly appreciated.  Well-healed incisions.  Extremities: Grossly normal range of motion.  Warm, well perfused.  No edema bilaterally. Skin: No rashes or lesions noted. Lymphatics: No cervical, supraclavicular. Shotty adenopathy in left inguinal area. GU: Edema of the mons consistent with known hernias, right greater than left.  Hernia stable in size since last visit.  There is a palpable 3 cm mass along the right aspect of her mons right at the level of healed incision and a another firm mass measuring approximately 2 cm at the most dependent portion of her hernia involving the right labia majora.  Neither of these areas is tender with palpation.  There is essentially no erythema of her labia bilaterally.  Still an area with some petechia on the right inner labia.  Some areas where there appears to be irritation due to swelling from the hernia.  Loss of architecture of bilateral labia noted as well as atrophy. Bimanual exam with single digit reveals no masses or nodularity.    Laboratory & Radiologic Studies:          Component Ref Range & Units (hover) 11 d ago 3 mo ago 6 mo ago 7 mo ago 9 mo ago 11 mo ago 1 yr ago  Cancer Antigen (CA) 125 21.6 19.9 CM 19.0 CM 20.1 CM 26.3 CM 58.0 High  CM 32.4 C     CT A/P on 01/01/24: 1. Stable to mildly increased soft tissue nodularity along the lower anterior abdominal favoring malignancy. 2.  Lower anterior abdominal wall hernia/laxity containing loops of small bowel extending down into the pubic/anterior labial region, right greater than left. A small amount of the urinary bladder also extends out through the hernia. No findings of strangulation or obstruction. 3. Prominent stool throughout the colon favors constipation. 4. Sigmoid colon diverticulosis. 5. Mild cardiomegaly. 6. Lumbar spondylosis and degenerative disc disease contributing to moderate left foraminal impingement at L5-S1. 7.  Aortic Atherosclerosis (ICD10-I70.0).  Assessment & Plan: [name] is a 78 y.o. 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 Repeat imaging with new peritoneal met but otherwise treatment effect in RT field. Follow-up imaging with stable disease. [facility] One: no actionable target. HRP, MSS, TMB low. HER2+ (2021) by FISH   Overall doing well.  Reviewed recent CT scan, which shows stable to mild increase in various areas of disease that we have been following along her anterior abdominal wall and right lower pelvis.  She remains essentially asymptomatic.  Her decision was not to start systemic therapy with Enhertu but rather continue with close observation.  Given overall feeling quite good with good quality of life, I think it is reasonable to continue with close observation rather than to initiate treatment.   I will plan to see her back in 4 months.   Patient's vulvar irritation is significantly improved with the use of Vaseline regularly.  22 minutes of total time was spent for this patient encounter, including preparation, face-to-face counseling with the patient and coordination of care, and documentation of the encounter.  Comer Dollar, MD  Division of Gynecologic Oncology  Department of Obstetrics and Gynecology  [facility]

## 2024-01-29 ENCOUNTER — Inpatient Hospital Stay: Attending: Oncology | Admitting: Oncology

## 2024-01-29 VITALS — BP 104/58 | HR 60 | Temp 98.2°F | Resp 18 | Ht 66.0 in | Wt 130.0 lb

## 2024-01-29 DIAGNOSIS — I4891 Unspecified atrial fibrillation: Secondary | ICD-10-CM | POA: Insufficient documentation

## 2024-01-29 DIAGNOSIS — C569 Malignant neoplasm of unspecified ovary: Secondary | ICD-10-CM | POA: Diagnosis not present

## 2024-01-29 DIAGNOSIS — C579 Malignant neoplasm of female genital organ, unspecified: Secondary | ICD-10-CM | POA: Diagnosis not present

## 2024-01-29 DIAGNOSIS — Z87440 Personal history of urinary (tract) infections: Secondary | ICD-10-CM | POA: Insufficient documentation

## 2024-01-29 DIAGNOSIS — G2581 Restless legs syndrome: Secondary | ICD-10-CM | POA: Insufficient documentation

## 2024-01-29 NOTE — Progress Notes (Signed)
 [facility] OFFICE PROGRESS NOTE   Diagnosis: Gynecologic malignancy  INTERVAL HISTORY:   [name] returns as scheduled.  She feels well.  She has intermittent mild discomfort at the low abdomen.  She is not taking pain medication for this discomfort.   Objective:  Vital signs in last 24 hours:  Blood pressure (!) 104/58, pulse 60, temperature 98.2 F (36.8 C), temperature source Temporal, resp. rate 18, height 5' 6 (1.676 m), weight 130 lb (59 kg), SpO2 100%.     Lymphatics: No cervical, supraclavicular, axillary, or inguinal nodes Resp: Lungs clear bilaterally Cardio: Regular rate and rhythm GI: No hepatosplenomegaly, reducible hernias at the pubis and upper labia Vascular: No leg edema  Lab Results:  Lab Results  Component Value Date   WBC 6.0 12/28/2022   HGB 11.9 (L) 12/28/2022   HCT 36.7 12/28/2022   MCV 97.9 12/28/2022   PLT 249 12/28/2022   NEUTROABS 4.0 05/06/2020    CMP  Lab Results  Component Value Date   NA 139 01/12/2024   K 4.6 01/12/2024   CL 103 01/12/2024   CO2 30 01/12/2024   GLUCOSE 102 (H) 01/12/2024   BUN 16 01/12/2024   CREATININE 0.77 01/12/2024   CALCIUM 10.2 01/12/2024   PROT 6.6 05/25/2021   ALBUMIN 4.0 05/25/2021   AST 30 05/25/2021   ALT 31 05/25/2021   ALKPHOS 49 05/25/2021   BILITOT 0.3 05/25/2021   GFRNONAA >60 01/12/2024   GFRAA >60 12/25/2019    Lab Results  Component Value Date   CA125 10 03/17/2016    Lab Results  Component Value Date   INR 1.1 12/28/2022   LABPROT 14.0 12/28/2022    Imaging:  No results found.  Medications: I have reviewed the patient's current medications.   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   Elevated CA 125.   Cycle 1 Taxol /carboplatin  08/16/2013.   Cycle 2 Taxol /carboplatin  09/06/2013.   Cycle 3  Taxol /carboplatin  09/27/2013   CT 10/11/2013 with a stable abdominal wall mass. Status post resection of abdominal wall mass with abdominal wall reconstruction 11/05/2013. Final pathology showed adenocarcinoma. Specimen extensively involved with adenocarcinoma which focally involved the edge of the specimen. Immunostains and Foundation 1 testing were nonspecific for a primary tumor location. Adjuvant radiation to the abdominal wall 01/16/2014 through 02/27/2014 CT 01/15/2018 while in the emergency room for evaluation of back pain- midline abdominal wall mass above the pubic symphysis and adjacent to the bladder Ultrasound-guided biopsy of the abdominal wall mass 01/25/2018-adenocarcinoma similar to the 2015 biopsy PET scan 03/01/2018-hypermetabolism associated with a cystic and solid suprapubic mass.  Focal uptake identified in the left L3-4 facets without underlying bony lesion.  No other sites of unexpected or suspicious hypermetabolism in the neck, chest, abdomen or pelvis. Exploratory laparotomy, resection of abdominal wall tumor, oversew of bladder peritoneum, cystoscopy 03/28/2018 (findings included a cystic and solid mass encountered below the lower edge of the incision extending into the space of retzius and adherent to the pubic arch inferiorly, bladder dome posteriorly and prior abdominal wall repair anteriorly.  No nodularity palpated in the tumor bed.  Visualized bowel and omentum free of disease.  Pelvic peritoneum appeared normal).  A small amount of tumor on the periosteum was cauterized using a Bovie. Pathology on pelvic tumor-metastatic high-grade adenocarcinoma, favor clear-cell carcinoma of gynecologic origin, fragments up to 5.8 cm  in size; ER negative, PR positive, 1+ (weak staining) to focally 2+, 30-40%, MSI-stable, mutation burden-4,CREBBP alteration Cycle 1 gemcitabine /carboplatin  05/21/2018; day 8 held due to neutropenia Cycle 2 gemcitabine /carboplatin  06/11/2018 (day 8 gemcitabine   eliminated) Cycle 3 gemcitabine /carboplatin  07/06/2018 Cycle 4 gemcitabine /carboplatin  07/27/2018 (Udenyca  added) Cycle 5 gemcitabine /carboplatin  08/17/2018 Cycle 6 gemcitabine /carboplatin  09/07/2018  CT 10/19/2018- low anterior abdominal wall mass no longer identified, soft tissue thickening cephalad to the site of prior mass-nonspecific, no additional evidence of metastatic disease CT 04/18/2019-no evidence of recurrent or metastatic disease CT 12/23/2019-slight increase in size of ventral midline soft tissue mass near the pubis, no other evidence of disease progression Biopsy pelvic body wall soft tissue mass 01/06/2020-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, ER negative, HER2 (2+) positive by FISH Surgical excision of the soft tissue mass adherent to the pubis 02/06/2020-clear cell carcinoma, positive surgical margins Adjuvant radiation 03/25/2020- 04/29/2020 CT abdomen/pelvis 05/19/2020- resolution of suprapubic abdominal wall mass, no evidence of metastatic disease, moderate right and small left hernias CT abdomen/pelvis 11/26/2020-no recurrence of the lower pelvic soft tissue mass, small bilateral lower pelvic hernias, no evidence of recurrent or metastatic disease CT abdomen/pelvis 05/27/2021-no evidence of recurrent disease CT abdomen/pelvis 02/28/2022-no evidence of recurrent disease progressive bowel herniation in the anterior pelvic wall CT abdomen/pelvis 08/24/2022-no evidence of recurrent disease CT abdomen/pelvis 11/04/2022-mild increase in size of a soft tissue density at the inferior aspect of the suprapubic hernia and anterior margin of the right pubic bone PET 11/30/2022-hypermetabolic soft tissue mass at the anterior margin of the left inferior pubic ramus, hypermetabolic adjacent left inguinal node, no evidence of distant metastatic disease CT biopsy left soft tissue mass left pectineus muscle 12/28/2022-metastatic adenocarcinoma  morphologically consistent with patient's known history of clear-cell carcinoma of gynecologic origin, HRD negative, MSS, tumor mutation burden 7, CREBBP alteration Radiation to the left pubis mass 01/19/2023 - 02/01/2023, 50 Gray in 10 fractions CT abdomen/pelvis 03/27/2023-new small nodule in the right lower quadrant deep to the anterior abdominal wall, unchanged mildly enlarged left inguinal node, ill-defined mass in the medial adductor musculature on the left less well-defined CT abdomen/pelvis 07/04/2023: Stable pelvic soft tissue nodules, decreased left pelvic adenopathy and left abductor metastasis, no new site of metastatic disease, suprapubic hernia CT abdomen/pelvis 01/01/2024: Stable to mildly increased soft tissue nodularity of the low anterior abdomen, low anterior abdominal wall hernias extending to the pubic/anterior labial areas on the right greater than left  Ovarian cancer in 1988, low-grade adenocarcinoma with areas of borderline carcinoma, status post a hysterectomy and bilateral oophorectomy. New onset atrial fibrillation August 2016, maintained on eliquis  Recurrent urinary tract infections Restless legs        Disposition: Ms. Steinhaus has an indolent gynecologic malignancy, likely ovarian cancer.  I reviewed the restaging CT findings and images with [name].  The CTs are consistent with stable disease.  She does not have significant symptoms from the cancer at present.  The plan is to continue observation.  She will see Dr. Viktoria in December.  She will return for an office visit and CA125 in 6 months.  Arley Hof, MD  01/29/2024  11:31 AM

## 2024-01-31 ENCOUNTER — Telehealth: Payer: Self-pay | Admitting: Oncology

## 2024-01-31 NOTE — Telephone Encounter (Signed)
 Patient has been scheduled for follow-up visit per 01/29/24 LOS.  Pt noted appt details on personal planner/calendar.

## 2024-02-08 ENCOUNTER — Other Ambulatory Visit: Payer: Self-pay | Admitting: Cardiology

## 2024-03-06 DIAGNOSIS — G894 Chronic pain syndrome: Secondary | ICD-10-CM | POA: Diagnosis not present

## 2024-03-06 DIAGNOSIS — F1120 Opioid dependence, uncomplicated: Secondary | ICD-10-CM | POA: Diagnosis not present

## 2024-03-06 DIAGNOSIS — I480 Paroxysmal atrial fibrillation: Secondary | ICD-10-CM | POA: Diagnosis not present

## 2024-03-06 DIAGNOSIS — F411 Generalized anxiety disorder: Secondary | ICD-10-CM | POA: Diagnosis not present

## 2024-03-06 DIAGNOSIS — E785 Hyperlipidemia, unspecified: Secondary | ICD-10-CM | POA: Diagnosis not present

## 2024-03-06 DIAGNOSIS — G4700 Insomnia, unspecified: Secondary | ICD-10-CM | POA: Diagnosis not present

## 2024-03-06 DIAGNOSIS — G2581 Restless legs syndrome: Secondary | ICD-10-CM | POA: Diagnosis not present

## 2024-03-06 DIAGNOSIS — Z23 Encounter for immunization: Secondary | ICD-10-CM | POA: Diagnosis not present

## 2024-03-06 DIAGNOSIS — E039 Hypothyroidism, unspecified: Secondary | ICD-10-CM | POA: Diagnosis not present

## 2024-03-11 ENCOUNTER — Ambulatory Visit: Attending: Radiation Oncology | Admitting: Rehabilitation

## 2024-03-11 DIAGNOSIS — Z483 Aftercare following surgery for neoplasm: Secondary | ICD-10-CM | POA: Insufficient documentation

## 2024-03-11 NOTE — Therapy (Signed)
 OUTPATIENT PHYSICAL THERAPY SOZO SCREENING NOTE   Patient Name: [name] MRN: [id number] DOB:01/16/1946, 78 y.o., female Today's Date: 03/11/2024  PCP: Marvine Rush, MD REFERRING PROVIDER: Lanell Donald Stagger,*   PT End of Session - 03/11/24 1016     Visit Number 1   screen   PT Start Time 1015    PT Stop Time 1017    PT Time Calculation (min) 2 min    Activity Tolerance Patient tolerated treatment well    Behavior During Therapy [facility] for tasks assessed/performed          Past Medical History:  Diagnosis Date   Anxiety    new dx   Atrial fibrillation (HCC)    Cancer (HCC) 1988, 2015   ovarian, adenocarcinoma   GERD (gastroesophageal reflux disease)    hx of years ago   Hx of radiation therapy 01/16/14-02/27/14   abdomen    Hypercholesteremia    under control with diet and fish oil   Hypertension    Ovarian cyst    PONV (postoperative nausea and vomiting)    Restless leg syndrome    Thyroid  disease    Past Surgical History:  Procedure Laterality Date   APPENDECTOMY     APPLICATION OF A-CELL OF CHEST/ABDOMEN N/A 11/05/2013   Procedure: ABDOMINAL WALL RESCONTRUCTION WITH STRATUS;  Surgeon: Earlis Ranks, MD;  Location: [facility]S;  Service: Plastics;  Laterality: N/A;   CHOLECYSTECTOMY  2009   LAPAROTOMY N/A 11/05/2013   Procedure: RESECTION OF PELVIC MASS;  Surgeon: Elenore A. Dodie, MD;  Location: [facility];  Service: Gynecology;  Laterality: N/A;   LAPAROTOMY  03/2018   excision of suprapubic mass at [facility]   OOPHORECTOMY     BSO   TONSILLECTOMY  as child   TOTAL ABDOMINAL HYSTERECTOMY  1988   ovarian cyst  BSO   Patient Active Problem List   Diagnosis Date Noted   Incisional hernia, without obstruction or gangrene 08/30/2021   Genetic testing 04/06/2020   Hypothyroidism 02/03/2020   Restless leg syndrome 02/03/2020   Atrial fibrillation (HCC) 01/13/2015   Chest pain 01/13/2015   Ovarian ca (HCC) 11/05/2013   Metastatic adenocarcinoma of ovary  08/20/2013   Abdominal wall mass of suprapubic region 08/20/2013   Adenocarcinoma of abdominal wall, unclear primary 08/09/2013   Neoplasm of abdominal wall of uncertain behavior 07/23/2013    REFERRING DIAG: right breast cancer at risk for lymphedema  THERAPY DIAG:  Aftercare following surgery for neoplasm  PERTINENT HISTORY: Diagnosed 1988 at time of hysterectomy with carcinoma of left ovary. In 2015 diagnosed with abdominal wall reoccurence in right rectus muscle and she had 3 rounds of chemo with no response. 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  She received carboplatin  and Gemzar  therapy. In August 2021 there appeared to be recurrence at the pubic symphysis of the soft tissue mass measuring  and repeat biopsy showed metastatic carcinoma consistent with metastatic clear cell carcinoma on 02/06/2020 she underwent exploratory laparotomy with resection of tumor densely adherent to the anterior aspect of the pubic arch at [facility] and a 5.5 cm aggregate clear cell carcinoma was identified, and carcinoma  Her tumor was ER negative PR 1+20%. She went on to receive palliative radiotherapy which she completed in December 2021 to the tumor within and involving the pelvis. A PET  on 11/30/22 showed hypermetabolic activity in the anterior margin of the left inferior pubic ramus and left inguinal node.  Dr. Dewey would like to biopsy a groin LN to be sure it is recurrent disease, and if so 2-5 weeks of radiation.She's seen to consider additional radiotherapy to the pelvis and left groin region.   PRECAUTIONS: right UE Lymphedema risk, None  SUBJECTIVE: Pt returns for her 3 month L-Dex screen.   PAIN:  Are you having pain? No  SOZO SCREENING: Patient was assessed  today using the SOZO machine to determine the lymphedema index score. This was compared to her baseline score. It was determined that she is within the recommended range when compared to her baseline and no further action is needed at this time. She will continue SOZO screenings. These are done every 3 months for 2 years post operatively followed by every 6 months for 2 years, and then annually.   L-DEX FLOWSHEETS - 03/11/24 1000       L-DEX LYMPHEDEMA SCREENING   Measurement Type Unilateral    L-DEX MEASUREMENT EXTREMITY Lower Extremity    POSITION  Standing    DOMINANT SIDE Right    At Risk Side Right    BASELINE SCORE (UNILATERAL) -7.1    L-DEX SCORE (UNILATERAL) -10.4    VALUE CHANGE (UNILAT) -3.3            Lynna Zamorano R, PT 03/11/2024, 10:17 AM

## 2024-05-24 ENCOUNTER — Inpatient Hospital Stay

## 2024-05-24 ENCOUNTER — Encounter: Payer: Self-pay | Admitting: Gynecologic Oncology

## 2024-05-24 ENCOUNTER — Inpatient Hospital Stay: Attending: Gynecologic Oncology | Admitting: Gynecologic Oncology

## 2024-05-24 VITALS — BP 121/55 | HR 55 | Temp 97.8°F | Resp 18 | Wt 127.6 lb

## 2024-05-24 DIAGNOSIS — K432 Incisional hernia without obstruction or gangrene: Secondary | ICD-10-CM

## 2024-05-24 DIAGNOSIS — Z8543 Personal history of malignant neoplasm of ovary: Secondary | ICD-10-CM | POA: Diagnosis not present

## 2024-05-24 DIAGNOSIS — Z9221 Personal history of antineoplastic chemotherapy: Secondary | ICD-10-CM | POA: Insufficient documentation

## 2024-05-24 DIAGNOSIS — Z9079 Acquired absence of other genital organ(s): Secondary | ICD-10-CM | POA: Diagnosis not present

## 2024-05-24 DIAGNOSIS — Z8603 Personal history of neoplasm of uncertain behavior: Secondary | ICD-10-CM | POA: Diagnosis not present

## 2024-05-24 DIAGNOSIS — C801 Malignant (primary) neoplasm, unspecified: Secondary | ICD-10-CM

## 2024-05-24 DIAGNOSIS — N9089 Other specified noninflammatory disorders of vulva and perineum: Secondary | ICD-10-CM | POA: Insufficient documentation

## 2024-05-24 DIAGNOSIS — Z90722 Acquired absence of ovaries, bilateral: Secondary | ICD-10-CM | POA: Insufficient documentation

## 2024-05-24 DIAGNOSIS — Z90710 Acquired absence of both cervix and uterus: Secondary | ICD-10-CM | POA: Diagnosis not present

## 2024-05-24 DIAGNOSIS — Z923 Personal history of irradiation: Secondary | ICD-10-CM | POA: Diagnosis not present

## 2024-05-24 DIAGNOSIS — M25552 Pain in left hip: Secondary | ICD-10-CM | POA: Diagnosis not present

## 2024-05-24 LAB — CMP (CANCER CENTER ONLY)
ALT: 25 U/L (ref 0–44)
AST: 29 U/L (ref 15–41)
Albumin: 4.3 g/dL (ref 3.5–5.0)
Alkaline Phosphatase: 62 U/L (ref 38–126)
Anion gap: 8 (ref 5–15)
BUN: 20 mg/dL (ref 8–23)
CO2: 29 mmol/L (ref 22–32)
Calcium: 10.5 mg/dL — ABNORMAL HIGH (ref 8.9–10.3)
Chloride: 103 mmol/L (ref 98–111)
Creatinine: 0.72 mg/dL (ref 0.44–1.00)
GFR, Estimated: >60 mL/min
Glucose, Bld: 111 mg/dL — ABNORMAL HIGH (ref 70–99)
Potassium: 5.2 mmol/L — ABNORMAL HIGH (ref 3.5–5.1)
Sodium: 141 mmol/L (ref 135–145)
Total Bilirubin: 0.3 mg/dL (ref 0.0–1.2)
Total Protein: 7 g/dL (ref 6.5–8.1)

## 2024-05-24 NOTE — Addendum Note (Signed)
 Addended by: Nupur Hohman M on: 05/24/2024 02:55 PM   Modules accepted: Orders

## 2024-05-24 NOTE — Progress Notes (Signed)
 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 Pathology report says focal well differentiated serous cystadenocarcinoma. The comment is that the adenocarcinomatous component is limited to rare foci of small invasive glandular structures. Another report states that there is a small focus of papillary adenocarcinoma in a serous cystadenofibroma of the left ovary. Uterus, cervix, left fallopian tube, right adnexa and pelvic washings all without evidence of malignancy. Appendix also removed at the time of surgery.  Her CA-125 at that time was reported to be 33. She did not receive adjuvant therapy for her initial cancer diagnosis.  She presented in 07/2013 with abdominal pain and mass was noted along right inferior rectus muscle. Biopsy of the mass showed adenocarcinoma of unknown primary. She received 3 cycles of neoadjuvant chemotherapy (no response), carboplatin /taxol . After resection, she underwent RT due to positie tumor margin. This was completed in 02/2014.  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 Lower abdominal/suprapubic ventral wall lobulated soft tissue  mass, arising within the lower rectus musculature more so on the  right. 5.5 x 7.9 x 9.7 cm. Favor sarcoma or other connective tissue  tumor. This should be amenable to percutaneous biopsy. In a younger  female patient, endometriosis within a Cesarean section scar would  also be a consideration for this appearance.  2. Narrow fat plane between the mass in #1 and  underlying small  bowel and bladder. No lymphadenopathy. No ascites.  3. Surgically absent gallbladder, uterus, and adnexa. Diverticulosis  of the colon.  Study discussed by telephone with PA BENJAMIN MANN on 07/19/2013 at  11:35 .    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 Microscopic Comment Needle core biopsies demonstrate diffuse involvement by well differentiated adenocarcinoma. The adenocarcinoma has the following immunophenotype: Cytokeratin 7 - strong diffuse expression Cytokeratin 20 - negative expression CDX2 - negative expression TTF-1 - negative expression Estrogen receptor - negative expression Vimentin - patchy mild to moderate expression   08/16/2013 - 09/27/2013 Chemotherapy   3 cycle of carboplatin , paclitaxel     10/11/2013 Imaging   CT A/P: A heterogeneous mass centered in the inferior right rectus abdominus  muscle measures 5.3 x 7.5 cm (previously 5.5 x 7.9 cm). No  pathologically enlarged lymph nodes. Scattered atherosclerotic  calcification of the arterial vasculature without abdominal aortic  aneurysm. No free fluid. No worrisome lytic or sclerotic lesions.  Degenerative changes are seen in the spine.   IMPRESSION:  Right rectus abdominus mass is stable to very minimally smaller than  on 07/19/2013.   11/05/2013 Surgery   Resection abdominal wall mass, abdominal wall reconstruction with Strattice matrix (10x11cm)  Operative findings: 8 involving the subcutaneous tissues, fascia, and rectus on the right side. No obvious intraperitoneal disease. Normal-appearing omentum   11/05/2013 Pathology Results   Soft tissue mass, simple excision, abdominal wall mass ADENOCARCINOMA. Microscopic Comment The specimen is extensively involved by adenocarcinoma which focally involves the  edge of the specimen. Immunohistochemistry is  performed and the tumor shows patchy positivity with Napsin-A and is negative with WT-1, estrogen receptor, progesterone receptor, gross disease cystic fluid protein, CDX-2, carcinoembryonic antigen, thyroid  transcription factor-1, CD10 and CD117. The immunophenotype is nonspecific and additional immunohistochemistry will be performed and reported as an addendum. (JDP:kh 11/07/13) ADDENDUM: Additional immunohistochemistry is performed and the tumor is positive with cytokeratin AE1/AE3 and cytokeratin 7 and is negative with Calretinin, cytokeratin 5/6 and cytokeratin 20. The immunoreactivity is not specific for location of a primary tumor. (JDP:kh 11-08-13)   11/05/2013 Genetic Testing   [facility] testing MYC amplification - equivocal CREBBP Q1765* No FDA approved therapies in patient's tumor type or another tumor type    01/16/2014 - 02/27/2014 Radiation Therapy   45 gray to the target region within the abdominal wall postoperative area. She received 45 gray using a 4 field 3-D conformal technique at 1.8 gray per fraction. The patient then received a boost using a cone down 4 field technique for an additional 9 Gray. The final dose was 54 gray.   01/29/2018 Pathology Results   Soft Tissue Needle Core Biopsy, ant abd wall mass - ADENOCARCINOMA. - SEE MICROSCOPIC DESCRIPTION. Microscopic Comment The core biopsies consist of abundant dense, hyalinized fibrous tissue with scattered glands with cytologic atypia consistent with adenocarcinoma. Immunohistochemistry shows the tumor is positive with cytokeratin AE1/AE3 and cytokeratin 7 and is negative with cytokeratin 5/6, Calretinin, WT-1, estrogen receptor, progesterone receptor and cytokeratin 20. The morphology and immunophenotype are similar to the morphology in the abdominal wall mass excision from 11/07/2013 [phone]).   03/01/2018 Imaging   PET: 1. Hypermetabolism associated with the cystic and  solid suprapubic mass compatible with the reported clinical history of adenocarcinoma recurrence. Cystic components in the cranial aspect of the lesion are largely devoid of FDG accumulation with the most hypermetabolic tissue being inferiorly, immediately adjacent to the symphysis pubis. 2. Focal uptake identified in the left L3-4 facets without underlying bony lesion. This is presumed to represent uptake related to degenerative change. 3. No other sites of unexpected or suspicious hypermetabolism in the neck, chest, abdomen, or pelvis   03/02/2018 Imaging   CT A/P: 6.7 cm mass in the lower anterior abdominal wall containing cystic and solid components, suspicious for recurrence of previously resected/treated adenocarcinoma in this region. This abuts and could invade the anterior wall of the bladder.   No evidence for more distant metastatic disease within the abdomen or pelvis.    03/28/2018 Surgery   Exploratory laparotomy, resection abdominal wall tumor, oversew of bladder peritoneum, cystoscopy.  Findings: normal external genitalia, unable to palpate mass on exam. After opening the fascia, a cystic and solid mass was encountered below the lower edge of the incision, extending into the space of retzius and adherent to the pubic arch inferiorly, the bladder dome posteriorly, and prior abdominal wall repair anteriorly. At conclusion of the case, no nodularity was palpated in the tumor bed. Visualized bowel and omentum were free of disease. Pelvic peritoenum appeared normal. Cystoscopy revealed intact bladder mucosa with no visible suture. Bilateral ureteral orfices visualized.   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 Additional immunohistochemical studies performed on block A5 in the current specimen demonstrates that the tumor is positive  for Napsin A and PAX-8, and is negative for TTF-1. 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   03/28/2018 Genetic Testing   [facility] No reportable alterations with companion diagnostic claims MS-stable, TMB - 4 Muts/Mb, CREBBP [id number]*   05/21/2018 - 09/08/2018 Chemotherapy   Patient is on Treatment Plan : OVARIAN Carboplatin /Gemcitabine  q21d      Pathology Results   A. SOFT TISSUE MASS, PELVIC BODY WALL, NEEDLE CORE BIOPSY:  - Metastatic adenocarcinoma, see comment.   COMMENT:   The tumor has malignant cells with cleared cytoplasm.  Immunohistochemistry is positive for cytokeratin 7, PAX8, racemase, and NapsinA. The cells are negative for ER, WT-1, and CD10. 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 Dr. Cloretta was notified on 01/08/20.    10/19/2018 Imaging   CT A/P: 1. Previously noted lower anterior abdominal wall mass is no longer identified, presumably surgically resected. Cephalad to the site of the prior mass within the low anterior abdominal wall there is some soft tissue thickening seen on today's examination (axial image 54 of series 2). This is nonspecific and will serve as a baseline for future follow-up examinations. 2. No signs of metastatic disease elsewhere in the abdomen or pelvis. 3. Colonic diverticulosis without evidence of acute diverticulitis at this time. 4. Aortic atherosclerosis. 5. Additional incidental findings, as above.   04/18/2019 Imaging   CT A/P: 1.  No evidence of recurrent or metastatic carcinoma within the abdomen or pelvis. 2. Colonic diverticulosis. No radiographic evidence of diverticulitis.   12/23/2019 Imaging   CT A/P: Within the ventral, midline abdominal wall soft tissue mass is identified anterior to the pubic symphysis measuring 5.2 x 3.3 by 3.4 cm (volume = 31 cm^3), image 59/5 and image 78/2. This is compared with 4.9 x 3.0 by 2.6 (volume = 20)cm IMPRESSION: 1. Stable to slight increase in size of ventral, midline abdominal wall soft tissue mass at the level of the pubic symphysis. 2. No signs of solid organ or nodal metastases. 3. Aortic atherosclerosis.   01/06/2020 Relapse/Recurrence   Anterior abdominal wall mass biopsy: A. SOFT TISSUE MASS, PELVIC BODY WALL, NEEDLE CORE BIOPSY:  - Metastatic adenocarcinoma, see comment.   COMMENT:   The tumor has malignant cells with cleared cytoplasm.  Immunohistochemistry is positive for cytokeratin 7, PAX8, racemase, and  NapsinA. The cells are negative for ER, WT-1, and CD10. The morphology  and immunophenotype are consistent with metastatic clear cell carcinoma  of gynecologic origin (by definition  high grade). Dr. Cloretta was  notified on 01/08/20.    02/06/2020 Surgery   Exlap with resection of pelvic tumor densely adherent to the anterior aspect of the pubic arch. Surgery at [facility]  nodular mass palpable, fixed to the pubic arch on exam. On dissection into the mons, an irregularly shaped and nodular tumor was implanted on the pubic arch. No evidence of disease on visualized bowel or omentum. The palpated anterior abdominal wall was free of disease. The bladder dome peritoneum was free of disease. Few soft nodules on the anterior wall peritoneum were ablated using the bovie. No palpable tumor on or surrounding the pubic arch following resection.   02/06/2020 Pathology Results   A: Pelvic cavity, pubic mons tumor, excision - Clear cell  carcinoma, received fragmented, 5.5  cm in aggregate (see comment) - Carcinoma involves multiple cauterized tissue edges  ER negative, PR 1+ (20%)   04/06/2020 Genetic Testing   Negative genetic testing on the TumorNext-[facility]CancerNext testing.  Somatic testing was negative and no germline mutations identified.  The CancerNext gene panel offered by [employer] includes sequencing and rearrangement analysis for the following 34 genes:   APC, ATM, BARD1, BMPR1A, BRCA1, BRCA2, BRIP1, CDH1, CDK4, CDKN2A, CHEK2, DICER1, HOXB13, EPCAM, GREM1, MLH1, MRE11A, MSH2, MSH6, MUTYH, NBN, NF1, PALB2, PMS2, POLD1, POLE, PTEN, RAD50, RAD51C, RAD51D, SMAD4, SMARCA4, STK11, and TP53.  The report date is April 06, 2020.   05/19/2020 Imaging   CT A/P: Interval resolution of suprapubic abdominal wall mass since prior study. No residual metastatic disease identified. Colonic diverticulosis, without radiographic evidence of diverticulitis. Moderate right and small left hernias.   11/26/2020 Imaging   11/26/20: Status post hysterectomy and bilateral salpingo oophorectomy. No evidence of recurrent or metastatic disease. Additional stable ancillary findings as above.   05/27/2021 Imaging   CT A/P: 1. Stable examination status post hysterectomy and bilateral salpingo-oophorectomy, without evidence of recurrent or metastatic disease within the abdomen or pelvis. 2. Moderate volume of formed stool throughout the colon suggestive of constipation. 3. Sigmoid colonic diverticulosis without findings of acute diverticulitis. 4.  Aortic Atherosclerosis (ICD10-I70.0).   02/28/2022 Imaging   CT A/P: 1. No acute intra-abdominal or pelvic pathology. No evidence of recurrent or metastatic disease. 2. Sigmoid diverticulosis. 3. Progression of broad-based herniation of the bowel into the anterior pelvic wall. No bowel obstruction. 4.  Aortic Atherosclerosis (ICD10-I70.0).   Adenocarcinoma of abdominal wall, unclear primary  08/09/2013 Initial  Diagnosis   Adenocarcinoma of abdominal wall, unclear primary   08/16/2013 - 09/25/2013 Chemotherapy   paclitaxel  and carboplatin    11/05/2013 Surgery   resection abdominal wall mass    - 02/27/2014 Radiation Therapy   Completed volume directed radiation   01/06/2020 Relapse/Recurrence   Anterior abdominal wall mass biopsy: A. SOFT TISSUE MASS, PELVIC BODY WALL, NEEDLE CORE BIOPSY:  - Metastatic adenocarcinoma, see comment.   COMMENT:   The tumor has malignant cells with cleared cytoplasm.  Immunohistochemistry is positive for cytokeratin 7, PAX8, racemase, and  NapsinA. The cells are negative for ER, WT-1, and CD10. The morphology  and immunophenotype are consistent with metastatic clear cell carcinoma  of gynecologic origin (by definition  high grade). Dr. Cloretta was  notified on 01/08/20.    02/06/2020 Surgery   Exlap with resection of pelvic tumor densely adherent to the anterior aspect of the pubic arch. Surgery at [facility]  nodular mass palpable, fixed to the pubic arch on exam. On dissection into the mons, an irregularly shaped and nodular tumor was implanted on the pubic arch. No evidence of disease on visualized bowel or omentum. The palpated anterior abdominal wall was free of disease. The bladder dome peritoneum was free of disease. Few soft nodules on the anterior wall peritoneum were ablated using the bovie. No palpable tumor on or surrounding the pubic arch following resection.   02/06/2020 Pathology Results   A: Pelvic cavity, pubic mons tumor, excision - Clear cell carcinoma, received fragmented, 5.5 cm in aggregate (see comment) - Carcinoma involves multiple cauterized tissue edges  ER negative, PR 1+ (20%)   Metastatic adenocarcinoma of ovary  05/03/2018 Initial Diagnosis   Malignant neoplasm of ovary (HCC)   05/21/2018 - 09/08/2018 Chemotherapy   Patient is on Treatment Plan : OVARIAN Carboplatin /Gemcitabine  q21d  Interval History: Doing well overall.   Thinks that hernias have become slightly bigger.  Vulvar irritation seems to be well-controlled, using only petroleum/Vaseline.  Endorses normal bowel movements.  Denies any urinary symptoms.  Has occasional left-sided hip pain.  Has a good appetite although is eating less because she does not like her cooking.  Past Medical/Surgical History: Past Medical History:  Diagnosis Date   Anxiety    new dx   Atrial fibrillation (HCC)    Cancer (HCC) 1988, 2015   ovarian, adenocarcinoma   GERD (gastroesophageal reflux disease)    hx of years ago   Hx of radiation therapy 01/16/14-02/27/14   abdomen    Hypercholesteremia    under control with diet and fish oil   Hypertension    Ovarian cyst    PONV (postoperative nausea and vomiting)    Restless leg syndrome    Thyroid  disease     Past Surgical History:  Procedure Laterality Date   APPENDECTOMY     APPLICATION OF A-CELL OF CHEST/ABDOMEN N/A 11/05/2013   Procedure: ABDOMINAL WALL RESCONTRUCTION WITH STRATUS;  Surgeon: Earlis Ranks, MD;  Location: [facility];  Service: Plastics;  Laterality: N/A;   CHOLECYSTECTOMY  2009   LAPAROTOMY N/A 11/05/2013   Procedure: RESECTION OF PELVIC MASS;  Surgeon: Elenore A. Dodie, MD;  Location: [facility];  Service: Gynecology;  Laterality: N/A;   LAPAROTOMY  03/2018   excision of suprapubic mass at [facility]   OOPHORECTOMY     BSO   TONSILLECTOMY  as child   TOTAL ABDOMINAL HYSTERECTOMY  1988   ovarian cyst  BSO    Family History  Problem Relation Age of Onset   Heart disease Mother    Heart disease Sister    Diabetes Sister    Cancer Sister        melanoma-skin cancer   Breast cancer Paternal Grandmother        Age 93's   Cancer Paternal Grandmother        breast   Cancer Paternal Grandfather        prostate/bladder   Colon cancer Neg Hx    Ovarian cancer Neg Hx    Endometrial cancer Neg Hx    Pancreatic cancer Neg Hx    Prostate cancer Neg Hx     Social History   Socioeconomic History    Marital status: Single    Spouse name: Not on file   Number of children: Not on file   Years of education: Not on file   Highest education level: Not on file  Occupational History   Not on file  Tobacco Use   Smoking status: Never   Smokeless tobacco: Never  Vaping Use   Vaping status: Never Used  Substance and Sexual Activity   Alcohol use: No    Alcohol/week: 0.0 standard drinks of alcohol   Drug use: No   Sexual activity: Never    Birth control/protection: Surgical    Comment: HYST  Other Topics Concern   Not on file  Social History Narrative   Single-never married   No children or pets   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   Enjoys reading, keeps house tidy, plays in bell choir at [employer]   Tobacco Use: Low Risk (01/12/2024)   Patient History    Smoking Tobacco Use: Never    Smokeless Tobacco Use: Never    Passive Exposure: Not on file  Financial  Resource Strain: Not on file  Food Insecurity: No Food Insecurity (12/07/2022)   Hunger Vital Sign    Worried About Running Out of Food in the Last Year: Never true    Ran Out of Food in the Last Year: Never true  Transportation Needs: No Transportation Needs (12/07/2022)   PRAPARE - Administrator, Civil Service (Medical): No    Lack of Transportation (Non-Medical): No  Physical Activity: Not on file  Stress: Not on file  Social Connections: Not on file  Depression (PHQ2-9): Low Risk (01/29/2024)   Depression (PHQ2-9)    PHQ-2 Score: 0  Alcohol Screen: Not on file  Housing: Low Risk (12/07/2022)   Housing    Last Housing Risk Score: 0  Utilities: Not At Risk (12/07/2022)   AHC Utilities    Threatened with loss of utilities: No  Health Literacy: Not on file    Current Medications: Current Medications[1]  Review of Systems: Denies appetite changes, fevers, chills, fatigue, unexplained weight changes. Denies hearing loss, neck lumps or  masses, mouth sores, ringing in ears or voice changes. Denies cough or wheezing.  Denies shortness of breath. Denies chest pain or palpitations. Denies leg swelling. Denies abdominal distention, pain, blood in stools, constipation, diarrhea, nausea, vomiting, or early satiety. Denies pain with intercourse, dysuria, frequency, hematuria or incontinence. Denies hot flashes, pelvic pain, vaginal bleeding or vaginal discharge.   Denies joint pain, back pain or muscle pain/cramps. Denies itching, rash, or wounds. Denies dizziness, headaches, numbness or seizures. Denies swollen lymph nodes or glands, denies easy bruising or bleeding. Denies anxiety, depression, confusion, or decreased concentration.  Physical Exam: BP (!) 121/55 (BP Location: Left Arm, Patient Position: Sitting)   Pulse (!) 55   Temp 97.8 F (36.6 C) (Oral)   Resp 18   Wt 127 lb 9.6 oz (57.9 kg)   SpO2 100%   BMI 20.60 kg/m  General: Alert, oriented, no acute distress. HEENT: Normocephalic, atraumatic, sclera anicteric. Chest: Clear to auscultation bilaterally.  No wheezes or rhonchi. Cardiovascular: Regular rate and rhythm, no murmurs. Abdomen: soft, nontender.  Normoactive bowel sounds.  No masses or hepatosplenomegaly appreciated.  Well-healed incisions.  Extremities: Grossly normal range of motion.  Warm, well perfused.  No edema bilaterally. Skin: No rashes or lesions noted. Lymphatics: No cervical, supraclavicular. Shotty adenopathy in left inguinal area. GU: Edema of the mons consistent with known hernias, right greater than left.  Hernia stable in size since last visit (right vulva slightly bigger and left vulva almost normal in size today).  There is a palpable 3 cm mass along the right aspect of her mons right at the level of healed incision and a another firm mass measuring approximately 2 cm lateral and just superior on the right to this.  There is also an approximately 2 cm firm nodule at the most dependent  portion of her hernia involving the right labia majora.  None of these areas is tender with palpation.  There is essentially no erythema of her labia bilaterally.  Still an area with some petechia on the right inner labia.  Some areas where there appears to be irritation due to swelling from the hernia.  Loss of architecture of bilateral labia noted as well as atrophy. Bimanual exam with single digit reveals no masses or nodularity.    Laboratory & Radiologic Studies: 01/12/24: CT A/P 1. Stable to mildly increased soft tissue nodularity along the lower anterior abdominal favoring malignancy. 2. Lower anterior abdominal wall hernia/laxity containing loops  of small bowel extending down into the pubic/anterior labial region, right greater than left. A small amount of the urinary bladder also extends out through the hernia. No findings of strangulation or obstruction. 3. Prominent stool throughout the colon favors constipation. 4. Sigmoid colon diverticulosis. 5. Mild cardiomegaly. 6. Lumbar spondylosis and degenerative disc disease contributing to moderate left foraminal impingement at L5-S1. 7.  Aortic Atherosclerosis (ICD10-I70.0).           Component Ref Range & Units (hover) 4 mo ago (01/01/24) 7 mo ago (10/12/23) 10 mo ago (07/04/23) 1 yr ago (05/18/23) 1 yr ago (03/31/23) 1 yr ago (02/09/23) 1 yr ago (12/29/22)  Cancer Antigen (CA) 125 21.6 19.9 CM 19.0 CM 20.1 CM 26.3 CM 58.0 High  CM 32.4      Assessment & Plan: [name] is a 79 y.o. 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 Repeat imaging with new peritoneal met but otherwise treatment effect in RT field. Follow-up imaging with stable disease. Foundation One: no actionable target. HRP, MSS, TMB low. HER2+ (2021) by FISH   Overall doing well.  Only new symptom is some occasional and mild left hip pain.  Will evaluate hip bone on upcoming CT  scan and get additional imaging of the bone as needed.  Last CT scan shows stable to mild increase in various areas of disease that we have been following along her anterior abdominal wall and right lower pelvis.  She remains essentially asymptomatic.  Given more than 3 months since her last scan, discussed scheduling CT scan for later this month.  Order placed today.  We will also get a CA125 today.   Her decision previously was not to start systemic therapy with Enhertu but rather continue with close observation.  Given overall feeling quite good with good quality of life, I think it is reasonable to continue with close observation rather than to initiate treatment although we can reevaluate after her next imaging.   I will plan to see her back in 4 months.   Patient's vulvar irritation is significantly improved with the use of Vaseline regularly.  20 minutes of total time was spent for this patient encounter, including preparation, face-to-face counseling with the patient and coordination of care, and documentation of the encounter.  Comer Dollar, MD  Division of Gynecologic Oncology  Department of Obstetrics and Gynecology  [facility]      [1]  Current Outpatient Medications:    ALPRAZolam  (XANAX ) 0.5 MG tablet, Take 0.5 mg by mouth at bedtime., Disp: , Rfl:    cholecalciferol (VITAMIN D3) 25 MCG (1000 UNIT) tablet, Take 1,000 Units by mouth daily., Disp: , Rfl:    ELIQUIS  5 MG TABS tablet, TAKE (1) TABLET BY MOUTH TWICE DAILY., Disp: 180 tablet, Rfl: 1   estradiol  (ESTRACE  VAGINAL) 0.1 MG/GM vaginal cream, Finger tip amount of cream applied to vulva and outer vagina at night 3 x a week, Disp: 42.5 g, Rfl: 6   HYDROcodone -acetaminophen  (NORCO/VICODIN) 5-325 MG tablet, Take 1 tablet by mouth 3 (three) times daily as needed., Disp: , Rfl:    levothyroxine  (SYNTHROID ) 50 MCG tablet, Take 50 mcg by mouth daily., Disp: , Rfl:    metoprolol  tartrate  (LOPRESSOR ) 25 MG tablet, TAKE (1/2) TABLET BY MOUTH TWICE DAILY., Disp: 90 tablet, Rfl: 1   Multiple Vitamin (MULTIVITAMIN WITH MINERALS) TABS tablet, Take 1 tablet by mouth daily., Disp: , Rfl:  pramipexole (MIRAPEX) 0.125 MG tablet, Take 0.125 mg by mouth 2 (two) times daily., Disp: , Rfl:    rOPINIRole (REQUIP) 0.5 MG tablet, Take 1 mg by mouth daily., Disp: , Rfl:

## 2024-05-24 NOTE — Patient Instructions (Signed)
 It was good to see you today.  I have ordered your CT scan I will let you know when I get these results.  I will see you back in 4 months.  Please do not hesitate to reach out if you have any questions or concerns prior to that.

## 2024-05-25 ENCOUNTER — Ambulatory Visit: Payer: Self-pay | Admitting: Gynecologic Oncology

## 2024-05-25 LAB — CA 125: Cancer Antigen (CA) 125: 20.8 U/mL (ref 0.0–38.1)

## 2024-05-27 NOTE — Telephone Encounter (Signed)
 Attempt to reach patient to relay result message from provider. Left voicemail requesting call back to [phone].

## 2024-05-27 NOTE — Telephone Encounter (Signed)
-----   Message from Comer Dollar, MD sent at 05/25/2024  2:51 PM EST ----- Could you please let the patient know her CA-125 is stable from where it has been? Also, her potassium is just a little high. Please encourage her to limit foods with high potassium. Thanks!

## 2024-05-27 NOTE — Telephone Encounter (Signed)
 Spoke with [name] who returned call from office. Relayed result message from Dr. Viktoria that patient's CA-125 is stable from where it has been. Also potassium is just a little high. Encouraged patient to limit her foods with high potassium. Pt verbalized understanding and thanked the office for calling.

## 2024-05-30 ENCOUNTER — Other Ambulatory Visit: Payer: Self-pay | Admitting: Family Medicine

## 2024-05-30 DIAGNOSIS — Z1231 Encounter for screening mammogram for malignant neoplasm of breast: Secondary | ICD-10-CM

## 2024-06-12 ENCOUNTER — Ambulatory Visit (HOSPITAL_BASED_OUTPATIENT_CLINIC_OR_DEPARTMENT_OTHER)
Admission: RE | Admit: 2024-06-12 | Discharge: 2024-06-12 | Disposition: A | Discharge status: Home / Self Care | Source: Ambulatory Visit | Attending: Gynecologic Oncology | Admitting: Gynecologic Oncology

## 2024-06-12 DIAGNOSIS — C801 Malignant (primary) neoplasm, unspecified: Secondary | ICD-10-CM | POA: Diagnosis present

## 2024-06-12 DIAGNOSIS — I700 Atherosclerosis of aorta: Secondary | ICD-10-CM | POA: Insufficient documentation

## 2024-06-12 DIAGNOSIS — C569 Malignant neoplasm of unspecified ovary: Secondary | ICD-10-CM | POA: Insufficient documentation

## 2024-06-12 DIAGNOSIS — K4020 Bilateral inguinal hernia, without obstruction or gangrene, not specified as recurrent: Secondary | ICD-10-CM | POA: Diagnosis not present

## 2024-06-12 DIAGNOSIS — R1909 Other intra-abdominal and pelvic swelling, mass and lump: Secondary | ICD-10-CM | POA: Diagnosis not present

## 2024-06-12 MED ORDER — IOHEXOL 300 MG/ML  SOLN
100.0000 mL | Freq: Once | INTRAMUSCULAR | Status: AC | PRN
  Administered 2024-06-12: 100 mL via INTRAVENOUS

## 2024-06-19 ENCOUNTER — Ambulatory Visit

## 2024-06-20 NOTE — Telephone Encounter (Signed)
-----   Message from Comer Dollar, MD sent at 06/19/2024  4:12 PM EST ----- Please let the patient know there has been a little growth of the areas that we are following.  If she is interested in discussing treatment, please have her let us  know so that we can help get her in to see Dr. Cloretta sooner than her mid-March appointment. Thanks!

## 2024-06-20 NOTE — Telephone Encounter (Signed)
 Spoke with patient and relayed message from Dr. Viktoria that there has been a little growth of the areas that we are following. If patient is interested in discussing treatment, please have her let us  know so that we can help get her in to see Dr. Cloretta sooner than her mid-March appointment.   Patient verbalized understanding and is asking: Does Dr. Viktoria recommend I get treatment?  And if so, how many?   Advised patient her message will be relayed to provider and the office will call back and we can set her up with an earlier appointment with Dr. Cloretta as well.  Pt thanked the office.

## 2024-06-24 ENCOUNTER — Ambulatory Visit

## 2024-07-04 ENCOUNTER — Ambulatory Visit

## 2024-07-30 ENCOUNTER — Other Ambulatory Visit

## 2024-07-30 ENCOUNTER — Ambulatory Visit: Admitting: Oncology

## 2024-09-27 ENCOUNTER — Inpatient Hospital Stay

## 2024-09-27 ENCOUNTER — Inpatient Hospital Stay: Admitting: Gynecologic Oncology
